# Patient Record
Sex: Female | Born: 1945 | Race: White | Hispanic: No | Marital: Married | Smoking: Former smoker
Health system: Southern US, Community
[De-identification: ages and names within clinical notes are randomized; demographics above are authoritative.]

## PROBLEM LIST (undated history)

## (undated) DIAGNOSIS — I10 Essential (primary) hypertension: Secondary | ICD-10-CM

## (undated) DIAGNOSIS — K5900 Constipation, unspecified: Secondary | ICD-10-CM

## (undated) DIAGNOSIS — T8859XA Other complications of anesthesia, initial encounter: Secondary | ICD-10-CM

## (undated) DIAGNOSIS — T4145XA Adverse effect of unspecified anesthetic, initial encounter: Secondary | ICD-10-CM

## (undated) DIAGNOSIS — R630 Anorexia: Secondary | ICD-10-CM

## (undated) DIAGNOSIS — K469 Unspecified abdominal hernia without obstruction or gangrene: Secondary | ICD-10-CM

## (undated) DIAGNOSIS — M1990 Unspecified osteoarthritis, unspecified site: Secondary | ICD-10-CM

## (undated) DIAGNOSIS — M62838 Other muscle spasm: Secondary | ICD-10-CM

## (undated) DIAGNOSIS — K219 Gastro-esophageal reflux disease without esophagitis: Secondary | ICD-10-CM

## (undated) DIAGNOSIS — R350 Frequency of micturition: Secondary | ICD-10-CM

## (undated) DIAGNOSIS — R51 Headache: Secondary | ICD-10-CM

## (undated) DIAGNOSIS — E039 Hypothyroidism, unspecified: Secondary | ICD-10-CM

## (undated) DIAGNOSIS — C801 Malignant (primary) neoplasm, unspecified: Secondary | ICD-10-CM

## (undated) DIAGNOSIS — Z87442 Personal history of urinary calculi: Secondary | ICD-10-CM

## (undated) DIAGNOSIS — L539 Erythematous condition, unspecified: Secondary | ICD-10-CM

## (undated) DIAGNOSIS — Z974 Presence of external hearing-aid: Secondary | ICD-10-CM

## (undated) HISTORY — DX: Malignant (primary) neoplasm, unspecified: C80.1

## (undated) HISTORY — DX: Other muscle spasm: M62.838

## (undated) HISTORY — PX: CHOLECYSTECTOMY: SHX55

## (undated) HISTORY — PX: COLONOSCOPY W/ POLYPECTOMY: SHX1380

## (undated) HISTORY — PX: HERNIA REPAIR: SHX51

## (undated) HISTORY — DX: Anorexia: R63.0

## (undated) HISTORY — DX: Essential (primary) hypertension: I10

## (undated) HISTORY — DX: Frequency of micturition: R35.0

## (undated) HISTORY — PX: APPENDECTOMY: SHX54

## (undated) HISTORY — DX: Unspecified osteoarthritis, unspecified site: M19.90

## (undated) HISTORY — DX: Erythematous condition, unspecified: L53.9

## (undated) HISTORY — PX: TONSILLECTOMY: SUR1361

## (undated) HISTORY — DX: Unspecified abdominal hernia without obstruction or gangrene: K46.9

## (undated) HISTORY — DX: Presence of external hearing-aid: Z97.4

## (undated) HISTORY — PX: TOE SURGERY: SHX1073

## (undated) HISTORY — PX: ABDOMINAL HYSTERECTOMY: SHX81

---

## 2004-08-27 ENCOUNTER — Emergency Department (HOSPITAL_COMMUNITY): Admission: EM | Admit: 2004-08-27 | Discharge: 2004-08-27 | Payer: Self-pay | Admitting: Emergency Medicine

## 2009-01-14 ENCOUNTER — Encounter: Admission: RE | Admit: 2009-01-14 | Discharge: 2009-03-25 | Payer: Self-pay | Admitting: Rheumatology

## 2009-05-07 ENCOUNTER — Emergency Department (HOSPITAL_COMMUNITY): Admission: EM | Admit: 2009-05-07 | Discharge: 2009-05-07 | Payer: Self-pay | Admitting: Emergency Medicine

## 2010-06-16 LAB — CBC
HCT: 46 % (ref 36.0–46.0)
Hemoglobin: 15.5 g/dL — ABNORMAL HIGH (ref 12.0–15.0)
MCV: 92.7 fL (ref 78.0–100.0)

## 2010-06-16 LAB — LACTIC ACID, PLASMA: Lactic Acid, Venous: 2.5 mmol/L — ABNORMAL HIGH (ref 0.5–2.2)

## 2010-06-16 LAB — POCT I-STAT, CHEM 8
BUN: 12 mg/dL (ref 6–23)
Chloride: 103 mEq/L (ref 96–112)
HCT: 49 % — ABNORMAL HIGH (ref 36.0–46.0)

## 2010-06-16 LAB — DIFFERENTIAL
Basophils Relative: 0 % (ref 0–1)
Eosinophils Absolute: 0.3 10*3/uL (ref 0.0–0.7)
Eosinophils Relative: 2 % (ref 0–5)
Lymphocytes Relative: 21 % (ref 12–46)
Lymphs Abs: 2.6 10*3/uL (ref 0.7–4.0)
Neutro Abs: 9.2 10*3/uL — ABNORMAL HIGH (ref 1.7–7.7)

## 2010-08-30 ENCOUNTER — Emergency Department (HOSPITAL_COMMUNITY): Payer: Medicare Other

## 2010-08-30 ENCOUNTER — Emergency Department (HOSPITAL_COMMUNITY)
Admission: EM | Admit: 2010-08-30 | Discharge: 2010-08-30 | Disposition: A | Discharge status: Home / Self Care | Payer: Medicare Other | Attending: Emergency Medicine | Admitting: Emergency Medicine

## 2010-08-30 DIAGNOSIS — R112 Nausea with vomiting, unspecified: Secondary | ICD-10-CM | POA: Insufficient documentation

## 2010-08-30 DIAGNOSIS — K439 Ventral hernia without obstruction or gangrene: Secondary | ICD-10-CM | POA: Insufficient documentation

## 2010-08-30 DIAGNOSIS — Z8543 Personal history of malignant neoplasm of ovary: Secondary | ICD-10-CM | POA: Insufficient documentation

## 2010-08-30 DIAGNOSIS — Z90710 Acquired absence of both cervix and uterus: Secondary | ICD-10-CM | POA: Insufficient documentation

## 2010-08-30 DIAGNOSIS — R109 Unspecified abdominal pain: Secondary | ICD-10-CM | POA: Insufficient documentation

## 2010-08-30 DIAGNOSIS — I10 Essential (primary) hypertension: Secondary | ICD-10-CM | POA: Insufficient documentation

## 2010-08-30 DIAGNOSIS — Z9089 Acquired absence of other organs: Secondary | ICD-10-CM | POA: Insufficient documentation

## 2010-08-31 ENCOUNTER — Emergency Department (HOSPITAL_COMMUNITY): Payer: Medicare Other

## 2010-08-31 ENCOUNTER — Inpatient Hospital Stay (HOSPITAL_COMMUNITY)
Admission: EM | Admit: 2010-08-31 | Discharge: 2010-09-03 | DRG: 395 | Disposition: A | Discharge status: Home / Self Care | Payer: Medicare Other | Attending: General Surgery | Admitting: General Surgery

## 2010-08-31 ENCOUNTER — Inpatient Hospital Stay (HOSPITAL_COMMUNITY): Payer: Medicare Other

## 2010-08-31 DIAGNOSIS — Z7982 Long term (current) use of aspirin: Secondary | ICD-10-CM

## 2010-08-31 DIAGNOSIS — K436 Other and unspecified ventral hernia with obstruction, without gangrene: Principal | ICD-10-CM | POA: Diagnosis present

## 2010-08-31 DIAGNOSIS — Z8543 Personal history of malignant neoplasm of ovary: Secondary | ICD-10-CM

## 2010-08-31 DIAGNOSIS — E039 Hypothyroidism, unspecified: Secondary | ICD-10-CM | POA: Diagnosis present

## 2010-08-31 DIAGNOSIS — D72829 Elevated white blood cell count, unspecified: Secondary | ICD-10-CM | POA: Diagnosis present

## 2010-08-31 DIAGNOSIS — M3500 Sicca syndrome, unspecified: Secondary | ICD-10-CM | POA: Diagnosis present

## 2010-08-31 DIAGNOSIS — I10 Essential (primary) hypertension: Secondary | ICD-10-CM | POA: Diagnosis present

## 2010-08-31 DIAGNOSIS — E119 Type 2 diabetes mellitus without complications: Secondary | ICD-10-CM | POA: Diagnosis present

## 2010-08-31 DIAGNOSIS — Z79899 Other long term (current) drug therapy: Secondary | ICD-10-CM

## 2010-08-31 LAB — BASIC METABOLIC PANEL
CO2: 32 mEq/L (ref 19–32)
Calcium: 9.5 mg/dL (ref 8.4–10.5)
Creatinine, Ser: 0.74 mg/dL (ref 0.4–1.2)
GFR calc non Af Amer: >60 mL/min (ref >60)
Glucose, Bld: 130 mg/dL — ABNORMAL HIGH (ref 70–99)
Potassium: 3.9 mEq/L (ref 3.5–5.1)

## 2010-08-31 LAB — DIFFERENTIAL
Basophils Absolute: 0 10*3/uL (ref 0.0–0.1)
Eosinophils Absolute: 0 10*3/uL (ref 0.0–0.7)
Eosinophils Relative: 0 % (ref 0–5)
Lymphocytes Relative: 19 % (ref 12–46)
Monocytes Absolute: 1 10*3/uL (ref 0.1–1.0)
Monocytes Relative: 7 % (ref 3–12)
Neutro Abs: 9.8 10*3/uL — ABNORMAL HIGH (ref 1.7–7.7)

## 2010-08-31 LAB — CBC
HCT: 46 % (ref 36.0–46.0)
Platelets: 216 10*3/uL (ref 150–400)
RBC: 5.23 MIL/uL — ABNORMAL HIGH (ref 3.87–5.11)

## 2010-08-31 MED ORDER — IOHEXOL 300 MG/ML  SOLN
100.0000 mL | Freq: Once | INTRAMUSCULAR | Status: AC | PRN
  Administered 2010-08-31: 100 mL via INTRAVENOUS

## 2010-09-01 LAB — GLUCOSE, CAPILLARY
Glucose-Capillary: 92 mg/dL (ref 70–99)
Glucose-Capillary: 83 mg/dL (ref 70–99)

## 2010-09-01 LAB — CBC
Hemoglobin: 14.7 g/dL (ref 12.0–15.0)
RBC: 4.74 MIL/uL (ref 3.87–5.11)
WBC: 11.2 10*3/uL — ABNORMAL HIGH (ref 4.0–10.5)

## 2010-09-01 LAB — BASIC METABOLIC PANEL
CO2: 31 mEq/L (ref 19–32)
Calcium: 8.3 mg/dL — ABNORMAL LOW (ref 8.4–10.5)
Chloride: 100 mEq/L (ref 96–112)
Creatinine, Ser: 0.51 mg/dL (ref 0.4–1.2)

## 2010-09-02 ENCOUNTER — Other Ambulatory Visit (HOSPITAL_COMMUNITY): Payer: Medicare Other

## 2010-09-02 LAB — GLUCOSE, CAPILLARY
Glucose-Capillary: 82 mg/dL (ref 70–99)
Glucose-Capillary: 93 mg/dL (ref 70–99)
Glucose-Capillary: 95 mg/dL (ref 70–99)

## 2010-09-03 LAB — GLUCOSE, CAPILLARY

## 2010-09-08 ENCOUNTER — Inpatient Hospital Stay (HOSPITAL_COMMUNITY)
Admission: RE | Admit: 2010-09-08 | Discharge: 2010-09-15 | DRG: 354 | Disposition: A | Discharge status: Home / Self Care | Payer: Medicare Other | Source: Ambulatory Visit | Attending: Surgery | Admitting: Surgery

## 2010-09-08 DIAGNOSIS — I2510 Atherosclerotic heart disease of native coronary artery without angina pectoris: Secondary | ICD-10-CM | POA: Diagnosis present

## 2010-09-08 DIAGNOSIS — E119 Type 2 diabetes mellitus without complications: Secondary | ICD-10-CM | POA: Diagnosis present

## 2010-09-08 DIAGNOSIS — K430 Incisional hernia with obstruction, without gangrene: Principal | ICD-10-CM | POA: Diagnosis present

## 2010-09-08 DIAGNOSIS — K560 Paralytic ileus: Secondary | ICD-10-CM | POA: Diagnosis not present

## 2010-09-08 DIAGNOSIS — Z452 Encounter for adjustment and management of vascular access device: Secondary | ICD-10-CM

## 2010-09-08 DIAGNOSIS — I10 Essential (primary) hypertension: Secondary | ICD-10-CM | POA: Diagnosis present

## 2010-09-08 DIAGNOSIS — I252 Old myocardial infarction: Secondary | ICD-10-CM

## 2010-09-08 DIAGNOSIS — Z8543 Personal history of malignant neoplasm of ovary: Secondary | ICD-10-CM

## 2010-09-08 LAB — GLUCOSE, CAPILLARY: Glucose-Capillary: 115 mg/dL — ABNORMAL HIGH (ref 70–99)

## 2010-09-08 LAB — COMPREHENSIVE METABOLIC PANEL
Alkaline Phosphatase: 62 U/L (ref 39–117)
BUN: 5 mg/dL — ABNORMAL LOW (ref 6–23)
CO2: 29 mEq/L (ref 19–32)
Chloride: 102 mEq/L (ref 96–112)
Glucose, Bld: 111 mg/dL — ABNORMAL HIGH (ref 70–99)
Potassium: 3.4 mEq/L — ABNORMAL LOW (ref 3.5–5.1)
Total Bilirubin: 1.2 mg/dL (ref 0.3–1.2)

## 2010-09-08 LAB — DIFFERENTIAL
Basophils Absolute: 0 10*3/uL (ref 0.0–0.1)
Lymphocytes Relative: 33 % (ref 12–46)
Monocytes Absolute: 0.7 10*3/uL (ref 0.1–1.0)
Neutro Abs: 3.7 10*3/uL (ref 1.7–7.7)

## 2010-09-08 LAB — CBC
HCT: 44.2 % (ref 36.0–46.0)
Hemoglobin: 14.9 g/dL (ref 12.0–15.0)
RBC: 4.91 MIL/uL (ref 3.87–5.11)
WBC: 7 10*3/uL (ref 4.0–10.5)

## 2010-09-08 LAB — SURGICAL PCR SCREEN: MRSA, PCR: NEGATIVE

## 2010-09-09 LAB — COMPREHENSIVE METABOLIC PANEL
ALT: 16 U/L (ref 0–35)
Albumin: 2.8 g/dL — ABNORMAL LOW (ref 3.5–5.2)
Alkaline Phosphatase: 48 U/L (ref 39–117)
BUN: 10 mg/dL (ref 6–23)
Chloride: 100 mEq/L (ref 96–112)
Potassium: 3.8 mEq/L (ref 3.5–5.1)
Sodium: 139 mEq/L (ref 135–145)
Total Bilirubin: 0.7 mg/dL (ref 0.3–1.2)

## 2010-09-09 LAB — GLUCOSE, CAPILLARY
Glucose-Capillary: 123 mg/dL — ABNORMAL HIGH (ref 70–99)
Glucose-Capillary: 124 mg/dL — ABNORMAL HIGH (ref 70–99)
Glucose-Capillary: 147 mg/dL — ABNORMAL HIGH (ref 70–99)

## 2010-09-09 LAB — CBC
HCT: 41.6 % (ref 36.0–46.0)
Hemoglobin: 13 g/dL (ref 12.0–15.0)
RDW: 15 % (ref 11.5–15.5)
WBC: 13 10*3/uL — ABNORMAL HIGH (ref 4.0–10.5)

## 2010-09-09 LAB — HEMOGLOBIN A1C
Hgb A1c MFr Bld: 6.3 % — ABNORMAL HIGH (ref <5.7)
Mean Plasma Glucose: 134 mg/dL — ABNORMAL HIGH (ref <117)

## 2010-09-09 NOTE — Op Note (Signed)
Stacy Kirk, Stacy Kirk            ACCOUNT NO.:  [id number]  MEDICAL RECORD NO.:  [id number]  LOCATION:  0003                         FACILITY:  [facility]  PHYSICIAN:   A. , M.D.DATE OF BIRTH:  07-10-1945  DATE OF PROCEDURE:  09/08/2010 DATE OF DISCHARGE:                              OPERATIVE REPORT   PREOPERATIVE DIAGNOSES: 1. 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 2. History of ovarian cancer with indwelling Port-A-Cath in right     subclavian region.  PROCEDURES: 1. Repair of complex recurrent incisional hernia with 20 cm x 20 cm     Strattice biologic mesh in an onlay position. 2. Myofascial advancement flap creation, total 20 cm. 3. Explantation of Port-A-Cath.  SURGEON:  Maisie Fus A. , MD  ASSISTANT:  Anselm Pancoast. Weatherly, MD  ESTIMATED BLOOD LOSS:  50 cc.  SPECIMENS:  None.  DRAINS:  19 round Blake drain to subcutaneous tissue under subcutaneous flaps.  INDICATIONS FOR PROCEDURE:  The patient is a 65 year old female who about 7 years ago had a laparotomy for ovarian cancer.  She has been disease free ever since.  She has developed multiple fascial defects and has been in and out of the emergency room with intermittent obstruction and incarceration of small bowel in these hernia defects.  She was last admitted to [facility] last week and this resolved nonoperatively.  Given her multiple history of hernias and incarcerations of these hernias with bowel obstruction, we recommend repair to her.  There are multiple fascial defects.  She is morbidly obese, had diabetes and has to take care of a son with disability who is an adult and there is a lot of lifting.  She is at high risk for recurrence, I told her that when I saw were given her multiple medical problems and her obesity.  Discussed options of laparoscopic versus open repair.  I felt an open repair would be the best for her given the fact that she had poor fascia and  multiple incisional defects as well as a history of an incarceration with bowel obstruction.  I discussed component separation techniques in order to reconstruct the midline and the use of a biological prosthesis given her poor tissue quality and that I felt a component separation would be best for her.  An open approach was also felt to be best given her large size of multiple fascial defects and poor tissue.  Risk of bleeding, infection, pulmonary complications of pneumonia, DVT, sepsis, intra- abdominal injury, colonic injury, small bowel injury, injury to stomach and other intra-abdominal organs were discussed as well as potential for bowel obstruction and recurrence of hernia even after repair.  I also discussed using a biological prosthesis as well with her upfront.  Did not feel laparoscopic was appropriate, I told her that.  She understood all the potential complications and wished to proceed.  She obtained medical clearance from Dr. Oneida Alar [facility].  Other complications, cardiovascular complications of stroke and myocardial infarction as well.  DESCRIPTION OF PROCEDURE:  The patient was brought to the operating room.  After induction of general anesthesia, a Foley catheter was placed.  Right subclavian region was then prepped and draped in a sterile  fashion.  Port-A-Cath located there.  After 3 minutes of allowing the prep to dry, incision was made into the old scar and the Port-A-Cath hub was identified.  It was grabbed and then the sutures were cut to release it from the chest wall.  We removed the entire Port- A-Cath without difficulty keeping my finger over the tract.  The port appeared to be removed in its entirety.  We then closed the wound with a layer of 3-0 Vicryl suture.  Dermabond was applied.  We then prepped and draped the abdomen separately.  Time-out was again done.  Midline incision was used.  Of note, a Foley catheter was placed and  preoperative antibiotics were also started.  We dissected down and a large hernia sac identified.  We entered abdominal cavity through the hernia sac.  She had significant intra-abdominal adhesions to the colon and small bowel into the hernia sac and there was a small adhesion that appeared to be responsible for obstruction.  We  took all this down with sharp dissection.  Once all adhesions were taken down, we noticed still some small bleeders along the antimesenteric border of the colon and oversewed these with sutures.  Hemostasis was excellent.  I then examined the abdominal wall.  She had multiple fascial defects noted down her entire incision.  I went ahead and felt that component separation would help me bring more of the abdominal wall back to the midline.  We then made skin flaps over the abdominal wall musculature all the way lateral as far as I could get, which was to the level of the just lateral to the anterior superior iliac spine.  Once we got this lateral, I preserved at least 1 perforator on each side to keep the skin alive.  I then used a cautery and made a slicing incision into the external oblique aponeurosis from basically the costal margin down to level of pubic symphysis.  This was on both sides.  This helped to release, I gained about 2 cm on each side, I was able pull the midline together.  I elected to go ahead and close this, it came together very easily with no tension using double-stranded #1 PDS.  Then I used a 20 x 20 cm piece of Strattice mesh in a diamond shape and then secured to the cut edge of the external oblique laterally and then took it superiorly above the old scar and secured it to what appeared be healthy fascia of the midline and did this again over the pubic symphysis as well.  This laid quite nicely.  I then irrigated out the subcutaneous tissues, used 2 separate stab incisions, 19 round Blake drains were placed.  This was secured to the skin  with 2-0 nylon.  We then approximated the subcutaneous fat with 3-0 Vicryl.  Skin staples were used to close the skin.  All final counts of sponge, needle, and instrument were found to be correct.  The patient was awoken, extubated, and taken to recovery room in satisfactory condition.      A. , M.D.     TAC/MEDQ  D:  09/08/2010  T:  09/08/2010  Job:  [id number]  cc:   Dr. [facility]  Electronically Signed by Harriette Bouillon M.D. on 09/09/2010 02:39:30 PM

## 2010-09-10 LAB — CBC
HCT: 39 % (ref 36.0–46.0)
Hemoglobin: 12.5 g/dL (ref 12.0–15.0)
MCH: 30.1 pg (ref 26.0–34.0)
MCV: 94 fL (ref 78.0–100.0)
RBC: 4.15 MIL/uL (ref 3.87–5.11)
WBC: 13.2 10*3/uL — ABNORMAL HIGH (ref 4.0–10.5)

## 2010-09-10 LAB — COMPREHENSIVE METABOLIC PANEL
BUN: 6 mg/dL (ref 6–23)
CO2: 36 mEq/L — ABNORMAL HIGH (ref 19–32)
Calcium: 9.1 mg/dL (ref 8.4–10.5)
Chloride: 99 mEq/L (ref 96–112)
Creatinine, Ser: <0.47 mg/dL — ABNORMAL LOW (ref 0.50–1.10)
Glucose, Bld: 144 mg/dL — ABNORMAL HIGH (ref 70–99)
Total Bilirubin: 1 mg/dL (ref 0.3–1.2)

## 2010-09-10 LAB — GLUCOSE, CAPILLARY
Glucose-Capillary: 142 mg/dL — ABNORMAL HIGH (ref 70–99)
Glucose-Capillary: 117 mg/dL — ABNORMAL HIGH (ref 70–99)

## 2010-09-11 LAB — GLUCOSE, CAPILLARY
Glucose-Capillary: 119 mg/dL — ABNORMAL HIGH (ref 70–99)
Glucose-Capillary: 123 mg/dL — ABNORMAL HIGH (ref 70–99)
Glucose-Capillary: 128 mg/dL — ABNORMAL HIGH (ref 70–99)
Glucose-Capillary: 129 mg/dL — ABNORMAL HIGH (ref 70–99)

## 2010-09-12 LAB — GLUCOSE, CAPILLARY
Glucose-Capillary: 126 mg/dL — ABNORMAL HIGH (ref 70–99)
Glucose-Capillary: 113 mg/dL — ABNORMAL HIGH (ref 70–99)
Glucose-Capillary: 114 mg/dL — ABNORMAL HIGH (ref 70–99)

## 2010-09-13 LAB — GLUCOSE, CAPILLARY
Glucose-Capillary: 120 mg/dL — ABNORMAL HIGH (ref 70–99)
Glucose-Capillary: 94 mg/dL (ref 70–99)

## 2010-09-14 LAB — CBC
HCT: 39.5 % (ref 36.0–46.0)
Platelets: 279 10*3/uL (ref 150–400)
RBC: 4.34 MIL/uL (ref 3.87–5.11)
RDW: 14.2 % (ref 11.5–15.5)
WBC: 6.1 10*3/uL (ref 4.0–10.5)

## 2010-09-14 LAB — GLUCOSE, CAPILLARY

## 2010-09-14 NOTE — Discharge Summary (Signed)
NAMECAITLEN, WORTH NO.:  [id number]  MEDICAL RECORD NO.:  [id number]  LOCATION:  5158                         FACILITY:  [facility]  PHYSICIAN:  Cherylynn Ridges, M.D.    DATE OF BIRTH:  Apr 07, 1945  DATE OF ADMISSION:  08/31/2010 DATE OF DISCHARGE:  09/03/2010                              DISCHARGE SUMMARY   ADMITTING PHYSICIAN:  Cherylynn Ridges, MD.  DISCHARGING PHYSICIAN:  Cherylynn Ridges, MD.  PRIMARY SURGEON:  Clovis Pu. Cornett, MD.  PRIMARY CARE PHYSICIAN:  Dr. Katrinka Blazing at [facility].  PROCEDURES:  None.  CONSULTANTS:  None.  REASON FOR ADMISSION:  Ms. Hoefling is a 65 year old female who has a known complex ventral hernia.  This was scheduled to be fixed this coming Wednesday.  However, the patient began having abdominal pain, nausea, and vomiting prior to this admission.  The patient was initially seen at [facility] where she had an abdominal x-ray, but was informed that was okay, and was sent home.  The x-ray did show some central bowel dilation.  When the patient returns home, her nausea and vomiting continued as well as her pain.  She presented back to [facility] emergency department where we were asked to see her.  A CT scan was obtained which revealed a partial obstruction of her complex ventral hernia.  At this time, the patient was admitted.  Please see admitting history and physical for further details.  ADMITTING DIAGNOSES: 1. Incarcerated likely strangulated complex ventral hernia with     possible compromise bowel. 2. Hypertension. 3. Leukocytosis. 4. Sjogren's syndrome. 5. History of ovarian cancer. 6. History of diabetes mellitus.  HOSPITAL COURSE:  At this time, the patient was admitted.  She was started on IV fluids as well as IV Invanz given the appearance of her abdomen, which included erythema surrounding her hernia as well as her tenderness and possibility of compromise bowel.  An NG tube was also placed for  decompression.  After the NG tube was placed, the patient did have some decompression, and by the following day, the patient was feeling much better.  She was passing gas at this time and her pain was much improved.  Her white blood cell count decreased to 11,200.  At this time, her NG tube was continued for further decompression.  The following day, the patient was continuing to feel better with still flatus.  Her abdomen was soft and her hernia was almost essentially reducible but was then pop back out.  Therefore, it was known that the patient's hernia was no longer completely incarcerated.  At this time because of decreased NG tube output, the patient's NG tube was clamped for 6 hours.  She did not have any nausea or vomiting with this and was then discontinued.  She was started on clear liquids and her diet was advanced as tolerated.  On June 8, the patient as long as she tolerates a regular low residue diet for lunch.  The patient may be discharged home with follow-up next week for her elective ventral hernia repair by Dr. Luisa Hart.  DISCHARGE DIAGNOSES: 1. Partially incarcerated complex ventral hernia resulting in a     partial small bowel  obstruction which has now resolved. 2. Diabetes mellitus. 3. Hypertension. 4. Hypothyroidism. 5. Sjogren's syndrome. 6. History of ovarian cancer.  DISCHARGE MEDICATIONS:  Please see medication reconciliation form.  DISCHARGE INSTRUCTIONS:  I will call the office and informed them they need to call the patient and give her further updated details on all of her preoperative information as she has apparently lost her preoperative packet.  Otherwise, I have informed the patient that she should continue with a normal softer low residue diet until the time of her surgery.  If she begins to have recurrent symptoms, she should give our office to call.     Letha Cape, PA   ______________________________ Cherylynn Ridges,  M.D.    KEO/MEDQ  D:  09/03/2010  T:  09/03/2010  Job:  [id number]  cc:   Dr. Jettie Pagan A. Cornett, M.D.  Electronically Signed by Barnetta Chapel PA on 09/09/2010 10:32:13 AM Electronically Signed by Jimmye Norman M.D. on 09/14/2010 08:16:31 AM

## 2010-09-14 NOTE — H&P (Signed)
[name], Kirk            ACCOUNT NO.:  [id number]  MEDICAL RECORD NO.:  [id number]  LOCATION:  5158                         FACILITY:  [facility]  PHYSICIAN:  Cherylynn Ridges, M.D.    DATE OF BIRTH:  March 18, 1946  DATE OF ADMISSION:  08/31/2010 DATE OF DISCHARGE:                             HISTORY & PHYSICAL   ADMITTING PHYSICIAN:  Marta Lamas. Lindie Spruce, MD  PRIMARY CARE PHYSICIAN:  Dr. Katrinka Blazing at [facility].  PRIMARY SURGEONS:  Maisie Fus A. Cornett, MD  CHIEF COMPLAINT:  Abdominal pain with nausea and vomiting.  HISTORY OF PRESENT ILLNESS:  Stacy Kirk is a 65 year old white female with a past medical history including hypertension, ovarian cancer, Sjogren syndrome, and diabetes mellitus.  However, type is somewhat unknown as the patient states she just got diagnosed with type 1 diabetes 2 years ago at the age of 62, but takes no treatment for it, and is adamant that she definitely does not have type 2 diabetes.  She does have a known complex ventral hernia.  It is likely incisional in nature after her laparotomy for her hysterectomy for ovarian cancer. The patient has seen Dr. Luisa Hart in the office last year as well as this year for the hernia.  She is actually scheduled for hernia repair next week.  However, Sunday night she awoke with severe abdominal pain, nausea, and vomiting.  Since then, she states she has essentially had intractable nausea and vomiting.  She went to the emergency department at [facility].  She had no labs completed and an abdominal x- ray.  This did show central small bowel dilatation, but no further workup was completed.  The patient was sent home and told to return for worsening nausea or vomiting or pain.  The patient's symptoms persisted. She called our office this morning who instructed her to come back to the emergency department, and we will evaluate her here.  Upon arrival, the patient complains of severe abdominal pain and  unrelenting nausea. She has an elevated white blood cell count of 13,500.  Currently, a CT scan is pending.  We will plan on admission for possible surgical repair.  REVIEW OF SYSTEMS:  Please see HPI, otherwise the patient admits to very little flatus, and her last bowel movement was on Sunday.  Otherwise, she admits to occasional short of breath with walking long distances, but no other dyspnea or chest pain.  All other systems have been reviewed and are negative.  FAMILY HISTORY:  Noncontributory.  PAST MEDICAL HISTORY: 1. Hypertension. 2. Ovarian cancer. 3. Diabetes mellitus, unsure whether this actually type 1 or type 2. 4. Sjogren syndrome.  PAST SURGICAL HISTORY: 1. Total abdominal hysterectomy. 2. Cholecystectomy.  SOCIAL HISTORY:  The patient is married with two children.  Her son is completely handicapped.  She denies any alcohol or tobacco, but she quit approximately 20 years ago.  ALLERGIES: 1. CODEINE 2. ASPIRIN. 3. ERYTHROMYCIN.  MEDICATIONS:  Currently unknown.  Her husband has her list of medications and he is at home.  PHYSICAL EXAMINATION:  GENERAL:  Stacy Kirk is a pleasant 65 year old obese white female who is currently lying in bed in mild-to-moderate distress secondary to pain and nausea.  VITAL SIGNS:  Temperature 98.3, pulse 73, respirations 18, blood pressure 155/76. HEENT:  Head is normocephalic, atraumatic.  Sclerae noninjected.  Pupils are equal, round, and reactive to light.  Ears and nose without any obvious masses or lesions.  No rhinorrhea.  Mouth is pink.  Throat shows no exudate. HEART:  Regular rate and rhythm.  Normal S1 and S2.  No murmurs, gallops, or rubs are noted.  She does have palpable carotid, radial, and pedal pulses bilaterally. LUNGS:  Clear to auscultation bilaterally with no wheezes, rhonchi, or rales noted.  Respiratory effort is nonlabored. ABDOMEN:  Soft, however, she is very tender over her incisional hernia. Bowel  is palpable.  She does have active bowel sounds.  There are erythematous changes located over this hernia.  No other masses are noted. MUSCULOSKELETAL:  All four extremities are symmetrical with no cyanosis, clubbing, or edema. SKIN:  Warm and dry with no mass lesions or rashes. PSYCH:  The patient is alert and oriented x3 with an appropriate affect.  LABORATORY DATA AND DIAGNOSTICS:  Sodium 132, potassium 3.9, glucose 130, BUN 18, creatinine 0.74.  White blood cell count is 13,500, hemoglobin 16.3, hematocrit 46, platelet count of 216,000.  CT scan of the abdomen and pelvis is pending.  Preoperative chest x-ray is pending.  IMPRESSION: 1. Incarcerated likely strangulated complex ventral hernia, possible     compromised bowel. 2. Hypertension. 3. Leukocytosis. 4. Sjogren syndrome. 5. History of ovarian cancer. 6. History of diabetes mellitus, type unknown.  PLAN:  We will get the patient admitted.  We will start on IV fluids as well as various p.r.n. medications as needed for pain and nausea.  Given her white blood cell count and early erythematous changes surrounding her hernia, we will also go ahead and start her on Invanz 1 g for the possibility of compromise bowel.  We will get a CT scan of the abdomen and pelvis to evaluate this hernia for further clarification.  I have explained to the patient that she may need emergent or urgent repair at this hospitalization, possibly even tonight given the CT scan findings. I have also discussed with the patient that she may very well likely need an NG tube placed for gastric decompression.  The patient understands all this.  In the meantime, we will await her diagnostic tests for further plan.  We will place her in a regular floor bed with a SCDs and hold on DVT prophylaxis in case she needs emergent surgical intervention.  We will get a preoperative EKG and chest x-ray as the patient does complain of some dyspnea on  exertion.     Letha Cape, PA   ______________________________ Cherylynn Ridges, M.D.    KEO/MEDQ  D:  08/31/2010  T:  09/01/2010  Job:  [id number]  cc:   [facility] Dr. Katrinka Blazing at [facility]  Electronically Signed by Barnetta Chapel PA on 09/01/2010 04:08:37 PM Electronically Signed by Jimmye Norman M.D. on 09/14/2010 08:10:14 AM

## 2010-09-15 LAB — GLUCOSE, CAPILLARY: Glucose-Capillary: 106 mg/dL — ABNORMAL HIGH (ref 70–99)

## 2010-09-24 ENCOUNTER — Ambulatory Visit (INDEPENDENT_AMBULATORY_CARE_PROVIDER_SITE_OTHER): Payer: Medicare Other | Admitting: Surgery

## 2010-09-24 ENCOUNTER — Encounter (INDEPENDENT_AMBULATORY_CARE_PROVIDER_SITE_OTHER): Payer: Self-pay | Admitting: Surgery

## 2010-09-24 DIAGNOSIS — K432 Incisional hernia without obstruction or gangrene: Secondary | ICD-10-CM

## 2010-09-24 NOTE — Progress Notes (Signed)
Subjective:     Patient ID: Stacy Kirk, female   DOB: [birth year], 65 y.o.   MRN: [id number]    There were no vitals taken for this visit.    HPI  The patient returns.  She is doing well.   Review of Systems negative     Objective:   Physical Exam  Incisions clean dry intact to abdomen.  Staples removed.  Small seroma.     Assessment:   Status post repair incisional hernia  Plan:   Follow up 2 weeks

## 2010-09-24 NOTE — Patient Instructions (Signed)
Return in two weeks.  

## 2010-09-30 ENCOUNTER — Telehealth (INDEPENDENT_AMBULATORY_CARE_PROVIDER_SITE_OTHER): Payer: Self-pay | Admitting: Surgery

## 2010-09-30 NOTE — Telephone Encounter (Signed)
I called pt's home # [phone] and message box is full.

## 2010-10-02 NOTE — Discharge Summary (Signed)
  NAMEETHELEAN, COLLA            ACCOUNT NO.:  [id number]  MEDICAL RECORD NO.:  [id number]  LOCATION:  1535                         FACILITY:  [facility]  PHYSICIAN:   A. , M.D.DATE OF BIRTH:  1945-10-25  DATE OF ADMISSION:  09/08/2010 DATE OF DISCHARGE:  09/15/2010                              DISCHARGE SUMMARY   ADMISSION DIAGNOSIS:  Complex ventral hernia with history of small-bowel obstruction.  DISCHARGE DIAGNOSES:  Complex ventral hernia with history of small-bowel obstruction.  PROCEDURE PERFORMED:  Repair of complex incisional hernia with component separation, biologic mesh.  BRIEF HISTORY:  The patient is a 65 year old female with a past history of ovarian cancer.  She was admitted due to a complex ventral hernias. She had multiple problems with obstruction and incarceration over the last 2 years.  HOSPITAL COURSE:  Please see operative note for details.  Ms. Ringel was placed in the stepdown unit on postop day #0.  Postop day #1 she was transferred to floor.  NG tube remained in place.  Bowel function returned the next 2 days and NG tube was removed.  She was on Entereg. Her wounds were clean, dry, intact.  JP drains had serous drainage.  She began to ambulate.  She had no fever or chills.  Her bowel function returned.  By postoperative day #7, she was tolerating regular diet, was ambulating well with good pain control with p.o. pain medications.  She was then discharged home in satisfactory condition.  DISCHARGE INSTRUCTIONS:  She will follow up in 1 to 2 weeks to have her staples removed.  Her JP drain will remain in place.  She will refrain from lifting, pushing, pulling and driving until she is seen back in our office in 1 to 2 weeks.  She will be sent home on Percocet 1 or 2 tablets q.4 p.r.n. pain and will resume her home medications as outlined in medical reconciliation list of discharge.  She will shower.  CONDITION ON DISCHARGE:   Improved.  DISPOSITION:  Discharged home.      A. , M.D.     TAC/MEDQ  D:  09/24/2010  T:  09/24/2010  Job:  [id number]  Electronically Signed by Harriette Bouillon M.D. on 10/02/2010 05:14:10 PM

## 2010-10-04 ENCOUNTER — Encounter (INDEPENDENT_AMBULATORY_CARE_PROVIDER_SITE_OTHER): Payer: Self-pay | Admitting: Surgery

## 2010-10-04 ENCOUNTER — Ambulatory Visit (INDEPENDENT_AMBULATORY_CARE_PROVIDER_SITE_OTHER): Payer: Medicare Other | Admitting: Surgery

## 2010-10-04 DIAGNOSIS — Z9889 Other specified postprocedural states: Secondary | ICD-10-CM

## 2010-10-04 NOTE — Progress Notes (Signed)
Subjective:     Patient ID: Stacy BARTOLOTTA, female   DOB: [birth year], 65 y.o.   MRN: [id number]    There were no vitals taken for this visit.    HPI The patient returns today for a wound check. She has had a small amount of skin separation along the lower aspect of her incision. She also reports some drainage from the incision. There is no history of fever or chills. Drainage screen.  Review of Systems negative    Objective:  Physical Exam  Constitutional: She appears well-developed and well-nourished.  Abdominal:       Wound with a small amount of separation inferiorly.  Some. minimal serous drainage noted.  Seroma noted.      Assessment:  Status post incisional hernia repair using component separation and biologic mesh with small amount of skin separation residual seromaPlan:  Return to clinic one week

## 2010-10-04 NOTE — Patient Instructions (Signed)
Follow up in 1 week.  Keep area clean with soap and water.  Add neosporin.  It may drain.  This is expected.

## 2010-10-08 ENCOUNTER — Encounter (INDEPENDENT_AMBULATORY_CARE_PROVIDER_SITE_OTHER): Payer: Medicare Other | Admitting: Surgery

## 2010-10-12 ENCOUNTER — Encounter (INDEPENDENT_AMBULATORY_CARE_PROVIDER_SITE_OTHER): Payer: Medicare Other | Admitting: Surgery

## 2010-10-13 ENCOUNTER — Ambulatory Visit (INDEPENDENT_AMBULATORY_CARE_PROVIDER_SITE_OTHER): Payer: Medicare Other | Admitting: General Surgery

## 2010-10-13 ENCOUNTER — Encounter (INDEPENDENT_AMBULATORY_CARE_PROVIDER_SITE_OTHER): Payer: Self-pay | Admitting: General Surgery

## 2010-10-13 VITALS — BP 144/92 | HR 76 | Temp 97.0°F | Ht 61.0 in | Wt 193.4 lb

## 2010-10-13 DIAGNOSIS — K645 Perianal venous thrombosis: Secondary | ICD-10-CM | POA: Insufficient documentation

## 2010-10-13 MED ORDER — HYDROCORTISONE 2.5 % RE CREA: TOPICAL_CREAM | Freq: Two times a day (BID) | RECTAL | Status: AC

## 2010-10-13 NOTE — Progress Notes (Signed)
Subjective:     Patient ID: Stacy Kirk, female   DOB: 1945/06/27, 65 y.o.   MRN: [id number]  HPI Pt complains of 2-3 day history of burning around anus and blood when she wipes.  She can feel "something down there" that is not normal.  She has seen some clots and dripping of blood into the toilet.  She has never had trouble with hemorrhoids before.  She has been having issues with constipation since she was an infant.  She takes Miralax, stool softeners, prunes/prune juice and oatmeal to have stools.  She drinks quite a bit of water.  She does not take fiber supplements.    Review of Systems Otherwise negative.  Doing well from hernia repair.  No fevers/chills. Tylenol only for pain.    Objective:   Physical Exam  Constitutional: She appears well-developed and well-nourished.  HENT:  Head: Normocephalic and atraumatic.  Mouth/Throat: Oropharynx is clear and moist. No oropharyngeal exudate.  Eyes: Conjunctivae are normal. Pupils are equal, round, and reactive to light. No scleral icterus.  Neck: Normal range of motion. Neck supple.  Cardiovascular: Normal rate.   Pulmonary/Chest: Effort normal. No respiratory distress.  Abdominal: There is tenderness.       seroma  Genitourinary: Rectal exam shows external hemorrhoid and internal hemorrhoid. Rectal exam shows no tenderness and anal tone normal.          Thrombosed hemorrhoid Internal hemorrhoids circumferential.  Musculoskeletal: Normal range of motion.  Skin: Skin is warm and dry. No rash noted. No erythema.  Psychiatric: She has a normal mood and affect. Her behavior is normal. Thought content normal.       Assessment:     Thrombosed external hemorrhoid    Plan:   No need to I&D clot since non tender. Warm water baths (sitz baths) 2 times/day. Stool softeners 2x/day Miralax daily Anusol cream Follow up prn.

## 2010-10-13 NOTE — Patient Instructions (Signed)
Warm water baths (sitz baths) 2 times/day. Stool softeners 2x/day Miralax daily Anusol cream Follow up prn.

## 2010-10-14 ENCOUNTER — Telehealth (INDEPENDENT_AMBULATORY_CARE_PROVIDER_SITE_OTHER): Payer: Self-pay | Admitting: Surgery

## 2010-11-04 ENCOUNTER — Encounter (INDEPENDENT_AMBULATORY_CARE_PROVIDER_SITE_OTHER): Payer: Self-pay | Admitting: Surgery

## 2010-11-04 ENCOUNTER — Other Ambulatory Visit (INDEPENDENT_AMBULATORY_CARE_PROVIDER_SITE_OTHER): Payer: Self-pay | Admitting: Surgery

## 2010-11-04 ENCOUNTER — Ambulatory Visit (INDEPENDENT_AMBULATORY_CARE_PROVIDER_SITE_OTHER): Payer: Medicare Other | Admitting: Surgery

## 2010-11-04 DIAGNOSIS — R109 Unspecified abdominal pain: Secondary | ICD-10-CM

## 2010-11-04 DIAGNOSIS — IMO0002 Reserved for concepts with insufficient information to code with codable children: Secondary | ICD-10-CM

## 2010-11-04 MED ORDER — DOXYCYCLINE HYCLATE 100 MG PO TABS: 100.0000 mg | ORAL_TABLET | Freq: Two times a day (BID) | ORAL | Status: AC

## 2010-11-04 NOTE — Progress Notes (Signed)
The patient returns today in followup. She is about 8 weeks out from a complex repair of complex recurrent incisional hernia with biologic mesh. She has missed at least one or 2 previous postop visits due to scheduling issues. She is not having much abdominal pain but hasd notice more redness around the incision. There is also fluid under the incision. Denies any fever or chills. She does have a poor appetite and suffers from constipation.  On examination her abdomen is soft. . There is some erythema along her lower abdomen that corresponds to the incision.  No fluctuance.  Fluid noted under the incision. This is not tender at all. Under sterile conditions I aspirated some fluid from the seroma and appeared to be serous sanguinous. No pus in this fluid. This was sent for culture. No evidence of recurrent hernia.  Impression: Status post repair of complex recurrent incisional hernia with biologic mesh and component separation with persistent postoperative seroma  Plan: I will send her for CT scan here in [place] further evaluation. She is having no significant pain which is encouraging. She does not have much of an appetite and is suffering from constipation to think explains her poor appetite. I've asked her to increase the amount of Dulcolax as she is taking to hopefully help her bowels move better. She is nausea or vomiting he does not appear to be obstructed. I will place her on doxycycline 100 mg p.o. b.i.d. The redness is not tender but could be secondary to local irritation. She will return after the CT scan is done in the next 7-10 days.

## 2010-11-04 NOTE — Patient Instructions (Signed)
You will be scheduled for a CT of the abdomen.  Try to increase dose of dulcolax to 2 tablets three times a day.  Will start doxycycline 100 mg by mouth  Twice a day. Cancel the CT that's already scheduled.

## 2010-11-07 LAB — WOUND CULTURE

## 2010-11-10 ENCOUNTER — Ambulatory Visit
Admission: RE | Admit: 2010-11-10 | Discharge: 2010-11-10 | Disposition: A | Discharge status: Home / Self Care | Payer: Medicare Other | Source: Ambulatory Visit | Attending: Surgery | Admitting: Surgery

## 2010-11-10 ENCOUNTER — Encounter (INDEPENDENT_AMBULATORY_CARE_PROVIDER_SITE_OTHER): Payer: Self-pay | Admitting: Surgery

## 2010-11-10 ENCOUNTER — Ambulatory Visit (INDEPENDENT_AMBULATORY_CARE_PROVIDER_SITE_OTHER): Payer: Medicare Other | Admitting: Surgery

## 2010-11-10 VITALS — Temp 96.7°F

## 2010-11-10 DIAGNOSIS — IMO0002 Reserved for concepts with insufficient information to code with codable children: Secondary | ICD-10-CM

## 2010-11-10 MED ORDER — IOHEXOL 300 MG/ML  SOLN: 100.0000 mL | Freq: Once | INTRAMUSCULAR | Status: AC | PRN

## 2010-11-10 MED ORDER — CIPROFLOXACIN HCL 500 MG PO TABS: 500.0000 mg | ORAL_TABLET | Freq: Two times a day (BID) | ORAL | Status: AC

## 2010-11-10 NOTE — Progress Notes (Signed)
The patient returns to clinic today. CT scan shows a large postoperative seroma. The fluid I drew from last week throughout a Staphylococcus aureus that appears pan sensitive except for penicillin. She is on doxycycline. She has  more redness along her incision. She denies any significant fever or chills.  The exam: The erythema around the incision is slightly worse than when I saw her last week. There is significant fluid noted the incision. I discussed placing a drain in the seroma collection today she is agreed to let me do that. I prepped the area with Betadine. One percent lidocaine was used for local anesthesia. A 1 cm incision was made just to the right incision at the level of the umbilicus. I used a hemostat to open the seroma cavity. Clear fluid returned. A 19 French Jackson-Pratt drain was placed without difficulty.  I. secured it  to the skin with 3-0 nylon.  Impression: Postoperative seroma growing Staphylococcus aureus after component separation ventral repair with biologic mesh status post placement of drain in office today  Plan: She is on doxycycline which this is sensitive to. I will have her finish the doxycycline and start cipro 500 mg po bid.  She will empty the drain daily and record the output.  Sehe will call if she develops fever or chills.

## 2010-11-10 NOTE — Patient Instructions (Signed)
Empty drain daily and record output.  Finish doxycycline then  start cipro 500 mg twice a day.  Ok to shower.  Call if your temperature is greater than 101.5 or severe myalgia.

## 2010-11-15 ENCOUNTER — Encounter (INDEPENDENT_AMBULATORY_CARE_PROVIDER_SITE_OTHER): Payer: Self-pay | Admitting: Surgery

## 2010-11-15 ENCOUNTER — Ambulatory Visit (INDEPENDENT_AMBULATORY_CARE_PROVIDER_SITE_OTHER): Payer: Medicare Other | Admitting: Surgery

## 2010-11-15 VITALS — BP 122/76 | HR 72 | Temp 96.7°F | Ht 61.0 in | Wt 182.4 lb

## 2010-11-15 DIAGNOSIS — IMO0002 Reserved for concepts with insufficient information to code with codable children: Secondary | ICD-10-CM

## 2010-11-15 DIAGNOSIS — N390 Urinary tract infection, site not specified: Secondary | ICD-10-CM

## 2010-11-15 NOTE — Progress Notes (Signed)
Subjective:     Patient ID: Stacy Kirk, female   DOB: 1945/06/23, 65 y.o.   MRN: [id number]  HPI  The patient comes today complaining of burning and urinary urgency. She notes that this is similar to urinary tract infections she's had the past. This is been going on for about a week. She thought maybe was related to the seroma at the hernia. She held off on mentioning anything until it was drained first. It is not better on the doxycycline.   The patient notes that since the drain was placed by Dr. Luisa Hart for the seroma last week, the redness has gone away.  The drainage output is gone down from over 100 mL today to around 25-50 mL a day. It is also thinner and less cloudy. She denies pain. She denies any drainage elsewhere along her incision. Overall she's feeling well. No diarrhea. She was struggling with tolerating the doxycycline on empty stomach. She takes it with a little bit of bread & ttat helps.   Review of Systems  Constitutional: Negative for fever, chills, diaphoresis, appetite change and fatigue.  HENT: Negative for ear pain, sore throat, trouble swallowing, neck pain and ear discharge.   Eyes: Negative for photophobia, discharge and visual disturbance.  Respiratory: Negative for cough, choking, chest tightness and shortness of breath.   Cardiovascular: Negative for chest pain and palpitations.  Gastrointestinal: Negative for nausea, vomiting, abdominal pain, diarrhea, constipation, anal bleeding and rectal pain.  Genitourinary: Positive for urgency, frequency and enuresis. Negative for decreased urine volume, vaginal bleeding, vaginal discharge, vaginal pain and pelvic pain.  Musculoskeletal: Negative for myalgias and gait problem.  Skin: Negative for color change, pallor and rash.  Neurological: Negative for dizziness, speech difficulty, weakness and numbness.  Hematological: Negative for adenopathy.  Psychiatric/Behavioral: Negative for confusion and agitation. The  patient is not nervous/anxious.        Objective:   Physical Exam  Constitutional: She appears well-developed and well-nourished. No distress.  HENT:  Head: Normocephalic.  Mouth/Throat: Oropharynx is clear and moist.  Eyes: Conjunctivae are normal. Pupils are equal, round, and reactive to light.  Pulmonary/Chest: Effort normal. No respiratory distress.  Abdominal: Soft. She exhibits no distension. There is no tenderness. There is no guarding.       Incision closed & healing well.  Drainage serosanguinous. 10mL.  No erythema       Assessment:     1. Urinary tract infection  2. Infected seroma status post drainage, improving.    Plan:     Start ciprofloxacin. She or he has a prescription at home. I gave her another one just in case. This should help resolve urinary tract infection. If it does not improve, then I would get urinalysis and urine culture recheck things.  Continue drain for now. Probably can remove later this week when she follows up with Dr. Luisa Hart in on Friday. No evidence of any new infection. Continue doxycycline as he recommends.  She expressed understanding and appreciation

## 2010-11-15 NOTE — Patient Instructions (Addendum)
TAKE CIPROFLOXACIN to treat the UTI  Continue Doxycycline for the seroma infection near the hernia repair   Urinary Tract Infection (UTI) Infections of the urinary tract can start in several places. A bladder infection (cystitis), a kidney infection (pyelonephritis), and a prostate infection (prostatitis) are different types of urinary tract infections. They usually get better if treated with medicines (antibiotics) that kill germs. Take all the medicine until it is gone. You or your child may feel better in a few days, but TAKE ALL MEDICINE or the infection may not respond and may become more difficult to treat. HOME CARE INSTRUCTIONS  Drink enough water and fluids to keep the urine clear or pale yellow. Cranberry juice is especially recommended, in addition to large amounts of water.   Avoid caffeine, tea, and carbonated beverages. They tend to irritate the bladder.   Alcohol may irritate the prostate.   Only take over-the-counter or prescription medicines for pain, discomfort, or fever as directed by your caregiver.  FINDING OUT THE RESULTS OF YOUR TEST Not all test results are available during your visit. If your or your child's test results are not back during the visit, make an appointment with your caregiver to find out the results. Do not assume everything is normal if you have not heard from your caregiver or the medical facility. It is important for you to follow up on all test results. TO PREVENT FURTHER INFECTIONS:  Empty the bladder often. Avoid holding urine for long periods of time.   After a bowel movement, women should cleanse from front to back. Use each tissue only once.   Empty the bladder before and after sexual intercourse.  SEEK MEDICAL CARE IF:  There is back pain.   You or your child has an oral temperature above 102F.   Your baby is older than 3 months with a rectal temperature of 100.5 F (38.1 C) or higher for more than 1 day.   Your or your child's  problems (symptoms) are no better in 3 days. Return sooner if you or your child is getting worse.  SEEK IMMEDIATE MEDICAL CARE IF:  There is severe back pain or lower abdominal pain.   You or your child develops chills.   You or your child has an oral temperature above 102F, not controlled by medicine.   Your baby is older than 3 months with a rectal temperature of 102 F (38.9 C) or higher.   Your baby is 90 months old or younger with a rectal temperature of 100.4 F (38 C) or higher.   There is nausea or vomiting.   There is continued burning or discomfort with urination.  MAKE SURE YOU:  Understand these instructions.   Will watch this condition.   Will get help right away if you or your child is not doing well or gets worse.  Document Released: 12/22/2004 Document Re-Released: 06/08/2009 [facility] Patient Information 2011 [place], [place].

## 2010-11-19 ENCOUNTER — Ambulatory Visit (INDEPENDENT_AMBULATORY_CARE_PROVIDER_SITE_OTHER): Payer: Medicare Other | Admitting: Surgery

## 2010-11-19 ENCOUNTER — Encounter (INDEPENDENT_AMBULATORY_CARE_PROVIDER_SITE_OTHER): Payer: Self-pay | Admitting: Surgery

## 2010-11-19 DIAGNOSIS — T8140XA Infection following a procedure, unspecified, initial encounter: Secondary | ICD-10-CM

## 2010-11-19 NOTE — Progress Notes (Signed)
Patient returns to clinic today. Is feeling better. He was seen 4 days ago for a urinary tract infection and placed on Cipro by Dr. Michaell Cowing. She continues to have drainage from her Jackson-Pratt drain. It is serous in nature measuring anywhere from 25-75 a day she does have some mid back pain bilaterally.  On exam: Abdominal wall with no erythema. JP draining serous in nature. The posterior wound is well-closed. Mild tenderness to palpation of the mid back.  Impression: Postop seroma infection after complex hernia repair using component separation and biologic mesh requiring postop drain placement  Urinary tract infection on Cipro  Plan: She will finish her antibiotics. I'll see her back in 2 weeks. She is improving.

## 2010-11-19 NOTE — Patient Instructions (Addendum)
Follow up in two weeks.  Drain care.  If drain comes out, call us.

## 2010-12-02 ENCOUNTER — Encounter (INDEPENDENT_AMBULATORY_CARE_PROVIDER_SITE_OTHER): Payer: Self-pay | Admitting: Surgery

## 2010-12-02 ENCOUNTER — Ambulatory Visit (INDEPENDENT_AMBULATORY_CARE_PROVIDER_SITE_OTHER): Payer: Medicare Other | Admitting: Surgery

## 2010-12-02 VITALS — BP 146/90 | HR 64 | Temp 96.8°F

## 2010-12-02 DIAGNOSIS — IMO0002 Reserved for concepts with insufficient information to code with codable children: Secondary | ICD-10-CM

## 2010-12-02 NOTE — Patient Instructions (Signed)
Light duty.  RTC next week.

## 2010-12-02 NOTE — Progress Notes (Signed)
Patient returns to clinic today. She is doing well.  Examination incision well-healed. JP drain in place with serous drainage. No redness. Impression status post repair of ventral hernia complicated  by seroma infection  Plan: Continue JP drainage. She's done antibiotics. I will see her back in one week.  Continue JP.  Light duty.

## 2010-12-22 ENCOUNTER — Encounter (INDEPENDENT_AMBULATORY_CARE_PROVIDER_SITE_OTHER): Payer: Self-pay | Admitting: Surgery

## 2010-12-22 ENCOUNTER — Ambulatory Visit (INDEPENDENT_AMBULATORY_CARE_PROVIDER_SITE_OTHER): Payer: Medicare Other | Admitting: Surgery

## 2010-12-22 VITALS — BP 152/90 | HR 80 | Temp 97.4°F | Resp 18 | Ht 61.0 in | Wt 182.0 lb

## 2010-12-22 DIAGNOSIS — IMO0002 Reserved for concepts with insufficient information to code with codable children: Secondary | ICD-10-CM

## 2010-12-22 NOTE — Progress Notes (Signed)
Subjective:     Patient ID: Stacy Kirk, female   DOB: [birth year], 65 y.o.   MRN: [id number]  HPI The patient presents in follow up of persistent seroma after ventral hernia repair.  Her drainage is less. I removed her drain today.  She has an area of soreness over upper abdomen.    Review of Systems  Constitutional: Negative.   HENT: Negative.   Respiratory: Positive for cough.   Gastrointestinal: Positive for abdominal pain.  Hematological: Negative.        Objective:   Physical Exam  Constitutional: She appears well-developed.  HENT:  Head: Normocephalic and atraumatic.  Abdominal:       JP REMOVED.  NO REDNESS DRAINAGE IS CLEAR.  sL REDNESS OVER RUQ.  Not fluctuant.  Itches. Suture below the skin here  Skin: Skin is warm and dry.       Assessment:     Seroma after ventral hernia repair    Plan:     Drain is out.  Follow up in 3-4 weeks. Binder.

## 2010-12-22 NOTE — Patient Instructions (Signed)
Follow up in 3-4 weeks.  Binder.

## 2011-01-14 ENCOUNTER — Encounter (INDEPENDENT_AMBULATORY_CARE_PROVIDER_SITE_OTHER): Payer: Medicare Other | Admitting: Surgery

## 2011-01-24 ENCOUNTER — Ambulatory Visit (INDEPENDENT_AMBULATORY_CARE_PROVIDER_SITE_OTHER): Payer: Medicare Other | Admitting: Surgery

## 2011-01-24 ENCOUNTER — Encounter (INDEPENDENT_AMBULATORY_CARE_PROVIDER_SITE_OTHER): Payer: Self-pay | Admitting: Surgery

## 2011-01-24 VITALS — BP 142/86 | HR 60 | Temp 96.9°F | Resp 20 | Ht 61.0 in | Wt 183.4 lb

## 2011-01-24 DIAGNOSIS — R1031 Right lower quadrant pain: Secondary | ICD-10-CM

## 2011-01-24 DIAGNOSIS — M545 Low back pain: Secondary | ICD-10-CM

## 2011-01-24 DIAGNOSIS — M25569 Pain in unspecified knee: Secondary | ICD-10-CM

## 2011-01-24 DIAGNOSIS — M25561 Pain in right knee: Secondary | ICD-10-CM | POA: Insufficient documentation

## 2011-01-24 DIAGNOSIS — R1903 Right lower quadrant abdominal swelling, mass and lump: Secondary | ICD-10-CM | POA: Insufficient documentation

## 2011-01-24 NOTE — Progress Notes (Signed)
Subjective:     Patient ID: Stacy Kirk, female   DOB: [birth year], 65 y.o.   MRN: [id number]  HPI  Patient Care Team: Eunice Blase as PCP - General (Internal Medicine) Clovis Pu. Cornett, MD as Consulting Physician (General Surgery)  This patient is a 65 y.o.female who presents today for surgical evaluation.   Procedure: Redo ventral her your care with component separation and biologic mesh 09/08/2010  Patient comes in today feeling a bulge in the right upper abdomen. She was worried the seroma had come back. Overall soreness around the incisions gone down. Just one spot in the right lower quadrant and occasionally pulls but is improved. She thinks that the numbness has spread from her incisions were upper abdomen. At a time she talks about the needle poking feeling.   No fevers chills or sweats. No nausea or vomiting. Her bowels move and regular. She's on exercising. She claims she can't do that or walk. She claims her low back pain. She complains about she twisted her right knee and then the hub labeled it with. She worries ureteric infection is not completely resolved. However she claims that ever time she called her primary care physician name director to the surgeon and that no one can see her until we clear her.  Past Medical History  Diagnosis Date  . Hypertension   . Hernia   . Cancer     ovarian  . Arthritis   . Osteoporosis   . Diabetes mellitus   . Hemorrhoids   . Loss of appetite   . Muscle spasm     back  . Redness     abd wound  . Urine frequency     unable to hold urine   . Wears hearing aid     bilateral     Past Surgical History  Procedure Date  . Toe surgery   . Cholecystectomy   . Abdominal hysterectomy   . Tonsillectomy   . Appendectomy   . Hernia repair [phone]    open component separation w biologic mesh (Dr. Luisa Hart)    History   Social History  . Marital Status: Married    Spouse Name: N/A    Number of Children: N/A  . Years of  Education: N/A   Occupational History  . Not on file.   Social History Main Topics  . Smoking status: Former Games developer  . Smokeless tobacco: Never Used  . Alcohol Use: No  . Drug Use: No  . Sexually Active: Not on file   Other Topics Concern  . Not on file   Social History Narrative  . No narrative on file    Family History  Problem Relation Age of Onset  . Diabetes Mother     Current outpatient prescriptions:aspirin 81 MG tablet, Take 81 mg by mouth daily.  , Disp: , Rfl: ;  CALCIUM-MAGNESIUM-VITAMIN D PO, Take by mouth daily.  , Disp: , Rfl: ;  Docusate Calcium (STOOL SOFTENER PO), Take by mouth daily.  , Disp: , Rfl: ;  fluticasone (FLONASE) 50 MCG/ACT nasal spray, as needed. , Disp: , Rfl: ;  levothyroxine (SYNTHROID, LEVOTHROID) 100 MCG tablet, Daily., Disp: , Rfl:  MELOXICAM PO, Take by mouth daily.  , Disp: , Rfl: ;  Multiple Vitamin (MULTIVITAMIN) capsule, Take 1 capsule by mouth daily.  , Disp: , Rfl: ;  Multiple Vitamins-Minerals (HAIR/SKIN/NAILS PO), Take by mouth daily.  , Disp: , Rfl: ;  Omega-3 Fatty Acids (FISH OIL PO), Take 2  tablets by mouth daily.  , Disp: , Rfl: ;  Probiotic Product (PROBIOTIC PO), Take by mouth daily.  , Disp: , Rfl:   Allergies  Allergen Reactions  . Erythromycin Other (See Comments)    "tears" her stomach up. Major digestive upset.  . Aspirin Itching  . Codeine Itching  . Tramadol     Dizzy        Review of Systems  Constitutional: Negative for fever, chills and diaphoresis.  HENT: Negative for ear pain, sore throat and trouble swallowing.   Eyes: Negative for photophobia and visual disturbance.  Respiratory: Negative for cough and choking.   Cardiovascular: Negative for chest pain and palpitations.  Gastrointestinal: Positive for abdominal pain. Negative for nausea, vomiting, diarrhea, constipation, blood in stool, abdominal distention, anal bleeding and rectal pain.  Genitourinary: Negative for dysuria, frequency, enuresis and  difficulty urinating.  Musculoskeletal: Positive for myalgias, back pain, arthralgias and gait problem.  Skin: Negative for color change, pallor and rash.  Neurological: Negative for dizziness, speech difficulty, weakness and numbness.  Hematological: Negative for adenopathy.  Psychiatric/Behavioral: Negative for confusion and agitation. The patient is not nervous/anxious.        Objective:   Physical Exam  Constitutional: She is oriented to person, place, and time. She appears well-developed and well-nourished. No distress.  HENT:  Head: Normocephalic.  Mouth/Throat: Oropharynx is clear and moist. No oropharyngeal exudate.  Eyes: Conjunctivae and EOM are normal. Pupils are equal, round, and reactive to light. No scleral icterus.  Neck: Normal range of motion. No tracheal deviation present.  Cardiovascular: Normal rate and intact distal pulses.   Pulmonary/Chest: Effort normal. No respiratory distress. She exhibits no tenderness.  Abdominal: Soft. She exhibits no distension and no mass. There is no tenderness. There is no rebound and no guarding. Hernia confirmed negative in the right inguinal area and confirmed negative in the left inguinal area.       Incisions clean with normal healing ridges.  No hernias.  Mild fullness RUQ, no mass.  No cellulitis.  Genitourinary: No vaginal discharge found.  Musculoskeletal: Normal range of motion. She exhibits no edema.       Moderate lumbar soreness.  Mild TTP L knee.  NL AROM.  No drawer sign.  Normal gait  Lymphadenopathy:       Right: No inguinal adenopathy present.       Left: No inguinal adenopathy present.  Neurological: She is alert and oriented to person, place, and time. No cranial nerve deficit. She exhibits normal muscle tone. Coordination normal.  Skin: Skin is warm and dry. No rash noted. She is not diaphoretic.  Psychiatric: She has a normal mood and affect. Her behavior is normal. Judgment and thought content normal.        Talkative but pleasant.  Not toxic       Assessment:     Over 4 months status post redo repair of complex ventral hernia the patient with multiple medical problems. Overall improved. No evidence of cellulitis, seroma, recurrent hernia.    Plan:     I think I was successful in reassuring the patient that she is making progress. I noted that abdominal pain is common with redo ventral hernia repairs. He can take up to 6-12 months to resolve. She can have chronic irritating areas for the rest of her life. Given her obesity and back problems and other joint problems and chronic pain issues; I am not surprised that she is now pain-free yet. I don't know  she ever will be, but it seems quite mild now & markedly improved.  She agrees..  I recommended that she discuss with her primary care physician the possibility of evaluation for her low back pain, right knee pain, persistent urinary issues. I see NO reason to delay this. She is cleared from a surgery standpoint to go back and address all her other numerous health issues.  It is reasonable for her to keep her long-term followup appt with Dr. Luisa Hart next month just to make sure she's making improvement. She feels reassured. She walked out without difficulty.

## 2011-01-24 NOTE — Patient Instructions (Signed)

## 2011-01-27 ENCOUNTER — Encounter (INDEPENDENT_AMBULATORY_CARE_PROVIDER_SITE_OTHER): Payer: Self-pay

## 2011-02-09 ENCOUNTER — Encounter (INDEPENDENT_AMBULATORY_CARE_PROVIDER_SITE_OTHER): Payer: Self-pay | Admitting: Surgery

## 2011-02-09 ENCOUNTER — Ambulatory Visit (INDEPENDENT_AMBULATORY_CARE_PROVIDER_SITE_OTHER): Payer: Medicare Other | Admitting: Surgery

## 2011-02-09 VITALS — BP 180/100 | HR 66 | Temp 97.5°F | Resp 12 | Wt 182.0 lb

## 2011-02-09 DIAGNOSIS — IMO0002 Reserved for concepts with insufficient information to code with codable children: Secondary | ICD-10-CM

## 2011-02-09 NOTE — Patient Instructions (Signed)
Return in two months

## 2011-02-09 NOTE — Progress Notes (Signed)
Subjective:     Patient ID: Stacy Kirk, female   DOB: 23-Oct-1945, 65 y.o.   MRN: [id number]  HPI Patient returns today in followup after complex ventral hernia repair in June 2012 with persistent postoperative seroma. She's a drainage tube placement in the past. She's here for routine followup. She does feel numb around the incision and feels fullness. She denies any fever or chills. She has had some urinary incontinence and dysuria off and on since surgery.   Review of Systems  Constitutional: Negative.   HENT: Negative.   Gastrointestinal: Positive for abdominal pain.  Genitourinary: Positive for dysuria.       Objective:   Physical Exam  Constitutional: She appears well-developed and well-nourished.  HENT:  Head: Normocephalic and atraumatic.  Eyes: EOM are normal. Pupils are equal, round, and reactive to light.  Neck: Normal range of motion. Neck supple.  Abdominal: Soft.       Incision well healed. Small seroma palpated. Ultrasound used which showed no delay or fluid in the operative bed of about 2 cm. Under sterile conditions this was aspirated and serous and was fluid was removed. Total of 10 cc could be extracted.  Skin: Skin is warm and dry.       Assessment:     Post op seroma    Plan:     Return to clinic in two months or sooner if issues arise.

## 2011-04-11 ENCOUNTER — Encounter (INDEPENDENT_AMBULATORY_CARE_PROVIDER_SITE_OTHER): Payer: Medicare Other | Admitting: Surgery

## 2011-05-02 ENCOUNTER — Encounter (INDEPENDENT_AMBULATORY_CARE_PROVIDER_SITE_OTHER): Payer: Self-pay | Admitting: Surgery

## 2011-05-02 ENCOUNTER — Ambulatory Visit (INDEPENDENT_AMBULATORY_CARE_PROVIDER_SITE_OTHER): Payer: Medicare Other | Admitting: Surgery

## 2011-05-02 VITALS — BP 150/92 | HR 68 | Temp 98.6°F | Resp 14 | Ht 60.0 in | Wt 180.0 lb

## 2011-05-02 DIAGNOSIS — Z8719 Personal history of other diseases of the digestive system: Secondary | ICD-10-CM

## 2011-05-02 NOTE — Progress Notes (Signed)
Subjective:     Patient ID: Stacy Kirk, female   DOB: [birth year], 66 y.o.   MRN: [id number]  HPIPatient returns in followup after repair of ventral hernia last fall. She is doing well.   Review of Systems  Constitutional: Negative for fever, chills and unexpected weight change.  HENT: Negative for hearing loss, congestion, sore throat, trouble swallowing and voice change.   Eyes: Negative for visual disturbance.  Respiratory: Negative for cough and wheezing.   Cardiovascular: Negative for chest pain, palpitations and leg swelling.  Gastrointestinal: Negative for nausea, vomiting, abdominal pain, diarrhea, constipation, blood in stool, abdominal distention and anal bleeding.  Genitourinary: Negative for hematuria, vaginal bleeding and difficulty urinating.  Musculoskeletal: Negative for arthralgias.  Skin: Negative for rash and wound.  Neurological: Negative for seizures, syncope and headaches.  Hematological: Negative for adenopathy. Does not bruise/bleed easily.  Psychiatric/Behavioral: Negative for confusion.       Objective:   Physical Exam  Constitutional: She appears well-developed and well-nourished.  Neck: Normal range of motion. Neck supple.  Abdominal:         Assessment:     History of complex ventral hernia repair and colostomy closure and postop seroma    Plan:     Return as needed.

## 2011-05-02 NOTE — Patient Instructions (Signed)
Follow up as needed

## 2012-12-25 ENCOUNTER — Encounter (INDEPENDENT_AMBULATORY_CARE_PROVIDER_SITE_OTHER): Payer: Self-pay

## 2013-07-15 IMAGING — CT CT ABD-PELV W/ CM
2 of 5 series · 14 of 42 positions shown, 18 images · IV contrast (READICAT/WATER & [ID] OMNI 300)
Comparison: 08/31/2010

BUN and creatinine were obtained on site at [HOSPITAL] at
[HOSPITAL].
Results:  BUN unavailable,  Creatinine 0.7 mg/dL.
CLINICAL DATA: Postop from abdominal hernia repair.  Follow-up
anterior abdominal wall seroma.  Increased erythema and imaging.
Abdominal pain and fever.

CT ABDOMEN AND PELVIS WITH CONTRAST
TECHNIQUE: Multidetector CT imaging of the abdomen and pelvis was
performed following the standard protocol during bolus
administration of intravenous contrast.
Contrast: 100 ml Mmnipaque-OWW and oral contrast

[Series 2: abdomen w/ · axial · 0.82mm/px · z∈[-289,+41]mm · 11 of 76 slices shown, 15 images]
[im 5/76  soft-tissue]
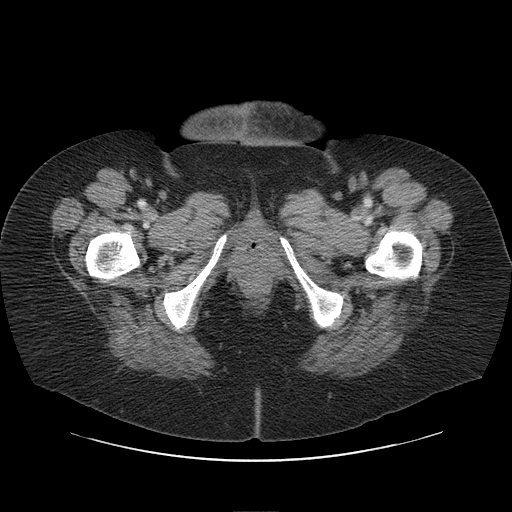
[im 5/76  bone]
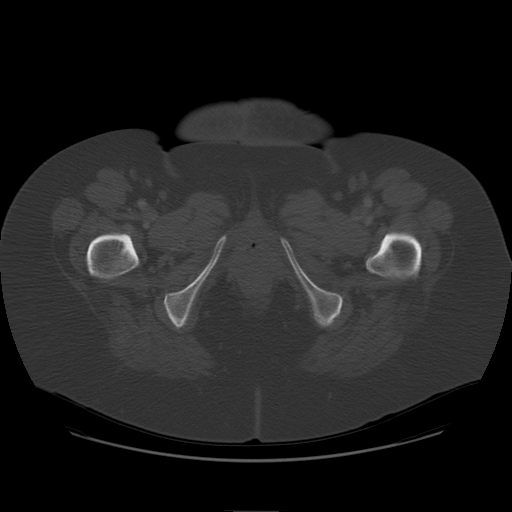
[im 13/76  soft-tissue]
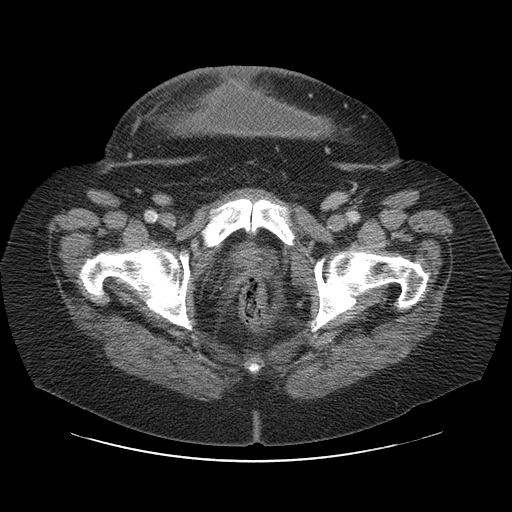
[im 21/76  soft-tissue]
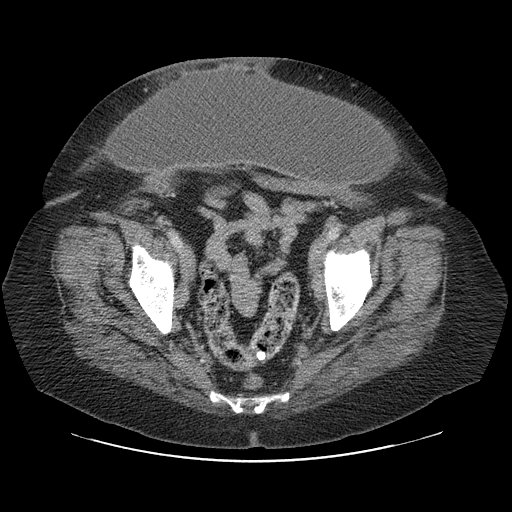
[im 30/76  soft-tissue]
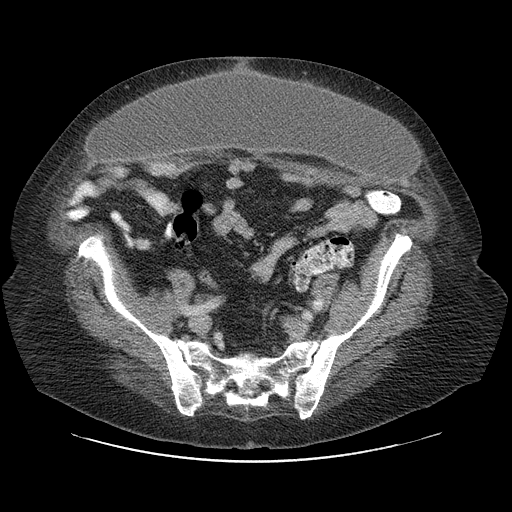
[im 38/76  soft-tissue]
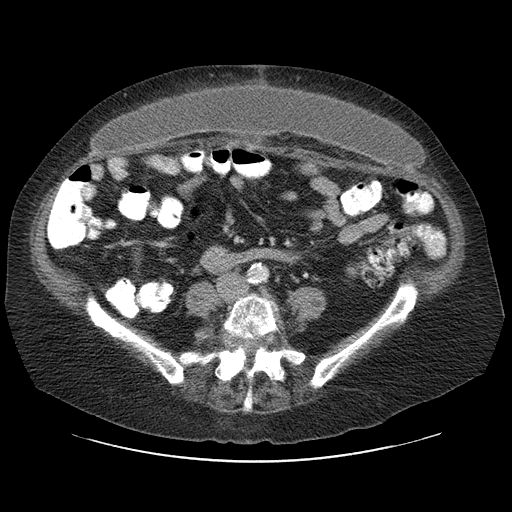
[im 46/76  soft-tissue]
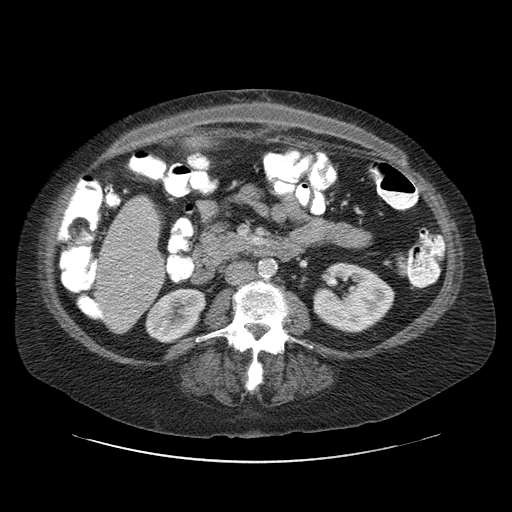
[im 55/76  soft-tissue]
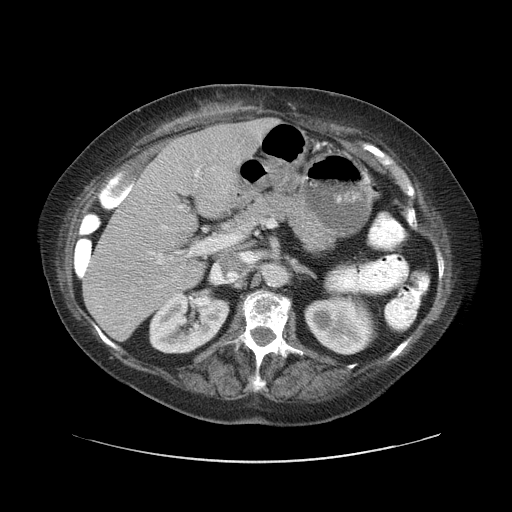
[im 59/76  lung]
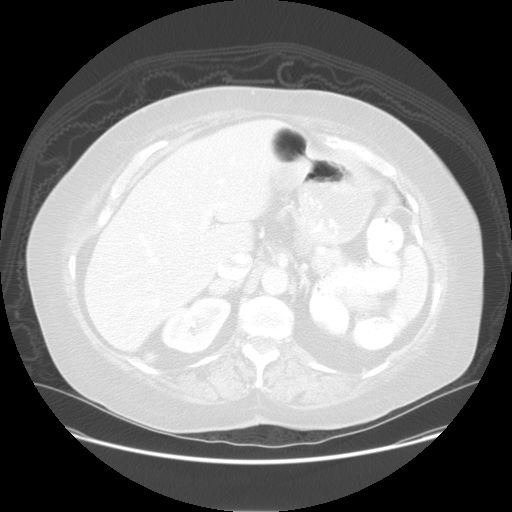
[im 63/76  soft-tissue]
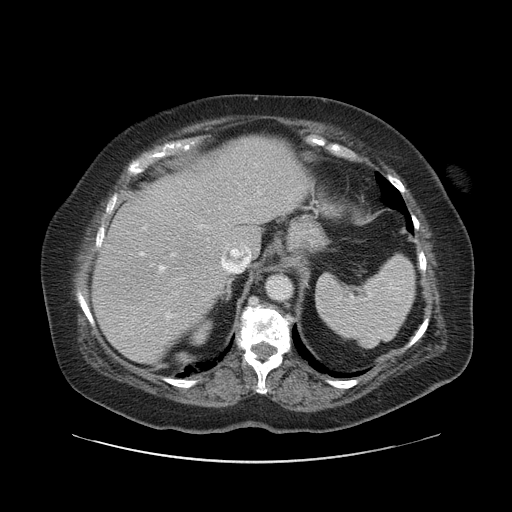
[im 63/76  lung]
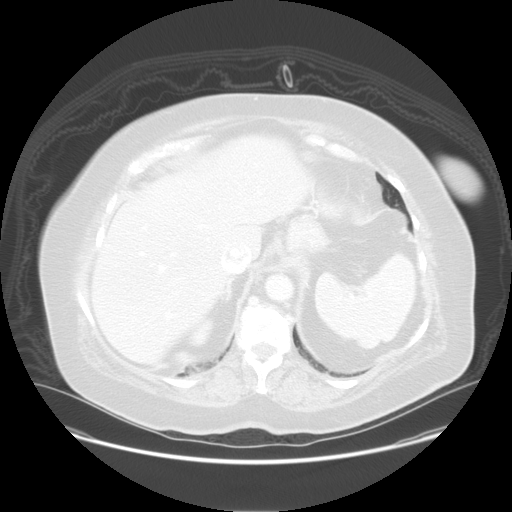
[im 67/76  lung]
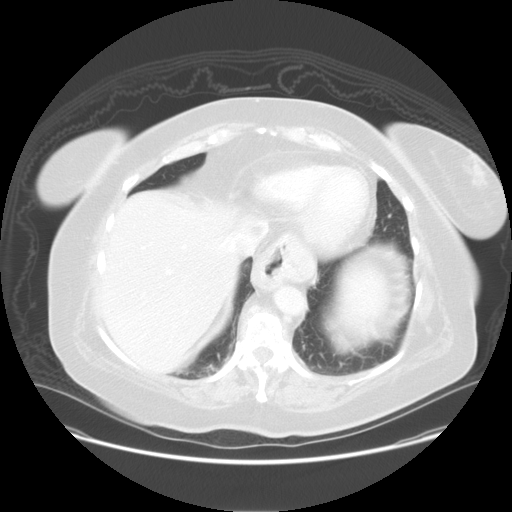
[im 71/76  soft-tissue]
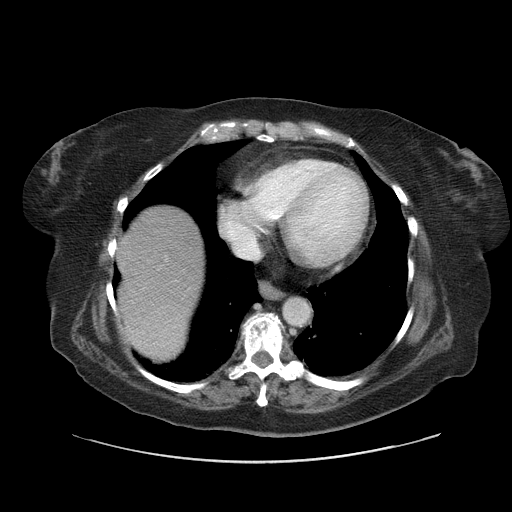
[im 71/76  lung]
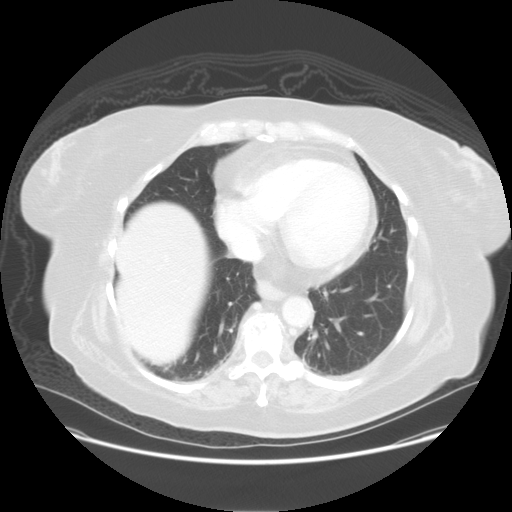
[im 71/76  bone]
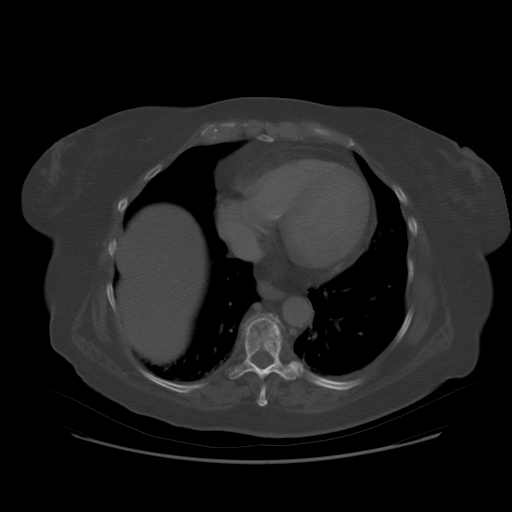

[Series 400: coronal · coronal · 0.82mm/px · 3 of 142 slices shown]
[im 36/142  soft-tissue]
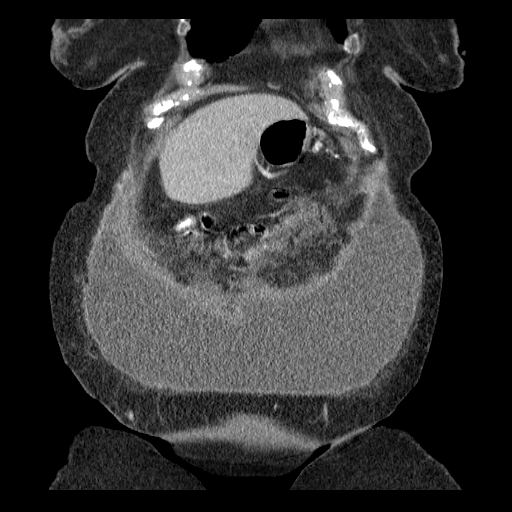
[im 71/142  soft-tissue]
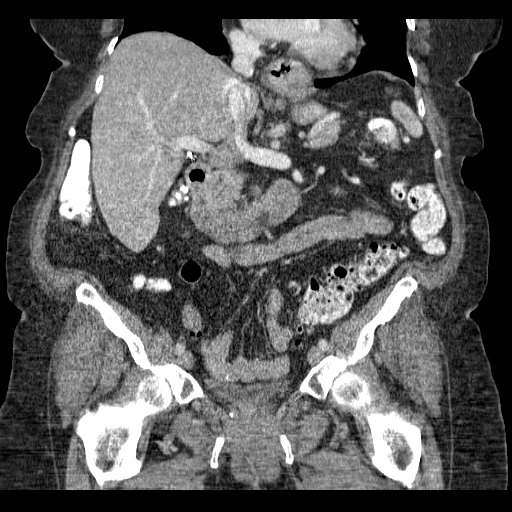
[im 106/142  soft-tissue]
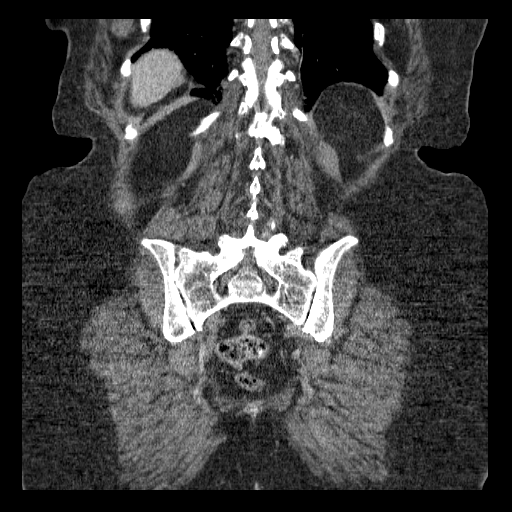

[14 of 42 positions shown; findings below may reference images not displayed]

FINDINGS: There has been interval surgical repair of previously
seen anterior abdominal wall hernias, and no residual or recurrent
hernia is identified.  There is a large rim enhancing fluid
collection in the anterior abdominal wall subcutaneous tissues
superficial to the surgical mesh.  This measures 7 x 26 cm, and
could represent a seroma or abscess.  No intra-abdominal or
intrapelvic fluid collections are seen in the intraperitoneal or
retroperitoneal spaces.  The

A small hiatal hernia is again seen.  Small left renal cyst is
stable.  The abdominal parenchymal organs are otherwise normal in
appearance.  A small right adrenal mass remains stable since prior
exam on 05/07/2009, consistent with a benign adrenal adenoma.  No
other soft tissue masses or lymphadenopathy identified within the
abdomen.  Shotty bilateral inguinal lymph nodes are seen which are
likely reactive in etiology given the recent surgery.  Prior
hysterectomy also noted.

Left colonic diverticulosis is noted, however there is no evidence
of acute inflammatory process.  No evidence of dilated bowel loops.
IMPRESSION: 1.  Large subcutaneous anterior abdominal wall fluid collection
measuring 7 x 26 cm, which could represent a postop seroma or
abscess.  Percutaneous catheter drainage should be considered.
2.  No evidence of recurrent ventral abdominal wall hernia.
3.  Stable incidental findings including small hiatal hernia,
benign right adrenal adenoma, and diverticulosis.

## 2015-05-12 DIAGNOSIS — R519 Headache, unspecified: Secondary | ICD-10-CM

## 2015-05-12 HISTORY — DX: Headache, unspecified: R51.9

## 2016-07-13 ENCOUNTER — Other Ambulatory Visit (HOSPITAL_BASED_OUTPATIENT_CLINIC_OR_DEPARTMENT_OTHER): Payer: Self-pay | Admitting: Neurosurgery

## 2016-07-13 DIAGNOSIS — M4807 Spinal stenosis, lumbosacral region: Secondary | ICD-10-CM

## 2016-07-23 ENCOUNTER — Ambulatory Visit (HOSPITAL_BASED_OUTPATIENT_CLINIC_OR_DEPARTMENT_OTHER)
Admission: RE | Admit: 2016-07-23 | Discharge: 2016-07-23 | Disposition: A | Discharge status: Home / Self Care | Payer: Medicare Other | Source: Ambulatory Visit | Attending: Neurosurgery | Admitting: Neurosurgery

## 2016-07-23 DIAGNOSIS — M48061 Spinal stenosis, lumbar region without neurogenic claudication: Secondary | ICD-10-CM | POA: Insufficient documentation

## 2016-07-23 DIAGNOSIS — M1288 Other specific arthropathies, not elsewhere classified, other specified site: Secondary | ICD-10-CM | POA: Diagnosis not present

## 2016-07-23 DIAGNOSIS — M4807 Spinal stenosis, lumbosacral region: Secondary | ICD-10-CM | POA: Insufficient documentation

## 2016-07-23 DIAGNOSIS — M5126 Other intervertebral disc displacement, lumbar region: Secondary | ICD-10-CM | POA: Diagnosis not present

## 2016-08-19 ENCOUNTER — Other Ambulatory Visit: Payer: Self-pay | Admitting: Neurosurgery

## 2016-09-06 ENCOUNTER — Encounter (HOSPITAL_COMMUNITY): Payer: Self-pay

## 2016-09-06 ENCOUNTER — Encounter (HOSPITAL_COMMUNITY)
Admission: RE | Admit: 2016-09-06 | Discharge: 2016-09-06 | Disposition: A | Discharge status: Home / Self Care | Payer: Medicare Other | Source: Ambulatory Visit | Attending: Neurosurgery | Admitting: Neurosurgery

## 2016-09-06 HISTORY — DX: Other complications of anesthesia, initial encounter: T88.59XA

## 2016-09-06 HISTORY — DX: Constipation, unspecified: K59.00

## 2016-09-06 HISTORY — DX: Headache: R51

## 2016-09-06 HISTORY — DX: Personal history of urinary calculi: Z87.442

## 2016-09-06 HISTORY — DX: Hypothyroidism, unspecified: E03.9

## 2016-09-06 HISTORY — DX: Gastro-esophageal reflux disease without esophagitis: K21.9

## 2016-09-06 HISTORY — DX: Adverse effect of unspecified anesthetic, initial encounter: T41.45XA

## 2016-09-06 LAB — CBC
HCT: 43.3 % (ref 36.0–46.0)
Hemoglobin: 14.2 g/dL (ref 12.0–15.0)
MCH: 30.3 pg (ref 26.0–34.0)
MCHC: 32.8 g/dL (ref 30.0–36.0)
MCV: 92.5 fL (ref 78.0–100.0)
Platelets: 228 10*3/uL (ref 150–400)
RBC: 4.68 MIL/uL (ref 3.87–5.11)
RDW: 14.3 % (ref 11.5–15.5)
WBC: 6.9 10*3/uL (ref 4.0–10.5)

## 2016-09-06 LAB — BASIC METABOLIC PANEL
Anion gap: 10 (ref 5–15)
BUN: 12 mg/dL (ref 6–20)
CALCIUM: 9.8 mg/dL (ref 8.9–10.3)
CO2: 29 mmol/L (ref 22–32)
Chloride: 94 mmol/L — ABNORMAL LOW (ref 101–111)
Creatinine, Ser: 0.74 mg/dL (ref 0.44–1.00)
GFR calc Af Amer: >60 mL/min (ref >60)
GFR calc non Af Amer: >60 mL/min (ref >60)
GLUCOSE: 104 mg/dL — AB (ref 65–99)
Potassium: 3.2 mmol/L — ABNORMAL LOW (ref 3.5–5.1)
Sodium: 133 mmol/L — ABNORMAL LOW (ref 135–145)

## 2016-09-06 LAB — TYPE AND SCREEN
ABO/RH(D): B POS
ANTIBODY SCREEN: NEGATIVE

## 2016-09-06 LAB — SURGICAL PCR SCREEN
MRSA, PCR: NEGATIVE
STAPHYLOCOCCUS AUREUS: NEGATIVE

## 2016-09-06 LAB — ABO/RH: ABO/RH(D): B POS

## 2016-09-06 LAB — GLUCOSE, CAPILLARY: GLUCOSE-CAPILLARY: 102 mg/dL — AB (ref 65–99)

## 2016-09-06 NOTE — Progress Notes (Signed)
Stacy Kirk has type II DM , she does not check CBGs, last A1C was 6.1 in February 2018.  I requested labs, any cardiac studies from [facility] in [place].  Patient had an EKG done in May 2018. Stacy Kirk reports that she is always constipated.  I explained to patient that it would be best ifshe would take something, so that she will not come for back fusion constipated, that the medications given post operative and change in diet and decreased activity, along with [name], could cause for severe constipation/problems.  Patient said she will go home and drink prune juice and eat prunes and hope it will work.

## 2016-09-06 NOTE — Pre-Procedure Instructions (Addendum)
Stacy Kirk  09/06/2016    Your procedure is scheduled on Wednesday, June 13  Report to [facility] Admitting at 6:30  A.M.               Your surgery or procedure is scheduled for 8:30 AM   Call this number if you have problems the morning of surgery: [phone]    Remember:  Do not eat food or drink liquids after midnight.  Take these medicines the morning of surgery with A SIP OF WATER :levothyroxine (SYNTHROID, LEVOTHROID).                Take if needed: acetaminophen (TYLENOL), baclofen (LIORESAL).                 STOP taking Aspirin, Aspirin Products (Goody Powder, Excedrin Migraine), Ibuprofen (Advil), Naproxen (Aleve), Vitamins and Herbal Products (ie Fish Oil)   How to Manage Your Diabetes Before and After Surgery  Why is it important to control my blood sugar before and after surgery? . Improving blood sugar levels before and after surgery helps healing and can limit problems. . A way of improving blood sugar control is eating a healthy diet by: o  Eating less sugar and carbohydrates o  Increasing activity/exercise o  Talking with your doctor about reaching your blood sugar goals . High blood sugars (greater than 180 mg/dL) can raise your risk of infections and slow your recovery, so you will need to focus on controlling your diabetes during the weeks before surgery. . Make sure that the doctor who takes care of your diabetes knows about your planned surgery including the date and location.  How do I manage my blood sugar before surgery? . Check your blood sugar at least 4 times a day, starting 2 days before surgery, to make sure that the level is not too high or low. o Check your blood sugar the morning of your surgery when you wake up and every 2 hours until you get to the Short Stay unit. . If your blood sugar is less than 70 mg/dL, you will need to treat for low blood sugar: o Do not take insulin. o Treat a low blood sugar (less than 70 mg/dL)  with  cup of clear juice (cranberry or apple), 4 glucose tablets, OR glucose gel. o Recheck blood sugar in 15 minutes after treatment (to make sure it is greater than 70 mg/dL). If your blood sugar is not greater than 70 mg/dL on recheck, call [phone] for further instructions. . Report your blood sugar to the short stay nurse when you get to Short Stay.  . If you are admitted to the hospital after surgery: o Your blood sugar will be checked by the staff and you will probably be given insulin after surgery (instead of oral diabetes medicines) to make sure you have good blood sugar levels. o The goal for blood sugar control after surgery is 80-180 mg/dL. WHAT DO I DO ABOUT MY DIABETES MEDICATIONS . Do not take oral diabetes medicines (pills) the morning of surgery.  Special Instructions:   [place]- Preparing For Surgery  Before surgery, you can play an important role. Because skin is not sterile, your skin needs to be as free of germs as possible. You can reduce the number of germs on your skin by washing with CHG (chlorahexidine gluconate) Soap before surgery.  CHG is an antiseptic cleaner which kills germs and bonds with the skin to continue killing germs  even after washing.  Please do not use if you have an allergy to CHG or antibacterial soaps. If your skin becomes reddened/irritated stop using the CHG.  Do not shave (including legs and underarms) for at least 48 hours prior to first CHG shower. It is OK to shave your face.  Please follow these instructions carefully.   1. Shower the NIGHT BEFORE SURGERY and the MORNING OF SURGERY with CHG.   2. If you chose to wash your hair, wash your hair first as usual with your normal shampoo.  3. After you shampoo, rinse your hair and body thoroughly to remove the shampoo. Wash your face and private area with your normal soap , rinse. 4. Use CHG as you would any other liquid soap. You can apply CHG directly to the skin and wash gently with  a scrungie or a clean washcloth.   5. Apply the CHG Soap to your body ONLY FROM THE NECK DOWN.  Do not use on open wounds or open sores. Avoid contact with your eyes, ears, mouth and genitals (private parts). Wash genitals (private parts) with your normal soap.  6. Wash thoroughly, paying special attention to the area where your surgery will be performed.  7. Thoroughly rinse your body with warm water from the neck down.  8. DO NOT shower/wash with your normal soap after using and rinsing off the CHG Soap.  9. Pat yourself dry with a CLEAN TOWEL.   10. Wear CLEAN PAJAMAS   11. Place CLEAN SHEETS on your bed the night of your first shower and DO NOT SLEEP WITH PETS.  Day of Surgery: Shower as above Do not apply any deodorants/lotions, powders, perfume. Please wear clean clothes to the hospital/surgery center.    Do not wear jewelry, make-up or nail polish.  Do not shave 48 hours prior to surgery.  Men may shave face and neck.  Do not bring valuables to the hospital.  [facility] is not responsible for any belongings or valuables.  Contacts, dentures or bridgework may not be worn into surgery.  Leave your suitcase in the car.  After surgery it may be brought to your room.  For patients admitted to the hospital, discharge time will be determined by your treatment team.   Please read over the following fact sheets that you were given: Patient Instructions for Mupirocin Application, Pain Booklet, Coughing and Deep Breathing, Surgical Site Infections.    Patient Signature:  Date:  Nurse Signature:                                          Date:

## 2016-09-06 NOTE — Progress Notes (Signed)
Anesthesia Chart Review: Patient is a 71 year old female scheduled for L4-5 PLIF on 09/07/16 by Dr. Saintclair Halsted. (PAT was at 2 PM on 09/06/16 due to our scheduler not being able to get in touch with patient.)  History includes former smoker, hypertension, diabetes mellitus type 2, hypothyroidism, GERD, ovarian cancer s/p hysterectomy, skin cancer, nephrolithiasis, osteoporosis, hearing aids, cholecystectomy, tonsillectomy, appendectomy, repair of complex recurrent incisional hernia with myofascial advancement flap creation 09/08/10, right [facility] Port-a-cath.   For anesthesia history, she reported being slow to awaken from anesthesia.   Meds include aspirin 81 mg, baclofen, HCTZ, levothyroxine, Linda S, magnesium gluconate, metformin, Toprol-XL, MVI.  She has multiple allergies/intolerances including tramadol (SOB), albumin (unsure reaction), ASA (lethary, itching), codeine (itching), cortisone (headaches), propoxyphene ("terrible reaction", headaches), simvastatin (severe muscle aches), triamcinolone acetonide (headaches), Erythromycin ("major" GI upset).  BP 138/88   Pulse 91   Temp 36.7 C   Resp 18   Ht 5\' 2"  (1.575 m)   Wt 187 lb (84.8 kg)   SpO2 96%   BMI 34.20 kg/m   EKG 08/17/16 [facility]): NSR, incomplete right BBB:  Preoperative labs noted. Na 133. K 3.2. Glucose 104. Cr 0.74. CBC WNL. A1c is pending, but was 6.1 on 05/06/16 [facility]).   Based on currently available information, I anticipate that she can proceed as planned if no acute changes.  George Hugh [facility] Short Stay Center/Anesthesiology Phone [phone] 09/06/2016 5:06 PM

## 2016-09-07 ENCOUNTER — Inpatient Hospital Stay (HOSPITAL_COMMUNITY): Payer: Medicare Other | Admitting: Vascular Surgery

## 2016-09-07 ENCOUNTER — Inpatient Hospital Stay (HOSPITAL_COMMUNITY): Payer: Medicare Other | Admitting: Anesthesiology

## 2016-09-07 ENCOUNTER — Encounter (HOSPITAL_COMMUNITY)
Admission: RE | Disposition: A | Discharge status: Home / Self Care | Payer: Self-pay | Source: Ambulatory Visit | Attending: Neurosurgery

## 2016-09-07 ENCOUNTER — Encounter (HOSPITAL_COMMUNITY): Payer: Self-pay | Admitting: Anesthesiology

## 2016-09-07 ENCOUNTER — Inpatient Hospital Stay (HOSPITAL_COMMUNITY)
Admission: RE | Admit: 2016-09-07 | Discharge: 2016-09-08 | DRG: 460 | Disposition: A | Discharge status: Home / Self Care | Payer: Medicare Other | Source: Ambulatory Visit | Attending: Neurosurgery | Admitting: Neurosurgery

## 2016-09-07 ENCOUNTER — Inpatient Hospital Stay (HOSPITAL_COMMUNITY): Payer: Medicare Other

## 2016-09-07 DIAGNOSIS — M4316 Spondylolisthesis, lumbar region: Secondary | ICD-10-CM | POA: Diagnosis present

## 2016-09-07 DIAGNOSIS — Z85828 Personal history of other malignant neoplasm of skin: Secondary | ICD-10-CM

## 2016-09-07 DIAGNOSIS — Z7984 Long term (current) use of oral hypoglycemic drugs: Secondary | ICD-10-CM

## 2016-09-07 DIAGNOSIS — Z974 Presence of external hearing-aid: Secondary | ICD-10-CM | POA: Diagnosis not present

## 2016-09-07 DIAGNOSIS — Z833 Family history of diabetes mellitus: Secondary | ICD-10-CM | POA: Diagnosis not present

## 2016-09-07 DIAGNOSIS — E119 Type 2 diabetes mellitus without complications: Secondary | ICD-10-CM | POA: Diagnosis present

## 2016-09-07 DIAGNOSIS — E039 Hypothyroidism, unspecified: Secondary | ICD-10-CM | POA: Diagnosis present

## 2016-09-07 DIAGNOSIS — Z886 Allergy status to analgesic agent status: Secondary | ICD-10-CM | POA: Diagnosis not present

## 2016-09-07 DIAGNOSIS — Z90710 Acquired absence of both cervix and uterus: Secondary | ICD-10-CM | POA: Diagnosis not present

## 2016-09-07 DIAGNOSIS — I10 Essential (primary) hypertension: Secondary | ICD-10-CM | POA: Diagnosis present

## 2016-09-07 DIAGNOSIS — Z885 Allergy status to narcotic agent status: Secondary | ICD-10-CM

## 2016-09-07 DIAGNOSIS — Z881 Allergy status to other antibiotic agents status: Secondary | ICD-10-CM

## 2016-09-07 DIAGNOSIS — Z888 Allergy status to other drugs, medicaments and biological substances status: Secondary | ICD-10-CM

## 2016-09-07 DIAGNOSIS — Z6834 Body mass index (BMI) 34.0-34.9, adult: Secondary | ICD-10-CM

## 2016-09-07 DIAGNOSIS — Z9049 Acquired absence of other specified parts of digestive tract: Secondary | ICD-10-CM

## 2016-09-07 DIAGNOSIS — Z419 Encounter for procedure for purposes other than remedying health state, unspecified: Secondary | ICD-10-CM

## 2016-09-07 DIAGNOSIS — M48062 Spinal stenosis, lumbar region with neurogenic claudication: Secondary | ICD-10-CM | POA: Diagnosis present

## 2016-09-07 DIAGNOSIS — Z87891 Personal history of nicotine dependence: Secondary | ICD-10-CM

## 2016-09-07 DIAGNOSIS — M5442 Lumbago with sciatica, left side: Secondary | ICD-10-CM

## 2016-09-07 DIAGNOSIS — Z7982 Long term (current) use of aspirin: Secondary | ICD-10-CM

## 2016-09-07 DIAGNOSIS — Z79899 Other long term (current) drug therapy: Secondary | ICD-10-CM

## 2016-09-07 DIAGNOSIS — M5441 Lumbago with sciatica, right side: Secondary | ICD-10-CM

## 2016-09-07 DIAGNOSIS — M48061 Spinal stenosis, lumbar region without neurogenic claudication: Secondary | ICD-10-CM | POA: Diagnosis present

## 2016-09-07 DIAGNOSIS — K219 Gastro-esophageal reflux disease without esophagitis: Secondary | ICD-10-CM | POA: Diagnosis present

## 2016-09-07 DIAGNOSIS — G8929 Other chronic pain: Secondary | ICD-10-CM

## 2016-09-07 DIAGNOSIS — M810 Age-related osteoporosis without current pathological fracture: Secondary | ICD-10-CM | POA: Diagnosis present

## 2016-09-07 LAB — HEMOGLOBIN A1C
HEMOGLOBIN A1C: 6 % — AB (ref 4.8–5.6)
Mean Plasma Glucose: 126 mg/dL

## 2016-09-07 LAB — GLUCOSE, CAPILLARY
GLUCOSE-CAPILLARY: 129 mg/dL — AB (ref 65–99)
Glucose-Capillary: 119 mg/dL — ABNORMAL HIGH (ref 65–99)
Glucose-Capillary: 136 mg/dL — ABNORMAL HIGH (ref 65–99)
Glucose-Capillary: 121 mg/dL — ABNORMAL HIGH (ref 65–99)

## 2016-09-07 SURGERY — POSTERIOR LUMBAR FUSION 1 LEVEL
Anesthesia: General | Site: Back

## 2016-09-07 MED ORDER — PHENYLEPHRINE HCL 10 MG/ML IJ SOLN
INTRAVENOUS | Status: DC | PRN
  Administered 2016-09-07: 20 ug/min via INTRAVENOUS

## 2016-09-07 MED ORDER — METOPROLOL SUCCINATE ER 25 MG PO TB24
25.0000 mg | ORAL_TABLET | Freq: Every evening | ORAL | Status: DC
  Administered 2016-09-07: 25 mg via ORAL
  Filled 2016-09-07 (×2): qty 1

## 2016-09-07 MED ORDER — LIDOCAINE HCL (CARDIAC) 20 MG/ML IV SOLN
INTRAVENOUS | Status: DC | PRN
  Administered 2016-09-07: 60 mg via INTRAVENOUS

## 2016-09-07 MED ORDER — PROPOFOL 10 MG/ML IV BOLUS
INTRAVENOUS | Status: DC | PRN
  Administered 2016-09-07: 140 mg via INTRAVENOUS

## 2016-09-07 MED ORDER — THROMBIN 20000 UNITS EX SOLR
CUTANEOUS | Status: AC
Start: 2016-09-07 — End: 2016-09-07
  Filled 2016-09-07: qty 20000

## 2016-09-07 MED ORDER — HYDROCORTISONE 1 % EX OINT
TOPICAL_OINTMENT | Freq: Every day | CUTANEOUS | Status: DC | PRN
  Filled 2016-09-07: qty 28.35

## 2016-09-07 MED ORDER — ACETAMINOPHEN 325 MG PO TABS
650.0000 mg | ORAL_TABLET | ORAL | Status: DC | PRN
  Administered 2016-09-08: 650 mg via ORAL
  Filled 2016-09-07 (×2): qty 2

## 2016-09-07 MED ORDER — ONDANSETRON HCL 4 MG/2ML IJ SOLN
4.0000 mg | Freq: Four times a day (QID) | INTRAMUSCULAR | Status: DC | PRN
  Administered 2016-09-07: 4 mg via INTRAVENOUS
  Filled 2016-09-07: qty 2

## 2016-09-07 MED ORDER — THROMBIN 20000 UNITS EX SOLR
CUTANEOUS | Status: DC | PRN
  Administered 2016-09-07: 20 mL via TOPICAL

## 2016-09-07 MED ORDER — ACETAMINOPHEN 500 MG PO TABS
1000.0000 mg | ORAL_TABLET | Freq: Four times a day (QID) | ORAL | Status: DC | PRN
  Administered 2016-09-07 – 2016-09-08 (×2): 1000 mg via ORAL
  Filled 2016-09-07 (×2): qty 2

## 2016-09-07 MED ORDER — HYDROMORPHONE HCL 1 MG/ML IJ SOLN
INTRAMUSCULAR | Status: AC
  Filled 2016-09-07: qty 0.5

## 2016-09-07 MED ORDER — VANCOMYCIN HCL 1000 MG IV SOLR
INTRAVENOUS | Status: AC
  Filled 2016-09-07: qty 1000

## 2016-09-07 MED ORDER — ONDANSETRON HCL 4 MG PO TABS: 4.0000 mg | ORAL_TABLET | Freq: Four times a day (QID) | ORAL | Status: DC | PRN

## 2016-09-07 MED ORDER — BUPIVACAINE LIPOSOME 1.3 % IJ SUSP
20.0000 mL | INTRAMUSCULAR | Status: AC
  Administered 2016-09-07: 20 mL
  Filled 2016-09-07: qty 20

## 2016-09-07 MED ORDER — PROPYLENE GLYCOL 0.6 % OP SOLN: 1.0000 [drp] | Freq: Three times a day (TID) | OPHTHALMIC | Status: DC | PRN

## 2016-09-07 MED ORDER — MENTHOL (TOPICAL ANALGESIC) 1.4 % EX PTCH: MEDICATED_PATCH | Freq: Four times a day (QID) | CUTANEOUS | Status: DC | PRN

## 2016-09-07 MED ORDER — FENTANYL CITRATE (PF) 100 MCG/2ML IJ SOLN
INTRAMUSCULAR | Status: DC | PRN
  Administered 2016-09-07 (×4): 50 ug via INTRAVENOUS

## 2016-09-07 MED ORDER — BACLOFEN 10 MG PO TABS
10.0000 mg | ORAL_TABLET | Freq: Every day | ORAL | Status: DC | PRN
  Filled 2016-09-07: qty 1

## 2016-09-07 MED ORDER — PROPOFOL 10 MG/ML IV BOLUS
INTRAVENOUS | Status: AC
  Filled 2016-09-07: qty 20

## 2016-09-07 MED ORDER — ONDANSETRON HCL 4 MG/2ML IJ SOLN
INTRAMUSCULAR | Status: DC | PRN
  Administered 2016-09-07: 4 mg via INTRAVENOUS

## 2016-09-07 MED ORDER — PHENYLEPHRINE HCL 10 MG/ML IJ SOLN
INTRAMUSCULAR | Status: DC | PRN
  Administered 2016-09-07 (×3): 80 ug via INTRAVENOUS

## 2016-09-07 MED ORDER — ARTIFICIAL TEARS OPHTHALMIC OINT
TOPICAL_OINTMENT | OPHTHALMIC | Status: AC
  Filled 2016-09-07: qty 3.5

## 2016-09-07 MED ORDER — 0.9 % SODIUM CHLORIDE (POUR BTL) OPTIME
TOPICAL | Status: DC | PRN
  Administered 2016-09-07 (×2): 1000 mL

## 2016-09-07 MED ORDER — LEVOTHYROXINE SODIUM 100 MCG PO TABS
100.0000 ug | ORAL_TABLET | Freq: Every day | ORAL | Status: DC
  Administered 2016-09-08: 100 ug via ORAL
  Filled 2016-09-07: qty 1

## 2016-09-07 MED ORDER — PANTOPRAZOLE SODIUM 40 MG IV SOLR
40.0000 mg | Freq: Every day | INTRAVENOUS | Status: DC
  Administered 2016-09-07: 40 mg via INTRAVENOUS
  Filled 2016-09-07: qty 40

## 2016-09-07 MED ORDER — ASPIRIN EC 81 MG PO TBEC
81.0000 mg | DELAYED_RELEASE_TABLET | Freq: Every evening | ORAL | Status: DC
  Administered 2016-09-07: 81 mg via ORAL
  Filled 2016-09-07 (×2): qty 1

## 2016-09-07 MED ORDER — CEFAZOLIN SODIUM-DEXTROSE 2-4 GM/100ML-% IV SOLN
2.0000 g | Freq: Three times a day (TID) | INTRAVENOUS | Status: AC
  Administered 2016-09-07 (×2): 2 g via INTRAVENOUS
  Filled 2016-09-07 (×2): qty 100

## 2016-09-07 MED ORDER — HYDROMORPHONE HCL 1 MG/ML IJ SOLN
0.2500 mg | INTRAMUSCULAR | Status: DC | PRN
  Administered 2016-09-07 (×4): 0.25 mg via INTRAVENOUS

## 2016-09-07 MED ORDER — ROCURONIUM BROMIDE 100 MG/10ML IV SOLN
INTRAVENOUS | Status: DC | PRN
  Administered 2016-09-07: 10 mg via INTRAVENOUS
  Administered 2016-09-07: 55 mg via INTRAVENOUS

## 2016-09-07 MED ORDER — ONDANSETRON HCL 4 MG/2ML IJ SOLN
INTRAMUSCULAR | Status: AC
  Filled 2016-09-07: qty 2

## 2016-09-07 MED ORDER — SODIUM CHLORIDE 0.9 % IR SOLN
Status: DC | PRN
  Administered 2016-09-07: 500 mL

## 2016-09-07 MED ORDER — DOCUSATE SODIUM 100 MG PO CAPS
100.0000 mg | ORAL_CAPSULE | Freq: Every day | ORAL | Status: DC
  Administered 2016-09-07: 100 mg via ORAL
  Filled 2016-09-07 (×2): qty 1

## 2016-09-07 MED ORDER — PHENOL 1.4 % MT LIQD: 1.0000 | OROMUCOSAL | Status: DC | PRN

## 2016-09-07 MED ORDER — HYPROMELLOSE (GONIOSCOPIC) 2.5 % OP SOLN
1.0000 [drp] | Freq: Three times a day (TID) | OPHTHALMIC | Status: DC | PRN
  Filled 2016-09-07: qty 15

## 2016-09-07 MED ORDER — CEFAZOLIN SODIUM-DEXTROSE 2-3 GM-% IV SOLR
2.0000 g | Freq: Once | INTRAVENOUS | Status: AC
  Administered 2016-09-07: 2 g via INTRAVENOUS

## 2016-09-07 MED ORDER — MENTHOL 3 MG MT LOZG: 1.0000 | LOZENGE | OROMUCOSAL | Status: DC | PRN

## 2016-09-07 MED ORDER — SUGAMMADEX SODIUM 200 MG/2ML IV SOLN
INTRAVENOUS | Status: AC
  Filled 2016-09-07: qty 2

## 2016-09-07 MED ORDER — ROCURONIUM BROMIDE 10 MG/ML (PF) SYRINGE
PREFILLED_SYRINGE | INTRAVENOUS | Status: AC
  Filled 2016-09-07: qty 5

## 2016-09-07 MED ORDER — HYDROMORPHONE HCL 1 MG/ML IJ SOLN: 0.5000 mg | INTRAMUSCULAR | Status: DC | PRN

## 2016-09-07 MED ORDER — ADULT MULTIVITAMIN W/MINERALS CH
1.0000 | ORAL_TABLET | Freq: Every day | ORAL | Status: DC
  Filled 2016-09-07: qty 1

## 2016-09-07 MED ORDER — METFORMIN HCL 500 MG PO TABS
500.0000 mg | ORAL_TABLET | Freq: Every day | ORAL | Status: DC
  Administered 2016-09-07: 500 mg via ORAL
  Filled 2016-09-07 (×2): qty 1

## 2016-09-07 MED ORDER — MAGNESIUM GLUCONATE 500 MG PO TABS
500.0000 mg | ORAL_TABLET | Freq: Every day | ORAL | Status: DC
  Administered 2016-09-08: 500 mg via ORAL
  Filled 2016-09-07 (×2): qty 1

## 2016-09-07 MED ORDER — MIDAZOLAM HCL 2 MG/2ML IJ SOLN
INTRAMUSCULAR | Status: AC
  Filled 2016-09-07: qty 2

## 2016-09-07 MED ORDER — MIDAZOLAM HCL 5 MG/5ML IJ SOLN
INTRAMUSCULAR | Status: DC | PRN
  Administered 2016-09-07: 2 mg via INTRAVENOUS

## 2016-09-07 MED ORDER — PHENYLEPHRINE 40 MCG/ML (10ML) SYRINGE FOR IV PUSH (FOR BLOOD PRESSURE SUPPORT)
PREFILLED_SYRINGE | INTRAVENOUS | Status: AC
  Filled 2016-09-07: qty 10

## 2016-09-07 MED ORDER — ALUM & MAG HYDROXIDE-SIMETH 200-200-20 MG/5ML PO SUSP: 30.0000 mL | Freq: Four times a day (QID) | ORAL | Status: DC | PRN

## 2016-09-07 MED ORDER — CYCLOBENZAPRINE HCL 10 MG PO TABS
10.0000 mg | ORAL_TABLET | Freq: Three times a day (TID) | ORAL | Status: DC | PRN
  Administered 2016-09-07: 10 mg via ORAL
  Filled 2016-09-07: qty 1

## 2016-09-07 MED ORDER — PROMETHAZINE HCL 25 MG/ML IJ SOLN: 6.2500 mg | INTRAMUSCULAR | Status: DC | PRN

## 2016-09-07 MED ORDER — HYDROCHLOROTHIAZIDE 25 MG PO TABS
25.0000 mg | ORAL_TABLET | Freq: Every day | ORAL | Status: DC
  Administered 2016-09-07 – 2016-09-08 (×2): 25 mg via ORAL
  Filled 2016-09-07 (×3): qty 1

## 2016-09-07 MED ORDER — ACETAMINOPHEN 650 MG RE SUPP: 650.0000 mg | RECTAL | Status: DC | PRN

## 2016-09-07 MED ORDER — LIDOCAINE-EPINEPHRINE 1 %-1:100000 IJ SOLN
INTRAMUSCULAR | Status: AC
Start: 2016-09-07 — End: 2016-09-07
  Filled 2016-09-07: qty 1

## 2016-09-07 MED ORDER — SODIUM CHLORIDE 0.9 % IV SOLN: 250.0000 mL | INTRAVENOUS | Status: DC

## 2016-09-07 MED ORDER — VANCOMYCIN HCL 1000 MG IV SOLR
INTRAVENOUS | Status: DC | PRN
  Administered 2016-09-07: 1000 mg via TOPICAL

## 2016-09-07 MED ORDER — OXYCODONE HCL 5 MG PO TABS
5.0000 mg | ORAL_TABLET | ORAL | Status: DC | PRN
  Filled 2016-09-07: qty 1

## 2016-09-07 MED ORDER — FENTANYL CITRATE (PF) 250 MCG/5ML IJ SOLN
INTRAMUSCULAR | Status: AC
  Filled 2016-09-07: qty 5

## 2016-09-07 MED ORDER — SODIUM CHLORIDE 0.9% FLUSH
3.0000 mL | Freq: Two times a day (BID) | INTRAVENOUS | Status: DC
  Administered 2016-09-07: 3 mL via INTRAVENOUS

## 2016-09-07 MED ORDER — THROMBIN 5000 UNITS EX SOLR
CUTANEOUS | Status: AC
  Filled 2016-09-07: qty 5000

## 2016-09-07 MED ORDER — LIDOCAINE 2% (20 MG/ML) 5 ML SYRINGE
INTRAMUSCULAR | Status: AC
  Filled 2016-09-07: qty 5

## 2016-09-07 MED ORDER — ARTIFICIAL TEARS OPHTHALMIC OINT
TOPICAL_OINTMENT | OPHTHALMIC | Status: DC | PRN
  Administered 2016-09-07: 1 via OPHTHALMIC

## 2016-09-07 MED ORDER — THROMBIN 5000 UNITS EX SOLR
OROMUCOSAL | Status: DC | PRN
  Administered 2016-09-07: 5 mL via TOPICAL

## 2016-09-07 MED ORDER — LIDOCAINE-EPINEPHRINE 1 %-1:100000 IJ SOLN
INTRAMUSCULAR | Status: DC | PRN
Start: 2016-09-07 — End: 2016-09-07
  Administered 2016-09-07: 10 mL

## 2016-09-07 MED ORDER — SUGAMMADEX SODIUM 200 MG/2ML IV SOLN
INTRAVENOUS | Status: DC | PRN
  Administered 2016-09-07: 169.6 mg via INTRAVENOUS

## 2016-09-07 MED ORDER — LACTATED RINGERS IV SOLN
INTRAVENOUS | Status: DC | PRN
  Administered 2016-09-07 (×2): via INTRAVENOUS

## 2016-09-07 MED ORDER — CEFAZOLIN SODIUM-DEXTROSE 2-4 GM/100ML-% IV SOLN
INTRAVENOUS | Status: AC
  Filled 2016-09-07: qty 100

## 2016-09-07 MED ORDER — SODIUM CHLORIDE 0.9% FLUSH: 3.0000 mL | INTRAVENOUS | Status: DC | PRN

## 2016-09-07 SURGICAL SUPPLY — 83 items
BAG DECANTER FOR FLEXI CONT (MISCELLANEOUS) ×3 IMPLANT
BENZOIN TINCTURE PRP APPL 2/3 (GAUZE/BANDAGES/DRESSINGS) ×3 IMPLANT
BLADE CLIPPER SURG (BLADE) IMPLANT
BLADE SURG 11 STRL SS (BLADE) ×3 IMPLANT
BONE VIVIGEN FORMABLE 5.4CC (Bone Implant) ×3 IMPLANT
BUR CUTTER 7.0 ROUND (BURR) ×3 IMPLANT
BUR MATCHSTICK NEURO 3.0 LAGG (BURR) ×3 IMPLANT
CANISTER SUCT 3000ML PPV (MISCELLANEOUS) ×3 IMPLANT
CAP LOCKING (Cap) ×8 IMPLANT
CAP LOCKING 5.5 CREO (Cap) ×4 IMPLANT
CARTRIDGE OIL MAESTRO DRILL (MISCELLANEOUS) ×1 IMPLANT
CLOSURE WOUND 1/2 X4 (GAUZE/BANDAGES/DRESSINGS) ×1
CONT SPEC 4OZ CLIKSEAL STRL BL (MISCELLANEOUS) ×3 IMPLANT
COVER BACK TABLE 60X90IN (DRAPES) ×3 IMPLANT
DECANTER SPIKE VIAL GLASS SM (MISCELLANEOUS) ×3 IMPLANT
DERMABOND ADVANCED (GAUZE/BANDAGES/DRESSINGS) ×2
DERMABOND ADVANCED .7 DNX12 (GAUZE/BANDAGES/DRESSINGS) ×1 IMPLANT
DIFFUSER DRILL AIR PNEUMATIC (MISCELLANEOUS) ×3 IMPLANT
DRAPE C-ARM 42X72 X-RAY (DRAPES) ×3 IMPLANT
DRAPE C-ARMOR (DRAPES) ×3 IMPLANT
DRAPE HALF SHEET 40X57 (DRAPES) IMPLANT
DRAPE LAPAROTOMY 100X72X124 (DRAPES) ×3 IMPLANT
DRAPE POUCH INSTRU U-SHP 10X18 (DRAPES) ×3 IMPLANT
DRAPE SURG 17X23 STRL (DRAPES) ×3 IMPLANT
DRSG OPSITE 4X5.5 SM (GAUZE/BANDAGES/DRESSINGS) ×3 IMPLANT
DRSG OPSITE POSTOP 4X6 (GAUZE/BANDAGES/DRESSINGS) ×3 IMPLANT
DURAPREP 26ML APPLICATOR (WOUND CARE) ×3 IMPLANT
ELECT REM PT RETURN 9FT ADLT (ELECTROSURGICAL) ×3
ELECTRODE REM PT RTRN 9FT ADLT (ELECTROSURGICAL) ×1 IMPLANT
EVACUATOR 3/16  PVC DRAIN (DRAIN) ×2
EVACUATOR 3/16 PVC DRAIN (DRAIN) ×1 IMPLANT
GAUZE SPONGE 4X4 12PLY STRL (GAUZE/BANDAGES/DRESSINGS) IMPLANT
GAUZE SPONGE 4X4 12PLY STRL LF (GAUZE/BANDAGES/DRESSINGS) ×3 IMPLANT
GAUZE SPONGE 4X4 16PLY XRAY LF (GAUZE/BANDAGES/DRESSINGS) IMPLANT
GLOVE BIO SURGEON STRL SZ7 (GLOVE) ×3 IMPLANT
GLOVE BIO SURGEON STRL SZ8 (GLOVE) ×9 IMPLANT
GLOVE BIOGEL PI IND STRL 7.0 (GLOVE) ×2 IMPLANT
GLOVE BIOGEL PI IND STRL 7.5 (GLOVE) ×1 IMPLANT
GLOVE BIOGEL PI INDICATOR 7.0 (GLOVE) ×4
GLOVE BIOGEL PI INDICATOR 7.5 (GLOVE) ×2
GLOVE ECLIPSE 7.5 STRL STRAW (GLOVE) IMPLANT
GLOVE EXAM NITRILE LRG STRL (GLOVE) IMPLANT
GLOVE EXAM NITRILE XL STR (GLOVE) IMPLANT
GLOVE EXAM NITRILE XS STR PU (GLOVE) IMPLANT
GLOVE INDICATOR 7.0 STRL GRN (GLOVE) ×9 IMPLANT
GLOVE INDICATOR 8.5 STRL (GLOVE) ×6 IMPLANT
GLOVE SURG SS PI 7.0 STRL IVOR (GLOVE) ×9 IMPLANT
GLOVE SURG SS PI 7.5 STRL IVOR (GLOVE) ×3 IMPLANT
GOWN STRL REUS W/ TWL LRG LVL3 (GOWN DISPOSABLE) ×3 IMPLANT
GOWN STRL REUS W/ TWL XL LVL3 (GOWN DISPOSABLE) ×3 IMPLANT
GOWN STRL REUS W/TWL 2XL LVL3 (GOWN DISPOSABLE) IMPLANT
GOWN STRL REUS W/TWL LRG LVL3 (GOWN DISPOSABLE) ×6
GOWN STRL REUS W/TWL XL LVL3 (GOWN DISPOSABLE) ×6
HEMOSTAT POWDER KIT SURGIFOAM (HEMOSTASIS) ×3 IMPLANT
KIT BASIN OR (CUSTOM PROCEDURE TRAY) ×3 IMPLANT
KIT INFUSE XX SMALL 0.7CC (Orthopedic Implant) ×3 IMPLANT
KIT ROOM TURNOVER OR (KITS) ×3 IMPLANT
MILL MEDIUM DISP (BLADE) ×3 IMPLANT
NEEDLE HYPO 21X1.5 SAFETY (NEEDLE) ×3 IMPLANT
NEEDLE HYPO 25X1 1.5 SAFETY (NEEDLE) ×3 IMPLANT
NS IRRIG 1000ML POUR BTL (IV SOLUTION) ×6 IMPLANT
OIL CARTRIDGE MAESTRO DRILL (MISCELLANEOUS) ×3
PACK LAMINECTOMY NEURO (CUSTOM PROCEDURE TRAY) ×3 IMPLANT
PAD ARMBOARD 7.5X6 YLW CONV (MISCELLANEOUS) ×9 IMPLANT
ROD SPINAL 35MM (Rod) ×6 IMPLANT
SCREW CORT CREO 6.0-5.0X30MM (Screw) ×6 IMPLANT
SCREW MOD 6.0-5.0X35MM (Screw) ×3 IMPLANT
SHAFT CREO 30MM (Neuro Prosthesis/Implant) ×3 IMPLANT
SPACER RISE 10X22 8-14MM-10 (Spacer) ×6 IMPLANT
SPONGE LAP 4X18 X RAY DECT (DISPOSABLE) IMPLANT
SPONGE SURGIFOAM ABS GEL 100 (HEMOSTASIS) ×3 IMPLANT
STRIP CLOSURE SKIN 1/2X4 (GAUZE/BANDAGES/DRESSINGS) ×2 IMPLANT
SUT VIC AB 0 CT1 18XCR BRD8 (SUTURE) ×2 IMPLANT
SUT VIC AB 0 CT1 8-18 (SUTURE) ×4
SUT VIC AB 2-0 CT1 18 (SUTURE) ×3 IMPLANT
SUT VIC AB 4-0 PS2 27 (SUTURE) ×3 IMPLANT
SYR CONTROL 10ML LL (SYRINGE) ×3 IMPLANT
SYRINGE 20CC LL (MISCELLANEOUS) ×3 IMPLANT
TOWEL GREEN STERILE (TOWEL DISPOSABLE) ×3 IMPLANT
TOWEL GREEN STERILE FF (TOWEL DISPOSABLE) ×3 IMPLANT
TRAY FOLEY W/METER SILVER 16FR (SET/KITS/TRAYS/PACK) ×3 IMPLANT
TULIP CREP AMP 5.5MM (Orthopedic Implant) ×6 IMPLANT
WATER STERILE IRR 1000ML POUR (IV SOLUTION) ×3 IMPLANT

## 2016-09-07 NOTE — Anesthesia Postprocedure Evaluation (Signed)
Anesthesia Post Note  Patient: Stacy Kirk  Procedure(s) Performed: Procedure(s) (LRB): Posterior Lumbar Interbody Fusion Lumbar four-five (N/A)     Patient location during evaluation: PACU Anesthesia Type: General Level of consciousness: oriented and sedated Pain management: pain level controlled Vital Signs Assessment: post-procedure vital signs reviewed and stable Respiratory status: spontaneous breathing, nonlabored ventilation, respiratory function stable and patient connected to nasal cannula oxygen Cardiovascular status: blood pressure returned to baseline and stable Postop Assessment: no signs of nausea or vomiting Anesthetic complications: no    Last Vitals:  Vitals:   09/07/16 1215 09/07/16 1219  BP:  (!) 151/81  Pulse: 87 88  Resp: 15 14  Temp:      Last Pain:  Vitals:   09/07/16 1230  TempSrc:   PainSc: Asleep                 ,JAMES TERRILL

## 2016-09-07 NOTE — H&P (Signed)
Stacy Kirk is an 71 y.o. female.   Chief Complaint: Back and bilateral leg pain with neurogenic claudication HPI: 71 year old female with long-standing back and bilateral leg pain only able to walk short distances this is been refractory to all forms of conservative treatment workup revealed severe lumbar spinal stenosis degenerative disc disease marked facet arthropathy and a stasis of the facet joints. Due to her failure conservative treatment imaging findings progression of clinical syndrome I recommended decompression stabilization procedure. I extensively went over the risks and benefits of the operation with the patient as well as perioperative course expectations of outcome and alternatives to surgery and she understood and agreed to proceed forward.  Past Medical History:  Diagnosis Date  . Arthritis   . Cancer ([place])    ovarian, skin face- skin cancer  . Complication of anesthesia    slow to awaken everday, even slower with anesthesia  . Constipation   . Diabetes mellitus    Type II  . GERD (gastroesophageal reflux disease)   . Headache 05/12/2015   "after a fall" headache for 8 months  . Hemorrhoids   . Hernia   . History of kidney stones    passed  . Hypertension   . Hypothyroidism   . Loss of appetite   . Muscle spasm    back  . Osteoporosis   . Redness    abd wound  . Urine frequency    unable to hold urine   . Wears hearing aid    bilateral     Past Surgical History:  Procedure Laterality Date  . ABDOMINAL HYSTERECTOMY    . APPENDECTOMY    . CHOLECYSTECTOMY    . COLONOSCOPY W/ POLYPECTOMY    . HERNIA REPAIR  Jun2012   open component separation w biologic mesh (Dr. Brantley Stage)  . TOE SURGERY Left    2nd toe  . TONSILLECTOMY      Family History  Problem Relation Age of Onset  . Diabetes Mother    Social History:  reports that she has quit smoking. She quit after 15.00 years of use. She has never used smokeless tobacco. She reports that she does not  drink alcohol or use drugs.  Allergies:  Allergies  Allergen Reactions  . Erythromycin Other (See Comments)    "tears" her stomach up. Major digestive upset.  . Tramadol Shortness Of Breath and Other (See Comments)    Felt like lungs filled up; unable to lay down  . Albumin (Human) Other (See Comments)    Pt unsure of reaction  . Aspirin Itching and Other (See Comments)    Cause lethargy  . Codeine Itching    Cause lethargy  . Cortisone Other (See Comments)    NO STEROID INJECTIONS; headaches  . Propoxyphene Other (See Comments)    Terrible reaction; headaches  . Simvastatin Itching and Other (See Comments)    Severe muscle aches  . Triamcinolone Acetonide Other (See Comments)    headache    Medications Prior to Admission  Medication Sig Dispense Refill  . acetaminophen (TYLENOL) 500 MG tablet Take 1,000 mg by mouth every 6 (six) hours as needed (FOR PAIN.).    Marland Kitchen aspirin EC 81 MG tablet Take 81 mg by mouth every evening.    . baclofen (LIORESAL) 20 MG tablet Take 10 mg by mouth daily as needed for muscle spasms.    Marland Kitchen docusate sodium (COLACE) 100 MG capsule Take 100 mg by mouth daily at 8 pm.    .  hydrochlorothiazide (HYDRODIURIL) 25 MG tablet Take 25 mg by mouth daily.  6  . Hydrocortisone (UUVOZDGUY-40 EX) Apply 1 application topically daily as needed (for itchy skin).    Marland Kitchen ibuprofen (ADVIL,MOTRIN) 200 MG tablet Take 400 mg by mouth every 8 (eight) hours as needed (FOR PAIN).    Marland Kitchen levothyroxine (SYNTHROID, LEVOTHROID) 100 MCG tablet Take 100 mcg by mouth daily before breakfast.     . LINZESS 145 MCG CAPS capsule Take 145 mcg by mouth daily as needed. For constipation (typically twice a week); if not relieved by linzess will eat prunes or drink prune juice  3  . magnesium gluconate (MAGONATE) 500 MG tablet Take 500 mg by mouth daily.    . Menthol, Topical Analgesic, (BENGAY EX) Apply 1 application topically 4 (four) times daily as needed (for back pain).    . Menthol-Methyl  Salicylate (BENGAY GREASELESS EX) Apply topically.    . metFORMIN (GLUCOPHAGE) 500 MG tablet Take 500 mg by mouth daily after supper.  6  . metoprolol succinate (TOPROL-XL) 25 MG 24 hr tablet Take 25 mg by mouth every evening.  2  . Multiple Vitamin (MULTIVITAMIN WITH MINERALS) TABS tablet Take 1 tablet by mouth daily. ESSENTIAL ONE SUPPLEMENT    . Multiple Vitamins-Minerals (HAIR/SKIN/NAILS PO) Take 1 capsule by mouth daily. HEALTHY HAIR,SKIN & NAILS    . Propylene Glycol ([employer] LUBRICANT DROPS) 0.6 % SOLN Place 1 drop into both eyes 3 (three) times daily as needed (for dry eyes).      Results for orders placed or performed during the hospital encounter of 09/07/16 (from the past 48 hour(s))  Glucose, capillary     Status: Abnormal   Collection Time: 09/07/16  7:06 AM  Result Value Ref Range   Glucose-Capillary 119 (H) 65 - 99 mg/dL   Comment 1 Notify RN    Comment 2 Document in Chart    No results found.  Review of Systems  Musculoskeletal: Positive for back pain, joint pain and myalgias.  Neurological: Positive for tingling and sensory change.  All other systems reviewed and are negative.   Blood pressure (!) 159/85, pulse 79, temperature 97.8 F (36.6 C), temperature source Oral, resp. rate 18, height 5\' 2"  (1.575 m), weight 84.8 kg (187 lb), SpO2 95 %. Physical Exam  Constitutional: She is oriented to person, place, and time. She appears well-developed and well-nourished.  HENT:  Head: Normocephalic.  Eyes: Pupils are equal, round, and reactive to light.  Neck: Normal range of motion.  GI: Soft.  Neurological: She is alert and oriented to person, place, and time. She has normal strength. GCS eye subscore is 4. GCS verbal subscore is 5. GCS motor subscore is 6.  Strength is 5 out of 5 iliopsoas, quads, hamstrings, gastrocs, and tibialis, and EHL.  Skin: Skin is warm and dry.     Assessment/Plan 71 year old presents for decompression stabilization procedure at  L4-5.  , P, MD 09/07/2016, 8:11 AM

## 2016-09-07 NOTE — Op Note (Signed)
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  Postoperative diagnosis: Same  Procedure: #1 decompressive lumbar laminectomy in excess and requiring more work to would be needed with a standard interbody fusion L4-5. With complete facetectomies radical foraminotomies of the L4 and L5 nerve roots.  #2 posterior lumbar interbody fusion using the globus expandable cage system packed with locally harvested autograft mixed with vivgen and BMP  #3 cortical screw fixation utilizing the globus Creole and modular cortical screw system with 6050 by 30 and 35 mm screws at L4 and L5 bilaterally  #4 open reduction spinal 40.  Surgeon: Dominica Severin   Asst.: Glenford Peers  Anesthesia: Gen.  EBL: Minimal  History of present illness: 71 year old female with long-standing back and bilateral leg pain and neurogenic claudication workup revealed severe spinal stenosis at L4-5. Due to patient's progression of clinical syndrome imaging findings showing severe stenosis and a grade 1 spinal listhesis I recommended decompression stable position procedure at L4-5 extensively went over the risks and benefits of the operation with her as well as perioperative course expectations of outcome alternatives of surgery and she understood and agreed to proceed forward.  Operative procedure: Patient brought into the or was induced under general anesthesia positioned prone on the Wilson frame her back was prepped and draped in routine sterile fashion preoperative x-ray localize the appropriate level so after infiltration of 10 mL lidocaine with epi a midline incision was made and Bovie light cautery was used to calcification subperiosteal dissections care lamina of L4 and L5 bilaterally. Interoperative x-ray confirmed with desiccation appropriate level. Then the spinous process at L4 was removed central decompression was begun complete medial facetectomies were  performed there was marked hourglass compression of thecal sac at the L4-5 disc space level. This is all teased off of the dura and removed in piecemeal fashion under biting of the superior tickling process allowed extensive decompression of the distal L4 exiting nerve root. The proximal L5 foramen was widely opened up and unroofed. Lateral aspect disc space was exposed. Then the space was incised bilaterally sequential distraction was placed with a 9 distractor in place 8 expandable cages were selected the spaces cleanout bilaterally endplates were prepared bilaterally initial 814 expandable 10 lordotic cage was inserted local autograft mixed with BMP and division was packed centrally and in the contralateral cage was placed. Fluoroscopy was used the step along the way confirm and placement. Then testing the cortical screw placement again under fluoroscopy pilot holes were drilled pedicles were drilled tapped and cortical screws were placed. All screws excellent purchase then 235 mm rods were selected top tightness were anchored in place the foraminal reinspected to confirm no migration of graft material Gelfoam was laid top of the dura large Hemovac drain was placed and the wound was sprinkled with vancomycin powder fascia was injected with experell. And the wounds closed in layers with after Vicryl and a running 4 subcuticular. Dermabond benzo and Steri-Strips and sterile dressings applied patient recovered in stable condition. At the end the case all needle counts sponge counts were correct.

## 2016-09-07 NOTE — Evaluation (Signed)
Physical Therapy Evaluation Patient Details Name: Stacy Kirk MRN: [id number] DOB: [birth year] Today's Date: 09/07/2016   History of Present Illness  Pt is a 71 y/o female who presents s/p L4-L5 PLIF on 09/07/16. PMH significant for osteoporosis, HTN, hernia, DM, CA, anesthesia complication - slow to awaken everyday but slower with anesthesia.   Clinical Impression  Pt admitted with above diagnosis. Pt currently with functional limitations due to the deficits listed below (see PT Problem List). At the time of PT eval pt was very sleepy and lethargic - likely from anesthesia given PMH. Pt was educated on precautions and general safety with mobility. With transition to supine, pt got very nauseated and was not able to tolerate bed mobility training. Pt will benefit from skilled PT to increase their independence and safety with mobility to allow discharge to the venue listed below. Anticipate pt will progress well once more alert.     Follow Up Recommendations Home health PT;Supervision/Assistance - 24 hour    Equipment Recommendations  None recommended by PT    Recommendations for Other Services       Precautions / Restrictions Precautions Precautions: Fall;Back Precaution Booklet Issued: Yes (comment) Precaution Comments: Reviewed in detail with pt. Will need further education and review.  Required Braces or Orthoses: Spinal Brace Spinal Brace: Lumbar corset;Applied in sitting position (Not present during eval. Should be delivered 6/13 in PM) Restrictions Weight Bearing Restrictions: No      Mobility  Bed Mobility Overal bed mobility: Needs Assistance             General bed mobility comments: Attempted bed mobility with pt - she was able to bend both knees up so feet were flat in bed, however was not able to tolerate lowering HOB to practice log roll technique. Pt got very nauseated and could not tolerate change in position at this time.   Transfers                  General transfer comment: Unable to tolerate  Ambulation/Gait                Stairs            Wheelchair Mobility    Modified Rankin (Stroke Patients Only)       Balance                                             Pertinent Vitals/Pain Pain Assessment: Faces Faces Pain Scale: Hurts little more Pain Location: Incision site Pain Descriptors / Indicators: Operative site guarding Pain Intervention(s): Limited activity within patient's tolerance;Monitored during session    Home Living Family/patient expects to be discharged to:: Private residence Living Arrangements: Spouse/significant other;Children (dependent child) Available Help at Discharge: Family;Available 24 hours/day Type of Home: House Home Access: Stairs to enter Entrance Stairs-Rails: Right Entrance Stairs-Number of Steps: 3 Home Layout: One level Home Equipment: Walker - 2 wheels;Grab bars - tub/shower      Prior Function           Comments: Unsure of PLOF. Pt a poor historian at this time.      Hand Dominance        Extremity/Trunk Assessment   Upper Extremity Assessment Upper Extremity Assessment: Defer to OT evaluation    Lower Extremity Assessment Lower Extremity Assessment: Generalized weakness    Cervical / Trunk Assessment Cervical /  Trunk Assessment:  (Unable to be assessed)  Communication   Communication: Expressive difficulties (likely due to anesthesia)  Cognition Arousal/Alertness: Lethargic;Suspect due to medications Behavior During Therapy: Flat affect Overall Cognitive Status: Impaired/Different from baseline Area of Impairment: Orientation;Attention;Memory;Following commands;Awareness;Problem solving;Safety/judgement                 Orientation Level: Disoriented to;Place;Time Current Attention Level: Focused Memory: Decreased short-term memory;Decreased recall of precautions Following Commands: Follows one step commands with  increased time Safety/Judgement: Decreased awareness of safety;Decreased awareness of deficits Awareness: Intellectual Problem Solving: Slow processing;Decreased initiation;Difficulty sequencing;Requires verbal cues;Requires tactile cues General Comments: Likely due to anesthesia      General Comments      Exercises     Assessment/Plan    PT Assessment Patient needs continued PT services  PT Problem List Decreased strength;Decreased range of motion;Decreased activity tolerance;Decreased mobility;Decreased balance;Decreased knowledge of use of DME;Decreased safety awareness;Decreased knowledge of precautions;Pain       PT Treatment Interventions DME instruction;Gait training;Stair training;Functional mobility training;Therapeutic activities;Therapeutic exercise;Neuromuscular re-education;Patient/family education    PT Goals (Current goals can be found in the Care Plan section)  Acute Rehab PT Goals Patient Stated Goal: Go to sleep PT Goal Formulation: With patient Time For Goal Achievement: 09/14/16 Potential to Achieve Goals: Good    Frequency Min 5X/week   Barriers to discharge        Co-evaluation               AM-PAC PT "6 Clicks" Daily Activity  Outcome Measure Difficulty turning over in bed (including adjusting bedclothes, sheets and blankets)?: Total Difficulty moving from lying on back to sitting on the side of the bed? : Total Difficulty sitting down on and standing up from a chair with arms (e.g., wheelchair, bedside commode, etc,.)?: Total Help needed moving to and from a bed to chair (including a wheelchair)?: Total Help needed walking in hospital room?: Total Help needed climbing 3-5 steps with a railing? : Total 6 Click Score: 6    End of Session   Activity Tolerance: Patient limited by lethargy Patient left: in bed;with call bell/phone within reach Nurse Communication: Mobility status PT Visit Diagnosis: Pain;Other abnormalities of gait and  mobility (R26.89) Pain - part of body:  (Back)    Time: 5638-9373 PT Time Calculation (min) (ACUTE ONLY): 15 min   Charges:   PT Evaluation $PT Eval Moderate Complexity: 1 Procedure     PT G Codes:        Rolinda Roan, PT, DPT Acute Rehabilitation Services Pager: [phone]   Thelma Comp 09/07/2016, 2:56 PM

## 2016-09-07 NOTE — Anesthesia Preprocedure Evaluation (Addendum)
Anesthesia Evaluation  Patient identified by MRN, date of birth, ID band Patient awake    Reviewed: Allergy & Precautions, NPO status , Patient's Chart, lab work & pertinent test results  Airway Mallampati: III  TM Distance: <3 FB Neck ROM: Limited    Dental no notable dental hx. (+) Teeth Intact, Dental Advisory Given   Pulmonary former smoker,    breath sounds clear to auscultation       Cardiovascular hypertension, Pt. on medications and Pt. on home beta blockers  Rhythm:Regular Rate:Normal     Neuro/Psych  Headaches,    GI/Hepatic GERD  Controlled,  Endo/Other  diabetesHypothyroidism Morbid obesity  Renal/GU      Musculoskeletal  (+) Arthritis ,   Abdominal (+) + obese,   Peds  Hematology   Anesthesia Other Findings   Reproductive/Obstetrics                          Anesthesia Physical Anesthesia Plan  ASA: III  Anesthesia Plan: General   Post-op Pain Management:    Induction: Intravenous  PONV Risk Score and Plan: 4 or greater and Ondansetron, Dexamethasone, Propofol and Midazolam  Airway Management Planned:   Additional Equipment:   Intra-op Plan:   Post-operative Plan: Extubation in OR  Informed Consent: I have reviewed the patients History and Physical, chart, labs and discussed the procedure including the risks, benefits and alternatives for the proposed anesthesia with the patient or authorized representative who has indicated his/her understanding and acceptance.   Dental advisory given  Plan Discussed with: CRNA  Anesthesia Plan Comments:         Anesthesia Quick Evaluation

## 2016-09-07 NOTE — Transfer of Care (Signed)
Immediate Anesthesia Transfer of Care Note  Patient: Stacy Kirk  Procedure(s) Performed: Procedure(s): Posterior Lumbar Interbody Fusion Lumbar four-five (N/A)  Patient Location: PACU  Anesthesia Type:General  Level of Consciousness: awake, oriented and patient cooperative  Airway & Oxygen Therapy: Patient Spontanous Breathing and Patient connected to face mask oxygen  Post-op Assessment: Report given to RN and Post -op Vital signs reviewed and stable  Post vital signs: Reviewed  Last Vitals:  Vitals:   09/07/16 0701  BP: (!) 159/85  Pulse: 79  Resp: 18  Temp: 36.6 C    Last Pain:  Vitals:   09/07/16 0735  TempSrc:   PainSc: 3       Patients Stated Pain Goal: 2 (94/58/59 2924)  Complications: No apparent anesthesia complications

## 2016-09-07 NOTE — Anesthesia Procedure Notes (Signed)
Procedure Name: Intubation Date/Time: 09/07/2016 8:44 AM Performed by: Jenne Campus Pre-anesthesia Checklist: Patient identified, Emergency Drugs available, Suction available and Patient being monitored Patient Re-evaluated:Patient Re-evaluated prior to inductionOxygen Delivery Method: Circle System Utilized Preoxygenation: Pre-oxygenation with 100% oxygen Intubation Type: IV induction Ventilation: Mask ventilation without difficulty Laryngoscope Size: Miller and 2 Grade View: Grade II Tube type: Oral Tube size: 7.0 mm Number of attempts: 1 Airway Equipment and Method: Stylet and Oral airway Placement Confirmation: ETT inserted through vocal cords under direct vision,  positive ETCO2 and breath sounds checked- equal and bilateral Secured at: 21 cm Tube secured with: Tape Dental Injury: Teeth and Oropharynx as per pre-operative assessment

## 2016-09-08 LAB — GLUCOSE, CAPILLARY: GLUCOSE-CAPILLARY: 110 mg/dL — AB (ref 65–99)

## 2016-09-08 NOTE — Progress Notes (Signed)
Patient ID: Stacy Kirk, female   DOB: [birth year], 71 y.o.   MRN: [id number] Doing well no leg pain back pain well controlled on Tylenol only  Strength out of 5 wound clean dry and intact  DC Hemovac DC home

## 2016-09-08 NOTE — Care Management Note (Signed)
Case Management Note  Patient Details  Name: Stacy Kirk MRN: [id number] Date of Birth: 1945-08-31  Subjective/Objective:   Pt is a 71 y/o female  s/p L4-L5 PLIF on 09/07/16                Action/Plan: Case manager spoke with patient concerning discharge plan. Choice was offered for [facility], patient says she used [facility] before and would like to do so now. CM called referral to Stacy Kirk, [facility] Liaison. Has no DME needs, patient has a cane. She says that she lives with her disabled son and  husband, her husband will be providing care for the son while patient recovers. Patient states she will have to care for herself because he isn't able to assist patient and provide care for the son as well. Patient says she will be mindful of back precautions and will not be lifting her son.   Expected Discharge Date:    09/08/16              Expected Discharge Plan:  [place]  In-House Referral:  NA  Discharge planning Services  CM Consult  Post Acute Care Choice:  Home Health Choice offered to:  Patient  DME Arranged:  N/A DME Agency:  NA  HH Arranged:  PT [place] Agency:  [facility]  Status of Service:  Completed, signed off  If discussed at [place] of Stay Meetings, dates discussed:    Additional Comments:  Stacy Meeker, RN 09/08/2016, 8:53 AM

## 2016-09-08 NOTE — Discharge Summary (Signed)
Physician Discharge Summary  Patient ID: Stacy Kirk MRN: [id number] DOB/AGE: Apr 28, 1945 71 y.o.  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  Consults: Significant Diagnostic Studies: Treatments: Decompression fusion L4-5 Discharge Exam: Blood pressure 116/69, pulse 88, temperature 98.7 F (37.1 C), temperature source Oral, resp. rate 18, height 5\' 2"  (1.575 m), weight 84.8 kg (187 lb), SpO2 95 %. Strength out of 5 wound clean dry and intact  Disposition: Home   Allergies as of 09/08/2016      Reactions   Erythromycin Other (See Comments)   "tears" her stomach up. Major digestive upset.   Tramadol Shortness Of Breath, Other (See Comments)   Felt like lungs filled up; unable to lay down   Albumin (human) Other (See Comments)   Pt unsure of reaction   Aspirin Itching, Other (See Comments)   Cause lethargy   Codeine Itching   Cause lethargy   Cortisone Other (See Comments)   NO STEROID INJECTIONS; headaches   Propoxyphene Other (See Comments)   Terrible reaction; headaches   Simvastatin Itching, Other (See Comments)   Severe muscle aches   Triamcinolone Acetonide Other (See Comments)   headache      Medication List    TAKE these medications   acetaminophen 500 MG tablet Commonly known as:  TYLENOL Take 1,000 mg by mouth every 6 (six) hours as needed (FOR PAIN.).   aspirin EC 81 MG tablet Take 81 mg by mouth every evening.   baclofen 20 MG tablet Commonly known as:  LIORESAL Take 10 mg by mouth daily as needed for muscle spasms.   BENGAY  EX Apply 1 application topically 4 (four) times daily as needed (for back pain).   BENGAY GREASELESS EX Apply topically.   DUKGURKYH-06 EX Apply 1 application topically daily as needed (for itchy skin).   [employer] LUBRICANT DROPS 0.6 % Soln Generic drug:  Propylene Glycol Place 1 drop into both eyes 3 (three) times daily as needed (for dry eyes).   docusate sodium 100 MG capsule Commonly known as:  COLACE Take 100 mg by mouth daily at 8 pm.   HAIR/SKIN/NAILS PO Take 1 capsule by mouth daily. HEALTHY HAIR,SKIN & NAILS   hydrochlorothiazide 25 MG tablet Commonly known as:  HYDRODIURIL Take 25 mg by mouth daily.   ibuprofen 200 MG tablet Commonly known as:  ADVIL,MOTRIN Take 400 mg by mouth every 8 (eight) hours as needed (FOR PAIN).   levothyroxine 100 MCG tablet Commonly known as:  SYNTHROID, LEVOTHROID Take 100 mcg by mouth daily before breakfast.   LINZESS 145 MCG Caps capsule Generic drug:  linaclotide Take 145 mcg by mouth daily as needed. For constipation (typically twice a week); if not relieved by linzess will eat prunes or drink prune juice   magnesium gluconate 500 MG tablet Commonly known as:  MAGONATE Take 500 mg by mouth daily.   metFORMIN 500 MG tablet Commonly known as:  GLUCOPHAGE Take 500 mg by mouth daily after supper.   metoprolol succinate 25 MG 24 hr tablet Commonly known as:  TOPROL-XL Take 25 mg by mouth every evening.   multivitamin with minerals Tabs tablet Take 1 tablet by  mouth daily. ESSENTIAL ONE SUPPLEMENT      Follow-up Information    [facility] Follow [facility].   Why:  A representative from [facility] will contact you to arrange start date and time for your therapy. Contact information: [place] [address] [phone]        Kary Kos, MD Follow up.   Specialty:  Neurosurgery Contact information: [address] [place] [address] [phone]            Signed: Elaina Hoops 09/08/2016, 9:56 AM

## 2016-09-08 NOTE — Progress Notes (Signed)
Physical Therapy Treatment Patient Details Name: Stacy Kirk MRN: [id number] DOB: October 09, 1945 Today's Date: 09/08/2016    History of Present Illness Pt is a 71 y/o female who presents s/p L4-L5 PLIF on 09/07/16. PMH significant for osteoporosis, HTN, hernia, DM, CA, anesthesia complication - slow to awaken everyday but slower with anesthesia.     PT Comments    Pt progressing towards physical therapy goals. Pt was educated on precautions and safe mobility upon d/c. Pt was also educated on proper posture while assisting dependent adult son and risks of lifting him upon return home. Pt strongly declining use of RW and stair training during this session. Max encouragement provided for participation for safety, however pt continued to refuse. Will continue to follow. If pt decides she does want to practice these tasks before d/c, please page PT.   Follow Up Recommendations  Home health PT;Supervision/Assistance - 24 hour     Equipment Recommendations  None recommended by PT    Recommendations for Other Services       Precautions / Restrictions Precautions Precautions: Fall;Back Precaution Booklet Issued: No Precaution Comments: Educated pt on back precautions Required Braces or Orthoses: Spinal Brace Spinal Brace: Lumbar corset;Applied in sitting position Restrictions Weight Bearing Restrictions: No    Mobility  Bed Mobility               General bed mobility comments: Pt OOB in chair upon arrival  Transfers Overall transfer level: Needs assistance Equipment used: Straight cane Transfers: Sit to/from Stand Sit to Stand: Min guard         General transfer comment: for safety, no physical assist. Good technique with use of cane  Ambulation/Gait Ambulation/Gait assistance: Supervision;Min guard Ambulation Distance (Feet): 175 Feet Assistive device: Straight cane Gait Pattern/deviations: Step-to pattern;Step-through pattern;Decreased stride length;Trunk  flexed Gait velocity: Very slow Gait velocity interpretation: Below normal speed for age/gender General Gait Details: Pt refused RW "I don't like that thing and I will not use it." - however, pt reaching for outside support with free hand and had SPC for support in other hand. Hands-on guarding provided for most of gait training. Refused stair training even though pt reports feeling "scared" to go up the stairs into her home. Pt agreeable to verbally talk through sequencing and safety but would not agree to attempt stairs during session.    Stairs Stairs:  (Refused)          Wheelchair Mobility    Modified Rankin (Stroke Patients Only)       Balance Overall balance assessment: Needs assistance;History of Falls Sitting-balance support: Feet supported;No upper extremity supported Sitting balance-Leahy Scale: Good     Standing balance support: Single extremity supported;During functional activity Standing balance-Leahy Scale: Poor Standing balance comment: Dynamically                            Cognition Arousal/Alertness: Awake/alert Behavior During Therapy: WFL for tasks assessed/performed Overall Cognitive Status: Within Functional Limits for tasks assessed                                        Exercises      General Comments        Pertinent Vitals/Pain Pain Assessment: Faces Faces Pain Scale: Hurts even more Pain Location: back Pain Descriptors / Indicators: Grimacing;Guarding;Sore Pain Intervention(s): Monitored during session    Home  Living                      Prior Function            PT Goals (current goals can now be found in the care plan section) Acute Rehab PT Goals Patient Stated Goal: home today "I'm checking myself out" PT Goal Formulation: With patient Time For Goal Achievement: 09/14/16 Potential to Achieve Goals: Good Progress towards PT goals: Progressing toward goals    Frequency    Min  5X/week      PT Plan Current plan remains appropriate    Co-evaluation              AM-PAC PT "6 Clicks" Daily Activity  Outcome Measure  Difficulty turning over in bed (including adjusting bedclothes, sheets and blankets)?: Total Difficulty moving from lying on back to sitting on the side of the bed? : Total Difficulty sitting down on and standing up from a chair with arms (e.g., wheelchair, bedside commode, etc,.)?: Total Help needed moving to and from a bed to chair (including a wheelchair)?: Total Help needed walking in hospital room?: Total Help needed climbing 3-5 steps with a railing? : Total 6 Click Score: 6    End of Session Equipment Utilized During Treatment: Gait belt;Back brace Activity Tolerance: Patient limited by fatigue;Patient limited by pain Patient left: in chair;with call bell/phone within reach Nurse Communication: Mobility status PT Visit Diagnosis: Pain;Other abnormalities of gait and mobility (R26.89) Pain - part of body:  (Back)     Time: 8413-2440 PT Time Calculation (min) (ACUTE ONLY): 23 min  Charges:  $Gait Training: 23-37 mins                    G Codes:       Rolinda Roan, PT, DPT Acute Rehabilitation Services Pager: [place] 09/08/2016, 1:50 PM

## 2016-09-08 NOTE — Progress Notes (Signed)
Pt doing well. Pt given D/C instructions with verbal understanding. Pt's incision is clean and dry with no sign of infection. Pt's IV and Hemovac were removed prior to D/C. Pt's HH was arranged by CM prior to D/C. Pt D/C'd home via wheelchair with brother per MD order. Pt is stable @ D/C and has no other needs at this time. Holli Humbles, RN

## 2016-09-08 NOTE — Discharge Instructions (Signed)
No lifting no bending no twisting no driving no riding a car unless she is going back and forth to see me. Keep the incision clean dry and intact. May remove the outer dressing in 2-3 days leave the Steri-Strips on and intact. When the Steri-Strips expose piece cover with some watertight saran wrap or press and seal.

## 2016-09-08 NOTE — Evaluation (Signed)
Occupational Therapy Evaluation Patient Details Name: Stacy Kirk MRN: [id number] DOB: 23-Aug-1945 Today's Date: 09/08/2016    History of Present Illness Pt is a 71 y/o female who presents s/p L4-L5 PLIF on 09/07/16. PMH significant for osteoporosis, HTN, hernia, DM, CA, anesthesia complication - slow to awaken everyday but slower with anesthesia.    Clinical Impression   Pt reports she was independent with ADL PTA. Currently pt overall min guard for functional mobility and min assist for ADL. Began back, safety, and ADL education with pt. Pt reports her and her husband are the primary caregivers for her dependent adult son. Educated pt on importance of maintaining back precautions initially upon return home; pt reporting this will be difficult as she has to take care of her son. Pt planning to d/c home with 24/7 supervision but not assist from her husband. Recommending HHOT for follow up to maximize independence and safety with ADL and functional mobility. Pt would benefit from continued skilled OT to address established goals.    Follow Up Recommendations  Home health OT;Supervision/Assistance - 24 hour    Equipment Recommendations  None recommended by OT    Recommendations for Other Services       Precautions / Restrictions Precautions Precautions: Fall;Back Precaution Booklet Issued: No Precaution Comments: Educated pt on back precautions Required Braces or Orthoses: Spinal Brace Spinal Brace: Lumbar corset;Applied in sitting position Restrictions Weight Bearing Restrictions: No      Mobility Bed Mobility               General bed mobility comments: Pt OOB in chair upon arrival  Transfers Overall transfer level: Needs assistance Equipment used: Straight cane Transfers: Sit to/from Stand Sit to Stand: Min guard         General transfer comment: for safety, no physical assist. Good technique with use of cane    Balance Overall balance assessment: Needs  assistance;History of Falls Sitting-balance support: Feet supported;No upper extremity supported Sitting balance-Leahy Scale: Good     Standing balance support: Single extremity supported;During functional activity Standing balance-Leahy Scale: Fair                             ADL either performed or assessed with clinical judgement   ADL Overall ADL's : Needs assistance/impaired Eating/Feeding: Set up;Sitting   Grooming: Set up;Supervision/safety;Sitting Grooming Details (indicate cue type and reason): Educated on use of 2 cups for oral care Upper Body Bathing: Set up;Supervision/ safety;Sitting   Lower Body Bathing: Minimal assistance;Sit to/from stand   Upper Body Dressing : Minimal assistance;Sitting Upper Body Dressing Details (indicate cue type and reason): Educated on proper technique for donning back brace Lower Body Dressing: Minimal assistance;Sit to/from stand Lower Body Dressing Details (indicate cue type and reason): Pt able to don pants without assist, would require assist for socks/shoes that do not slip on Toilet Transfer: Min guard;Ambulation;Comfort height toilet (cane) Toilet Transfer Details (indicate cue type and reason): Simulated by sit to stand from chair with functional mobility in room   Toileting - Clothing Manipulation Details (indicate cue type and reason): Educated on proper technique for peri care without twisting and use of wet wipes     Functional mobility during ADLs: Min guard;Cane General ADL Comments: Educated pt on maintaining back precautions during functional activities, keeping frequently used items at counter top height, and frequent mobility throghout the day upon return home.     Vision  Perception     Praxis      Pertinent Vitals/Pain Pain Assessment: Faces Faces Pain Scale: Hurts even more Pain Location: back Pain Descriptors / Indicators: Grimacing;Guarding;Sore Pain Intervention(s): Monitored during  session;Limited activity within patient's tolerance     Hand Dominance     Extremity/Trunk Assessment Upper Extremity Assessment Upper Extremity Assessment: Overall WFL for tasks assessed   Lower Extremity Assessment Lower Extremity Assessment: Defer to PT evaluation   Cervical / Trunk Assessment Cervical / Trunk Assessment: Other exceptions Cervical / Trunk Exceptions: s/p spinal sx   Communication Communication Communication: No difficulties   Cognition Arousal/Alertness: Awake/alert Behavior During Therapy: WFL for tasks assessed/performed Overall Cognitive Status: Within Functional Limits for tasks assessed                                     General Comments       Exercises     Shoulder Instructions      Home Living Family/patient expects to be discharged to:: Private residence Living Arrangements: Spouse/significant other;Children (dependent adult child) Available Help at Discharge: Family;Available 24 hours/day Type of Home: House Home Access: Ramped entrance     Home Layout: One level     Bathroom Shower/Tub: Walk-in shower (zero entry)   Bathroom Toilet: Handicapped height Bathroom Accessibility: Yes How Accessible: Accessible via wheelchair Home Equipment: Walker - 2 wheels;Grab bars - tub/shower;Cane - single point;Grab bars - toilet;Adaptive equipment Adaptive Equipment: Reacher;Sock aid Additional Comments: Reports husband unable to assist her physically.      Prior Functioning/Environment Level of Independence: Independent with assistive device(s)        Comments: cane and furniture walking for mobility. independent with ADL. takes care of adult son who is dependent for transfers and ADL.        OT Problem List: Decreased strength;Decreased activity tolerance;Impaired balance (sitting and/or standing);Decreased knowledge of use of DME or AE;Decreased knowledge of precautions;Obesity;Pain      OT Treatment/Interventions:  Self-care/ADL training;Energy conservation;DME and/or AE instruction;Therapeutic activities;Patient/family education;Balance training    OT Goals(Current goals can be found in the care plan section) Acute Rehab OT Goals Patient Stated Goal: home today OT Goal Formulation: With patient Time For Goal Achievement: 09/22/16 Potential to Achieve Goals: Good ADL Goals Pt Will Perform Lower Body Bathing: with modified independence;sit to/from stand Pt Will Perform Lower Body Dressing: with modified independence;sit to/from stand Pt Will Transfer to Toilet: with modified independence;ambulating Pt Will Perform Toileting - Clothing Manipulation and hygiene: with modified independence;sit to/from stand Additional ADL Goal #1: Pt will perform bed mobility with supervision as precursor to ADL. Additional ADL Goal #2: Pt will don/doff brace with set up as precursor to ADL/mobility. Additional ADL Goal #3: Pt will independently verbally recall 3/3 back precautions and maintain throughout ADL.  OT Frequency: Min 2X/week   Barriers to D/C: Decreased caregiver support          Co-evaluation              AM-PAC PT "6 Clicks" Daily Activity     Outcome Measure Help from another person eating meals?: None Help from another person taking care of personal grooming?: A Little Help from another person toileting, which includes using toliet, bedpan, or urinal?: A Little Help from another person bathing (including washing, rinsing, drying)?: A Little Help from another person to put on and taking off regular upper body clothing?: A Little Help from another person  to put on and taking off regular lower body clothing?: A Little 6 Click Score: 19   End of Session Equipment Utilized During Treatment: Back brace;Other (comment) (cane) Nurse Communication: Mobility status;Other (comment) (no equipment or f/u needs)  Activity Tolerance: Patient tolerated treatment well Patient left: in chair;with call  bell/phone within reach  OT Visit Diagnosis: Unsteadiness on feet (R26.81);Other abnormalities of gait and mobility (R26.89);History of falling (Z91.81);Pain Pain - part of body:  (back)                Time: 6063-0160 OT Time Calculation (min): 23 min Charges:  OT General Charges $OT Visit: 1 Procedure OT Evaluation $OT Eval Moderate Complexity: 1 Procedure OT Treatments $Self Care/Home Management : 8-22 mins G-Codes:     Mel Almond A. Ulice Brilliant, M.S., OTR/L Pager: [place] 09/08/2016, 9:50 AM

## 2019-05-13 IMAGING — RF DG LUMBAR SPINE 2-3V
1 series · 3 of 3 positions shown · non-contrast
Comparison: MRI of the lumbar spine July 23, 2016

CLINICAL DATA: Intraoperative radiographs of the lumbar spine for
PLIF at L4-5. Fluoro time reported is 1 minutes and 15 seconds.

EXAM:
DG C-ARM 61-120 MIN; LUMBAR SPINE - 2-3 VIEW

[Series 1: run · 3 of 3 slices shown]
[im 1/3]
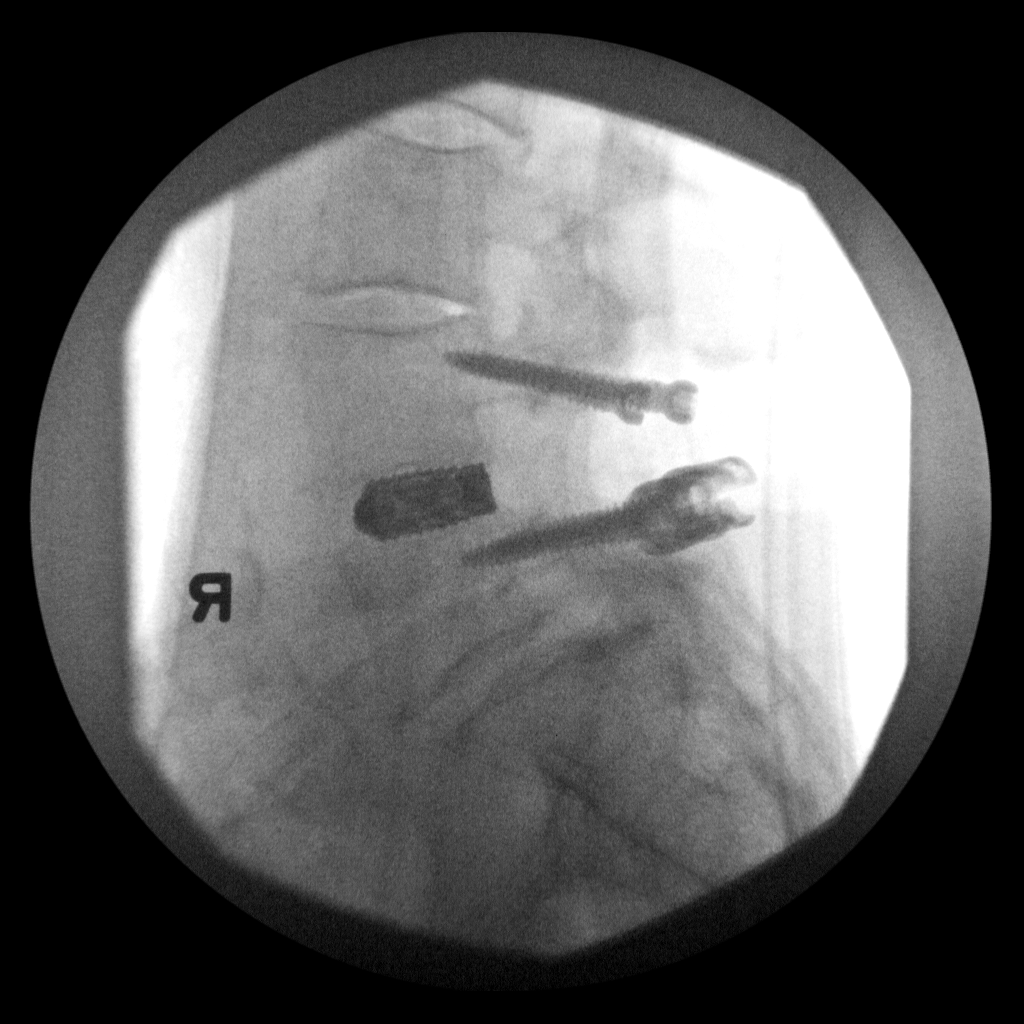
[im 2/3]
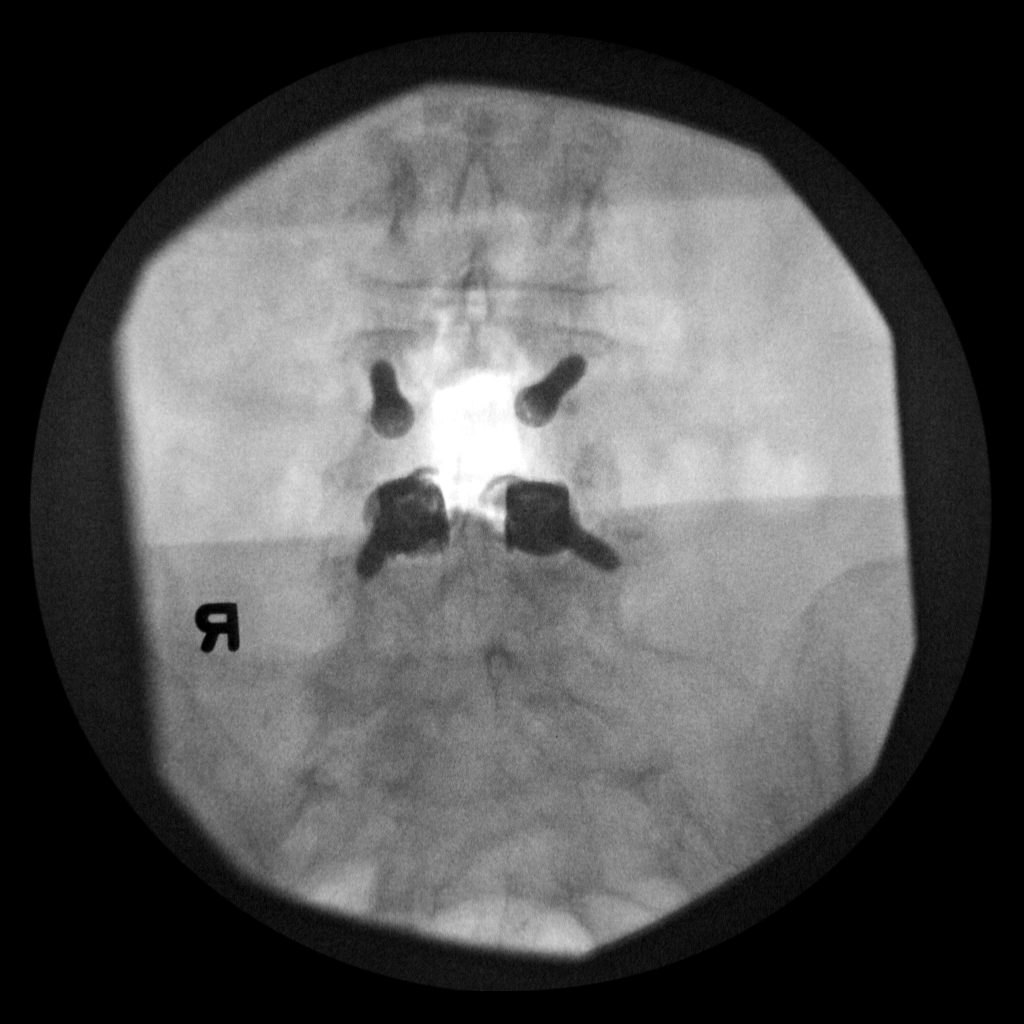
[im 3/3]
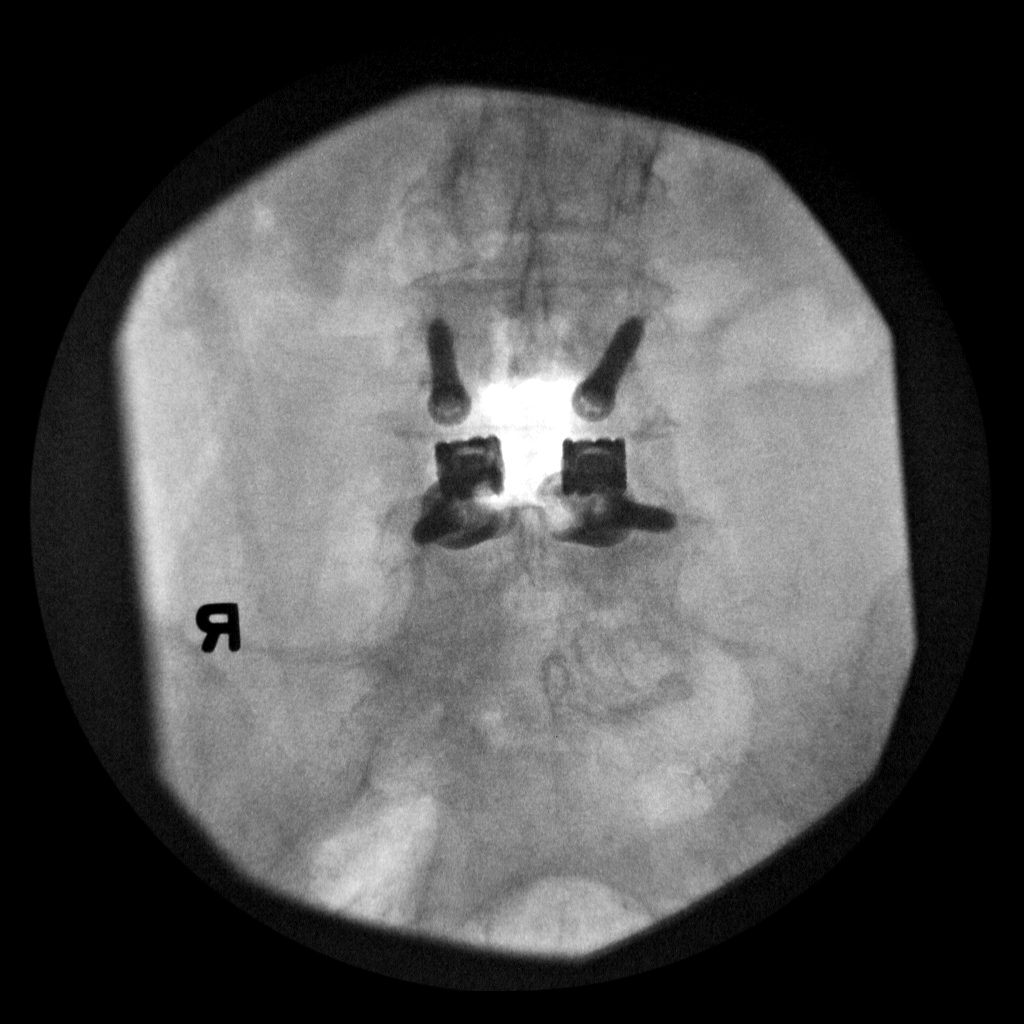

[3 of 3 positions shown; findings below may reference images not displayed]

FINDINGS: 3 fluoro spot images are reviewed. The images reveal placement of
pedicle screws at L4 and L5 as well as an intradiscal device. The
connecting rods between the pedicle screws are not yet placed.
IMPRESSION: Intraoperative radiographs revealing ongoing PLIF at L4-5. No
immediate complication is observed.

## 2022-08-05 ENCOUNTER — Ambulatory Visit: Payer: Medicare HMO | Admitting: Nurse Practitioner

## 2022-08-05 ENCOUNTER — Encounter: Payer: Self-pay | Admitting: Nurse Practitioner

## 2022-08-05 ENCOUNTER — Ambulatory Visit: Payer: Medicare Other | Admitting: Nurse Practitioner

## 2022-08-05 VITALS — BP 118/74 | HR 80 | Temp 97.8°F | Resp 16 | Ht 58.75 in | Wt 187.5 lb

## 2022-08-05 DIAGNOSIS — M545 Low back pain, unspecified: Secondary | ICD-10-CM

## 2022-08-05 DIAGNOSIS — E118 Type 2 diabetes mellitus with unspecified complications: Secondary | ICD-10-CM | POA: Insufficient documentation

## 2022-08-05 DIAGNOSIS — Z1231 Encounter for screening mammogram for malignant neoplasm of breast: Secondary | ICD-10-CM

## 2022-08-05 DIAGNOSIS — N3281 Overactive bladder: Secondary | ICD-10-CM | POA: Insufficient documentation

## 2022-08-05 DIAGNOSIS — R5383 Other fatigue: Secondary | ICD-10-CM | POA: Insufficient documentation

## 2022-08-05 DIAGNOSIS — G8929 Other chronic pain: Secondary | ICD-10-CM

## 2022-08-05 DIAGNOSIS — K5909 Other constipation: Secondary | ICD-10-CM | POA: Diagnosis not present

## 2022-08-05 DIAGNOSIS — Z7984 Long term (current) use of oral hypoglycemic drugs: Secondary | ICD-10-CM

## 2022-08-05 DIAGNOSIS — E039 Hypothyroidism, unspecified: Secondary | ICD-10-CM | POA: Diagnosis not present

## 2022-08-05 LAB — CBC
HCT: 44.9 % (ref 35.0–45.0)
MCH: 29.8 pg (ref 27.0–33.0)
MCHC: 32.7 g/dL (ref 32.0–36.0)
MCV: 91.1 fL (ref 80.0–100.0)
MPV: 9.8 fL (ref 7.5–12.5)
Platelets: 245 10*3/uL (ref 140–400)
WBC: 10.6 10*3/uL (ref 3.8–10.8)

## 2022-08-05 MED ORDER — BACLOFEN 20 MG PO TABS: 10.0000 mg | ORAL_TABLET | Freq: Every day | ORAL | 2 refills | Status: DC | PRN

## 2022-08-05 MED ORDER — METFORMIN HCL 500 MG PO TABS: 500.0000 mg | ORAL_TABLET | Freq: Every day | ORAL | 1 refills | Status: DC

## 2022-08-05 MED ORDER — PREDNISONE 10 MG PO TABS: 10.0000 mg | ORAL_TABLET | Freq: Every day | ORAL | 0 refills | Status: DC

## 2022-08-05 MED ORDER — LINACLOTIDE 72 MCG PO CAPS: 72.0000 ug | ORAL_CAPSULE | Freq: Every day | ORAL | 1 refills | Status: DC

## 2022-08-05 NOTE — Assessment & Plan Note (Signed)
Patinet currenlty maintained on levothyroxine daily, continue. Pending tsh today

## 2022-08-05 NOTE — Assessment & Plan Note (Signed)
History of the same.  Patient has been maintained on linzess in the past.  Will start back on is a 72 mcg daily.

## 2022-08-05 NOTE — Patient Instructions (Signed)
Nice to see you today I will be in touch with the labs once I have reviewed them  Follow up with me in 3 months, sooner if you need me 

## 2022-08-05 NOTE — Assessment & Plan Note (Signed)
Patient has tried oxybutynin in the past at 5mg  TID. She could not tolerate and self discontinued

## 2022-08-05 NOTE — Assessment & Plan Note (Signed)
Patient is currently maintained on metformin 500mg . Pending A1C. Wonder if secondary hyperglycemia due to chronic prednisone use

## 2022-08-05 NOTE — Assessment & Plan Note (Signed)
Hx of the same with surgery in the past. Continue the baclofen 5 mg QD PRN. Will continue 10mg  prednisone for now until labs result. Consider weaning her off

## 2022-08-05 NOTE — Progress Notes (Signed)
New Patient Office Visit  Subjective    Patient ID: Stacy Kirk, female    DOB: 1946-01-01  Age: 77 y.o. MRN: [id number]  CC:  Chief Complaint  Patient presents with   Establish Care    HPI Stacy Kirk presents to establish care   DM2: States that she does not check sug ar at home. States that she does not have the supplies States that she does tolerate it well   Constipation: states that she is on linzess daily. State that they will move twice a week. Sometime 3 times on a good week. States that they are rock hard pellets. Patient states that she has not had Linzess in some time but it did not work in the past.  Will decrease the patient's dose and start her on a lower dose given her age.  Hypothyroidism: takes that in the morning and tolerates it well.   HTN: states that she does not check blood pressure at home and currenty maintained on diet  Lower back pain: hx of back operation by Dr. Wynetta Emery. Patient currently on baclofen 5mg  daily prn. States that she is on prednisone 10mg  TID but only take 10mg  daily. Unsure as to why. She has not been dx with PMR per her report  Colonoscopy: aged out does not want any more Mammogram: need order for Stacy Kirk  Pap smear : aged out and hysterectomy     Outpatient Encounter Medications as of 08/05/2022  Medication Sig   acetaminophen (TYLENOL) 500 MG tablet Take 1,000 mg by mouth every 6 (six) hours as needed (FOR PAIN.).   docusate sodium (COLACE) 100 MG capsule Take 100 mg by mouth daily at 8 pm.   Hydrocortisone (CORTIZONE-10 EX) Apply 1 application topically daily as needed (for itchy skin).   ibuprofen (ADVIL,MOTRIN) 200 MG tablet Take 400 mg by mouth every 8 (eight) hours as needed (FOR PAIN).   levothyroxine (SYNTHROID, LEVOTHROID) 100 MCG tablet Take 100 mcg by mouth daily before breakfast.    linaclotide (LINZESS) 72 MCG capsule Take 1 capsule (72 mcg total) by mouth daily before breakfast.   magnesium  gluconate (MAGONATE) 500 MG tablet Take 500 mg by mouth daily.   Menthol, Topical Analgesic, (BENGAY EX) Apply 1 application topically 4 (four) times daily as needed (for back pain).   Menthol-Methyl Salicylate (BENGAY GREASELESS EX) Apply topically.   Multiple Vitamin (MULTIVITAMIN WITH MINERALS) TABS tablet Take 1 tablet by mouth daily. ESSENTIAL ONE SUPPLEMENT   Multiple Vitamins-Minerals (HAIR/SKIN/NAILS PO) Take 1 capsule by mouth daily. HEALTHY HAIR,SKIN & NAILS   [DISCONTINUED] baclofen (LIORESAL) 20 MG tablet Take 10 mg by mouth daily as needed for muscle spasms.   [DISCONTINUED] LINZESS 145 MCG CAPS capsule Take 145 mcg by mouth daily as needed. For constipation (typically twice a week); if not relieved by linzess will eat prunes or drink prune juice   [DISCONTINUED] metFORMIN (GLUCOPHAGE) 500 MG tablet Take 500 mg by mouth daily after supper.   [DISCONTINUED] predniSONE (DELTASONE) 10 MG tablet Take 10 mg by mouth 3 (three) times daily.   aspirin EC 81 MG tablet Take 81 mg by mouth every evening. (Patient not taking: Reported on 08/05/2022)   baclofen (LIORESAL) 20 MG tablet Take 0.5 tablets (10 mg total) by mouth daily as needed for muscle spasms.   hydrochlorothiazide (HYDRODIURIL) 25 MG tablet Take 25 mg by mouth daily. (Patient not taking: Reported on 08/05/2022)   metFORMIN (GLUCOPHAGE) 500 MG tablet Take 1 tablet (500 mg total)  by mouth daily after supper.   metoprolol succinate (TOPROL-XL) 25 MG 24 hr tablet Take 25 mg by mouth every evening. (Patient not taking: Reported on 08/05/2022)   predniSONE (DELTASONE) 10 MG tablet Take 1 tablet (10 mg total) by mouth daily with breakfast.   Propylene Glycol ([employer] LUBRICANT DROPS) 0.6 % SOLN Place 1 drop into both eyes 3 (three) times daily as needed (for dry eyes). (Patient not taking: Reported on 08/05/2022)   No facility-administered encounter medications on file as of 08/05/2022.    Past Medical History:  Diagnosis Date   Arthritis     Cancer (HCC)    ovarian, skin face- skin cancer   Complication of anesthesia    slow to awaken everday, even slower with anesthesia   Constipation    Diabetes mellitus    Type II   GERD (gastroesophageal reflux disease)    Headache 05/12/2015   "after a fall" headache for 8 months   Hemorrhoids    Hernia    History of kidney stones    passed   Hypertension    Hypothyroidism    Loss of appetite    Muscle spasm    back   Osteoporosis    Redness    abd wound   Urine frequency    unable to hold urine    Wears hearing aid    bilateral     Past Surgical History:  Procedure Laterality Date   ABDOMINAL HYSTERECTOMY     APPENDECTOMY     CHOLECYSTECTOMY     COLONOSCOPY W/ POLYPECTOMY     HERNIA REPAIR  Jun2012   open component separation w biologic mesh (Dr. Luisa Hart)   TOE SURGERY Left    2nd toe   TONSILLECTOMY      Family History  Problem Relation Age of Onset   Diabetes Mother    Early death Sister    Drug abuse Sister     Social History   Socioeconomic History   Marital status: Married    Spouse name: Not on file   Number of children: 2   Years of education: Not on file   Highest education level: Not on file  Occupational History   Not on file  Tobacco Use   Smoking status: Former    Years: 15    Types: Cigarettes    Quit date: 03/28/1998    Years since quitting: 24.3   Smokeless tobacco: Never   Tobacco comments:    quit 2002  Vaping Use   Vaping Use: Never used  Substance and Sexual Activity   Alcohol use: No   Drug use: No   Sexual activity: Not on file  Other Topics Concern   Not on file  Social History Narrative   Retired      International aid/development worker of [employer] Strain: Not on file  Food Insecurity: Not on file  Transportation Needs: Not on file  Physical Activity: Not on file  Stress: Not on file  Social Connections: Not on file  Intimate Partner Violence: Not on file    Review of Systems  Constitutional:   Negative for chills and fever.  Respiratory:  Negative for shortness of breath.   Cardiovascular:  Negative for chest pain.  Gastrointestinal:  Positive for constipation. Negative for abdominal pain, diarrhea, nausea and vomiting.  Genitourinary:  Negative for dysuria and hematuria.  Musculoskeletal:  Positive for back pain.  Neurological:  Negative for tingling and headaches.  Psychiatric/Behavioral:  Negative for hallucinations  and suicidal ideas.         Objective    BP 118/74   Pulse 80   Temp 97.8 F (36.6 C)   Resp 16   Ht 4' 10.75" (1.492 m)   Wt 187 lb 8 oz (85 kg)   SpO2 98%   BMI 38.19 kg/m   Physical Exam Vitals and nursing note reviewed.  Constitutional:      Appearance: Normal appearance.  Cardiovascular:     Rate and Rhythm: Normal rate and regular rhythm.     Heart sounds: Normal heart sounds.  Pulmonary:     Effort: Pulmonary effort is normal.     Breath sounds: Normal breath sounds.  Abdominal:     General: Bowel sounds are normal.  Musculoskeletal:     Right lower leg: No edema.     Left lower leg: No edema.  Neurological:     Mental Status: She is alert.    Diabetic Foot Form - Detailed   Diabetic Foot Exam - detailed Diabetic Foot exam was performed with the following findings: Yes 08/05/2022  2:58 PM  Is there swelling or and abnormal foot shape?: No Is there a claw toe deformity?: No Is there elevated skin temparature?: No Pulse Foot Exam completed.: Yes   Right posterior Tibialias: Present Left posterior Tibialias: Present   Right Dorsalis Pedis: Present Left Dorsalis Pedis: Present  Sensory Foot Exam Completed.: Yes Semmes-Weinstein Monofilament Test   Comments: All 10 sites bilaterally intact  Callus to the right second toe on the medial side          Assessment & Plan:   Problem List Items Addressed This Visit       Digestive   Chronic constipation    History of the same.  Patient has been maintained on linzess in the  past.  Will start back on is a 72 mcg daily.      Relevant Medications   linaclotide (LINZESS) 72 MCG capsule     Endocrine   Controlled type 2 diabetes mellitus with complication, without long-term current use of insulin (HCC) - Primary    Patient is currently maintained on metformin 500mg . Pending A1C. Wonder if secondary hyperglycemia due to chronic prednisone use      Relevant Medications   metFORMIN (GLUCOPHAGE) 500 MG tablet   Other Relevant Orders   CBC   Comprehensive metabolic panel   Hemoglobin A1c   Microalbumin / creatinine urine ratio   Hypothyroidism    Patinet currenlty maintained on levothyroxine daily, continue. Pending tsh today      Relevant Orders   TSH     Genitourinary   OAB (overactive bladder)    Patient has tried oxybutynin in the past at 5mg  TID. She could not tolerate and self discontinued        Other   Low back pain    Hx of the same with surgery in the past. Continue the baclofen 5 mg QD PRN. Will continue 10mg  prednisone for now until labs result. Consider weaning her off      Relevant Medications   baclofen (LIORESAL) 20 MG tablet   predniSONE (DELTASONE) 10 MG tablet   Fatigue    Patient does not get restorative sleep. Also mentioned short term memory issues. Will check TSH, B12 and vitamin d level, pending results      Relevant Orders   Vitamin B12   VITAMIN D 25 Hydroxy (Vit-D Deficiency, Fractures)   Other Visit Diagnoses  Screening mammogram for breast cancer       Relevant Orders   MM 3D SCREENING MAMMOGRAM BILATERAL BREAST       Return in about 3 months (around 11/05/2022) for DM recheck.   Audria Nine, NP

## 2022-08-05 NOTE — Assessment & Plan Note (Signed)
Patient does not get restorative sleep. Also mentioned short term memory issues. Will check TSH, B12 and vitamin d level, pending results

## 2022-08-06 LAB — COMPREHENSIVE METABOLIC PANEL
AG Ratio: 1.3 (calc) (ref 1.0–2.5)
ALT: 12 U/L (ref 6–29)
AST: 14 U/L (ref 10–35)
Albumin: 3.7 g/dL (ref 3.6–5.1)
Alkaline phosphatase (APISO): 49 U/L (ref 37–153)
BUN/Creatinine Ratio: 18 (calc) (ref 6–22)
BUN: 22 mg/dL (ref 7–25)
CO2: 31 mmol/L (ref 20–32)
Calcium: 9.4 mg/dL (ref 8.6–10.4)
Chloride: 98 mmol/L (ref 98–110)
Creat: 1.19 mg/dL — ABNORMAL HIGH (ref 0.60–1.00)
Globulin: 2.8 g/dL (calc) (ref 1.9–3.7)
Glucose, Bld: 56 mg/dL — ABNORMAL LOW (ref 65–99)
Potassium: 4 mmol/L (ref 3.5–5.3)
Sodium: 140 mmol/L (ref 135–146)
Total Bilirubin: 0.7 mg/dL (ref 0.2–1.2)
Total Protein: 6.5 g/dL (ref 6.1–8.1)

## 2022-08-06 LAB — HEMOGLOBIN A1C
Hgb A1c MFr Bld: 6.6 % of total Hgb — ABNORMAL HIGH (ref <5.7)
Mean Plasma Glucose: 143 mg/dL
eAG (mmol/L): 7.9 mmol/L

## 2022-08-06 LAB — CBC
Hemoglobin: 14.7 g/dL (ref 11.7–15.5)
RBC: 4.93 10*6/uL (ref 3.80–5.10)
RDW: 15.4 % — ABNORMAL HIGH (ref 11.0–15.0)

## 2022-08-06 LAB — VITAMIN B12: Vitamin B-12: 494 pg/mL (ref 200–1100)

## 2022-08-06 LAB — MICROALBUMIN / CREATININE URINE RATIO
Creatinine, Urine: 236 mg/dL (ref 20–275)
Microalb Creat Ratio: 7 mg/g creat (ref <30)
Microalb, Ur: 1.6 mg/dL

## 2022-08-06 LAB — TSH: TSH: 10.22 mIU/L — ABNORMAL HIGH (ref 0.40–4.50)

## 2022-08-06 LAB — VITAMIN D 25 HYDROXY (VIT D DEFICIENCY, FRACTURES): Vit D, 25-Hydroxy: 54 ng/mL (ref 30–100)

## 2022-08-09 ENCOUNTER — Telehealth: Payer: Self-pay | Admitting: Nurse Practitioner

## 2022-08-09 NOTE — Telephone Encounter (Signed)
-----   Message from Vertis Kelch, [place] sent at 08/08/2022  3:47 PM EDT ----- Called patient reviewed all information and repeated back to me. Will call if any questions.  Pt states that she takes her thyroid medication in the am when she first gets up, and does not eat or drink anything 30 mins after the takes her medication. All other medication she takes at night before bed.

## 2022-08-09 NOTE — Telephone Encounter (Signed)
Noted we can recheck her tyroid functions again at her next office visit

## 2022-08-10 ENCOUNTER — Telehealth: Payer: Self-pay | Admitting: Nurse Practitioner

## 2022-08-10 DIAGNOSIS — E039 Hypothyroidism, unspecified: Secondary | ICD-10-CM

## 2022-08-10 NOTE — Telephone Encounter (Signed)
Patient husband is requesting a phone call to discuss wife's lab results again.He stated that she doesn't remember what was told to her regarding her results when she spoke to [place].

## 2022-08-15 NOTE — Telephone Encounter (Signed)
Called pt husband and he wanted to know what to do about her thyroid being high? He said what is the next step? Also wanted to know if we were able to add on the additional test that you wanted to add on. He stated that the pt is always tired and wanted to know if that could be because her thyroid is high.

## 2022-08-15 NOTE — Telephone Encounter (Signed)
I was unable to add on the T3 T4. She needs the labs repeated with the add on tests. This can wait until her next office visit or she can get the repeat lab done in the next week or so

## 2022-08-16 NOTE — Telephone Encounter (Signed)
Spoke to patient she would like to have labs checked. She is going to have husband call and set up lab app. Did not see any orders in .

## 2022-08-17 NOTE — Telephone Encounter (Signed)
Orders placed.

## 2022-08-17 NOTE — Addendum Note (Signed)
Addended by: Eden Emms on: 08/17/2022 01:11 PM   Modules accepted: Orders

## 2022-08-19 ENCOUNTER — Other Ambulatory Visit (INDEPENDENT_AMBULATORY_CARE_PROVIDER_SITE_OTHER): Payer: Medicare HMO

## 2022-08-19 DIAGNOSIS — E039 Hypothyroidism, unspecified: Secondary | ICD-10-CM

## 2022-08-19 NOTE — Addendum Note (Signed)
Addended by: Alvina Chou on: 08/19/2022 03:28 PM   Modules accepted: Orders

## 2022-08-20 LAB — TSH: TSH: 2.97 mIU/L (ref 0.40–4.50)

## 2022-08-20 LAB — T4, FREE: Free T4: 1.4 ng/dL (ref 0.8–1.8)

## 2022-08-20 LAB — T3, FREE: T3, Free: 2.3 pg/mL (ref 2.3–4.2)

## 2022-08-24 ENCOUNTER — Telehealth: Payer: Self-pay | Admitting: Nurse Practitioner

## 2022-08-24 NOTE — Telephone Encounter (Signed)
Patient's husband contacted the office regarding patient's recent lab results, says patient did a thyroid recheck as ordered by [facility]. Patient's husband requested a call whenever possible to discuss results and next steps for the patient. Please advise, thank you.

## 2022-08-24 NOTE — Telephone Encounter (Signed)
Called and relayed the lab results to the husband who is on the DRP

## 2022-09-30 ENCOUNTER — Emergency Department (HOSPITAL_COMMUNITY): Payer: Medicare HMO

## 2022-09-30 ENCOUNTER — Other Ambulatory Visit: Payer: Self-pay

## 2022-09-30 ENCOUNTER — Inpatient Hospital Stay (HOSPITAL_COMMUNITY)
Admission: EM | Admit: 2022-09-30 | Discharge: 2022-10-10 | DRG: 551 | Disposition: A | Discharge status: Skilled Nursing Facility | Payer: Medicare HMO | Attending: Internal Medicine | Admitting: Internal Medicine

## 2022-09-30 ENCOUNTER — Encounter (HOSPITAL_COMMUNITY): Payer: Self-pay

## 2022-09-30 DIAGNOSIS — I350 Nonrheumatic aortic (valve) stenosis: Secondary | ICD-10-CM | POA: Diagnosis not present

## 2022-09-30 DIAGNOSIS — J398 Other specified diseases of upper respiratory tract: Secondary | ICD-10-CM | POA: Diagnosis not present

## 2022-09-30 DIAGNOSIS — S32020A Wedge compression fracture of second lumbar vertebra, initial encounter for closed fracture: Secondary | ICD-10-CM | POA: Diagnosis not present

## 2022-09-30 DIAGNOSIS — Z79890 Hormone replacement therapy (postmenopausal): Secondary | ICD-10-CM

## 2022-09-30 DIAGNOSIS — Z981 Arthrodesis status: Secondary | ICD-10-CM

## 2022-09-30 DIAGNOSIS — M5136 Other intervertebral disc degeneration, lumbar region: Secondary | ICD-10-CM | POA: Diagnosis not present

## 2022-09-30 DIAGNOSIS — E118 Type 2 diabetes mellitus with unspecified complications: Secondary | ICD-10-CM | POA: Diagnosis present

## 2022-09-30 DIAGNOSIS — I491 Atrial premature depolarization: Secondary | ICD-10-CM | POA: Diagnosis not present

## 2022-09-30 DIAGNOSIS — R4182 Altered mental status, unspecified: Secondary | ICD-10-CM | POA: Diagnosis not present

## 2022-09-30 DIAGNOSIS — M25551 Pain in right hip: Secondary | ICD-10-CM | POA: Diagnosis not present

## 2022-09-30 DIAGNOSIS — Z881 Allergy status to other antibiotic agents status: Secondary | ICD-10-CM

## 2022-09-30 DIAGNOSIS — Z885 Allergy status to narcotic agent status: Secondary | ICD-10-CM

## 2022-09-30 DIAGNOSIS — Z85828 Personal history of other malignant neoplasm of skin: Secondary | ICD-10-CM | POA: Diagnosis not present

## 2022-09-30 DIAGNOSIS — R2689 Other abnormalities of gait and mobility: Secondary | ICD-10-CM | POA: Diagnosis not present

## 2022-09-30 DIAGNOSIS — Z6836 Body mass index (BMI) 36.0-36.9, adult: Secondary | ICD-10-CM | POA: Diagnosis not present

## 2022-09-30 DIAGNOSIS — I2699 Other pulmonary embolism without acute cor pulmonale: Secondary | ICD-10-CM | POA: Diagnosis present

## 2022-09-30 DIAGNOSIS — S32029A Unspecified fracture of second lumbar vertebra, initial encounter for closed fracture: Principal | ICD-10-CM | POA: Diagnosis present

## 2022-09-30 DIAGNOSIS — M47816 Spondylosis without myelopathy or radiculopathy, lumbar region: Secondary | ICD-10-CM | POA: Diagnosis not present

## 2022-09-30 DIAGNOSIS — I82461 Acute embolism and thrombosis of right calf muscular vein: Secondary | ICD-10-CM | POA: Diagnosis present

## 2022-09-30 DIAGNOSIS — S3992XA Unspecified injury of lower back, initial encounter: Secondary | ICD-10-CM | POA: Diagnosis not present

## 2022-09-30 DIAGNOSIS — I5022 Chronic systolic (congestive) heart failure: Secondary | ICD-10-CM | POA: Diagnosis not present

## 2022-09-30 DIAGNOSIS — Y92009 Unspecified place in unspecified non-institutional (private) residence as the place of occurrence of the external cause: Secondary | ICD-10-CM

## 2022-09-30 DIAGNOSIS — S32009A Unspecified fracture of unspecified lumbar vertebra, initial encounter for closed fracture: Secondary | ICD-10-CM | POA: Diagnosis not present

## 2022-09-30 DIAGNOSIS — M7989 Other specified soft tissue disorders: Secondary | ICD-10-CM | POA: Diagnosis not present

## 2022-09-30 DIAGNOSIS — R109 Unspecified abdominal pain: Secondary | ICD-10-CM | POA: Diagnosis not present

## 2022-09-30 DIAGNOSIS — R000 Tachycardia, unspecified: Secondary | ICD-10-CM | POA: Diagnosis not present

## 2022-09-30 DIAGNOSIS — Z7952 Long term (current) use of systemic steroids: Secondary | ICD-10-CM

## 2022-09-30 DIAGNOSIS — E039 Hypothyroidism, unspecified: Secondary | ICD-10-CM | POA: Diagnosis present

## 2022-09-30 DIAGNOSIS — R278 Other lack of coordination: Secondary | ICD-10-CM | POA: Diagnosis not present

## 2022-09-30 DIAGNOSIS — Z96651 Presence of right artificial knee joint: Secondary | ICD-10-CM | POA: Diagnosis not present

## 2022-09-30 DIAGNOSIS — Z79899 Other long term (current) drug therapy: Secondary | ICD-10-CM

## 2022-09-30 DIAGNOSIS — E876 Hypokalemia: Secondary | ICD-10-CM | POA: Diagnosis present

## 2022-09-30 DIAGNOSIS — S32010A Wedge compression fracture of first lumbar vertebra, initial encounter for closed fracture: Secondary | ICD-10-CM | POA: Diagnosis not present

## 2022-09-30 DIAGNOSIS — N390 Urinary tract infection, site not specified: Secondary | ICD-10-CM | POA: Diagnosis not present

## 2022-09-30 DIAGNOSIS — I700 Atherosclerosis of aorta: Secondary | ICD-10-CM | POA: Diagnosis not present

## 2022-09-30 DIAGNOSIS — M160 Bilateral primary osteoarthritis of hip: Secondary | ICD-10-CM | POA: Diagnosis not present

## 2022-09-30 DIAGNOSIS — W010XXA Fall on same level from slipping, tripping and stumbling without subsequent striking against object, initial encounter: Secondary | ICD-10-CM | POA: Diagnosis present

## 2022-09-30 DIAGNOSIS — K219 Gastro-esophageal reflux disease without esophagitis: Secondary | ICD-10-CM | POA: Diagnosis present

## 2022-09-30 DIAGNOSIS — Z043 Encounter for examination and observation following other accident: Secondary | ICD-10-CM | POA: Diagnosis not present

## 2022-09-30 DIAGNOSIS — M545 Low back pain, unspecified: Secondary | ICD-10-CM | POA: Diagnosis not present

## 2022-09-30 DIAGNOSIS — S32000A Wedge compression fracture of unspecified lumbar vertebra, initial encounter for closed fracture: Secondary | ICD-10-CM | POA: Diagnosis present

## 2022-09-30 DIAGNOSIS — R1084 Generalized abdominal pain: Secondary | ICD-10-CM | POA: Diagnosis not present

## 2022-09-30 DIAGNOSIS — I2693 Single subsegmental pulmonary embolism without acute cor pulmonale: Secondary | ICD-10-CM | POA: Diagnosis not present

## 2022-09-30 DIAGNOSIS — I10 Essential (primary) hypertension: Secondary | ICD-10-CM | POA: Diagnosis not present

## 2022-09-30 DIAGNOSIS — Z7982 Long term (current) use of aspirin: Secondary | ICD-10-CM

## 2022-09-30 DIAGNOSIS — M1611 Unilateral primary osteoarthritis, right hip: Secondary | ICD-10-CM | POA: Diagnosis not present

## 2022-09-30 DIAGNOSIS — Z743 Need for continuous supervision: Secondary | ICD-10-CM | POA: Diagnosis not present

## 2022-09-30 DIAGNOSIS — M810 Age-related osteoporosis without current pathological fracture: Secondary | ICD-10-CM | POA: Diagnosis present

## 2022-09-30 DIAGNOSIS — M4317 Spondylolisthesis, lumbosacral region: Secondary | ICD-10-CM | POA: Diagnosis not present

## 2022-09-30 DIAGNOSIS — Z833 Family history of diabetes mellitus: Secondary | ICD-10-CM

## 2022-09-30 DIAGNOSIS — M6281 Muscle weakness (generalized): Secondary | ICD-10-CM | POA: Diagnosis not present

## 2022-09-30 DIAGNOSIS — K5900 Constipation, unspecified: Secondary | ICD-10-CM | POA: Diagnosis present

## 2022-09-30 DIAGNOSIS — Z7401 Bed confinement status: Secondary | ICD-10-CM

## 2022-09-30 DIAGNOSIS — R112 Nausea with vomiting, unspecified: Secondary | ICD-10-CM | POA: Diagnosis not present

## 2022-09-30 DIAGNOSIS — Z888 Allergy status to other drugs, medicaments and biological substances status: Secondary | ICD-10-CM

## 2022-09-30 DIAGNOSIS — Z886 Allergy status to analgesic agent status: Secondary | ICD-10-CM

## 2022-09-30 DIAGNOSIS — Z87891 Personal history of nicotine dependence: Secondary | ICD-10-CM | POA: Diagnosis not present

## 2022-09-30 DIAGNOSIS — I110 Hypertensive heart disease with heart failure: Secondary | ICD-10-CM | POA: Diagnosis not present

## 2022-09-30 DIAGNOSIS — E860 Dehydration: Secondary | ICD-10-CM | POA: Diagnosis not present

## 2022-09-30 DIAGNOSIS — I517 Cardiomegaly: Secondary | ICD-10-CM | POA: Diagnosis not present

## 2022-09-30 DIAGNOSIS — M7732 Calcaneal spur, left foot: Secondary | ICD-10-CM | POA: Diagnosis not present

## 2022-09-30 DIAGNOSIS — S79911A Unspecified injury of right hip, initial encounter: Secondary | ICD-10-CM | POA: Diagnosis not present

## 2022-09-30 DIAGNOSIS — K5730 Diverticulosis of large intestine without perforation or abscess without bleeding: Secondary | ICD-10-CM | POA: Diagnosis not present

## 2022-09-30 DIAGNOSIS — E1165 Type 2 diabetes mellitus with hyperglycemia: Secondary | ICD-10-CM | POA: Diagnosis not present

## 2022-09-30 DIAGNOSIS — Z813 Family history of other psychoactive substance abuse and dependence: Secondary | ICD-10-CM

## 2022-09-30 DIAGNOSIS — M25561 Pain in right knee: Secondary | ICD-10-CM | POA: Diagnosis not present

## 2022-09-30 DIAGNOSIS — R0989 Other specified symptoms and signs involving the circulatory and respiratory systems: Secondary | ICD-10-CM | POA: Diagnosis not present

## 2022-09-30 DIAGNOSIS — M4186 Other forms of scoliosis, lumbar region: Secondary | ICD-10-CM | POA: Diagnosis not present

## 2022-09-30 DIAGNOSIS — Z7984 Long term (current) use of oral hypoglycemic drugs: Secondary | ICD-10-CM

## 2022-09-30 DIAGNOSIS — K449 Diaphragmatic hernia without obstruction or gangrene: Secondary | ICD-10-CM | POA: Diagnosis not present

## 2022-09-30 DIAGNOSIS — R9431 Abnormal electrocardiogram [ECG] [EKG]: Secondary | ICD-10-CM | POA: Diagnosis not present

## 2022-09-30 DIAGNOSIS — M25572 Pain in left ankle and joints of left foot: Secondary | ICD-10-CM | POA: Diagnosis not present

## 2022-09-30 DIAGNOSIS — S32000D Wedge compression fracture of unspecified lumbar vertebra, subsequent encounter for fracture with routine healing: Secondary | ICD-10-CM | POA: Diagnosis not present

## 2022-09-30 LAB — CBC
HCT: 45.3 % (ref 36.0–46.0)
Hemoglobin: 14.8 g/dL (ref 12.0–15.0)
MCH: 30.3 pg (ref 26.0–34.0)
MCHC: 32.7 g/dL (ref 30.0–36.0)
MCV: 92.6 fL (ref 80.0–100.0)
Platelets: 233 10*3/uL (ref 150–400)
RBC: 4.89 MIL/uL (ref 3.87–5.11)
RDW: 13.5 % (ref 11.5–15.5)
WBC: 13.3 10*3/uL — ABNORMAL HIGH (ref 4.0–10.5)
nRBC: 0 % (ref 0.0–0.2)

## 2022-09-30 LAB — TYPE AND SCREEN
ABO/RH(D): B POS
Antibody Screen: NEGATIVE

## 2022-09-30 LAB — COMPREHENSIVE METABOLIC PANEL
ALT: 17 U/L (ref 0–44)
AST: 19 U/L (ref 15–41)
Albumin: 3.2 g/dL — ABNORMAL LOW (ref 3.5–5.0)
Alkaline Phosphatase: 44 U/L (ref 38–126)
Anion gap: 15 (ref 5–15)
BUN: 28 mg/dL — ABNORMAL HIGH (ref 8–23)
CO2: 26 mmol/L (ref 22–32)
Calcium: 8.2 mg/dL — ABNORMAL LOW (ref 8.9–10.3)
Chloride: 99 mmol/L (ref 98–111)
Creatinine, Ser: 1.13 mg/dL — ABNORMAL HIGH (ref 0.44–1.00)
GFR, Estimated: 49 mL/min — ABNORMAL LOW (ref >60)
Glucose, Bld: 143 mg/dL — ABNORMAL HIGH (ref 70–99)
Potassium: 3 mmol/L — ABNORMAL LOW (ref 3.5–5.1)
Sodium: 140 mmol/L (ref 135–145)
Total Bilirubin: 1.3 mg/dL — ABNORMAL HIGH (ref 0.3–1.2)
Total Protein: 5.7 g/dL — ABNORMAL LOW (ref 6.5–8.1)

## 2022-09-30 LAB — CBG MONITORING, ED: Glucose-Capillary: 110 mg/dL — ABNORMAL HIGH (ref 70–99)

## 2022-09-30 LAB — LIPASE, BLOOD: Lipase: 40 U/L (ref 11–51)

## 2022-09-30 LAB — MAGNESIUM: Magnesium: 1.2 mg/dL — ABNORMAL LOW (ref 1.7–2.4)

## 2022-09-30 MED ORDER — LEVOTHYROXINE SODIUM 100 MCG PO TABS
100.0000 ug | ORAL_TABLET | Freq: Every day | ORAL | Status: DC
  Administered 2022-10-01 – 2022-10-10 (×10): 100 ug via ORAL
  Filled 2022-09-30 (×10): qty 1

## 2022-09-30 MED ORDER — DILTIAZEM HCL ER COATED BEADS 120 MG PO CP24
120.0000 mg | ORAL_CAPSULE | Freq: Every day | ORAL | Status: DC
  Administered 2022-10-01 – 2022-10-02 (×2): 120 mg via ORAL
  Filled 2022-09-30 (×4): qty 1

## 2022-09-30 MED ORDER — INSULIN ASPART 100 UNIT/ML IJ SOLN
0.0000 [IU] | Freq: Three times a day (TID) | INTRAMUSCULAR | Status: DC
  Administered 2022-10-02: 3 [IU] via SUBCUTANEOUS
  Administered 2022-10-04 (×2): 2 [IU] via SUBCUTANEOUS
  Administered 2022-10-05: 5 [IU] via SUBCUTANEOUS
  Administered 2022-10-05: 3 [IU] via SUBCUTANEOUS
  Administered 2022-10-06: 2 [IU] via SUBCUTANEOUS
  Administered 2022-10-06 – 2022-10-08 (×3): 3 [IU] via SUBCUTANEOUS
  Administered 2022-10-08: 5 [IU] via SUBCUTANEOUS
  Administered 2022-10-09: 3 [IU] via SUBCUTANEOUS
  Administered 2022-10-09: 2 [IU] via SUBCUTANEOUS

## 2022-09-30 MED ORDER — KETOROLAC TROMETHAMINE 15 MG/ML IJ SOLN
15.0000 mg | Freq: Once | INTRAMUSCULAR | Status: AC
  Administered 2022-09-30: 15 mg via INTRAVENOUS
  Filled 2022-09-30: qty 1

## 2022-09-30 MED ORDER — ONDANSETRON HCL 4 MG PO TABS: 4.0000 mg | ORAL_TABLET | Freq: Four times a day (QID) | ORAL | Status: DC | PRN

## 2022-09-30 MED ORDER — ONDANSETRON HCL 4 MG/2ML IJ SOLN
4.0000 mg | Freq: Once | INTRAMUSCULAR | Status: AC
  Administered 2022-09-30: 4 mg via INTRAVENOUS
  Filled 2022-09-30: qty 2

## 2022-09-30 MED ORDER — SENNOSIDES-DOCUSATE SODIUM 8.6-50 MG PO TABS
1.0000 | ORAL_TABLET | Freq: Every evening | ORAL | Status: DC | PRN
  Administered 2022-10-01: 1 via ORAL
  Filled 2022-09-30: qty 1

## 2022-09-30 MED ORDER — METOCLOPRAMIDE HCL 5 MG/ML IJ SOLN
10.0000 mg | Freq: Once | INTRAMUSCULAR | Status: AC
  Administered 2022-09-30: 10 mg via INTRAVENOUS
  Filled 2022-09-30: qty 2

## 2022-09-30 MED ORDER — ONDANSETRON HCL 4 MG/2ML IJ SOLN
4.0000 mg | Freq: Four times a day (QID) | INTRAMUSCULAR | Status: DC | PRN
  Administered 2022-10-03 – 2022-10-10 (×2): 4 mg via INTRAVENOUS
  Filled 2022-09-30 (×2): qty 2

## 2022-09-30 MED ORDER — HYDROCODONE-ACETAMINOPHEN 5-325 MG PO TABS
1.0000 | ORAL_TABLET | ORAL | Status: DC | PRN
  Administered 2022-10-01: 2 via ORAL
  Administered 2022-10-01: 1 via ORAL
  Administered 2022-10-02: 2 via ORAL
  Administered 2022-10-02: 1 via ORAL
  Administered 2022-10-06 – 2022-10-08 (×3): 2 via ORAL
  Filled 2022-09-30: qty 1
  Filled 2022-09-30 (×4): qty 2
  Filled 2022-09-30 (×2): qty 1
  Filled 2022-09-30 (×2): qty 2
  Filled 2022-09-30: qty 1

## 2022-09-30 MED ORDER — POTASSIUM CHLORIDE 20 MEQ PO PACK
60.0000 meq | PACK | Freq: Once | ORAL | Status: AC
  Administered 2022-09-30: 60 meq via ORAL
  Filled 2022-09-30: qty 3

## 2022-09-30 MED ORDER — INSULIN ASPART 100 UNIT/ML IJ SOLN: 0.0000 [IU] | Freq: Every day | INTRAMUSCULAR | Status: DC

## 2022-09-30 MED ORDER — ACETAMINOPHEN 650 MG RE SUPP: 650.0000 mg | Freq: Four times a day (QID) | RECTAL | Status: DC | PRN

## 2022-09-30 MED ORDER — BISACODYL 5 MG PO TBEC
5.0000 mg | DELAYED_RELEASE_TABLET | Freq: Every day | ORAL | Status: DC | PRN
  Administered 2022-10-01: 5 mg via ORAL
  Filled 2022-09-30 (×2): qty 1

## 2022-09-30 MED ORDER — MORPHINE SULFATE (PF) 4 MG/ML IV SOLN
4.0000 mg | INTRAVENOUS | Status: AC | PRN
  Administered 2022-09-30 (×2): 4 mg via INTRAVENOUS
  Filled 2022-09-30 (×2): qty 1

## 2022-09-30 MED ORDER — ENOXAPARIN SODIUM 40 MG/0.4ML IJ SOSY
40.0000 mg | PREFILLED_SYRINGE | INTRAMUSCULAR | Status: DC
  Administered 2022-10-01 – 2022-10-02 (×2): 40 mg via SUBCUTANEOUS
  Filled 2022-09-30 (×2): qty 0.4

## 2022-09-30 MED ORDER — ACETAMINOPHEN 325 MG PO TABS
650.0000 mg | ORAL_TABLET | Freq: Four times a day (QID) | ORAL | Status: DC | PRN
  Administered 2022-10-04 – 2022-10-10 (×9): 650 mg via ORAL
  Filled 2022-09-30 (×9): qty 2

## 2022-09-30 MED ORDER — MORPHINE SULFATE (PF) 2 MG/ML IV SOLN
2.0000 mg | INTRAVENOUS | Status: DC | PRN
  Administered 2022-10-01: 2 mg via INTRAVENOUS
  Filled 2022-09-30: qty 1

## 2022-09-30 MED ORDER — SODIUM CHLORIDE 0.9 % IV SOLN: INTRAVENOUS | Status: DC

## 2022-09-30 NOTE — ED Notes (Signed)
Patient transported to CT 

## 2022-09-30 NOTE — ED Provider Notes (Signed)
[facility] Provider Note   CSN: [id number] Arrival date & time: 09/30/22  1543     History Chief Complaint  Patient presents with   Fall   Hip Pain   Emesis    HPI Stacy Kirk is a 77 y.o. female presenting after a fall this afternoon when her husband attempted to lift her from the couch. She tripped on her legs, cane was not at reach, hit the couch and fell on the ground. Not on anticoagulants, did not hit her head. Rates pain 10/10   Past Medical History:  Diagnosis Date   Arthritis    Cancer (HCC)    ovarian, skin face- skin cancer   Complication of anesthesia    slow to awaken everday, even slower with anesthesia   Constipation    Diabetes mellitus    Type II   GERD (gastroesophageal reflux disease)    Headache 05/12/2015   "after a fall" headache for 8 months   Hemorrhoids    Hernia    History of kidney stones    passed   Hypertension    Hypothyroidism    Loss of appetite    Muscle spasm    back   Osteoporosis    Redness    abd wound   Urine frequency    unable to hold urine    Wears hearing aid    bilateral     Patient's recorded medical, surgical, social, medication list and allergies were reviewed in the Snapshot window as part of the initial history.   Review of Systems   Review of Systems  Constitutional:  Negative for chills and fever.  Respiratory:  Positive for shortness of breath. Negative for chest tightness.   Cardiovascular:  Negative for chest pain and palpitations.  Genitourinary:  Negative for difficulty urinating, dysuria and urgency.  Musculoskeletal:  Positive for back pain. Negative for joint swelling.  Neurological:  Negative for dizziness and weakness.    Physical Exam Updated Vital Signs BP 132/83   Pulse 83   Temp 98.2 F (36.8 C) (Oral)   Resp 15   SpO2 97%  Physical Exam Cardiovascular:     Rate and Rhythm: Normal rate.     Heart sounds: No murmur heard.    No  friction rub. No gallop.  Pulmonary:     Effort: Pulmonary effort is normal. No respiratory distress.     Breath sounds: No wheezing, rhonchi or rales.  Abdominal:     General: There is no distension.     Tenderness: There is no abdominal tenderness.  Musculoskeletal:        General: No swelling, tenderness, deformity or signs of injury.     Right lower leg: No edema.     Left lower leg: No edema.  Neurological:     Mental Status: She is alert and oriented to person, place, and time.     Sensory: Sensation is intact.     Motor: No weakness.    ED Course/ Medical Decision Making/ A&P    Medications Ordered in ED Medications  0.9 %  sodium chloride infusion ( Intravenous New Bag/Given 09/30/22 2024)  ondansetron (ZOFRAN) injection 4 mg (4 mg Intravenous Given 09/30/22 1825)  morphine (PF) 4 MG/ML injection 4 mg (4 mg Intravenous Given 09/30/22 2123)  metoCLOPramide (REGLAN) injection 10 mg (10 mg Intravenous Given 09/30/22 1909)  ketorolac (TORADOL) 15 MG/ML injection 15 mg (15 mg Intravenous Given 09/30/22 2123)  Medical Decision Making:    Stacy Kirk is a 77 y.o. female who presented to the ED today after a fall. Hip is not externally rotated and has no loss of sensation and has no weakness of the extremities. No chest palpitations, fevers, chills. Nausea due to pain.   Complete initial physical exam performed, notably the patient was in severe pain, but alert and oriented.    Reviewed and confirmed nursing documentation for past medical history, family history, social history.    Initial Plan:  Hip Workup- Xray of hips, lumbar spine, and knee.  Type and screen.  Screening labs including CBC and Metabolic panel to evaluate for infectious or metabolic etiology of disease.  CXR to evaluate for structural/infectious intrathoracic pathology.  EKG to evaluate for cardiac pathology. Objective evaluation as below reviewed with plan for close reassessment  Initial Study Results:    Laboratory  All laboratory results reviewed without evidence of clinically relevant pathology.    Results for orders placed or performed during the hospital encounter of 09/30/22  Lipase, blood  Result Value Ref Range   Lipase 40 11 - 51 U/L  Comprehensive metabolic panel  Result Value Ref Range   Sodium 140 135 - 145 mmol/L   Potassium 3.0 (L) 3.5 - 5.1 mmol/L   Chloride 99 98 - 111 mmol/L   CO2 26 22 - 32 mmol/L   Glucose, Bld 143 (H) 70 - 99 mg/dL   BUN 28 (H) 8 - 23 mg/dL   Creatinine, Ser 4.54 (H) 0.44 - 1.00 mg/dL   Calcium 8.2 (L) 8.9 - 10.3 mg/dL   Total Protein 5.7 (L) 6.5 - 8.1 g/dL   Albumin 3.2 (L) 3.5 - 5.0 g/dL   AST 19 15 - 41 U/L   ALT 17 0 - 44 U/L   Alkaline Phosphatase 44 38 - 126 U/L   Total Bilirubin 1.3 (H) 0.3 - 1.2 mg/dL   GFR, Estimated 49 (L) >60 mL/min   Anion gap 15 5 - 15  CBC  Result Value Ref Range   WBC 13.3 (H) 4.0 - 10.5 K/uL   RBC 4.89 3.87 - 5.11 MIL/uL   Hemoglobin 14.8 12.0 - 15.0 g/dL   HCT 09.8 11.9 - 14.7 %   MCV 92.6 80.0 - 100.0 fL   MCH 30.3 26.0 - 34.0 pg   MCHC 32.7 30.0 - 36.0 g/dL   RDW 82.9 56.2 - 13.0 %   Platelets 233 150 - 400 K/uL   nRBC 0.0 0.0 - 0.2 %  Type and screen [facility]  Result Value Ref Range   ABO/RH(D) B POS    Antibody Screen NEG    Sample Expiration      10/03/2022,2359 Performed at [facility], [address], [place], [place] [address]    CT Lumbar Spine Wo Contrast  Result Date: 09/30/2022 CLINICAL DATA:  Low back pain, trauma.  Fall EXAM: CT LUMBAR SPINE WITHOUT CONTRAST TECHNIQUE: Multidetector CT imaging of the lumbar spine was performed without intravenous contrast administration. Multiplanar CT image reconstructions were also generated. RADIATION DOSE REDUCTION: This exam was performed according to the departmental dose-optimization program which includes automated exposure control, adjustment of the mA and/or kV according to patient size and/or use of iterative  reconstruction technique. COMPARISON:  CT abdomen pelvis 11/10/2010 FINDINGS: Segmentation: 4 lumbar type vertebrae.  L1 noted to have ribs. Alignment: Stable grade 1 anterolisthesis of L5 on S1. Vertebrae: L4-L5 posterolateral and interbody fusion surgical hardware. Multilevel  moderate severe degenerative change of the spine. Posterior disc osteophyte complex formation of the L2-L3, L3-L4, L4-L5 levels. No associated severe osseous neural foraminal or central canal stenosis. Level. Likely acute L2 superior endplate compression fracture with at least 20% vertebral body height loss (7:57). Paraspinal and other soft tissues: Negative. Disc levels: Multilevel intervertebral disc space vacuum phenomenon. L4-L5 interbody fusion surgical hardware. Other: Aortic Atherosclerosis (ICD10-I70.0). Colonic diverticulosis. Small hiatal hernia. Healed sacral insufficiency fracture. IMPRESSION: 1. Likely acute L2 superior endplate compression fracture with at least 20% vertebral body height loss in a patient status post L4-L5 posterolateral interbody fusion surgical hardware (for counting purposes: L1 noted to have ribs.) 2.  Aortic Atherosclerosis (ICD10-I70.0). Electronically Signed   By: Tish Frederickson M.D.   On: 09/30/2022 21:33   CT HIP RIGHT WO CONTRAST  Result Date: 09/30/2022 CLINICAL DATA:  Hip trauma, fracture suspected, xray done EXAM: CT OF THE RIGHT HIP WITHOUT CONTRAST TECHNIQUE: Multidetector CT imaging of the right hip was performed according to the standard protocol. Multiplanar CT image reconstructions were also generated. RADIATION DOSE REDUCTION: This exam was performed according to the departmental dose-optimization program which includes automated exposure control, adjustment of the mA and/or kV according to patient size and/or use of iterative reconstruction technique. COMPARISON:  X-ray right hip 09/30/2022 FINDINGS: Bones/Joint/Cartilage No evidence of fracture, dislocation, or joint effusion. No pelvic  fracture or diastasis the visualized right pelvis. Mild degenerative changes of the right hip. Degenerative changes of the pubic symphysis. No evidence of severe arthropathy. No aggressive appearing focal bone abnormality. Ligaments Suboptimally assessed by CT. Muscles and Tendons Grossly unremarkable. Soft tissues Soft tissues are unremarkable. Other: Colonic diverticulosis.  Atherosclerotic plaque. IMPRESSION: 1.  Negative for acute traumatic injury. 2.  Aortic Atherosclerosis (ICD10-I70.0). Electronically Signed   By: Tish Frederickson M.D.   On: 09/30/2022 21:10   DG Chest 2 View  Result Date: 09/30/2022 CLINICAL DATA:  Status post fall. EXAM: CHEST - 2 VIEW COMPARISON:  Radiograph 08/31/2010 FINDINGS: Lung volumes are low. The heart is enlarged. Aortic atherosclerosis and tortuosity. No focal airspace disease, pleural effusion or pneumothorax. On limited assessment, no acute osseous abnormalities are seen. Thoracic scoliosis. IMPRESSION: 1. Low lung volumes without acute chest finding. 2. Cardiomegaly. Electronically Signed   By: Narda Rutherford M.D.   On: 09/30/2022 17:16   DG Knee 2 Views Right  Result Date: 09/30/2022 CLINICAL DATA:  Status post fall with pain. EXAM: RIGHT KNEE - 1-2 VIEW COMPARISON:  None Available. FINDINGS: Right knee arthroplasty in expected alignment. No acute or periprosthetic fracture. There has been patellar resurfacing. No significant knee joint effusion. Minor anterior soft tissue edema. IMPRESSION: Right knee arthroplasty without complication or acute fracture. Electronically Signed   By: Narda Rutherford M.D.   On: 09/30/2022 17:15   DG Lumbar Spine Complete  Result Date: 09/30/2022 CLINICAL DATA:  Status post fall with pain. EXAM: LUMBAR SPINE - COMPLETE 4+ VIEW COMPARISON:  Lumbar radiograph 09/24/2016 FINDINGS: Four non-rib-bearing lumbar vertebra, the lower most lumbar vertebra will be labeled L5-S1 prior radiograph. Mild levo scoliotic curvature, unchanged. Posterior  rod with intrapedicular screw fusion and interbody spacer at L4-L5. Chronic grade 1 anterolisthesis of L5 on S1. Minimal undulation of superior L2 endplate is new from prior exam, possible minor superior endplate compression deformity. Degenerative disc disease and facet hypertrophy at L3-L4. The bones are subjectively under mineralized. Aortic atherosclerosis. IMPRESSION: 1. Minimal undulation of superior L2 endplate is new from prior exam, possible minor superior endplate compression deformity. Recommend  correlation for point tenderness. 2. Otherwise unchanged appearance of the lumbar spine with scoliosis, postsurgical and degenerative change. L4-L5 hardware is intact. Electronically Signed   By: Narda Rutherford M.D.   On: 09/30/2022 17:14   DG Hip Unilat  With Pelvis 2-3 Views Right  Result Date: 09/30/2022 CLINICAL DATA:  Fall with hip pain. EXAM: DG HIP (WITH OR WITHOUT PELVIS) 2-3V RIGHT COMPARISON:  None Available. FINDINGS: There is no evidence of hip fracture or dislocation. Intact bony pelvis including pubic rami. Pubic symphysis and sacroiliac joints are congruent, both with mild degenerative change. Mild degenerative change of the hips. IMPRESSION: No fracture of the pelvis or right hip. Electronically Signed   By: Narda Rutherford M.D.   On: 09/30/2022 17:11    All images reviewed independently. Agree with radiology report at this time.    EKG EKG was reviewed. No acute cardiac concerns.    Consults:  Case discussed with Neurosurgery.   Reassessment and Plan:    Stacy Kirk is a 77 y.o. female presenting after a fall this afternoon when her husband attempted to lift her from the couch. Her PMH is significant for a lumbar fracture many years ago s/p fusion of L4 and L5. CT of the lumbar spine is significant for a compression fracture on L2. Neurosurgery was consulted and a brace was recommended. Ms. Caughell was given morphine during her admission but was significantly sleepy  after. She was then given tramadol. She is currently unable to walk due to pain, and takes several people to help her ambulate. She lives at home with her husband and special needs son. Given the acuity of her symptoms she will be admitted today.  Compression Fracture of L2  -TLSO Brace for ambulation -Admit to Hospitalist team  Clinical Impression:  1. Closed fracture of second lumbar vertebra, unspecified fracture morphology, initial encounter (HCC)      Admit   Final Clinical Impression(s) / ED Diagnoses Final diagnoses:  Closed fracture of second lumbar vertebra, unspecified fracture morphology, initial encounter [facility])    Rx / DC Orders ED Discharge Orders     None         Hassan Rowan, [place], MD 09/30/22 2219    Jacalyn Lefevre, MD 10/01/22 [phone]

## 2022-09-30 NOTE — ED Notes (Signed)
Patient refusing to keep BP cuff on. 

## 2022-09-30 NOTE — ED Notes (Signed)
Patient attempted to ambulate with walker and assistance of this RN. Patient unable to swing legs on own. Patient unable to stand without extensive assistance.

## 2022-09-30 NOTE — ED Triage Notes (Signed)
Pt BIB [facility] from home d/t slipping to ground while husband was trying to help her keep from falling. A/Ox4, does remember the event, denies hitting her head. Pt states that she hurt her Rt hip a long time ago (poor historian) unsure how long ago & this aggravated the pain she already has in her Rt hip from her past injury. Also reports she has been vomiting for 2 weeks, did receive 500 cc NS from EMS in a 20g Lt AC PIV plus 4 mg zofran. 206/138, 110 bpm, resp 17, CBG 137.

## 2022-09-30 NOTE — Hospital Course (Signed)
Stacy Kirk is a 77 y.o. female with medical history significant for T2DM, HTN, hypothyroidism, lumbar stenosis s/p L4-5 interbody fusion 2018, obesity who is admitted with acute L2 compression fracture.  She was unable to stand with multiple person assist therefore admission was requested for further pain control and therapy.

## 2022-09-30 NOTE — ED Notes (Signed)
Ortho tech dropped of TLSO brace. Stated that brace does not need to be on unless getting up to walk. Can be set at bedside.

## 2022-09-30 NOTE — ED Notes (Signed)
ED TO INPATIENT HANDOFF REPORT  ED Nurse Name and Phone #:  Marcello Moores [phone]  S Name/Age/Gender Stacy Kirk 77 y.o. female Room/Bed: 003C/003C  Code Status   Code Status: Prior  Home/SNF/Other Home Patient oriented to: self, place, time, and situation Is this baseline? Yes   Triage Complete: Triage complete  Chief Complaint Closed compression fracture of L2 lumbar vertebra, initial encounter (HCC) [S32.020A]  Triage Note Pt BIB [facility] from home d/t slipping to ground while husband was trying to help her keep from falling. A/Ox4, does remember the event, denies hitting her head. Pt states that she hurt her Rt hip a long time ago (poor historian) unsure how long ago & this aggravated the pain she already has in her Rt hip from her past injury. Also reports she has been vomiting for 2 weeks, did receive 500 cc NS from EMS in a 20g Lt AC PIV plus 4 mg zofran. 206/138, 110 bpm, resp 17, CBG 137.     Allergies Allergies  Allergen Reactions   Erythromycin Other (See Comments)    "tears" her stomach up. Major digestive upset.   Tramadol Shortness Of Breath and Other (See Comments)    Felt like lungs filled up; unable to lay down   Albumin (Human) Other (See Comments)    Pt unsure of reaction   Aspirin Itching and Other (See Comments)    Cause lethargy   Codeine Itching    Cause lethargy   Cortisone Other (See Comments)    NO STEROID INJECTIONS; headaches   Propoxyphene Other (See Comments)    Terrible reaction; headaches   Simvastatin Itching and Other (See Comments)    Severe muscle aches   Triamcinolone Acetonide Other (See Comments)    headache    Level of Care/Admitting Diagnosis ED Disposition     ED Disposition  Admit   Condition  --   Comment  Hospital Area: [facility] [[id number]]  Level of Care: Med-Surg [16]  May place patient in observation at [facility] or [facility] if equivalent level of care is available:: No  Covid Evaluation:  Asymptomatic - no recent exposure (last 10 days) testing not required  Diagnosis: Closed compression fracture of L2 lumbar vertebra, initial encounter [facility]) [[id number]]  Admitting Physician: Charlsie Quest [[id number]]  Attending Physician: Charlsie Quest [[id number]]          B Medical/Surgery History Past Medical History:  Diagnosis Date   Arthritis    Cancer (HCC)    ovarian, skin face- skin cancer   Complication of anesthesia    slow to awaken everday, even slower with anesthesia   Constipation    Diabetes mellitus    Type II   GERD (gastroesophageal reflux disease)    Headache 05/12/2015   "after a fall" headache for 8 months   Hemorrhoids    Hernia    History of kidney stones    passed   Hypertension    Hypothyroidism    Loss of appetite    Muscle spasm    back   Osteoporosis    Redness    abd wound   Urine frequency    unable to hold urine    Wears hearing aid    bilateral    Past Surgical History:  Procedure Laterality Date   ABDOMINAL HYSTERECTOMY     APPENDECTOMY     CHOLECYSTECTOMY     COLONOSCOPY W/ POLYPECTOMY     HERNIA REPAIR  Jun2012   open component  separation w biologic mesh (Dr. Luisa Hart)   TOE SURGERY Left    2nd toe   TONSILLECTOMY       A IV Location/Drains/Wounds Patient Lines/Drains/Airways Status     Active Line/Drains/Airways     Name Placement date Placement time Site Days   Peripheral IV 09/30/22 20 G Left Antecubital 09/30/22  1605  Antecubital  less than 1   Peripheral IV 09/30/22 22 G Posterior;Right Hand 09/30/22  2244  Hand  less than 1   Incision (Closed) 09/07/16 Back 09/07/16  1029  -- 2214            Intake/Output Last 24 hours No intake or output data in the 24 hours ending 09/30/22 2301  Labs/Imaging Results for orders placed or performed during the hospital encounter of 09/30/22 (from the past 48 hour(s))  Lipase, blood     Status: None   Collection Time: 09/30/22  3:59 PM  Result Value Ref Range   Lipase  40 11 - 51 U/L    Comment: Performed at [facility], [address], [place], [place] [address]  Comprehensive metabolic panel     Status: Abnormal   Collection Time: 09/30/22  3:59 PM  Result Value Ref Range   Sodium 140 135 - 145 mmol/L   Potassium 3.0 (L) 3.5 - 5.1 mmol/L   Chloride 99 98 - 111 mmol/L   CO2 26 22 - 32 mmol/L   Glucose, Bld 143 (H) 70 - 99 mg/dL    Comment: Glucose reference range applies only to samples taken after fasting for at least 8 hours.   BUN 28 (H) 8 - 23 mg/dL   Creatinine, Ser 9.56 (H) 0.44 - 1.00 mg/dL   Calcium 8.2 (L) 8.9 - 10.3 mg/dL   Total Protein 5.7 (L) 6.5 - 8.1 g/dL   Albumin 3.2 (L) 3.5 - 5.0 g/dL   AST 19 15 - 41 U/L   ALT 17 0 - 44 U/L   Alkaline Phosphatase 44 38 - 126 U/L   Total Bilirubin 1.3 (H) 0.3 - 1.2 mg/dL   GFR, Estimated 49 (L) >60 mL/min    Comment: (NOTE) Calculated using the CKD-EPI Creatinine Equation (2021)    Anion gap 15 5 - 15    Comment: Performed at [facility], [address], [place], [place] [address]  CBC     Status: Abnormal   Collection Time: 09/30/22  3:59 PM  Result Value Ref Range   WBC 13.3 (H) 4.0 - 10.5 K/uL   RBC 4.89 3.87 - 5.11 MIL/uL   Hemoglobin 14.8 12.0 - 15.0 g/dL   HCT 65.7 84.6 - 96.2 %   MCV 92.6 80.0 - 100.0 fL   MCH 30.3 26.0 - 34.0 pg   MCHC 32.7 30.0 - 36.0 g/dL   RDW 95.2 84.1 - 32.4 %   Platelets 233 150 - 400 K/uL   nRBC 0.0 0.0 - 0.2 %    Comment: Performed at [facility], [address], [place], [place] [address]  Type and screen [facility]     Status: None   Collection Time: 09/30/22  6:28 PM  Result Value Ref Range   ABO/RH(D) B POS    Antibody Screen NEG    Sample Expiration      10/03/2022,2359 Performed at [facility], [address] [place], [place] [address]    CT Lumbar Spine Wo Contrast  Result Date: 09/30/2022 CLINICAL DATA:  Low  back pain, trauma.  Fall EXAM: CT LUMBAR SPINE WITHOUT CONTRAST TECHNIQUE:  Multidetector CT imaging of the lumbar spine was performed without intravenous contrast administration. Multiplanar CT image reconstructions were also generated. RADIATION DOSE REDUCTION: This exam was performed according to the departmental dose-optimization program which includes automated exposure control, adjustment of the mA and/or kV according to patient size and/or use of iterative reconstruction technique. COMPARISON:  CT abdomen pelvis 11/10/2010 FINDINGS: Segmentation: 4 lumbar type vertebrae.  L1 noted to have ribs. Alignment: Stable grade 1 anterolisthesis of L5 on S1. Vertebrae: L4-L5 posterolateral and interbody fusion surgical hardware. Multilevel moderate severe degenerative change of the spine. Posterior disc osteophyte complex formation of the L2-L3, L3-L4, L4-L5 levels. No associated severe osseous neural foraminal or central canal stenosis. Level. Likely acute L2 superior endplate compression fracture with at least 20% vertebral body height loss (7:57). Paraspinal and other soft tissues: Negative. Disc levels: Multilevel intervertebral disc space vacuum phenomenon. L4-L5 interbody fusion surgical hardware. Other: Aortic Atherosclerosis (ICD10-I70.0). Colonic diverticulosis. Small hiatal hernia. Healed sacral insufficiency fracture. IMPRESSION: 1. Likely acute L2 superior endplate compression fracture with at least 20% vertebral body height loss in a patient status post L4-L5 posterolateral interbody fusion surgical hardware (for counting purposes: L1 noted to have ribs.) 2.  Aortic Atherosclerosis (ICD10-I70.0). Electronically Signed   By: Tish Frederickson M.D.   On: 09/30/2022 21:33   CT HIP RIGHT WO CONTRAST  Result Date: 09/30/2022 CLINICAL DATA:  Hip trauma, fracture suspected, xray done EXAM: CT OF THE RIGHT HIP WITHOUT CONTRAST TECHNIQUE: Multidetector CT imaging of the right hip was performed according to the standard protocol. Multiplanar CT image reconstructions were also generated.  RADIATION DOSE REDUCTION: This exam was performed according to the departmental dose-optimization program which includes automated exposure control, adjustment of the mA and/or kV according to patient size and/or use of iterative reconstruction technique. COMPARISON:  X-ray right hip 09/30/2022 FINDINGS: Bones/Joint/Cartilage No evidence of fracture, dislocation, or joint effusion. No pelvic fracture or diastasis the visualized right pelvis. Mild degenerative changes of the right hip. Degenerative changes of the pubic symphysis. No evidence of severe arthropathy. No aggressive appearing focal bone abnormality. Ligaments Suboptimally assessed by CT. Muscles and Tendons Grossly unremarkable. Soft tissues Soft tissues are unremarkable. Other: Colonic diverticulosis.  Atherosclerotic plaque. IMPRESSION: 1.  Negative for acute traumatic injury. 2.  Aortic Atherosclerosis (ICD10-I70.0). Electronically Signed   By: Tish Frederickson M.D.   On: 09/30/2022 21:10   DG Chest 2 View  Result Date: 09/30/2022 CLINICAL DATA:  Status post fall. EXAM: CHEST - 2 VIEW COMPARISON:  Radiograph 08/31/2010 FINDINGS: Lung volumes are low. The heart is enlarged. Aortic atherosclerosis and tortuosity. No focal airspace disease, pleural effusion or pneumothorax. On limited assessment, no acute osseous abnormalities are seen. Thoracic scoliosis. IMPRESSION: 1. Low lung volumes without acute chest finding. 2. Cardiomegaly. Electronically Signed   By: Narda Rutherford M.D.   On: 09/30/2022 17:16   DG Knee 2 Views Right  Result Date: 09/30/2022 CLINICAL DATA:  Status post fall with pain. EXAM: RIGHT KNEE - 1-2 VIEW COMPARISON:  None Available. FINDINGS: Right knee arthroplasty in expected alignment. No acute or periprosthetic fracture. There has been patellar resurfacing. No significant knee joint effusion. Minor anterior soft tissue edema. IMPRESSION: Right knee arthroplasty without complication or acute fracture. Electronically Signed   By:  Narda Rutherford M.D.   On: 09/30/2022 17:15   DG Lumbar Spine Complete  Result Date: 09/30/2022 CLINICAL DATA:  Status post fall with pain. EXAM: LUMBAR  SPINE - COMPLETE 4+ VIEW COMPARISON:  Lumbar radiograph 09/24/2016 FINDINGS: Four non-rib-bearing lumbar vertebra, the lower most lumbar vertebra will be labeled L5-S1 prior radiograph. Mild levo scoliotic curvature, unchanged. Posterior rod with intrapedicular screw fusion and interbody spacer at L4-L5. Chronic grade 1 anterolisthesis of L5 on S1. Minimal undulation of superior L2 endplate is new from prior exam, possible minor superior endplate compression deformity. Degenerative disc disease and facet hypertrophy at L3-L4. The bones are subjectively under mineralized. Aortic atherosclerosis. IMPRESSION: 1. Minimal undulation of superior L2 endplate is new from prior exam, possible minor superior endplate compression deformity. Recommend correlation for point tenderness. 2. Otherwise unchanged appearance of the lumbar spine with scoliosis, postsurgical and degenerative change. L4-L5 hardware is intact. Electronically Signed   By: Narda Rutherford M.D.   On: 09/30/2022 17:14   DG Hip Unilat  With Pelvis 2-3 Views Right  Result Date: 09/30/2022 CLINICAL DATA:  Fall with hip pain. EXAM: DG HIP (WITH OR WITHOUT PELVIS) 2-3V RIGHT COMPARISON:  None Available. FINDINGS: There is no evidence of hip fracture or dislocation. Intact bony pelvis including pubic rami. Pubic symphysis and sacroiliac joints are congruent, both with mild degenerative change. Mild degenerative change of the hips. IMPRESSION: No fracture of the pelvis or right hip. Electronically Signed   By: Narda Rutherford M.D.   On: 09/30/2022 17:11    Pending Labs Unresulted Labs (From admission, onward)    None       Vitals/Pain Today's Vitals   09/30/22 1915 09/30/22 1944 09/30/22 1945 09/30/22 2158  BP: 105/72  132/83   Pulse: 95  83   Resp: 16  15   Temp:  98.2 F (36.8 C)     TempSrc:  Oral    SpO2: 97%  97%   PainSc:    0-No pain    Isolation Precautions No active isolations  Medications Medications  0.9 %  sodium chloride infusion ( Intravenous New Bag/Given 09/30/22 2024)  ondansetron (ZOFRAN) injection 4 mg (4 mg Intravenous Given 09/30/22 1825)  morphine (PF) 4 MG/ML injection 4 mg (4 mg Intravenous Given 09/30/22 2123)  metoCLOPramide (REGLAN) injection 10 mg (10 mg Intravenous Given 09/30/22 1909)  ketorolac (TORADOL) 15 MG/ML injection 15 mg (15 mg Intravenous Given 09/30/22 2123)    Mobility walks with device (walks with cane at home, was unable to ambulate when trying today.)     Focused Assessments     R Recommendations: See Admitting Provider Note  Report given to:   Additional Notes:

## 2022-09-30 NOTE — H&P (Signed)
History and Physical    BERNETT RAYSOR WUJ:[id number] DOB: December 26, 1945 DOA: 09/30/2022  PCP: Eden Emms, NP  Patient coming from: Home  I have personally briefly reviewed patient's old medical records in [facility]  Chief Complaint: Fall  HPI: Stacy Kirk is a 77 y.o. female with medical history significant for T2DM, HTN, hypothyroidism, lumbar stenosis s/p L4-5 interbody fusion 2018, obesity who presented to the ED for evaluation after a fall at home.  Patient states that she normally ambulates with a cane.  Her husband was trying to help her off the couch earlier today when she slipped and fell.  She landed on her bottom.  She developed significant posterior right hip pain.  She was unable to stand on her own.  EMS were called and she was brought to the ED for further evaluation.  She did not hit her head or lose consciousness.  She says pain is tolerable after she received IV morphine however still has not been able to stand with assistance.  She lives at home with her husband and her special needs son.  ED Course  Labs/Imaging on admission: I have personally reviewed following labs and imaging studies.  Initial vitals showed BP 185/76, pulse 32, RR 18, temp 98.5 F, SpO2 95% on room air.  Labs show sodium 140, potassium 3.0, bicarb 26, BUN 28, creatinine 1.13, serum glucose 143, WBC 13.3, hemoglobin 14.8, platelets 233,000.  Right hip x-ray negative for fracture.  2 view chest x-ray showed cardiomegaly, low lung volumes, no focal consolidation, edema, or effusion.  Right knee x-ray showed right knee arthroplasty without complication or acute fracture.  X-ray L-spine showed possible L2 compression deformity.  CT L-spine showed acute L2 superior endplate compression fracture with at least 20% vertebral body height loss.  S/p L4-L5 posterolateral interbody fusion surgical hardware noted.  CT right hip negative for acute traumatic injury.  Patient was given IV  morphine 4 mg, IV Toradol 15 mg, Zofran and Reglan.  EDP consulted on-call neurosurgery who recommended TLSO brace.  Patient was unable to ambulate or even stand despite 3 person assistance and felt unsafe to discharge home.  The hospitalist service was consulted to admit for further evaluation and management.  Review of Systems: All systems reviewed and are negative except as documented in history of present illness above.   Past Medical History:  Diagnosis Date   Arthritis    Cancer (HCC)    ovarian, skin face- skin cancer   Complication of anesthesia    slow to awaken everday, even slower with anesthesia   Constipation    Diabetes mellitus    Type II   GERD (gastroesophageal reflux disease)    Headache 05/12/2015   "after a fall" headache for 8 months   Hemorrhoids    Hernia    History of kidney stones    passed   Hypertension    Hypothyroidism    Loss of appetite    Muscle spasm    back   Osteoporosis    Redness    abd wound   Urine frequency    unable to hold urine    Wears hearing aid    bilateral     Past Surgical History:  Procedure Laterality Date   ABDOMINAL HYSTERECTOMY     APPENDECTOMY     CHOLECYSTECTOMY     COLONOSCOPY W/ POLYPECTOMY     HERNIA REPAIR  Jun2012   open component separation w biologic mesh (Dr. Luisa Hart)  TOE SURGERY Left    2nd toe   TONSILLECTOMY      Social History:  reports that she quit smoking about 24 years ago. Her smoking use included cigarettes. She has never used smokeless tobacco. She reports that she does not drink alcohol and does not use drugs.  Allergies  Allergen Reactions   Erythromycin Other (See Comments)    "tears" her stomach up. Major digestive upset.   Tramadol Shortness Of Breath and Other (See Comments)    Felt like lungs filled up; unable to lay down   Albumin (Human) Other (See Comments)    Pt unsure of reaction   Aspirin Itching and Other (See Comments)    Cause lethargy   Codeine Itching    Cause  lethargy   Cortisone Other (See Comments)    NO STEROID INJECTIONS; headaches   Propoxyphene Other (See Comments)    Terrible reaction; headaches   Simvastatin Itching and Other (See Comments)    Severe muscle aches   Triamcinolone Acetonide Other (See Comments)    headache    Family History  Problem Relation Age of Onset   Diabetes Mother    Early death Sister    Drug abuse Sister      Prior to Admission medications   Medication Sig Start Date End Date Taking? Authorizing Provider  acetaminophen (TYLENOL) 500 MG tablet Take 1,000 mg by mouth every 6 (six) hours as needed (FOR PAIN.).    [provider]  aspirin EC 81 MG tablet Take 81 mg by mouth every evening. Patient not taking: Reported on 08/05/2022    [provider]  baclofen (LIORESAL) 20 MG tablet Take 0.5 tablets (10 mg total) by mouth daily as needed for muscle spasms. 08/05/22   Eden Emms, NP  docusate sodium (COLACE) 100 MG capsule Take 100 mg by mouth daily at 8 pm.    [provider]  hydrochlorothiazide (HYDRODIURIL) 25 MG tablet Take 25 mg by mouth daily. Patient not taking: Reported on 08/05/2022 07/21/16   [provider]  Hydrocortisone (CORTIZONE-10 EX) Apply 1 application topically daily as needed (for itchy skin).    [provider]  ibuprofen (ADVIL,MOTRIN) 200 MG tablet Take 400 mg by mouth every 8 (eight) hours as needed (FOR PAIN).    [provider]  levothyroxine (SYNTHROID, LEVOTHROID) 100 MCG tablet Take 100 mcg by mouth daily before breakfast.  08/22/10   [provider]  linaclotide Karlene Einstein) 72 MCG capsule Take 1 capsule (72 mcg total) by mouth daily before breakfast. 08/05/22   Eden Emms, NP  magnesium gluconate (MAGONATE) 500 MG tablet Take 500 mg by mouth daily.    [provider]  Menthol, Topical Analgesic, (BENGAY EX) Apply 1 application topically 4 (four) times daily as needed (for back pain).    [provider]  Menthol-Methyl Salicylate (BENGAY GREASELESS EX) Apply topically.    [provider]  metFORMIN (GLUCOPHAGE) 500 MG tablet Take 1 tablet (500 mg total) by mouth daily after supper. 08/05/22   Eden Emms, NP  metoprolol succinate (TOPROL-XL) 25 MG 24 hr tablet Take 25 mg by mouth every evening. Patient not taking: Reported on 08/05/2022 08/01/16   [provider]  Multiple Vitamin (MULTIVITAMIN WITH MINERALS) TABS tablet Take 1 tablet by mouth daily. ESSENTIAL ONE SUPPLEMENT    [provider]  Multiple Vitamins-Minerals (HAIR/SKIN/NAILS PO) Take 1 capsule by mouth daily. HEALTHY HAIR,SKIN & NAILS    [provider]  predniSONE (  DELTASONE) 10 MG tablet Take 1 tablet (10 mg total) by mouth daily with breakfast. 08/05/22   Eden Emms, NP  Propylene Glycol ([employer] LUBRICANT DROPS) 0.6 % SOLN Place 1 drop into both eyes 3 (three) times daily as needed (for dry eyes). Patient not taking: Reported on 08/05/2022    [provider]    Physical Exam: Vitals:   09/30/22 1820 09/30/22 1915 09/30/22 1944 09/30/22 1945  BP: (!) 187/110 105/72  132/83  Pulse: 90 95  83  Resp: 11 16  15   Temp:   98.2 F (36.8 C)   TempSrc:   Oral   SpO2: 94% 97%  97%   Constitutional: Morbidly obese woman resting in bed, NAD, calm, comfortable Eyes: EOMI, lids and conjunctivae normal ENMT: Mucous membranes are moist. Posterior pharynx clear of any exudate or lesions.Normal dentition.  Neck: normal, supple, no masses. Respiratory: clear to auscultation bilaterally, no wheezing, no crackles. Normal respiratory effort. No accessory muscle use.  Cardiovascular: Regular rate and rhythm, no murmurs / rubs / gallops. No extremity edema. 2+ pedal pulses. Abdomen: no tenderness, no masses palpated.  Musculoskeletal: no clubbing / cyanosis. No joint deformity upper and lower extremities. Good ROM, no contractures. Normal muscle tone.  Skin: no rashes, lesions, ulcers. No  induration Neurologic: Sensation intact. Strength equal bilaterally while in bed.  Gait not assessed. Psychiatric: Alert and oriented x 3. Normal mood.   EKG: Personally reviewed. Sinus rhythm, rate 82, PAC present.  QTc 527.  Assessment/Plan Principal Problem:   Closed compression fracture of L2 lumbar vertebra, initial encounter [facility]) Active Problems:   Controlled type 2 diabetes mellitus with complication, without long-term current use of insulin (HCC)   Hypothyroidism   Hypokalemia   Hypertension   Stacy Kirk is a 77 y.o. female with medical history significant for T2DM, HTN, hypothyroidism, lumbar stenosis s/p L4-5 interbody fusion 2018, obesity who is admitted with acute L2 compression fracture.  She was unable to stand with multiple person assist therefore admission was requested for further pain control and therapy.  Assessment and Plan: Acute L2 compression fracture: Noted on CT imaging with at least 20% vertebral body height loss.  EDP discussed with on-call neurosurgery who recommended TLSO brace.  Patient was unable to stand or ambulate despite multiple person assist therefore admission was requested due to high fall/injury risk.  Of note, patient has prednisone listed on her home med list.  She is not sure if she is taking and or reason why she would be taking it.  Med rec is pending.  Will not reorder prednisone, monitor for any sign of adrenal insufficiency. -TLSO brace ordered -PT/OT eval -Fall precautions -Analgesics as needed  Hypokalemia: Oral supplementation given.  Type 2 diabetes: Placed on SSI.  Hypertension: Continue diltiazem.  Hypothyroidism: Continue Synthroid.   DVT prophylaxis: enoxaparin (LOVENOX) injection 40 mg Start: 10/01/22 2200 Code Status: Full code, confirmed with patient on admission Family Communication: Discussed with patient, she has discussed with family Disposition Plan: From home, dispo pending clinical progress Consults  called: EDP discussed with on-call neurosurgery Severity of Illness: The appropriate patient status for this patient is OBSERVATION. Observation status is judged to be reasonable and necessary in order to provide the required intensity of service to ensure the patient's safety. The patient's presenting symptoms, physical exam findings, and initial radiographic and laboratory data in the context of their medical condition is felt to place them at decreased risk for further clinical deterioration. Furthermore, it is anticipated that  the patient will be medically stable for discharge from the hospital within 2 midnights of admission.   Darreld Mclean MD [facility]  If 7PM-7AM, please contact night-coverage [email or web address]  09/30/2022, 11:12 PM

## 2022-09-30 NOTE — Progress Notes (Signed)
Orthopedic Tech Progress Note Patient Details:  Stacy Kirk Oct 19, 1945 [id number]  Ortho Devices Type of Ortho Device: Thoracolumbar corset (TLSO) Ortho Device/Splint Location: Back Ortho Device/Splint Interventions: Ordered      Al Decant 09/30/2022, 10:17 PM

## 2022-10-01 DIAGNOSIS — I10 Essential (primary) hypertension: Secondary | ICD-10-CM | POA: Diagnosis not present

## 2022-10-01 DIAGNOSIS — S32020A Wedge compression fracture of second lumbar vertebra, initial encounter for closed fracture: Secondary | ICD-10-CM | POA: Diagnosis not present

## 2022-10-01 LAB — GLUCOSE, CAPILLARY
Glucose-Capillary: 82 mg/dL (ref 70–99)
Glucose-Capillary: 106 mg/dL — ABNORMAL HIGH (ref 70–99)
Glucose-Capillary: 101 mg/dL — ABNORMAL HIGH (ref 70–99)
Glucose-Capillary: 87 mg/dL (ref 70–99)
Glucose-Capillary: 121 mg/dL — ABNORMAL HIGH (ref 70–99)

## 2022-10-01 LAB — CBC
HCT: 45.1 % (ref 36.0–46.0)
Hemoglobin: 14.8 g/dL (ref 12.0–15.0)
MCH: 30.2 pg (ref 26.0–34.0)
MCHC: 32.8 g/dL (ref 30.0–36.0)
MCV: 92 fL (ref 80.0–100.0)
Platelets: 221 10*3/uL (ref 150–400)
RBC: 4.9 MIL/uL (ref 3.87–5.11)
RDW: 13.6 % (ref 11.5–15.5)
WBC: 12.1 10*3/uL — ABNORMAL HIGH (ref 4.0–10.5)
nRBC: 0 % (ref 0.0–0.2)

## 2022-10-01 LAB — BASIC METABOLIC PANEL
Anion gap: 8 (ref 5–15)
BUN: 26 mg/dL — ABNORMAL HIGH (ref 8–23)
CO2: 28 mmol/L (ref 22–32)
Calcium: 8.6 mg/dL — ABNORMAL LOW (ref 8.9–10.3)
Chloride: 100 mmol/L (ref 98–111)
Creatinine, Ser: 1.15 mg/dL — ABNORMAL HIGH (ref 0.44–1.00)
GFR, Estimated: 48 mL/min — ABNORMAL LOW (ref >60)
Glucose, Bld: 113 mg/dL — ABNORMAL HIGH (ref 70–99)
Potassium: 4.4 mmol/L (ref 3.5–5.1)
Sodium: 136 mmol/L (ref 135–145)

## 2022-10-01 MED ORDER — ENSURE ENLIVE PO LIQD
237.0000 mL | Freq: Two times a day (BID) | ORAL | Status: DC
  Administered 2022-10-02 – 2022-10-08 (×7): 237 mL via ORAL

## 2022-10-01 NOTE — Plan of Care (Signed)

## 2022-10-01 NOTE — Evaluation (Signed)
Occupational Therapy Evaluation Patient Details Name: Stacy Kirk MRN: [id number] DOB: June 18, 1945 Today's Date: 10/01/2022   History of Present Illness The pt is a 77 yo female presenting 7/5 after a fall at home. Work up revealed acute L2 compression fx. PMH includes: obesity, arthritis, cancer, DM II, HTN, and hypothyroidism.   Clinical Impression   Pt s/p above diagnosis. Pt anxious, states she is feeling more confused and thinks it was the meds she received. Pt PLOF mod I at home, lives with husband and is caregiver for son, providing physical assistance. Pt currently requires significant assistance to perform trasnfers, STS, bed mobility, and unable to complete LB ADLs without assistance. Pt has minimal pain at rest, significant pain with certain movements. Pt would greatly benefit from continued skilled therapy to improve functional strength, endurance, and instruction on compensatory strategies to complete ADLs. Post acute rehab <3hrs/day is recommended to maximize function and allow for safe return home.      Recommendations for follow up therapy are one component of a multi-disciplinary discharge planning process, led by the attending physician.  Recommendations may be updated based on patient status, additional functional criteria and insurance authorization.   Assistance Recommended at Discharge Frequent or constant Supervision/Assistance  Patient can return home with the following A lot of help with walking and/or transfers;A lot of help with bathing/dressing/bathroom;Assistance with cooking/housework;Assist for transportation;Help with stairs or ramp for entrance    Functional Status Assessment  Patient has had a recent decline in their functional status and demonstrates the ability to make significant improvements in function in a reasonable and predictable amount of time.  Equipment Recommendations  Other (comment) (defer)    Recommendations for Other Services        Precautions / Restrictions Precautions Precautions: Fall;Back Precaution Booklet Issued: No Precaution Comments: discussed verbally for comfort, pt with poor adherance and memory Required Braces or Orthoses: Spinal Brace Spinal Brace: Thoracolumbosacral orthotic;Applied in sitting position Restrictions Weight Bearing Restrictions: No      Mobility Bed Mobility Overal bed mobility: Needs Assistance             General bed mobility comments: arrived with Pt in recliner    Transfers Overall transfer level: Needs assistance Equipment used: Rolling walker (2 wheels) Transfers: Sit to/from Stand, Bed to chair/wheelchair/BSC Sit to Stand: Mod assist, +2 safety/equipment     Step pivot transfers: Mod assist, +2 safety/equipment     General transfer comment: assist to [facility] x2 for safety      Balance Overall balance assessment: Needs assistance Sitting-balance support: Bilateral upper extremity supported, Feet supported Sitting balance-Leahy Scale: Poor Sitting balance - Comments: not ablet o sit upright without hands/elbows on armrests for support Postural control: Posterior lean, Right lateral lean Standing balance support: Bilateral upper extremity supported, During functional activity, Reliant on assistive device for balance Standing balance-Leahy Scale: Poor Standing balance comment: reliant on RW for support                           ADL either performed or assessed with clinical judgement   ADL Overall ADL's : Needs assistance/impaired Eating/Feeding: Independent   Grooming: Set up;Sitting   Upper Body Bathing: Moderate assistance;Sitting   Lower Body Bathing: Maximal assistance;Sitting/lateral leans   Upper Body Dressing : Minimal assistance;Sitting   Lower Body Dressing: Total assistance;Sit to/from stand   Toilet Transfer: Moderate assistance;BSC/3in1;Rolling walker (2 wheels);+2 for safety/equipment   Toileting- Clothing Manipulation and  Hygiene: Moderate assistance;Sitting/lateral  lean       Functional mobility during ADLs: Moderate assistance;+2 for safety/equipment;Rolling walker (2 wheels) General ADL Comments: Pt requires total for LB ADLs, not able to bend down, not able to reach back to perform perineal hygiene, mod A for transfers x2 for safety     Vision Baseline Vision/History: 1 Wears glasses Ability to See in Adequate Light: 1 Impaired Patient Visual Report: No change from baseline       Perception     Praxis      Pertinent Vitals/Pain Pain Assessment Pain Assessment: 0-10 Pain Score: 4  Faces Pain Scale: Hurts little more Pain Location: R hip/low back Pain Descriptors / Indicators: Discomfort, Grimacing, Moaning, Sharp Pain Intervention(s): Monitored during session     Hand Dominance     Extremity/Trunk Assessment Upper Extremity Assessment Upper Extremity Assessment: Overall WFL for tasks assessed   Lower Extremity Assessment Lower Extremity Assessment: Defer to PT evaluation       Communication Communication Communication: No difficulties   Cognition Arousal/Alertness: Awake/alert Behavior During Therapy: WFL for tasks assessed/performed, Impulsive Overall Cognitive Status: No family/caregiver present to determine baseline cognitive functioning Area of Impairment: Orientation, Memory, Safety/judgement, Awareness, Problem solving                 Orientation Level: Disoriented to, Time (knew month, not day or date)   Memory: Decreased recall of precautions, Decreased short-term memory   Safety/Judgement: Decreased awareness of safety Awareness: Intellectual Problem Solving: Slow processing, Decreased initiation, Difficulty sequencing, Requires verbal cues General Comments: Pt not able to tell month/day, did know year. Pt states she feels more confused than normal, HOH, overall able to follow commands and participate in therapy     General Comments       Exercises      Shoulder Instructions      Home Living Family/patient expects to be discharged to:: Private residence Living Arrangements: Spouse/significant other;Children (adult disabled son) Available Help at Discharge: Family;Available PRN/intermittently Type of Home: House Home Access: Ramped entrance     Home Layout: One level     Bathroom Shower/Tub: Producer, television/film/video: Handicapped height     Home Equipment: Cane - single point;Grab bars - toilet;Grab bars - tub/shower          Prior Functioning/Environment Prior Level of Function : Independent/Modified Independent             Mobility Comments: reports independence at home, intermittent use of cane, no other falls. physically assists adult son ADLs Comments: pt reports unable to reach her feet and spouse wont assist. pt does not drive after eye surgery        OT Problem List: Decreased strength;Decreased range of motion;Decreased activity tolerance;Impaired balance (sitting and/or standing);Decreased safety awareness;Decreased knowledge of use of DME or AE;Pain      OT Treatment/Interventions: Self-care/ADL training;Therapeutic exercise;Energy conservation;DME and/or AE instruction;Therapeutic activities    OT Goals(Current goals can be found in the care plan section) Acute Rehab OT Goals Patient Stated Goal: to return home, improve functional strength OT Goal Formulation: With patient Time For Goal Achievement: 10/15/22 Potential to Achieve Goals: Good  OT Frequency: Min 2X/week    Co-evaluation              AM-PAC OT "6 Clicks" Daily Activity     Outcome Measure Help from another person eating meals?: None Help from another person taking care of personal grooming?: A Little Help from another person toileting, which includes using toliet, bedpan,  or urinal?: A Lot Help from another person bathing (including washing, rinsing, drying)?: A Lot Help from another person to put on and taking off  regular upper body clothing?: A Little Help from another person to put on and taking off regular lower body clothing?: Total 6 Click Score: 15   End of Session Equipment Utilized During Treatment: Gait belt;Rolling walker (2 wheels) Nurse Communication: Mobility status  Activity Tolerance: Patient tolerated treatment well Patient left: in chair;with call bell/phone within reach;with chair alarm set  OT Visit Diagnosis: Unsteadiness on feet (R26.81);Other abnormalities of gait and mobility (R26.89);Muscle weakness (generalized) (M62.81);History of falling (Z91.81);Pain Pain - part of body:  (back)                Time: 1430-1505 OT Time Calculation (min): 35 min Charges:  OT General Charges $OT Visit: 1 Visit OT Evaluation $OT Eval Moderate Complexity: 1 Mod OT Treatments $Self Care/Home Management : 8-22 mins  [place], OTR/L   Alexis Goodell 10/01/2022, 3:16 PM

## 2022-10-01 NOTE — Progress Notes (Addendum)
[facility] PROGRESS NOTE   ARLOWE MARTINIE ZHY:[id number] DOB: 10-24-1945 DOA: 09/30/2022  PCP: Eden Emms, NP  Brief History/Interval Summary: 77 y.o. female with medical history significant for T2DM, HTN, hypothyroidism, lumbar stenosis s/p L4-5 interbody fusion 2018, obesity who presented to the ED for evaluation after a fall at home. Patient states that she normally ambulates with a cane.  Her husband was trying to help her off the couch earlier today when she slipped and fell.  She landed on her bottom.  She developed significant posterior right hip pain.  She was unable to stand on her own.  EMS were called and she was brought to the ED for further evaluation.  She did not hit her head or lose consciousness.  She says pain is tolerable after she received IV morphine however still has not been able to stand with assistance.  Evaluation reviewed L2 compression fracture.  She was hospitalized for further management.  Consultants: Full discussion with neurosurgery  Procedures: None    Subjective/Interval History: Complains of pain in the lower back.  Denies any radiation of the pain down her legs.  No other symptoms mentioned including chest pain shortness of breath nausea or vomiting.   Assessment/Plan:  Acute L2 compression fracture Detected on CT scan.  No other injuries noted on multiple imaging studies done in the emergency department. This is also in the setting of previous L4-L5 spinal fusion surgery which the patient mentions occurred several decades ago.  Hardware was noted to be intact on imaging studies.  Does not follow-up with back surgeon or neurosurgeon here in town. No neurological deficits noted. PT and OT evaluation is pending.  Case was discussed with neurosurgery who recommended TLSO brace which has been ordered. Pain control.  Hypokalemia Repleted.  Diabetes mellitus type 2 On SSI.  Essential hypertension Continue  diltiazem.  Hypothyroidism Continue with levothyroxine.  Questionable chronic steroid use Prednisone listed on home medication list but patient not aware of this.  Waiting on medication reconciliation.  Obesity Estimated body mass index is 36.3 kg/m as calculated from the following:   Height as of this encounter: 5' (1.524 m).   Weight as of this encounter: 84.3 kg.   DVT Prophylaxis: Lovenox Code Status: Full code Family Communication: Discussed with patient Disposition Plan: Hopefully return home when improved  Status is: Observation The patient remains OBS appropriate and may or may not d/c before 2 midnights.      Medications: Scheduled:  diltiazem  120 mg Oral Daily   enoxaparin (LOVENOX) injection  40 mg Subcutaneous Q24H   feeding supplement  237 mL Oral BID BM   insulin aspart  0-15 Units Subcutaneous TID WC   insulin aspart  0-5 Units Subcutaneous QHS   levothyroxine  100 mcg Oral Q0600   Continuous: EXB:[id number] **OR** acetaminophen, bisacodyl, HYDROcodone-acetaminophen, morphine injection, ondansetron **OR** ondansetron (ZOFRAN) IV, senna-docusate  Antibiotics: Anti-infectives (From admission, onward)    None       Objective:  Vital Signs  Vitals:   09/30/22 2352 10/01/22 0504 10/01/22 0826 10/01/22 1033  BP:  (!) 146/98 (!) 145/92 (!) 150/100  Pulse:  88 82 90  Resp:  (!) 21 15   Temp:  98.3 F (36.8 C) 98.2 F (36.8 C)   TempSrc:   Oral   SpO2:  92% 98%   Weight: 84.3 kg     Height: 5' (1.524 m)       Intake/Output Summary (Last 24 hours) at 10/01/2022 1117 Last  data filed at 10/01/2022 0600 Gross per 24 hour  Intake 683.24 ml  Output --  Net 683.24 ml   Filed Weights   09/30/22 2352  Weight: 84.3 kg    General appearance: Awake alert.  In no distress Resp: Clear to auscultation bilaterally.  Normal effort Cardio: S1-S2 is normal regular.  No S3-S4.  No rubs murmurs or bruit GI: Abdomen is soft.  Nontender nondistended.   Bowel sounds are present normal.  No masses organomegaly Extremities: No edema.  Able to move both of her lower extremities. Neurologic: Alert and oriented x3.  No focal neurological deficits.    Lab Results:  Data Reviewed: I have personally reviewed following labs and reports of the imaging studies  CBC: Recent Labs  Lab 09/30/22 1559 10/01/22 0131  WBC 13.3* 12.1*  HGB 14.8 14.8  HCT 45.3 45.1  MCV 92.6 92.0  PLT 233 221    Basic Metabolic Panel: Recent Labs  Lab 09/30/22 1559 10/01/22 0131  NA 140 136  K 3.0* 4.4  CL 99 100  CO2 26 28  GLUCOSE 143* 113*  BUN 28* 26*  CREATININE 1.13* 1.15*  CALCIUM 8.2* 8.6*  MG 1.2*  --     GFR: Estimated Creatinine Clearance: 38.2 mL/min (A) (by C-G formula based on SCr of 1.15 mg/dL (H)).  Liver Function Tests: Recent Labs  Lab 09/30/22 1559  AST 19  ALT 17  ALKPHOS 44  BILITOT 1.3*  PROT 5.7*  ALBUMIN 3.2*    Recent Labs  Lab 09/30/22 1559  LIPASE 40    CBG: Recent Labs  Lab 09/30/22 2332 10/01/22 0840  GLUCAP 110* 82      Radiology Studies: CT Lumbar Spine Wo Contrast  Result Date: 09/30/2022 CLINICAL DATA:  Low back pain, trauma.  Fall EXAM: CT LUMBAR SPINE WITHOUT CONTRAST TECHNIQUE: Multidetector CT imaging of the lumbar spine was performed without intravenous contrast administration. Multiplanar CT image reconstructions were also generated. RADIATION DOSE REDUCTION: This exam was performed according to the departmental dose-optimization program which includes automated exposure control, adjustment of the mA and/or kV according to patient size and/or use of iterative reconstruction technique. COMPARISON:  CT abdomen pelvis 11/10/2010 FINDINGS: Segmentation: 4 lumbar type vertebrae.  L1 noted to have ribs. Alignment: Stable grade 1 anterolisthesis of L5 on S1. Vertebrae: L4-L5 posterolateral and interbody fusion surgical hardware. Multilevel moderate severe degenerative change of the spine. Posterior  disc osteophyte complex formation of the L2-L3, L3-L4, L4-L5 levels. No associated severe osseous neural foraminal or central canal stenosis. Level. Likely acute L2 superior endplate compression fracture with at least 20% vertebral body height loss (7:57). Paraspinal and other soft tissues: Negative. Disc levels: Multilevel intervertebral disc space vacuum phenomenon. L4-L5 interbody fusion surgical hardware. Other: Aortic Atherosclerosis (ICD10-I70.0). Colonic diverticulosis. Small hiatal hernia. Healed sacral insufficiency fracture. IMPRESSION: 1. Likely acute L2 superior endplate compression fracture with at least 20% vertebral body height loss in a patient status post L4-L5 posterolateral interbody fusion surgical hardware (for counting purposes: L1 noted to have ribs.) 2.  Aortic Atherosclerosis (ICD10-I70.0). Electronically Signed   By: Tish Frederickson M.D.   On: 09/30/2022 21:33   CT HIP RIGHT WO CONTRAST  Result Date: 09/30/2022 CLINICAL DATA:  Hip trauma, fracture suspected, xray done EXAM: CT OF THE RIGHT HIP WITHOUT CONTRAST TECHNIQUE: Multidetector CT imaging of the right hip was performed according to the standard protocol. Multiplanar CT image reconstructions were also generated. RADIATION DOSE REDUCTION: This exam was performed according to the departmental dose-optimization  program which includes automated exposure control, adjustment of the mA and/or kV according to patient size and/or use of iterative reconstruction technique. COMPARISON:  X-ray right hip 09/30/2022 FINDINGS: Bones/Joint/Cartilage No evidence of fracture, dislocation, or joint effusion. No pelvic fracture or diastasis the visualized right pelvis. Mild degenerative changes of the right hip. Degenerative changes of the pubic symphysis. No evidence of severe arthropathy. No aggressive appearing focal bone abnormality. Ligaments Suboptimally assessed by CT. Muscles and Tendons Grossly unremarkable. Soft tissues Soft tissues are  unremarkable. Other: Colonic diverticulosis.  Atherosclerotic plaque. IMPRESSION: 1.  Negative for acute traumatic injury. 2.  Aortic Atherosclerosis (ICD10-I70.0). Electronically Signed   By: Tish Frederickson M.D.   On: 09/30/2022 21:10   DG Chest 2 View  Result Date: 09/30/2022 CLINICAL DATA:  Status post fall. EXAM: CHEST - 2 VIEW COMPARISON:  Radiograph 08/31/2010 FINDINGS: Lung volumes are low. The heart is enlarged. Aortic atherosclerosis and tortuosity. No focal airspace disease, pleural effusion or pneumothorax. On limited assessment, no acute osseous abnormalities are seen. Thoracic scoliosis. IMPRESSION: 1. Low lung volumes without acute chest finding. 2. Cardiomegaly. Electronically Signed   By: Narda Rutherford M.D.   On: 09/30/2022 17:16   DG Knee 2 Views Right  Result Date: 09/30/2022 CLINICAL DATA:  Status post fall with pain. EXAM: RIGHT KNEE - 1-2 VIEW COMPARISON:  None Available. FINDINGS: Right knee arthroplasty in expected alignment. No acute or periprosthetic fracture. There has been patellar resurfacing. No significant knee joint effusion. Minor anterior soft tissue edema. IMPRESSION: Right knee arthroplasty without complication or acute fracture. Electronically Signed   By: Narda Rutherford M.D.   On: 09/30/2022 17:15   DG Lumbar Spine Complete  Result Date: 09/30/2022 CLINICAL DATA:  Status post fall with pain. EXAM: LUMBAR SPINE - COMPLETE 4+ VIEW COMPARISON:  Lumbar radiograph 09/24/2016 FINDINGS: Four non-rib-bearing lumbar vertebra, the lower most lumbar vertebra will be labeled L5-S1 prior radiograph. Mild levo scoliotic curvature, unchanged. Posterior rod with intrapedicular screw fusion and interbody spacer at L4-L5. Chronic grade 1 anterolisthesis of L5 on S1. Minimal undulation of superior L2 endplate is new from prior exam, possible minor superior endplate compression deformity. Degenerative disc disease and facet hypertrophy at L3-L4. The bones are subjectively under  mineralized. Aortic atherosclerosis. IMPRESSION: 1. Minimal undulation of superior L2 endplate is new from prior exam, possible minor superior endplate compression deformity. Recommend correlation for point tenderness. 2. Otherwise unchanged appearance of the lumbar spine with scoliosis, postsurgical and degenerative change. L4-L5 hardware is intact. Electronically Signed   By: Narda Rutherford M.D.   On: 09/30/2022 17:14   DG Hip Unilat  With Pelvis 2-3 Views Right  Result Date: 09/30/2022 CLINICAL DATA:  Fall with hip pain. EXAM: DG HIP (WITH OR WITHOUT PELVIS) 2-3V RIGHT COMPARISON:  None Available. FINDINGS: There is no evidence of hip fracture or dislocation. Intact bony pelvis including pubic rami. Pubic symphysis and sacroiliac joints are congruent, both with mild degenerative change. Mild degenerative change of the hips. IMPRESSION: No fracture of the pelvis or right hip. Electronically Signed   By: Narda Rutherford M.D.   On: 09/30/2022 17:11       LOS: 0 days    Rito Ehrlich  [facility] Pager on [email or web address]  10/01/2022, 11:17 AM

## 2022-10-01 NOTE — Evaluation (Signed)
Physical Therapy Evaluation Patient Details Name: Stacy Kirk MRN: [id number] DOB: 1945/08/22 Today's Date: 10/01/2022  History of Present Illness  The pt is a 77 yo female presenting 7/5 after a fall at home. Work up revealed acute L2 compression fx. PMH includes: obesity, arthritis, cancer, DM II, HTN, and hypothyroidism.   Clinical Impression  Pt in bed upon arrival of PT, agreeable to evaluation at this time. Prior to admission the pt was independent with intermittent use of cane for mobility, does not drive but does physically assist in care for her adult son. The pt now presents with limitations in functional mobility, strength, power, awareness, memory, and activity tolerance due to above dx and resulting pain, and will continue to benefit from skilled PT to address these deficits. The pt required significant cues for all mobility as well as assist of 2 people for sit-stand transfer and pivotal steps to recliner from EOB. The pt needed increased assist to power up, seemed to decrease effort as pain onset with initial stand, then resumed assist once raised to standing by PT and RN. She had no buckling with small steps but demos poor safety awareness, requires significant cues, and demos significant fear of continued falls with mobility. Will continue to benefit from skilled PT to progress independence with transfers and am hopeful that with continued mobility and pain control the pt could progress to return home with follow-up therapy, but at this time she requires more assist for mobility than is available to her at home and would likely continue to have falls at home.          Assistance Recommended at Discharge Frequent or constant Supervision/Assistance  If plan is discharge home, recommend the following:  Can travel by private vehicle  Two people to help with walking and/or transfers;A lot of help with bathing/dressing/bathroom;Assistance with cooking/housework;Assist for  transportation;Help with stairs or ramp for entrance;Direct supervision/assist for medications management   No    Equipment Recommendations Rolling walker (2 wheels);BSC/3in1  Recommendations for Other Services       Functional Status Assessment Patient has had a recent decline in their functional status and demonstrates the ability to make significant improvements in function in a reasonable and predictable amount of time.     Precautions / Restrictions Precautions Precautions: Fall;Back Precaution Booklet Issued: No Precaution Comments: discussed verbally for comfort, pt with poor adherance and memory Required Braces or Orthoses: Spinal Brace Spinal Brace: Thoracolumbosacral orthotic;Applied in sitting position (needs larger size) Restrictions Weight Bearing Restrictions: No      Mobility  Bed Mobility Overal bed mobility: Needs Assistance Bed Mobility: Rolling, Sidelying to Sit Rolling: Min assist Sidelying to sit: Mod assist, HOB elevated, +2 for physical assistance       General bed mobility comments: modA to elevate trunk as pt holding bed rail and stopped assisting with transition.    Transfers Overall transfer level: Needs assistance Equipment used: Rolling walker (2 wheels) Transfers: Sit to/from Stand, Bed to chair/wheelchair/BSC Sit to Stand: Mod assist, Max assist, +2 physical assistance   Step pivot transfers: Mod assist, +2 physical assistance       General transfer comment: pt needing increased assist to power up, seemed to decrease effort as pain onset with initial stand, then resumed assist once raised to standing by PT and RN. pt with no buckling. able to manage small pivotal steps but needing cues for each step    Ambulation/Gait  General Gait Details: limited to small lateral steps to pivot to recliner. pt needing cues for each step, assist to move RW, minimal clearance     Balance Overall balance assessment: Needs  assistance Sitting-balance support: Bilateral upper extremity supported, Feet supported Sitting balance-Leahy Scale: Poor   Postural control: Posterior lean, Right lateral lean Standing balance support: Bilateral upper extremity supported, During functional activity, Reliant on assistive device for balance Standing balance-Leahy Scale: Poor Standing balance comment: dependent on external assist and BUE support                             Pertinent Vitals/Pain Pain Assessment Pain Assessment: Faces Faces Pain Scale: Hurts even more Pain Location: R hip/low back Pain Descriptors / Indicators: Discomfort, Grimacing, Moaning, Sharp Pain Intervention(s): Limited activity within patient's tolerance, Monitored during session, Repositioned, Premedicated before session    Home Living Family/patient expects to be discharged to:: Private residence Living Arrangements: Spouse/significant other;Children (adult disabled son) Available Help at Discharge: Family;Available PRN/intermittently Type of Home: House Home Access: Ramped entrance       Home Layout: One level Home Equipment: Cane - single point;Grab bars - toilet;Grab bars - tub/shower      Prior Function Prior Level of Function : Independent/Modified Independent             Mobility Comments: reports independence at home, intermittent use of cane, no other falls. physically assists adult son ADLs Comments: pt reports unable to reach her feet and spouse wont assist. pt does not drive after eye surgery     Hand Dominance        Extremity/Trunk Assessment   Upper Extremity Assessment Upper Extremity Assessment: Generalized weakness    Lower Extremity Assessment Lower Extremity Assessment: Generalized weakness (reports sensation intact bialterally, pt grossly able to complete MMT 4/5. limited power for functional tasks due to pain)    Cervical / Trunk Assessment Cervical / Trunk Assessment: Other  exceptions Cervical / Trunk Exceptions: back pain, large body habitus  Communication   Communication: No difficulties  Cognition Arousal/Alertness: Awake/alert Behavior During Therapy: WFL for tasks assessed/performed, Impulsive Overall Cognitive Status: No family/caregiver present to determine baseline cognitive functioning Area of Impairment: Orientation, Memory, Safety/judgement, Awareness, Problem solving                 Orientation Level: Disoriented to, Time (knew month, not day or date)   Memory: Decreased recall of precautions, Decreased short-term memory   Safety/Judgement: Decreased awareness of safety Awareness: Intellectual Problem Solving: Slow processing, Decreased initiation, Difficulty sequencing, Requires verbal cues General Comments: pt significantly distracted by pain and perseverating on lack of assist from her husband at home and lack of cinnamon on her breakfast tray. pt needing cues for all mobility and often leaning to L with no awareness.        General Comments General comments (skin integrity, edema, etc.): VSS on RA, TLSO too small        Assessment/Plan    PT Assessment Patient needs continued PT services  PT Problem List Decreased strength;Decreased activity tolerance;Decreased balance;Decreased mobility;Decreased safety awareness;Obesity;Pain       PT Treatment Interventions DME instruction;Gait training;Stair training;Functional mobility training;Therapeutic activities;Therapeutic exercise;Balance training;Patient/family education    PT Goals (Current goals can be found in the Care Plan section)  Acute Rehab PT Goals Patient Stated Goal: reduce pain PT Goal Formulation: With patient Time For Goal Achievement: 10/15/22 Potential to Achieve Goals: Good  Frequency Min 3X/week        AM-PAC PT "6 Clicks" Mobility  Outcome Measure Help needed turning from your back to your side while in a flat bed without using bedrails?: A  Little Help needed moving from lying on your back to sitting on the side of a flat bed without using bedrails?: A Lot Help needed moving to and from a bed to a chair (including a wheelchair)?: Total Help needed standing up from a chair using your arms (e.g., wheelchair or bedside chair)?: Total Help needed to walk in hospital room?: Total (<20 ft) Help needed climbing 3-5 steps with a railing? : Total 6 Click Score: 9    End of Session Equipment Utilized During Treatment: Gait belt Activity Tolerance: Patient tolerated treatment well;Patient limited by pain Patient left: in chair;with call bell/phone within reach;with chair alarm set Nurse Communication: Mobility status PT Visit Diagnosis: Unsteadiness on feet (R26.81);Muscle weakness (generalized) (M62.81);Other abnormalities of gait and mobility (R26.89);Pain Pain - Right/Left: Right Pain - part of body:  (back)    Time: 1610-9604 PT Time Calculation (min) (ACUTE ONLY): 31 min   Charges:   PT Evaluation $PT Eval Low Complexity: 1 Low PT Treatments $Therapeutic Activity: 8-22 mins PT General Charges $$ ACUTE PT VISIT: 1 Visit         Vickki Muff, PT, DPT   Acute Rehabilitation Department Office [phone] Secure Chat Communication Preferred  Ronnie Derby 10/01/2022, 11:48 AM

## 2022-10-02 DIAGNOSIS — S32029A Unspecified fracture of second lumbar vertebra, initial encounter for closed fracture: Secondary | ICD-10-CM | POA: Diagnosis present

## 2022-10-02 DIAGNOSIS — M7732 Calcaneal spur, left foot: Secondary | ICD-10-CM | POA: Diagnosis not present

## 2022-10-02 DIAGNOSIS — I82461 Acute embolism and thrombosis of right calf muscular vein: Secondary | ICD-10-CM | POA: Diagnosis present

## 2022-10-02 DIAGNOSIS — Z96651 Presence of right artificial knee joint: Secondary | ICD-10-CM | POA: Diagnosis present

## 2022-10-02 DIAGNOSIS — Z7952 Long term (current) use of systemic steroids: Secondary | ICD-10-CM | POA: Diagnosis not present

## 2022-10-02 DIAGNOSIS — I2699 Other pulmonary embolism without acute cor pulmonale: Secondary | ICD-10-CM | POA: Diagnosis present

## 2022-10-02 DIAGNOSIS — I700 Atherosclerosis of aorta: Secondary | ICD-10-CM | POA: Diagnosis not present

## 2022-10-02 DIAGNOSIS — Z981 Arthrodesis status: Secondary | ICD-10-CM | POA: Diagnosis not present

## 2022-10-02 DIAGNOSIS — M25572 Pain in left ankle and joints of left foot: Secondary | ICD-10-CM | POA: Diagnosis not present

## 2022-10-02 DIAGNOSIS — R4182 Altered mental status, unspecified: Secondary | ICD-10-CM | POA: Diagnosis not present

## 2022-10-02 DIAGNOSIS — I2693 Single subsegmental pulmonary embolism without acute cor pulmonale: Secondary | ICD-10-CM | POA: Diagnosis not present

## 2022-10-02 DIAGNOSIS — Z79890 Hormone replacement therapy (postmenopausal): Secondary | ICD-10-CM | POA: Diagnosis not present

## 2022-10-02 DIAGNOSIS — Y92009 Unspecified place in unspecified non-institutional (private) residence as the place of occurrence of the external cause: Secondary | ICD-10-CM | POA: Diagnosis not present

## 2022-10-02 DIAGNOSIS — E876 Hypokalemia: Secondary | ICD-10-CM | POA: Diagnosis present

## 2022-10-02 DIAGNOSIS — Z85828 Personal history of other malignant neoplasm of skin: Secondary | ICD-10-CM | POA: Diagnosis not present

## 2022-10-02 DIAGNOSIS — N390 Urinary tract infection, site not specified: Secondary | ICD-10-CM | POA: Diagnosis present

## 2022-10-02 DIAGNOSIS — E039 Hypothyroidism, unspecified: Secondary | ICD-10-CM | POA: Diagnosis present

## 2022-10-02 DIAGNOSIS — Z6836 Body mass index (BMI) 36.0-36.9, adult: Secondary | ICD-10-CM | POA: Diagnosis not present

## 2022-10-02 DIAGNOSIS — I517 Cardiomegaly: Secondary | ICD-10-CM | POA: Diagnosis not present

## 2022-10-02 DIAGNOSIS — E118 Type 2 diabetes mellitus with unspecified complications: Secondary | ICD-10-CM | POA: Diagnosis present

## 2022-10-02 DIAGNOSIS — M810 Age-related osteoporosis without current pathological fracture: Secondary | ICD-10-CM | POA: Diagnosis present

## 2022-10-02 DIAGNOSIS — M7989 Other specified soft tissue disorders: Secondary | ICD-10-CM | POA: Diagnosis not present

## 2022-10-02 DIAGNOSIS — J398 Other specified diseases of upper respiratory tract: Secondary | ICD-10-CM | POA: Diagnosis not present

## 2022-10-02 DIAGNOSIS — I10 Essential (primary) hypertension: Secondary | ICD-10-CM

## 2022-10-02 DIAGNOSIS — S32000A Wedge compression fracture of unspecified lumbar vertebra, initial encounter for closed fracture: Secondary | ICD-10-CM | POA: Diagnosis present

## 2022-10-02 DIAGNOSIS — I350 Nonrheumatic aortic (valve) stenosis: Secondary | ICD-10-CM | POA: Diagnosis present

## 2022-10-02 DIAGNOSIS — Z881 Allergy status to other antibiotic agents status: Secondary | ICD-10-CM | POA: Diagnosis not present

## 2022-10-02 DIAGNOSIS — K5730 Diverticulosis of large intestine without perforation or abscess without bleeding: Secondary | ICD-10-CM | POA: Diagnosis not present

## 2022-10-02 DIAGNOSIS — W010XXA Fall on same level from slipping, tripping and stumbling without subsequent striking against object, initial encounter: Secondary | ICD-10-CM | POA: Diagnosis present

## 2022-10-02 DIAGNOSIS — K449 Diaphragmatic hernia without obstruction or gangrene: Secondary | ICD-10-CM | POA: Diagnosis not present

## 2022-10-02 DIAGNOSIS — Z87891 Personal history of nicotine dependence: Secondary | ICD-10-CM | POA: Diagnosis not present

## 2022-10-02 DIAGNOSIS — S32020A Wedge compression fracture of second lumbar vertebra, initial encounter for closed fracture: Secondary | ICD-10-CM | POA: Diagnosis present

## 2022-10-02 DIAGNOSIS — R109 Unspecified abdominal pain: Secondary | ICD-10-CM | POA: Diagnosis not present

## 2022-10-02 DIAGNOSIS — K5900 Constipation, unspecified: Secondary | ICD-10-CM | POA: Diagnosis present

## 2022-10-02 DIAGNOSIS — I5022 Chronic systolic (congestive) heart failure: Secondary | ICD-10-CM | POA: Diagnosis present

## 2022-10-02 DIAGNOSIS — R1084 Generalized abdominal pain: Secondary | ICD-10-CM | POA: Diagnosis not present

## 2022-10-02 DIAGNOSIS — I110 Hypertensive heart disease with heart failure: Secondary | ICD-10-CM | POA: Diagnosis present

## 2022-10-02 DIAGNOSIS — K219 Gastro-esophageal reflux disease without esophagitis: Secondary | ICD-10-CM | POA: Diagnosis present

## 2022-10-02 DIAGNOSIS — M25551 Pain in right hip: Secondary | ICD-10-CM | POA: Diagnosis present

## 2022-10-02 LAB — GLUCOSE, CAPILLARY
Glucose-Capillary: 131 mg/dL — ABNORMAL HIGH (ref 70–99)
Glucose-Capillary: 112 mg/dL — ABNORMAL HIGH (ref 70–99)
Glucose-Capillary: 163 mg/dL — ABNORMAL HIGH (ref 70–99)
Glucose-Capillary: 102 mg/dL — ABNORMAL HIGH (ref 70–99)

## 2022-10-02 MED ORDER — BACLOFEN 10 MG PO TABS
10.0000 mg | ORAL_TABLET | Freq: Every day | ORAL | Status: DC | PRN
  Administered 2022-10-02 – 2022-10-10 (×5): 10 mg via ORAL
  Filled 2022-10-02 (×6): qty 1

## 2022-10-02 MED ORDER — PREDNISONE 10 MG PO TABS
10.0000 mg | ORAL_TABLET | Freq: Every day | ORAL | Status: DC
  Administered 2022-10-02 – 2022-10-10 (×8): 10 mg via ORAL
  Filled 2022-10-02 (×9): qty 1

## 2022-10-02 MED ORDER — LORAZEPAM 2 MG/ML IJ SOLN: 0.5000 mg | Freq: Once | INTRAMUSCULAR | Status: DC

## 2022-10-02 NOTE — TOC CAGE-AID Note (Signed)
Transition of Care [facility]) - CAGE-AID Screening   Patient Details  Name: Stacy Kirk MRN: [id number] Date of Birth: 01-May-1945  [facility]) CM/SW Contact:    Leota Sauers, RN Phone Number: 10/02/2022, 9:18 PM   Clinical Narrative:  Patient denies use of alcohol and illicit drugs. Education not offered at this time.  CAGE-AID Screening:    Have You Ever Felt You Ought to Cut Down on Your Drinking or Drug Use?: No Have People Annoyed You By Critizing Your Drinking Or Drug Use?: No Have You Felt Bad Or Guilty About Your Drinking Or Drug Use?: No Have You Ever Had a Drink or Used Drugs First Thing In The Morning to Steady Your Nerves or to Get Rid of a Hangover?: No CAGE-AID Score: 0  Substance Abuse Education Offered: No

## 2022-10-02 NOTE — Progress Notes (Addendum)
[facility] PROGRESS NOTE   TANGI PERI ZOX:[id number] DOB: Apr 28, 1945 DOA: 09/30/2022  PCP: Eden Emms, NP  Brief History/Interval Summary: 77 y.o. female with medical history significant for T2DM, HTN, hypothyroidism, lumbar stenosis s/p L4-5 interbody fusion 2018, obesity who presented to the ED for evaluation after a fall at home. Patient states that she normally ambulates with a cane.  Her husband was trying to help her off the couch earlier today when she slipped and fell.  She landed on her bottom.  She developed significant posterior right hip pain.  She was unable to stand on her own.  EMS were called and she was brought to the ED for further evaluation.  She did not hit her head or lose consciousness.  She says pain is tolerable after she received IV morphine however still has not been able to stand with assistance.  Evaluation reviewed L2 compression fracture.  She was hospitalized for further management.  Consultants: Phone discussion with neurosurgery  Procedures: None    Subjective/Interval History: Pain in the lower back appears to be improving.  Able to move her legs better than she was yesterday.  Denies any chest pain shortness of breath nausea or vomiting.     Assessment/Plan:  Acute L2 compression fracture Detected on CT scan.  No other injuries noted on multiple imaging studies done in the emergency department.  Case was discussed by ED PA with neurosurgery who recommended TLSO brace and physical therapy. This is also in the setting of previous L4-L5 spinal fusion surgery.  Patient initially gave the impression that the surgery occurred a long time ago but it appears that she had surgery performed by Dr. Wynetta Emery in 2018. She appears to be improving gradually.  Less pain and able to better move her lower extremities today than yesterday. Seen by physical therapy who recommended SNF yesterday but patient not very keen on this.  Continue current management  for now.  Continue with physical therapy in the hospital.  Hopefully she can improve enough to be able to go home with home health. Continue with TLSO brace when she is up and about.  Pain control.  AMS ?hospital delirium. Prolonged QT noted. Ativan x 1 as she is trying to climb out of bed.  Hypokalemia Repleted.  Diabetes mellitus type 2 On SSI.  Essential hypertension Continue diltiazem.  Hypothyroidism Continue with levothyroxine.  Chronic steroid use Based on medication reconciliation as well as notes from her PCP as recently as May 2024 she is on prednisone 10 mg daily.  This will be resumed.    Obesity Estimated body mass index is 36.3 kg/m as calculated from the following:   Height as of this encounter: 5' (1.524 m).   Weight as of this encounter: 84.3 kg.   DVT Prophylaxis: Lovenox Code Status: Full code Family Communication: Discussed with patient Disposition Plan: Hopefully return home when improved      Medications: Scheduled:  diltiazem  120 mg Oral Daily   enoxaparin (LOVENOX) injection  40 mg Subcutaneous Q24H   feeding supplement  237 mL Oral BID BM   insulin aspart  0-15 Units Subcutaneous TID WC   insulin aspart  0-5 Units Subcutaneous QHS   levothyroxine  100 mcg Oral Q0600   Continuous: WJX:BJYNWGNFAOZHY **OR** acetaminophen, bisacodyl, HYDROcodone-acetaminophen, morphine injection, ondansetron **OR** ondansetron (ZOFRAN) IV, senna-docusate  Antibiotics: Anti-infectives (From admission, onward)    None       Objective:  Vital Signs  Vitals:   10/01/22 1515  10/01/22 1937 10/02/22 0449 10/02/22 0732  BP: (!) 150/90 (!) 114/96 (!) 143/88 127/76  Pulse: 93 89 76 80  Resp: 16 18 18    Temp: 98.8 F (37.1 C) 98.6 F (37 C) 99 F (37.2 C) 97.9 F (36.6 C)  TempSrc: Oral Oral Oral Oral  SpO2: 97% 95% 97% 96%  Weight:      Height:        Intake/Output Summary (Last 24 hours) at 10/02/2022 0940 Last data filed at 10/02/2022 0528 Gross  per 24 hour  Intake 100 ml  Output 150 ml  Net -50 ml    Filed Weights   09/30/22 2352  Weight: 84.3 kg    General appearance: Awake alert.  In no distress Resp: Clear to auscultation bilaterally.  Normal effort Cardio: S1-S2 is normal regular.  No S3-S4.  No rubs murmurs or bruit GI: Abdomen is soft.  Nontender nondistended.  Bowel sounds are present normal.  No masses organomegaly Extremities: No edema.  Improved mobility of the lower extremities bilaterally. Neurologic: Alert and oriented x3.  No focal neurological deficits.     Lab Results:  Data Reviewed: I have personally reviewed following labs and reports of the imaging studies  CBC: Recent Labs  Lab 09/30/22 1559 10/01/22 0131  WBC 13.3* 12.1*  HGB 14.8 14.8  HCT 45.3 45.1  MCV 92.6 92.0  PLT 233 221     Basic Metabolic Panel: Recent Labs  Lab 09/30/22 1559 10/01/22 0131  NA 140 136  K 3.0* 4.4  CL 99 100  CO2 26 28  GLUCOSE 143* 113*  BUN 28* 26*  CREATININE 1.13* 1.15*  CALCIUM 8.2* 8.6*  MG 1.2*  --      GFR: Estimated Creatinine Clearance: 38.2 mL/min (A) (by C-G formula based on SCr of 1.15 mg/dL (H)).  Liver Function Tests: Recent Labs  Lab 09/30/22 1559  AST 19  ALT 17  ALKPHOS 44  BILITOT 1.3*  PROT 5.7*  ALBUMIN 3.2*     Recent Labs  Lab 09/30/22 1559  LIPASE 40     CBG: Recent Labs  Lab 10/01/22 1144 10/01/22 1553 10/01/22 1653 10/01/22 2039 10/02/22 0935  GLUCAP 106* 101* 87 121* 131*       Radiology Studies: CT Lumbar Spine Wo Contrast  Result Date: 09/30/2022 CLINICAL DATA:  Low back pain, trauma.  Fall EXAM: CT LUMBAR SPINE WITHOUT CONTRAST TECHNIQUE: Multidetector CT imaging of the lumbar spine was performed without intravenous contrast administration. Multiplanar CT image reconstructions were also generated. RADIATION DOSE REDUCTION: This exam was performed according to the departmental dose-optimization program which includes automated exposure  control, adjustment of the mA and/or kV according to patient size and/or use of iterative reconstruction technique. COMPARISON:  CT abdomen pelvis 11/10/2010 FINDINGS: Segmentation: 4 lumbar type vertebrae.  L1 noted to have ribs. Alignment: Stable grade 1 anterolisthesis of L5 on S1. Vertebrae: L4-L5 posterolateral and interbody fusion surgical hardware. Multilevel moderate severe degenerative change of the spine. Posterior disc osteophyte complex formation of the L2-L3, L3-L4, L4-L5 levels. No associated severe osseous neural foraminal or central canal stenosis. Level. Likely acute L2 superior endplate compression fracture with at least 20% vertebral body height loss (7:57). Paraspinal and other soft tissues: Negative. Disc levels: Multilevel intervertebral disc space vacuum phenomenon. L4-L5 interbody fusion surgical hardware. Other: Aortic Atherosclerosis (ICD10-I70.0). Colonic diverticulosis. Small hiatal hernia. Healed sacral insufficiency fracture. IMPRESSION: 1. Likely acute L2 superior endplate compression fracture with at least 20% vertebral body height loss in a patient  status post L4-L5 posterolateral interbody fusion surgical hardware (for counting purposes: L1 noted to have ribs.) 2.  Aortic Atherosclerosis (ICD10-I70.0). Electronically Signed   By: Tish Frederickson M.D.   On: 09/30/2022 21:33   CT HIP RIGHT WO CONTRAST  Result Date: 09/30/2022 CLINICAL DATA:  Hip trauma, fracture suspected, xray done EXAM: CT OF THE RIGHT HIP WITHOUT CONTRAST TECHNIQUE: Multidetector CT imaging of the right hip was performed according to the standard protocol. Multiplanar CT image reconstructions were also generated. RADIATION DOSE REDUCTION: This exam was performed according to the departmental dose-optimization program which includes automated exposure control, adjustment of the mA and/or kV according to patient size and/or use of iterative reconstruction technique. COMPARISON:  X-ray right hip 09/30/2022  FINDINGS: Bones/Joint/Cartilage No evidence of fracture, dislocation, or joint effusion. No pelvic fracture or diastasis the visualized right pelvis. Mild degenerative changes of the right hip. Degenerative changes of the pubic symphysis. No evidence of severe arthropathy. No aggressive appearing focal bone abnormality. Ligaments Suboptimally assessed by CT. Muscles and Tendons Grossly unremarkable. Soft tissues Soft tissues are unremarkable. Other: Colonic diverticulosis.  Atherosclerotic plaque. IMPRESSION: 1.  Negative for acute traumatic injury. 2.  Aortic Atherosclerosis (ICD10-I70.0). Electronically Signed   By: Tish Frederickson M.D.   On: 09/30/2022 21:10   DG Chest 2 View  Result Date: 09/30/2022 CLINICAL DATA:  Status post fall. EXAM: CHEST - 2 VIEW COMPARISON:  Radiograph 08/31/2010 FINDINGS: Lung volumes are low. The heart is enlarged. Aortic atherosclerosis and tortuosity. No focal airspace disease, pleural effusion or pneumothorax. On limited assessment, no acute osseous abnormalities are seen. Thoracic scoliosis. IMPRESSION: 1. Low lung volumes without acute chest finding. 2. Cardiomegaly. Electronically Signed   By: Narda Rutherford M.D.   On: 09/30/2022 17:16   DG Knee 2 Views Right  Result Date: 09/30/2022 CLINICAL DATA:  Status post fall with pain. EXAM: RIGHT KNEE - 1-2 VIEW COMPARISON:  None Available. FINDINGS: Right knee arthroplasty in expected alignment. No acute or periprosthetic fracture. There has been patellar resurfacing. No significant knee joint effusion. Minor anterior soft tissue edema. IMPRESSION: Right knee arthroplasty without complication or acute fracture. Electronically Signed   By: Narda Rutherford M.D.   On: 09/30/2022 17:15   DG Lumbar Spine Complete  Result Date: 09/30/2022 CLINICAL DATA:  Status post fall with pain. EXAM: LUMBAR SPINE - COMPLETE 4+ VIEW COMPARISON:  Lumbar radiograph 09/24/2016 FINDINGS: Four non-rib-bearing lumbar vertebra, the lower most lumbar  vertebra will be labeled L5-S1 prior radiograph. Mild levo scoliotic curvature, unchanged. Posterior rod with intrapedicular screw fusion and interbody spacer at L4-L5. Chronic grade 1 anterolisthesis of L5 on S1. Minimal undulation of superior L2 endplate is new from prior exam, possible minor superior endplate compression deformity. Degenerative disc disease and facet hypertrophy at L3-L4. The bones are subjectively under mineralized. Aortic atherosclerosis. IMPRESSION: 1. Minimal undulation of superior L2 endplate is new from prior exam, possible minor superior endplate compression deformity. Recommend correlation for point tenderness. 2. Otherwise unchanged appearance of the lumbar spine with scoliosis, postsurgical and degenerative change. L4-L5 hardware is intact. Electronically Signed   By: Narda Rutherford M.D.   On: 09/30/2022 17:14   DG Hip Unilat  With Pelvis 2-3 Views Right  Result Date: 09/30/2022 CLINICAL DATA:  Fall with hip pain. EXAM: DG HIP (WITH OR WITHOUT PELVIS) 2-3V RIGHT COMPARISON:  None Available. FINDINGS: There is no evidence of hip fracture or dislocation. Intact bony pelvis including pubic rami. Pubic symphysis and sacroiliac joints are congruent, both with mild  degenerative change. Mild degenerative change of the hips. IMPRESSION: No fracture of the pelvis or right hip. Electronically Signed   By: Narda Rutherford M.D.   On: 09/30/2022 17:11       LOS: 0 days    Rito Ehrlich  [facility] Pager on [email or web address]  10/02/2022, 9:40 AM

## 2022-10-02 NOTE — Care Management Obs Status (Signed)
MEDICARE OBSERVATION STATUS NOTIFICATION   Patient Details  Name: Stacy Kirk MRN: [id number] Date of Birth: 03-18-1946   Medicare Observation Status Notification Given:  Yes    Ronny Bacon, RN 10/02/2022, 8:23 AM

## 2022-10-02 NOTE — TOC Initial Note (Signed)
Transition of Care [facility]) - Initial/Assessment Note    Patient Details  Name: Stacy Kirk MRN: [id number] Date of Birth: 06-06-1945  Transition of Care [facility]) CM/SW Contact:    Delilah Shan, LCSWA Phone Number: 10/02/2022, 10:35 AM  Clinical Narrative:                    CSW received consult for possible SNF placement at time of discharge. CSW spoke with patient regarding PT recommendation of SNF placement at time of discharge. Patient reports PTA she comes from home with spouse and son and would like to return home. Patient reports she was just about to take a nap and is having a hard time comprehending patient request for CSW to follow back up regarding discussion of PT recs./dc plan.CSW informed patient that TOC will follow back up with her regarding dc plan. Patient thanked CSW.  No further questions reported at this time. CSW to continue to follow and assist with discharge planning needs.      Patient Goals and CMS Choice            Expected Discharge Plan and Services                                              Prior Living Arrangements/Services                       Activities of Daily Living Home Assistive Devices/Equipment: Cane (specify quad or straight) ADL Screening (condition at time of admission) Patient's cognitive ability adequate to safely complete daily activities?: Yes Is the patient deaf or have difficulty hearing?: Yes Does the patient have difficulty seeing, even when wearing glasses/contacts?: No Does the patient have difficulty concentrating, remembering, or making decisions?: No Patient able to express need for assistance with ADLs?: Yes Does the patient have difficulty dressing or bathing?: No Independently performs ADLs?: Yes (appropriate for developmental age) Does the patient have difficulty walking or climbing stairs?: Yes Weakness of Legs: Both Weakness of Arms/Hands: None  Permission Sought/Granted                   Emotional Assessment              Admission diagnosis:  Closed compression fracture of L2 lumbar vertebra, initial encounter (HCC) [S32.020A] Closed fracture of second lumbar vertebra, unspecified fracture morphology, initial encounter (HCC) [S32.029A] Patient Active Problem List   Diagnosis Date Noted   Closed compression fracture of L2 lumbar vertebra, initial encounter (HCC) 09/30/2022   Hypokalemia 09/30/2022   Hypertension 09/30/2022   Controlled type 2 diabetes mellitus with complication, without long-term current use of insulin (HCC) 08/05/2022   Chronic constipation 08/05/2022   OAB (overactive bladder) 08/05/2022   Hypothyroidism 08/05/2022   Fatigue 08/05/2022   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-L5 level 09/07/2016   Abdominal pain, RLQ 01/24/2011   Abdominal right lower quadrant swelling 01/24/2011   Low back pain 01/24/2011   Knee pain, right 01/24/2011   PCP:  Eden Emms, NP Pharmacy:   [facility] [phone] - [address] [place] - [address] [place] [address] Phone: [phone] Fax: [phone]     Social Determinants of Health (SDOH) Social History: SDOH Screenings   Food Insecurity: No Food Insecurity (10/01/2022)  Housing: Low Risk  (10/01/2022)  Transportation Needs: No Transportation Needs (10/01/2022)  Utilities: Not At Risk (10/01/2022)  Tobacco Use: Medium Risk (09/30/2022)   SDOH Interventions:     Readmission Risk Interventions     No data to display

## 2022-10-02 NOTE — Plan of Care (Signed)
  Problem: Activity: Goal: Risk for activity intolerance will decrease Outcome: Not Progressing   Problem: Coping: Goal: Level of anxiety will decrease Outcome: Not Progressing   Problem: Pain Managment: Goal: General experience of comfort will improve Outcome: Not Progressing   Problem: Safety: Goal: Ability to remain free from injury will improve Outcome: Not Progressing   

## 2022-10-02 NOTE — Progress Notes (Signed)
Patient only peed 150 mls, bladder scan performed and noted 188 mls, patient stated "how can I pee when I don't drink much, nothing tastes good here".

## 2022-10-03 ENCOUNTER — Ambulatory Visit: Payer: Medicare HMO | Admitting: Internal Medicine

## 2022-10-03 ENCOUNTER — Inpatient Hospital Stay (HOSPITAL_COMMUNITY): Payer: Medicare HMO

## 2022-10-03 DIAGNOSIS — R1084 Generalized abdominal pain: Secondary | ICD-10-CM

## 2022-10-03 DIAGNOSIS — S32020A Wedge compression fracture of second lumbar vertebra, initial encounter for closed fracture: Secondary | ICD-10-CM | POA: Diagnosis not present

## 2022-10-03 DIAGNOSIS — I10 Essential (primary) hypertension: Secondary | ICD-10-CM | POA: Diagnosis not present

## 2022-10-03 LAB — GLUCOSE, CAPILLARY
Glucose-Capillary: 112 mg/dL — ABNORMAL HIGH (ref 70–99)
Glucose-Capillary: 105 mg/dL — ABNORMAL HIGH (ref 70–99)
Glucose-Capillary: 124 mg/dL — ABNORMAL HIGH (ref 70–99)
Glucose-Capillary: 103 mg/dL — ABNORMAL HIGH (ref 70–99)

## 2022-10-03 LAB — URINALYSIS, ROUTINE W REFLEX MICROSCOPIC
Bilirubin Urine: NEGATIVE
Glucose, UA: NEGATIVE mg/dL
Hgb urine dipstick: NEGATIVE
Ketones, ur: 20 mg/dL — AB
Nitrite: NEGATIVE
Protein, ur: NEGATIVE mg/dL
Specific Gravity, Urine: 1.012 (ref 1.005–1.030)
pH: 5 (ref 5.0–8.0)

## 2022-10-03 LAB — CBC
HCT: 46 % (ref 36.0–46.0)
Hemoglobin: 15.3 g/dL — ABNORMAL HIGH (ref 12.0–15.0)
MCH: 30.4 pg (ref 26.0–34.0)
MCHC: 33.3 g/dL (ref 30.0–36.0)
MCV: 91.3 fL (ref 80.0–100.0)
Platelets: 239 10*3/uL (ref 150–400)
RBC: 5.04 MIL/uL (ref 3.87–5.11)
RDW: 13.7 % (ref 11.5–15.5)
WBC: 9.4 10*3/uL (ref 4.0–10.5)
nRBC: 0 % (ref 0.0–0.2)

## 2022-10-03 LAB — BASIC METABOLIC PANEL
Anion gap: 9 (ref 5–15)
BUN: 16 mg/dL (ref 8–23)
CO2: 27 mmol/L (ref 22–32)
Calcium: 9.1 mg/dL (ref 8.9–10.3)
Chloride: 99 mmol/L (ref 98–111)
Creatinine, Ser: 0.94 mg/dL (ref 0.44–1.00)
GFR, Estimated: >60 mL/min (ref >60)
Glucose, Bld: 98 mg/dL (ref 70–99)
Potassium: 4 mmol/L (ref 3.5–5.1)
Sodium: 135 mmol/L (ref 135–145)

## 2022-10-03 MED ORDER — ENOXAPARIN SODIUM 80 MG/0.8ML IJ SOSY
80.0000 mg | PREFILLED_SYRINGE | Freq: Two times a day (BID) | INTRAMUSCULAR | Status: DC
  Administered 2022-10-03 – 2022-10-05 (×4): 80 mg via SUBCUTANEOUS
  Filled 2022-10-03 (×6): qty 0.8

## 2022-10-03 MED ORDER — IOHEXOL 9 MG/ML PO SOLN: 500.0000 mL | ORAL | Status: AC

## 2022-10-03 MED ORDER — ADULT MULTIVITAMIN W/MINERALS CH
1.0000 | ORAL_TABLET | Freq: Every day | ORAL | Status: DC
  Administered 2022-10-04 – 2022-10-10 (×7): 1 via ORAL
  Filled 2022-10-03 (×7): qty 1

## 2022-10-03 MED ORDER — IOHEXOL 350 MG/ML SOLN
75.0000 mL | Freq: Once | INTRAVENOUS | Status: AC | PRN
  Administered 2022-10-03: 75 mL via INTRAVENOUS

## 2022-10-03 MED ORDER — HYDRALAZINE HCL 20 MG/ML IJ SOLN
10.0000 mg | Freq: Four times a day (QID) | INTRAMUSCULAR | Status: DC | PRN
  Administered 2022-10-03 (×2): 10 mg via INTRAVENOUS
  Filled 2022-10-03 (×2): qty 1

## 2022-10-03 MED ORDER — SENNOSIDES-DOCUSATE SODIUM 8.6-50 MG PO TABS
2.0000 | ORAL_TABLET | Freq: Two times a day (BID) | ORAL | Status: DC
  Administered 2022-10-04 – 2022-10-09 (×10): 2 via ORAL
  Filled 2022-10-03 (×13): qty 2

## 2022-10-03 MED ORDER — BISACODYL 10 MG RE SUPP
10.0000 mg | Freq: Every day | RECTAL | Status: DC | PRN
  Filled 2022-10-03: qty 1

## 2022-10-03 MED ORDER — POLYETHYLENE GLYCOL 3350 17 G PO PACK
17.0000 g | PACK | Freq: Two times a day (BID) | ORAL | Status: DC
  Administered 2022-10-04 – 2022-10-08 (×10): 17 g via ORAL
  Filled 2022-10-03 (×12): qty 1

## 2022-10-03 MED ORDER — SODIUM CHLORIDE 0.9 % IV SOLN
1.0000 g | INTRAVENOUS | Status: AC
  Administered 2022-10-03 – 2022-10-06 (×4): 1 g via INTRAVENOUS
  Filled 2022-10-03 (×4): qty 10

## 2022-10-03 NOTE — Progress Notes (Signed)
Physical Therapy Treatment Patient Details Name: Stacy Kirk MRN: [id number] DOB: [birth year] Today's Date: 10/03/2022   History of Present Illness The pt is a 77 yo female presenting 7/5 after a fall at home. Work up revealed acute L2 compression fx. PMH includes: obesity, arthritis, cancer, DM II, HTN, and hypothyroidism.    PT Comments  The pt was OOB with RN and NT, then agreeable to PT session. She continues to need significant assist of 2 to rise to standing with use of RW, demos poor power in BLE and poor proprioception in regards to L foot placement and stability in stance. The pt was able to complete repeated sit-stand transfers with modA and use of stedy, but is limited in mobility progression by nausea, pain, and pt declining. Recommendations remain appropriate, pt does not have assist needed to return home in her current condition, would require 2 people to mobilize.      Assistance Recommended at Discharge Frequent or constant Supervision/Assistance  If plan is discharge home, recommend the following:  Can travel by private vehicle    Two people to help with walking and/or transfers;A lot of help with bathing/dressing/bathroom;Assistance with cooking/housework;Assist for transportation;Help with stairs or ramp for entrance;Direct supervision/assist for medications management   No  Equipment Recommendations  Rolling walker (2 wheels);BSC/3in1    Recommendations for Other Services       Precautions / Restrictions Precautions Precautions: Fall;Back Precaution Booklet Issued: No Precaution Comments: discussed verbally for comfort, pt with poor adherance and memory Required Braces or Orthoses: Spinal Brace Spinal Brace: Thoracolumbosacral orthotic;Applied in sitting position (needs larger size) Restrictions Weight Bearing Restrictions: No     Mobility  Bed Mobility Overal bed mobility: Needs Assistance Bed Mobility: Sit to Sidelying, Rolling Rolling: Min assist        Sit to sidelying: Max assist General bed mobility comments: pt with little attempt to assist on return to bed until movement initiated by PT    Transfers Overall transfer level: Needs assistance Equipment used: Rolling walker (2 wheels), Ambulation equipment used Transfers: Sit to/from Stand, Bed to chair/wheelchair/BSC Sit to Stand: Mod assist, +2 physical assistance           General transfer comment: pt completing sit-stand with modA of 2 from [facility], able to stand for cleaning but then unable to step to chair. pt then using stedy for safe pivot from [facility] to bed. pt completed x2 sit-stand from stedy with modA of 1 Transfer via Lift Equipment: Stedy  Ambulation/Gait               General Gait Details: deferred due to poor placement of L ankle on the ground and pt inattention/poor proprioception. then used stedy for safe transfer to bed      Balance Overall balance assessment: Needs assistance Sitting-balance support: Bilateral upper extremity supported, Feet supported Sitting balance-Leahy Scale: Poor   Postural control: Posterior lean, Right lateral lean Standing balance support: Bilateral upper extremity supported, During functional activity, Reliant on assistive device for balance Standing balance-Leahy Scale: Poor Standing balance comment: dependent on external assist and BUE support                            Cognition Arousal/Alertness: Awake/alert Behavior During Therapy: WFL for tasks assessed/performed, Impulsive Overall Cognitive Status: No family/caregiver present to determine baseline cognitive functioning Area of Impairment: Orientation, Memory, Safety/judgement, Awareness, Problem solving, Following commands  Orientation Level: Disoriented to, Time   Memory: Decreased recall of precautions, Decreased short-term memory Following Commands: Follows one step commands inconsistently (possibly due to [facility] but pt follows  some commands and not others despite no change in PT volume) Safety/Judgement: Decreased awareness of safety Awareness: Intellectual Problem Solving: Slow processing, Decreased initiation, Difficulty sequencing, Requires verbal cues General Comments: pt very distracted, not followignn commands or instructions well today. Pt states it is due to [facility], but she is able to follow ~50% of commands all given with same volume, question HOH vs attention vs personality? pt with poor self-awareness and minimal initiation        Exercises      General Comments General comments (skin integrity, edema, etc.): VSS on RA, pt c/o dizziness and nausea      Pertinent Vitals/Pain Pain Assessment Pain Assessment: Faces Faces Pain Scale: Hurts even more Pain Location: R hip/low back Pain Descriptors / Indicators: Discomfort, Grimacing, Moaning, Sharp Pain Intervention(s): Monitored during session, Repositioned     PT Goals (current goals can now be found in the care plan section) Acute Rehab PT Goals Patient Stated Goal: reduce pain PT Goal Formulation: With patient Time For Goal Achievement: 10/15/22 Potential to Achieve Goals: Good Progress towards PT goals: Progressing toward goals    Frequency    Min 3X/week      PT Plan Current plan remains appropriate       AM-PAC PT "6 Clicks" Mobility   Outcome Measure  Help needed turning from your back to your side while in a flat bed without using bedrails?: A Little Help needed moving from lying on your back to sitting on the side of a flat bed without using bedrails?: A Lot Help needed moving to and from a bed to a chair (including a wheelchair)?: Total Help needed standing up from a chair using your arms (e.g., wheelchair or bedside chair)?: Total Help needed to walk in hospital room?: Total (<20 ft) Help needed climbing 3-5 steps with a railing? : Total 6 Click Score: 9    End of Session Equipment Utilized During Treatment: Gait  belt Activity Tolerance: Patient tolerated treatment well;Patient limited by pain Patient left: in bed;with call bell/phone within reach;with bed alarm set Nurse Communication: Mobility status PT Visit Diagnosis: Unsteadiness on feet (R26.81);Muscle weakness (generalized) (M62.81);Other abnormalities of gait and mobility (R26.89);Pain Pain - Right/Left: Right Pain - part of body:  (back)     Time: 1241-1301 PT Time Calculation (min) (ACUTE ONLY): 20 min  Charges:    $Therapeutic Exercise: 8-22 mins PT General Charges $$ ACUTE PT VISIT: 1 Visit                     Vickki Muff, PT, DPT   Acute Rehabilitation Department Office [phone] Secure Chat Communication Preferred   Ronnie Derby 10/03/2022, 2:02 PM

## 2022-10-03 NOTE — Progress Notes (Signed)
ANTICOAGULATION CONSULT NOTE - Initial Consult  Pharmacy Consult for Lovenox Indication: subsegmental pulmonary embolus  Allergies  Allergen Reactions   Erythromycin Other (See Comments)    "tears" her stomach up. Major digestive upset.   Tramadol Shortness Of Breath and Other (See Comments)    Felt like lungs filled up; unable to lay down   Albumin (Human) Other (See Comments)    Pt unsure of reaction   Aspirin Itching and Other (See Comments)    Cause lethargy   Codeine Itching    Cause lethargy   Cortisone Other (See Comments)    NO STEROID INJECTIONS; headaches   Propoxyphene Other (See Comments)    Terrible reaction; headaches   Simvastatin Itching and Other (See Comments)    Severe muscle aches   Triamcinolone Acetonide Other (See Comments)    headache    Patient Measurements: Height: 5' (152.4 cm) Weight: 84.3 kg (185 lb 13.6 oz) IBW/kg (Calculated) : 45.5  Vital Signs: Temp: 98.9 F (37.2 C) (07/08 1525) Temp Source: Oral (07/08 1525) BP: 160/84 (07/08 1525) Pulse Rate: 51 (07/08 1525)  Labs: Recent Labs    10/01/22 0131 10/03/22 0420  HGB 14.8 15.3*  HCT 45.1 46.0  PLT 221 239  CREATININE 1.15* 0.94    Estimated Creatinine Clearance: 46.7 mL/min (by C-G formula based on SCr of 0.94 mg/dL).   Medical History: Past Medical History:  Diagnosis Date   Arthritis    Cancer (HCC)    ovarian, skin face- skin cancer   Complication of anesthesia    slow to awaken everday, even slower with anesthesia   Constipation    Diabetes mellitus    Type II   GERD (gastroesophageal reflux disease)    Headache 05/12/2015   "after a fall" headache for 8 months   Hemorrhoids    Hernia    History of kidney stones    passed   Hypertension    Hypothyroidism    Loss of appetite    Muscle spasm    back   Osteoporosis    Redness    abd wound   Urine frequency    unable to hold urine    Wears hearing aid    bilateral     Medications:  Scheduled:    diltiazem  120 mg Oral Daily   feeding supplement  237 mL Oral BID BM   insulin aspart  0-15 Units Subcutaneous TID WC   levothyroxine  100 mcg Oral Q0600   LORazepam  0.5 mg Intravenous Once   multivitamin with minerals  1 tablet Oral Daily   polyethylene glycol  17 g Oral BID   predniSONE  10 mg Oral Q breakfast   senna-docusate  2 tablet Oral BID   Infusions:   cefTRIAXone (ROCEPHIN)  IV 1 g (10/03/22 1638)    Assessment: 77 y.o. F found to have subsegmental PE ijn RLL on CT abd/pelvis. To begin Lovenox. Pt was on Lovenox 40mg  for VTE prophylaxis - last dose 7/7 2109. CBC stable.  Goal of Therapy:  Anti-Xa level 0.6-1 units/ml 4hrs after LMWH dose given Monitor platelets by anticoagulation protocol: Yes   Plan:  Lovenox 80 mg SQ q12h CBC q72h while pt on Lovenox Will f/u ability to transition to DOAC  Christoper Fabian, PharmD, BCPS Please see amion for complete clinical pharmacist phone list 10/03/2022,7:16 PM

## 2022-10-03 NOTE — TOC Progression Note (Addendum)
Transition of Care [facility]) - Progression Note    Patient Details  Name: Stacy Kirk MRN: [id number] Date of Birth: 08/18/1945  Transition [facility]) CM/SW Contact  Epifanio Lesches, RN Phone Number: 10/03/2022, 1:08 PM  Clinical Narrative:        - Acute L2 compression fracture  NCM spoke with pt regarding disposition need for SNF/ rehab. placement. Currently requiring to person max assist. Husband states his assistance is limited. Currently caring for 47 y/o disabled son. Pt continues to decline SNF placement. Agreeable to home health services. Pt without provider preference. Referral made with [facility] and accepted pending MD orders/ F2F. NCM noted recommended DME need: Rolling walker (2 wheels);BSC/3in1. TOC team to f/u with DME needs closer to d/c day.  TOC monitoring and will continue assisting with needs....   Expected Discharge Plan: Home w Home Health Services (declining SNF placement) Barriers to Discharge: Continued Medical Work up  Expected Discharge Plan and Services                                   HH Arranged: PT, OT [facility] Agency: [facility] Date [facility] Agency Contacted: 10/03/22 Time HH Agency Contacted: [phone] Representative spoke with at [facility] Agency: [place]   Social Determinants of Health (SDOH) Interventions SDOH Screenings   Food Insecurity: No Food Insecurity (10/01/2022)  Housing: Low Risk  (10/01/2022)  Transportation Needs: No Transportation Needs (10/01/2022)  Utilities: Not At Risk (10/01/2022)  Tobacco Use: Medium Risk (09/30/2022)    Readmission Risk Interventions     No data to display

## 2022-10-03 NOTE — Progress Notes (Signed)
Initial Nutrition Assessment  DOCUMENTATION CODES:   Obesity unspecified  INTERVENTION:  Liberalize diet to regular to widen available options to promote adequate nutritional intake Continue Ensure Enlive po BID, each supplement provides 350 kcal and 20 grams of protein. Magic cup TID with meals, each supplement provides 290 kcal and 9 grams of protein MVI with minerals daily  NUTRITION DIAGNOSIS:   Inadequate oral intake related to acute illness, nausea as evidenced by meal completion < 50%, per patient/family report.  GOAL:   Patient will meet greater than or equal to 90% of their needs  MONITOR:   PO intake, Supplement acceptance, Labs, Weight trends, Diet advancement  REASON FOR ASSESSMENT:   Malnutrition Screening Tool    ASSESSMENT:   Pt admitted after a fall at home. Found to have acute L2 superior endplate compression fracture with at least 20% vertebral body height loss. PMH significant for T2DM, HTN, hypothyroidism, lumbar stenosis s/p L4-5 interbody fusion (2018).  No family present at bedside. Pt only opened eyes, looked at RD and would intermittently nod to answers and responds "a little bit better" when asked how she is feeling and then closed eyes. Pt nods yes to nausea today. Unable to obtain detailed nutrition related history.   Per MD note, pt's husband reports pt has experienced nausea and vomiting within the last 2-3 weeks. CT abdomen/pelvis pending.   Spoke with pt's nurse tech who reports pt has stated dislike of hospital food. She may take a couple bites if anything. She will consume nutrition supplements but not consistently. Observed breakfast tray on bedside table untouched.   No updated weight on file. Admit weight 84.3 kg.  Prior weight history is limited. Will continue to monitor trends throughout admission.   Medications: SSI 0-15 units TID, MVI, miralax, prednisone, senna, PRN zofran  Labs: HgbA1c 6.6% (05/10), CBG's 102-163 x24  hours  NUTRITION - FOCUSED PHYSICAL EXAM:  Flowsheet Row Most Recent Value  Orbital Region No depletion  Upper Arm Region No depletion  Thoracic and Lumbar Region No depletion  Buccal Region No depletion  Temple Region Mild depletion  Clavicle Bone Region No depletion  Clavicle and Acromion Bone Region No depletion  Scapular Bone Region No depletion  Dorsal Hand No depletion  Patellar Region No depletion  Anterior Thigh Region No depletion  Posterior Calf Region No depletion  Edema (RD Assessment) Mild  [BLE]  Hair Reviewed  Eyes Reviewed  Mouth Unable to assess  Skin Reviewed  Nails Reviewed       Diet Order:   Diet Order             Diet regular Room service appropriate? Yes with Assist; Fluid consistency: Thin  Diet effective now                   EDUCATION NEEDS:   Not appropriate for education at this time  Skin:  Skin Assessment: Reviewed RN Assessment  Last BM:  unknown  Height:   Ht Readings from Last 1 Encounters:  09/30/22 5' (1.524 m)    Weight:   Wt Readings from Last 1 Encounters:  09/30/22 84.3 kg    Ideal Body Weight:  45.5 kg  BMI:  Body mass index is 36.3 kg/m.  Estimated Nutritional Needs:   Kcal:  1400-1600  Protein:  65-80g  Fluid:  >/=1.5L  Drusilla Kanner, RDN, LDN Clinical Nutrition

## 2022-10-03 NOTE — Progress Notes (Signed)
PT Cancellation Note  Patient Details Name: Stacy Kirk MRN: [id number] DOB: 1945-10-10   Cancelled Treatment:    Reason Eval/Treat Not Completed: Patient declined, no reason specified, pt mostly maintaining eyes closed and not responding to therapist questions or prompts. Pt states she is in too much pain with any movement and too nauseous to mobilize. Pt refusing to take pain medications at this time due to nausea (pt has already been given medicines for nausea and reports other attempts such as alcohol pad increase her nausea). Other than voicing these complaints, pt would not open eyes or acknowledge therapist. Will attempt to return for continued mobility as time/schedule allow.   Vickki Muff, PT, DPT   Acute Rehabilitation Department Office [phone] Secure Chat Communication Preferred   Ronnie Derby 10/03/2022, 9:43 AM

## 2022-10-03 NOTE — Plan of Care (Signed)
  Problem: Coping: Goal: Level of anxiety will decrease Outcome: Not Progressing   Problem: Pain Managment: Goal: General experience of comfort will improve Outcome: Not Progressing   Problem: Safety: Goal: Ability to remain free from injury will improve Outcome: Not Progressing   

## 2022-10-03 NOTE — Plan of Care (Signed)

## 2022-10-03 NOTE — Progress Notes (Signed)
[facility] PROGRESS NOTE   Stacy Kirk DOB: 1945/04/15 DOA: 09/30/2022  PCP: Eden Emms, NP  Brief History/Interval Summary: 77 y.o. female with medical history significant for T2DM, HTN, hypothyroidism, lumbar stenosis s/p L4-5 interbody fusion 2018, obesity who presented to the ED for evaluation after a fall at home. Patient states that she normally ambulates with a cane.  Her husband was trying to help her off the couch earlier today when she slipped and fell.  She landed on her bottom.  She developed significant posterior right hip pain.  She was unable to stand on her own.  EMS were called and she was brought to the ED for further evaluation.  She did not hit her head or lose consciousness.  She says pain is tolerable after she received IV morphine however still has not been able to stand with assistance.  Evaluation reviewed L2 compression fracture.  She was hospitalized for further management.  Consultants: Phone discussion with neurosurgery  Procedures: None    Subjective/Interval History: Yesterday patient had an episode of agitation which appears to have resolved.  Noted to have high blood pressure this morning.  Patient complains of abdominal discomfort.  States that she wants to urinate.  Bladder scan has been ordered.     Assessment/Plan:  Acute L2 compression fracture Detected on CT scan.  No other injuries noted on multiple imaging studies done in the emergency department.  Case was discussed by ED PA with neurosurgery who recommended TLSO brace and physical therapy. This is also in the setting of previous L4-L5 spinal fusion surgery.  Patient initially gave the impression that the surgery occurred a long time ago but it appears that she had surgery performed by Dr. Wynetta Emery in 2018. Seen by physical therapy who recommended SNF but patient not very keen on this.  Continue current management for now.  Continue with physical therapy in the hospital.   Hopefully she can improve enough to be able to go home with home health. Continue with TLSO brace when she is up and about.  Pain control.  Transient altered mental status Had an episode of agitation yesterday.  Ativan was ordered but was never given.  Somewhat distracted this morning.  Wonder if she has underlying cognitive impairment.  No obvious focal neurological deficits noted.  Abdominal discomfort Could be due to urinary retention.  Bladder scan ordered.  Discussed with nursing staff.  Will continue to monitor. Discussed with patient's husband who mentioned that patient has been having some nausea and vomiting for the past 2 to 3 weeks.  In view of this send new history we will go ahead and do a CT scan of her abdomen and pelvis.  Her LFTs were normal few days ago.  Lipase was normal a few days ago.  Will also check a UA.  Constipation Bowel regimen initiated.  Hypokalemia Repleted.  Diabetes mellitus type 2 On SSI.  Essential hypertension Noted to be on diltiazem.  Elevated blood pressure reading noted this morning.  Could be secondary to pain issues.  Hydralazine as needed.  May need adjustment of diltiazem dose.  Hypothyroidism Continue with levothyroxine.  Chronic steroid use Based on medication reconciliation as well as notes from her PCP as recently as May 2024 she is on prednisone 10 mg daily.  This was reinitiated.  Obesity Estimated body mass index is 36.3 kg/m as calculated from the following:   Height as of this encounter: 5' (1.524 m).   Weight as of  this encounter: 84.3 kg.   DVT Prophylaxis: Lovenox Code Status: Full code Family Communication: Discussed with patient.  Disposition Plan: SNF versus home with home health.      Medications: Scheduled:  diltiazem  120 mg Oral Daily   enoxaparin (LOVENOX) injection  40 mg Subcutaneous Q24H   feeding supplement  237 mL Oral BID BM   insulin aspart  0-15 Units Subcutaneous TID WC   levothyroxine  100 mcg  Oral Q0600   LORazepam  0.5 mg Intravenous Once   predniSONE  10 mg Oral Q breakfast   Continuous: [id number] **OR** acetaminophen, baclofen, bisacodyl, hydrALAZINE, HYDROcodone-acetaminophen, morphine injection, ondansetron **OR** ondansetron (ZOFRAN) IV, senna-docusate  Antibiotics: Anti-infectives (From admission, onward)    None       Objective:  Vital Signs  Vitals:   10/02/22 1923 10/03/22 0410 10/03/22 0422 10/03/22 0732  BP: (!) 166/94 (!) 164/86  (!) 200/116  Pulse: 97 90  60  Resp: 17 17    Temp: 99.3 F (37.4 C)  98.4 F (36.9 C) 99.6 F (37.6 C)  TempSrc: Oral   Oral  SpO2: 95% 93%  96%  Weight:      Height:        Intake/Output Summary (Last 24 hours) at 10/03/2022 1024 Last data filed at 10/03/2022 0505 Gross per 24 hour  Intake --  Output 800 ml  Net -800 ml    Filed Weights   09/30/22 2352  Weight: 84.3 kg    General appearance: Awake alert.  In no distress Resp: Clear to auscultation bilaterally.  Normal effort Cardio: S1-S2 is normal regular.  No S3-S4.  No rubs murmurs or bruit GI: Abdomen is soft.  Tender in the lower abdomen.  No rebound rigidity or guarding.  No masses organomegaly. Extremities: No edema.  Full range of motion of lower extremities. Neurologic: Alert and oriented x3.  No focal neurological deficits.      Lab Results:  Data Reviewed: I have personally reviewed following labs and reports of the imaging studies  CBC: Recent Labs  Lab 09/30/22 1559 10/01/22 0131 10/03/22 0420  WBC 13.3* 12.1* 9.4  HGB 14.8 14.8 15.3*  HCT 45.3 45.1 46.0  MCV 92.6 92.0 91.3  PLT 233 221 239     Basic Metabolic Panel: Recent Labs  Lab 09/30/22 1559 10/01/22 0131 10/03/22 0420  NA 140 136 135  K 3.0* 4.4 4.0  CL 99 100 99  CO2 26 28 27   GLUCOSE 143* 113* 98  BUN 28* 26* 16  CREATININE 1.13* 1.15* 0.94  CALCIUM 8.2* 8.6* 9.1  MG 1.2*  --   --      GFR: Estimated Creatinine Clearance: 46.7 mL/min (by C-G  formula based on SCr of 0.94 mg/dL).  Liver Function Tests: Recent Labs  Lab 09/30/22 1559  AST 19  ALT 17  ALKPHOS 44  BILITOT 1.3*  PROT 5.7*  ALBUMIN 3.2*     Recent Labs  Lab 09/30/22 1559  LIPASE 40     CBG: Recent Labs  Lab 10/02/22 0935 10/02/22 1127 10/02/22 1624 10/02/22 2041 10/03/22 0724  GLUCAP 131* 112* 163* 102* 112*       Radiology Studies: No results found.     LOS: 1 day    [employer] on [email or web address]  10/03/2022, 10:24 AM

## 2022-10-04 ENCOUNTER — Inpatient Hospital Stay (HOSPITAL_COMMUNITY): Payer: Medicare HMO

## 2022-10-04 DIAGNOSIS — I2699 Other pulmonary embolism without acute cor pulmonale: Secondary | ICD-10-CM

## 2022-10-04 DIAGNOSIS — S32020A Wedge compression fracture of second lumbar vertebra, initial encounter for closed fracture: Secondary | ICD-10-CM | POA: Diagnosis not present

## 2022-10-04 DIAGNOSIS — I10 Essential (primary) hypertension: Secondary | ICD-10-CM | POA: Diagnosis not present

## 2022-10-04 DIAGNOSIS — M7989 Other specified soft tissue disorders: Secondary | ICD-10-CM | POA: Diagnosis not present

## 2022-10-04 DIAGNOSIS — I5022 Chronic systolic (congestive) heart failure: Secondary | ICD-10-CM | POA: Diagnosis not present

## 2022-10-04 DIAGNOSIS — I2693 Single subsegmental pulmonary embolism without acute cor pulmonale: Secondary | ICD-10-CM | POA: Diagnosis not present

## 2022-10-04 LAB — BASIC METABOLIC PANEL
Anion gap: 13 (ref 5–15)
BUN: 16 mg/dL (ref 8–23)
CO2: 25 mmol/L (ref 22–32)
Calcium: 9 mg/dL (ref 8.9–10.3)
Chloride: 97 mmol/L — ABNORMAL LOW (ref 98–111)
Creatinine, Ser: 1.09 mg/dL — ABNORMAL HIGH (ref 0.44–1.00)
GFR, Estimated: 52 mL/min — ABNORMAL LOW (ref >60)
Glucose, Bld: 82 mg/dL (ref 70–99)
Potassium: 3.7 mmol/L (ref 3.5–5.1)
Sodium: 135 mmol/L (ref 135–145)

## 2022-10-04 LAB — ECHOCARDIOGRAM COMPLETE
AR max vel: 1.13 cm2
AV Area VTI: 1.15 cm2
AV Area mean vel: 1.08 cm2
AV Mean grad: 12 mmHg
AV Peak grad: 21 mmHg
Ao pk vel: 2.29 m/s
Calc EF: 38.3 %
Height: 60 in
S' Lateral: 3.2 cm
Single Plane A2C EF: 33.3 %
Single Plane A4C EF: 40.7 %
Weight: 2973.56 oz

## 2022-10-04 LAB — GLUCOSE, CAPILLARY
Glucose-Capillary: 102 mg/dL — ABNORMAL HIGH (ref 70–99)
Glucose-Capillary: 137 mg/dL — ABNORMAL HIGH (ref 70–99)
Glucose-Capillary: 133 mg/dL — ABNORMAL HIGH (ref 70–99)
Glucose-Capillary: 138 mg/dL — ABNORMAL HIGH (ref 70–99)

## 2022-10-04 LAB — CBC
HCT: 45.8 % (ref 36.0–46.0)
Hemoglobin: 15.6 g/dL — ABNORMAL HIGH (ref 12.0–15.0)
MCH: 31.5 pg (ref 26.0–34.0)
MCHC: 34.1 g/dL (ref 30.0–36.0)
MCV: 92.3 fL (ref 80.0–100.0)
Platelets: 248 10*3/uL (ref 150–400)
RBC: 4.96 MIL/uL (ref 3.87–5.11)
RDW: 13.9 % (ref 11.5–15.5)
WBC: 9.3 10*3/uL (ref 4.0–10.5)
nRBC: 0 % (ref 0.0–0.2)

## 2022-10-04 MED ORDER — SODIUM CHLORIDE 0.45 % IV SOLN: INTRAVENOUS | Status: AC

## 2022-10-04 MED ORDER — IOHEXOL 350 MG/ML SOLN
75.0000 mL | Freq: Once | INTRAVENOUS | Status: AC | PRN
  Administered 2022-10-04: 75 mL via INTRAVENOUS

## 2022-10-04 NOTE — Plan of Care (Signed)
  Problem: Coping: Goal: Ability to adjust to condition or change in health will improve Outcome: Progressing   Problem: Nutritional: Goal: Maintenance of adequate nutrition will improve Outcome: Progressing   Problem: Skin Integrity: Goal: Risk for impaired skin integrity will decrease Outcome: Progressing   Problem: Education: Goal: Knowledge of General Education information will improve Description: Including pain rating scale, medication(s)/side effects and non-pharmacologic comfort measures Outcome: Progressing   Problem: Coping: Goal: Level of anxiety will decrease Outcome: Progressing   Problem: Pain Managment: Goal: General experience of comfort will improve Outcome: Progressing   Problem: Safety: Goal: Ability to remain free from injury will improve Outcome: Progressing

## 2022-10-04 NOTE — Progress Notes (Signed)
  Echocardiogram 2D Echocardiogram has been performed.  Delcie Roch 10/04/2022, 9:48 AM

## 2022-10-04 NOTE — Care Management Important Message (Signed)
Important Message  Patient Details  Name: MICKY RODDA MRN: [id number] Date of Birth: 1945/06/06   Medicare Important Message Given:  Yes     Sherilyn Banker 10/04/2022, 11:36 AM

## 2022-10-04 NOTE — Progress Notes (Signed)
Bilateral lower extremity venous study completed.   Preliminary results relayed to MD and RN.   Please see CV Procedures for preliminary results.   , RVT  2:20 PM 10/04/22

## 2022-10-04 NOTE — Progress Notes (Addendum)
[facility] PROGRESS NOTE   Stacy Kirk NWG:[id number] DOB: 31-Jan-1946 DOA: 09/30/2022  PCP: Eden Emms, NP  Brief History/Interval Summary: 77 y.o. female with medical history significant for T2DM, HTN, hypothyroidism, lumbar stenosis s/p L4-5 interbody fusion 2018, obesity who presented to the ED for evaluation after a fall at home. Patient states that she normally ambulates with a cane.  Her husband was trying to help her off the couch earlier today when she slipped and fell.  She landed on her bottom.  She developed significant posterior right hip pain.  She was unable to stand on her own.  EMS were called and she was brought to the ED for further evaluation.  She did not hit her head or lose consciousness.  She says pain is tolerable after she received IV morphine however still has not been able to stand with assistance.  Evaluation reviewed L2 compression fracture.  She was hospitalized for further management.  Consultants: Phone discussion with neurosurgery  Procedures: Echocardiogram.  Lower extremity venous Doppler studies pending    Subjective/Interval History: Patient is more comfortable this morning.  Denies any abdominal pain.  Back pain is reasonably well-controlled.  Denies any chest pain.  Surprised to hear that she has a blood clot in her lung.  She does not have a previous history of same.  Denies any history of heart disease.     Assessment/Plan:  Acute L2 compression fracture Detected on CT scan.  No other injuries noted on multiple imaging studies done in the emergency department.  Case was discussed by ED PA with neurosurgery who recommended TLSO brace and physical therapy. This is also in the setting of previous L4-L5 spinal fusion surgery.  Patient initially gave the impression that the surgery occurred a long time ago but it appears that she had surgery performed by Dr. Wynetta Emery in 2018. Seen by physical therapy who recommended SNF but patient not very  keen on this.  Continue current management for now.  Continue with physical therapy in the hospital.  Hopefully she can improve enough to be able to go home with home health. Continue with TLSO brace when she is up and about.  Pain control.  Acute pulmonary embolism Incidentally noted on CT abdomen and pelvis. No previous history of same.  She was started on therapeutic Lovenox.  Lower extremity Doppler studies pending. Will do a dedicated CT angiogram of her chest to evaluate for burden of the disease today.  Will order metabolic panel prior to ordering the CT angiogram to make sure her renal function is okay.  Echocardiogram does show a mildly reduced RV function.  Chronic systolic CHF Echocardiogram shows diminished EF of 40 to 45%.  Global hypokinesis noted.  Moderate LVH noted.  Moderate aortic valve stenosis noted. No previous echocardiogram in our system or in care everywhere. Seems to be fairly euvolemic at this time. Could consider initiating ACE inhibitor/ARB.  However her blood pressures are noted to be low this morning.  We will discontinue her diltiazem and monitor blood pressure trends. Since she is otherwise relatively stable further management can be pursued in the outpatient setting with ambulatory referral to cardiology. EKG done on 7/5 was reviewed.  Transient altered mental status Had an episode of agitation on 7/7 which appears to have resolved on its own.  No focal neurological deficits.    Urinary tract infection Patient had abdominal discomfort yesterday.  CT of the abdomen pelvis did not show any acute findings.  LFTs and  lipase were normal a few days ago.  UA was done which was noted to be abnormal.  Could be the reason for her discomfort.  Started on antibiotics.  Follow-up on cultures.  Constipation Bowel regimen initiated.  Hypokalemia Repleted.  Diabetes mellitus type 2 On SSI.  Essential hypertension Stop diltiazem.  See discussion under CHF above.     Hypothyroidism Continue with levothyroxine.  Chronic steroid use Based on medication reconciliation as well as notes from her PCP as recently as May 2024 she is on prednisone 10 mg daily.  This was reinitiated.  Obesity Estimated body mass index is 36.3 kg/m as calculated from the following:   Height as of this encounter: 5' (1.524 m).   Weight as of this encounter: 84.3 kg.   DVT Prophylaxis: Lovenox Code Status: Full code Family Communication: Discussed with patient.  Disposition Plan: SNF versus home with home health.      Medications: Scheduled:  diltiazem  120 mg Oral Daily   enoxaparin (LOVENOX) injection  80 mg Subcutaneous Q12H   feeding supplement  237 mL Oral BID BM   insulin aspart  0-15 Units Subcutaneous TID WC   levothyroxine  100 mcg Oral Q0600   LORazepam  0.5 mg Intravenous Once   multivitamin with minerals  1 tablet Oral Daily   polyethylene glycol  17 g Oral BID   predniSONE  10 mg Oral Q breakfast   senna-docusate  2 tablet Oral BID   Continuous:  cefTRIAXone (ROCEPHIN)  IV Stopped (10/03/22 1708)   EAV:[id number] **OR** acetaminophen, baclofen, bisacodyl, hydrALAZINE, HYDROcodone-acetaminophen, morphine injection, ondansetron **OR** ondansetron (ZOFRAN) IV  Antibiotics: Anti-infectives (From admission, onward)    Start     Dose/Rate Route Frequency Ordered Stop   10/03/22 1600  cefTRIAXone (ROCEPHIN) 1 g in sodium chloride 0.9 % 100 mL IVPB        1 g 200 mL/hr over 30 Minutes Intravenous Every 24 hours 10/03/22 1458         Objective:  Vital Signs  Vitals:   10/03/22 1525 10/03/22 1933 10/04/22 0536 10/04/22 0824  BP: (!) 160/84 (!) 135/112 103/83 110/87  Pulse: (!) 51 (!) 104 (!) 49 96  Resp:  20 18 18   Temp: 98.9 F (37.2 C) 98.5 F (36.9 C) 98.6 F (37 C) 98.2 F (36.8 C)  TempSrc: Oral   Oral  SpO2: 94% 98% 94% 94%  Weight:      Height:        Intake/Output Summary (Last 24 hours) at 10/04/2022 1113 Last data filed  at 10/04/2022 0946 Gross per 24 hour  Intake 248.69 ml  Output 250 ml  Net -1.31 ml    Filed Weights   09/30/22 2352  Weight: 84.3 kg    General appearance: Awake alert.  In no distress Resp: Clear to auscultation bilaterally.  Normal effort Cardio: S1-S2 is normal regular.  No S3-S4.  No rubs murmurs or bruit GI: Abdomen is soft.  Nontender nondistended.  Bowel sounds are present normal.  No masses organomegaly Extremities: No edema.  To move both of her lower extremities. No obvious focal neurological deficits.   Lab Results:  Data Reviewed: I have personally reviewed following labs and reports of the imaging studies  CBC: Recent Labs  Lab 09/30/22 1559 10/01/22 0131 10/03/22 0420 10/04/22 0638  WBC 13.3* 12.1* 9.4 9.3  HGB 14.8 14.8 15.3* 15.6*  HCT 45.3 45.1 46.0 45.8  MCV 92.6 92.0 91.3 92.3  PLT 233 221 239 248  Basic Metabolic Panel: Recent Labs  Lab 09/30/22 1559 10/01/22 0131 10/03/22 0420  NA 140 136 135  K 3.0* 4.4 4.0  CL 99 100 99  CO2 26 28 27   GLUCOSE 143* 113* 98  BUN 28* 26* 16  CREATININE 1.13* 1.15* 0.94  CALCIUM 8.2* 8.6* 9.1  MG 1.2*  --   --      GFR: Estimated Creatinine Clearance: 46.7 mL/min (by C-G formula based on SCr of 0.94 mg/dL).  Liver Function Tests: Recent Labs  Lab 09/30/22 1559  AST 19  ALT 17  ALKPHOS 44  BILITOT 1.3*  PROT 5.7*  ALBUMIN 3.2*     Recent Labs  Lab 09/30/22 1559  LIPASE 40     CBG: Recent Labs  Lab 10/03/22 0724 10/03/22 1134 10/03/22 1552 10/03/22 2050 10/04/22 0725  GLUCAP 112* 105* 124* 103* 102*       Radiology Studies: ECHOCARDIOGRAM COMPLETE  Result Date: 10/04/2022    ECHOCARDIOGRAM REPORT   Patient Name:   Stacy Kirk Date of Exam: 10/04/2022 Medical Rec #:  [id number]          Height:       60.0 in Accession #:    [id number]         Weight:       185.8 lb Date of Birth:  1946/01/10          BSA:          1.809 m Patient Age:    77 years           BP:            110/87 mmHg Patient Gender: F                  HR:           88 bpm. Exam Location:  Inpatient Procedure: 2D Echo, Cardiac Doppler and Color Doppler Indications:    pulmonary embolus  History:        Patient has no prior history of Echocardiogram examinations.                 Risk Factors:Diabetes and Hypertension.  Sonographer:    Delcie Roch RDCS Referring Phys: [id number] Osvaldo Shipper IMPRESSIONS  1. Left ventricular ejection fraction, by estimation, is 40 to 45%. The left ventricle has mildly decreased function. The left ventricle demonstrates global hypokinesis. There is moderate concentric left ventricular hypertrophy. Left ventricular diastolic parameters are consistent with Grade I diastolic dysfunction (impaired relaxation).  2. Right ventricular systolic function is mildly reduced. The right ventricular size is normal. Mildly increased right ventricular wall thickness. Tricuspid regurgitation signal is inadequate for assessing PA pressure.  3. Left atrial size was mildly dilated.  4. The mitral valve is degenerative. No evidence of mitral valve regurgitation. No evidence of mitral stenosis. Moderate mitral annular calcification.  5. The aortic valve is tricuspid. There is mild calcification of the aortic valve. There is moderate thickening of the aortic valve. Aortic valve regurgitation is not visualized. Moderate aortic valve stenosis.  6. There is borderline dilatation of the ascending aorta, measuring 38 mm.  7. The inferior vena cava is normal in size with greater than 50% respiratory variability, suggesting right atrial pressure of 3 mmHg. Conclusion(s)/Recommendation(s): Consider infiltrative cardiomyopathy (amyloidosis) if there clinical reasons to suspect this disorder. Moderate low flow-low gradient aortic stenosis. FINDINGS  Left Ventricle: Left ventricular ejection fraction, by estimation, is 40 to 45%. The left ventricle has mildly decreased function. The  left ventricle demonstrates global  hypokinesis. The left ventricular internal cavity size was normal in size. There is  moderate concentric left ventricular hypertrophy. Left ventricular diastolic parameters are consistent with Grade I diastolic dysfunction (impaired relaxation). Right Ventricle: The right ventricular size is normal. Mildly increased right ventricular wall thickness. Right ventricular systolic function is mildly reduced. Tricuspid regurgitation signal is inadequate for assessing PA pressure. Left Atrium: Left atrial size was mildly dilated. Right Atrium: Right atrial size was normal in size. Pericardium: There is no evidence of pericardial effusion. Presence of epicardial fat layer. Mitral Valve: The mitral valve is degenerative in appearance. There is mild thickening of the anterior and posterior mitral valve leaflet(s). Moderate mitral annular calcification. No evidence of mitral valve regurgitation. No evidence of mitral valve stenosis. Tricuspid Valve: The tricuspid valve is grossly normal. Tricuspid valve regurgitation is not demonstrated. Aortic Valve: The aortic valve is tricuspid. There is mild calcification of the aortic valve. There is moderate thickening of the aortic valve. Aortic valve regurgitation is not visualized. Moderate aortic stenosis is present. Aortic valve mean gradient measures 12.0 mmHg. Aortic valve peak gradient measures 21.0 mmHg. Aortic valve area, by VTI measures 1.15 cm. Pulmonic Valve: The pulmonic valve was normal in structure. Pulmonic valve regurgitation is not visualized. No evidence of pulmonic stenosis. Aorta: The aortic root is normal in size and structure. There is borderline dilatation of the ascending aorta, measuring 38 mm. Venous: The inferior vena cava is normal in size with greater than 50% respiratory variability, suggesting right atrial pressure of 3 mmHg. IAS/Shunts: No atrial level shunt detected by color flow Doppler.  LEFT VENTRICLE PLAX 2D LVIDd:         4.40 cm     Diastology  LVIDs:         3.20 cm     LV e' medial:    2.94 cm/s LV PW:         1.60 cm     LV E/e' medial:  11.9 LV IVS:        1.70 cm     LV e' lateral:   3.70 cm/s LVOT diam:     1.99 cm     LV E/e' lateral: 9.4 LV SV:         43 LV SV Index:   24 LVOT Area:     3.11 cm  LV Volumes (MOD) LV vol d, MOD A2C: 68.4 ml LV vol d, MOD A4C: 70.1 ml LV vol s, MOD A2C: 45.6 ml LV vol s, MOD A4C: 41.6 ml LV SV MOD A2C:     22.8 ml LV SV MOD A4C:     70.1 ml LV SV MOD BP:      27.9 ml RIGHT VENTRICLE            IVC RV S prime:     8.27 cm/s  IVC diam: 1.20 cm LEFT ATRIUM             Index        RIGHT ATRIUM           Index LA diam:        3.20 cm 1.77 cm/m   RA Area:     11.10 cm LA Vol (A2C):   51.6 ml 28.52 ml/m  RA Volume:   21.70 ml  11.99 ml/m LA Vol (A4C):   53.0 ml 29.29 ml/m LA Biplane Vol: 53.5 ml 29.57 ml/m  AORTIC VALVE AV Area (Vmax):    1.13  cm AV Area (Vmean):   1.08 cm AV Area (VTI):     1.15 cm AV Vmax:           229.00 cm/s AV Vmean:          157.000 cm/s AV VTI:            0.369 m AV Peak Grad:      21.0 mmHg AV Mean Grad:      12.0 mmHg LVOT Vmax:         83.05 cm/s LVOT Vmean:        54.700 cm/s LVOT VTI:          0.137 m LVOT/AV VTI ratio: 0.37  AORTA Ao Root diam: 3.60 cm Ao Asc diam:  3.80 cm MV E velocity: 34.96 cm/s MV A velocity: 85.81 cm/s  SHUNTS MV E/A ratio:  0.41        Systemic VTI:  0.14 m                            Systemic Diam: 1.99 cm Rachelle Hora Croitoru MD Electronically signed by Thurmon Fair MD Signature Date/Time: 10/04/2022/10:45:46 AM    Final    CT ABDOMEN PELVIS W CONTRAST  Result Date: 10/03/2022 CLINICAL DATA:  Acute abdominal pain EXAM: CT ABDOMEN AND PELVIS WITH CONTRAST TECHNIQUE: Multidetector CT imaging of the abdomen and pelvis was performed using the standard protocol following bolus administration of intravenous contrast. RADIATION DOSE REDUCTION: This exam was performed according to the departmental dose-optimization program which includes automated exposure control,  adjustment of the mA and/or kV according to patient size and/or use of iterative reconstruction technique. CONTRAST:  75mL OMNIPAQUE IOHEXOL 350 MG/ML SOLN COMPARISON:  Right hip and lumbar spine CT 09/30/2022 trauma remote abdominopelvic CT 11/10/2010 present since 2012 FINDINGS: Lower chest: Subsegmental pulmonary emboli in the right lower lobe. Dependent atelectasis in the lung bases. The heart is mildly enlarged. Hepatobiliary: Mild hepatic steatosis. The liver is prominent size spanning 17 cm cranial caudal. No focal liver abnormality. Clips in the gallbladder fossa postcholecystectomy. No biliary dilatation. Pancreas: No ductal dilatation or inflammation. Spleen: Normal in size.  No focal abnormality.  Incidental splenule. Adrenals/Urinary Tract: 2 cm right adrenal nodule is been present since 2012 exam consistent with benign adenoma, needs no further imaging follow-up. Normal left adrenal gland. No hydronephrosis or renal calculi. No suspicious renal abnormality. Moderate bladder distention without wall thickening. Stomach/Bowel: Small to moderate-sized hiatal hernia. The stomach is otherwise decompressed. No small bowel obstruction or inflammation. Colonic diverticulosis without diverticulitis. The appendix is not seen. Vascular/Lymphatic: Extensive aortic atherosclerosis. There is also irregular noncalcified plaque in the upper abdominal a descending thoracic aorta. No aortic inflammation. The portal vein is patent. Circumaortic left renal vein. No filling defects in the included femoral and iliac veins. No IVC thrombus. No adenopathy. Reproductive: Status post hysterectomy. No adnexal masses. Other: No free air or ascites. Postsurgical change of the anterior abdominal wall. Musculoskeletal: Stable appearance of mild L2 compression fracture. Posterior L4-L5 fusion hardware. Degenerative change in the included thoracic and lumbar spine. Degenerative change of the pubic symphysis. IMPRESSION: 1. Subsegmental  pulmonary emboli in the right lower lobe. 2. No acute abnormality in the abdomen/pelvis. 3. Colonic diverticulosis without diverticulitis. 4. Small to moderate-sized hiatal hernia. 5. Mild hepatic steatosis. 6. Advanced aortic and branch atherosclerosis. There is irregular noncalcified plaque in the lower thoracic and upper abdominal aorta. Critical Value/emergent results were called by telephone at  the time of interpretation on 10/03/2022 at 7:11 pm to provider [facility] , who verbally acknowledged these results. Electronically Signed   By: Narda Rutherford M.D.   On: 10/03/2022 19:11       LOS: 2 days    Rito Ehrlich  [facility] Pager on [email or web address]  10/04/2022, 11:13 AM

## 2022-10-04 NOTE — Plan of Care (Signed)
  Problem: Coping: Goal: Ability to adjust to condition or change in health will improve Outcome: Progressing   Problem: Health Behavior/Discharge Planning: Goal: Ability to manage health-related needs will improve Outcome: Progressing   Problem: Nutritional: Goal: Maintenance of adequate nutrition will improve Outcome: Progressing   Problem: Skin Integrity: Goal: Risk for impaired skin integrity will decrease Outcome: Progressing   Problem: Education: Goal: Knowledge of General Education information will improve Description: Including pain rating scale, medication(s)/side effects and non-pharmacologic comfort measures Outcome: Progressing   Problem: Coping: Goal: Level of anxiety will decrease Outcome: Progressing   Problem: Pain Managment: Goal: General experience of comfort will improve Outcome: Progressing   Problem: Safety: Goal: Ability to remain free from injury will improve Outcome: Progressing

## 2022-10-04 NOTE — Progress Notes (Signed)
Occupational Therapy Treatment Patient Details Name: Stacy Kirk MRN: [id number] DOB: 1946-02-12 Today's Date: 10/04/2022   History of present illness The pt is a 77 yo female presenting 7/5 after a fall at home. Work up revealed acute L2 compression fx. PMH includes: obesity, arthritis, cancer, DM II, HTN, and hypothyroidism.   OT comments  Pt in a lot of pain today, attempted to don back brace in room, Medium size is too small, requires larger brace. Pt max A for assist to EOB with HOB elevated, requires further instruction on log roll technique. Pt able to sit on EOB up to 10 minutes, L lateral lean. Attempted to stand using Stedy, unable to due to pain, assisted back to bed, max A for sit to supine. Pt stated she would talk to her husband about going to post acute rehab, educated on benefits of post acute rehab rather than going home. Pt would benefit greatly from continued skilled therapy to maximize function during acute stay, DC plan still appropriate.   Recommendations for follow up therapy are one component of a multi-disciplinary discharge planning process, led by the attending physician.  Recommendations may be updated based on patient status, additional functional criteria and insurance authorization.    Assistance Recommended at Discharge Frequent or constant Supervision/Assistance  Patient can return home with the following  A lot of help with walking and/or transfers;A lot of help with bathing/dressing/bathroom;Assistance with cooking/housework;Assist for transportation;Help with stairs or ramp for entrance   Equipment Recommendations  Other (comment) (defer)    Recommendations for Other Services      Precautions / Restrictions Precautions Precautions: Fall;Back Required Braces or Orthoses: Spinal Brace Spinal Brace: Thoracolumbosacral orthotic;Applied in sitting position Restrictions Weight Bearing Restrictions: No       Mobility Bed Mobility Overal bed  mobility: Needs Assistance Bed Mobility: Sidelying to Sit, Sit to Supine   Sidelying to sit: Mod assist, HOB elevated   Sit to supine: Max assist   General bed mobility comments: Pt in a lot of pain today, mod-max for bed mobility, dependent for scooting back in bed.    Transfers Overall transfer level: Needs assistance                 General transfer comment: attempted to use Stedy today, unable to stand due to back/hip pain.     Balance Overall balance assessment: Needs assistance Sitting-balance support: Bilateral upper extremity supported, Feet supported Sitting balance-Leahy Scale: Poor Sitting balance - Comments: not able to fully sit upright today, B hands for support, L lateral lean Postural control: Left lateral lean                                 ADL either performed or assessed with clinical judgement   ADL Overall ADL's : Needs assistance/impaired Eating/Feeding: Independent                                          Extremity/Trunk Assessment              Vision       Perception     Praxis      Cognition Arousal/Alertness: Awake/alert Behavior During Therapy: WFL for tasks assessed/performed, Impulsive Overall Cognitive Status: No family/caregiver present to determine baseline cognitive functioning  General Comments: Pt alert today, fully aware of deficits but has not been able to understand need for post acute care, wants to speak further with husband to decide about postacute vs home        Exercises      Shoulder Instructions       General Comments      Pertinent Vitals/ Pain       Pain Assessment Pain Assessment: No/denies pain Pain Score: 5  Faces Pain Scale: Hurts even more Pain Location: R hip/low back Pain Descriptors / Indicators: Discomfort, Grimacing, Moaning, Sharp Pain Intervention(s): Limited activity within patient's tolerance  Home  Living                                          Prior Functioning/Environment              Frequency  Min 2X/week        Progress Toward Goals  OT Goals(current goals can now be found in the care plan section)  Progress towards OT goals: Progressing toward goals  Acute Rehab OT Goals Patient Stated Goal: To decrease pain OT Goal Formulation: With patient Time For Goal Achievement: 10/15/22 Potential to Achieve Goals: Good ADL Goals Pt Will Perform Upper Body Dressing: with set-up;sitting Pt Will Perform Lower Body Dressing: with set-up;with supervision;sit to/from stand Pt Will Transfer to Toilet: with supervision;bedside commode Pt Will Perform Toileting - Clothing Manipulation and hygiene: with modified independence  Plan Discharge plan remains appropriate    Co-evaluation                 AM-PAC OT "6 Clicks" Daily Activity     Outcome Measure   Help from another person eating meals?: None Help from another person taking care of personal grooming?: A Little Help from another person toileting, which includes using toliet, bedpan, or urinal?: A Lot Help from another person bathing (including washing, rinsing, drying)?: A Lot Help from another person to put on and taking off regular upper body clothing?: A Little Help from another person to put on and taking off regular lower body clothing?: Total 6 Click Score: 15    End of Session Equipment Utilized During Treatment: Gait belt;Rolling walker (2 wheels);Other (comment) Antony Salmon)  OT Visit Diagnosis: Unsteadiness on feet (R26.81);Other abnormalities of gait and mobility (R26.89);Muscle weakness (generalized) (M62.81);History of falling (Z91.81);Pain   Activity Tolerance Patient limited by pain   Patient Left in bed;with call bell/phone within reach;with bed alarm set   Nurse Communication Mobility status        Time: 8295-6213 OT Time Calculation (min): 17 min  Charges: OT General  Charges $OT Visit: 1 Visit OT Treatments $Therapeutic Activity: 8-22 mins  [place], OTR/L   Alexis Goodell 10/04/2022, 1:55 PM

## 2022-10-05 ENCOUNTER — Inpatient Hospital Stay (HOSPITAL_COMMUNITY): Payer: Medicare HMO

## 2022-10-05 DIAGNOSIS — S32029A Unspecified fracture of second lumbar vertebra, initial encounter for closed fracture: Secondary | ICD-10-CM

## 2022-10-05 DIAGNOSIS — S32020A Wedge compression fracture of second lumbar vertebra, initial encounter for closed fracture: Secondary | ICD-10-CM | POA: Diagnosis not present

## 2022-10-05 LAB — BASIC METABOLIC PANEL
Anion gap: 12 (ref 5–15)
BUN: 20 mg/dL (ref 8–23)
CO2: 23 mmol/L (ref 22–32)
Calcium: 8.9 mg/dL (ref 8.9–10.3)
Chloride: 98 mmol/L (ref 98–111)
Creatinine, Ser: 0.96 mg/dL (ref 0.44–1.00)
GFR, Estimated: >60 mL/min (ref >60)
Glucose, Bld: 92 mg/dL (ref 70–99)
Potassium: 3.4 mmol/L — ABNORMAL LOW (ref 3.5–5.1)
Sodium: 133 mmol/L — ABNORMAL LOW (ref 135–145)

## 2022-10-05 LAB — GLUCOSE, CAPILLARY
Glucose-Capillary: 111 mg/dL — ABNORMAL HIGH (ref 70–99)
Glucose-Capillary: 173 mg/dL — ABNORMAL HIGH (ref 70–99)
Glucose-Capillary: 241 mg/dL — ABNORMAL HIGH (ref 70–99)
Glucose-Capillary: 190 mg/dL — ABNORMAL HIGH (ref 70–99)

## 2022-10-05 MED ORDER — APIXABAN 5 MG PO TABS
10.0000 mg | ORAL_TABLET | Freq: Two times a day (BID) | ORAL | Status: DC
  Administered 2022-10-05 – 2022-10-10 (×10): 10 mg via ORAL
  Filled 2022-10-05 (×10): qty 2

## 2022-10-05 MED ORDER — POTASSIUM CHLORIDE CRYS ER 20 MEQ PO TBCR
60.0000 meq | EXTENDED_RELEASE_TABLET | Freq: Once | ORAL | Status: AC
  Administered 2022-10-05: 60 meq via ORAL
  Filled 2022-10-05: qty 3

## 2022-10-05 MED ORDER — APIXABAN 5 MG PO TABS: 5.0000 mg | ORAL_TABLET | Freq: Two times a day (BID) | ORAL | Status: DC

## 2022-10-05 NOTE — Progress Notes (Signed)
Physical Therapy Treatment Patient Details Name: Stacy Kirk MRN: [id number] DOB: 30-Jun-1945 Today's Date: 10/05/2022   History of Present Illness The pt is a 77 yo female presenting 7/5 after a fall at home. Work up revealed acute L2 compression fx. PMH includes: obesity, arthritis, cancer, DM II, HTN, and hypothyroidism.    PT Comments  Prior to session had Ortho Tech come and adjust back brace so that it could be donned in sitting. Pt requiring mod-maxA to come to EoB. Once EoB donned back brace, pt with complaints of it "strangling" her due to breast plate. Tried to have pt sit more errect and not leaning to L but pt unable to maintain. Attempted to stand but pt reports increased R ankle pain and ankle noted to roll into eversion. PT tried to decrease ankle roll but pt still unable to come to upright. Given pt inability to transfers at this time, d/c plans remain appropriate. PT will continue to follow acutely.      Assistance Recommended at Discharge Frequent or constant Supervision/Assistance  If plan is discharge home, recommend the following:  Can travel by private vehicle    Two people to help with walking and/or transfers;A lot of help with bathing/dressing/bathroom;Assistance with cooking/housework;Assist for transportation;Help with stairs or ramp for entrance;Direct supervision/assist for medications management   No  Equipment Recommendations  Rolling walker (2 wheels);BSC/3in1    Recommendations for Other Services       Precautions / Restrictions Precautions Precautions: Fall;Back Precaution Comments: pt reports no one ever told her what her back precautions are Required Braces or Orthoses: Spinal Brace Spinal Brace: Thoracolumbosacral orthotic;Applied in sitting position (had ortho tech adjust for better fit) Restrictions Weight Bearing Restrictions: No     Mobility  Bed Mobility Overal bed mobility: Needs Assistance Bed Mobility: Sidelying to Sit, Sit to  Supine Rolling: Mod assist Sidelying to sit: Mod assist, Max assist   Sit to supine: Max assist   General bed mobility comments: modA for rolling, modA for bringing trunk to upright and max A for scooting hips to EoB with pad, brace placed in sitting, pt with complaints of upper breastplate digging into her neck.    Transfers Overall transfer level: Needs assistance                 General transfer comment: attempted to come to standing in RW x 4 and pt ankle goes into eversion and pt unable to bear weight through R ankle due to pain, ankle painful with PROM    Ambulation/Gait                       Balance Overall balance assessment: Needs assistance Sitting-balance support: Bilateral upper extremity supported, Feet supported Sitting balance-Leahy Scale: Poor Sitting balance - Comments: continues to have increased L lateral lean and B UE support making it difficult to don brace Postural control: Left lateral lean                                  Cognition Arousal/Alertness: Awake/alert Behavior During Therapy: WFL for tasks assessed/performed, Impulsive Overall Cognitive Status: No family/caregiver present to determine baseline cognitive functioning                                 General Comments: Pt alert today, fully aware of deficits but has not  been able to understand need for post acute care, wants to speak further with husband to decide about postacute vs home           General Comments General comments (skin integrity, edema, etc.): VSS on RA      Pertinent Vitals/Pain Pain Assessment Pain Assessment: Faces Faces Pain Scale: Hurts whole lot Pain Location: R ankle pain with weightbearing Pain Descriptors / Indicators: Discomfort, Grimacing, Moaning, Sharp Pain Intervention(s): Limited activity within patient's tolerance, Monitored during session, Repositioned     PT Goals (current goals can now be found in the care  plan section) Acute Rehab PT Goals PT Goal Formulation: With patient Time For Goal Achievement: 10/15/22 Potential to Achieve Goals: Good Progress towards PT goals: Not progressing toward goals - comment    Frequency    Min 3X/week      PT Plan Current plan remains appropriate       AM-PAC PT "6 Clicks" Mobility   Outcome Measure  Help needed turning from your back to your side while in a flat bed without using bedrails?: A Lot Help needed moving from lying on your back to sitting on the side of a flat bed without using bedrails?: A Lot Help needed moving to and from a bed to a chair (including a wheelchair)?: Total Help needed standing up from a chair using your arms (e.g., wheelchair or bedside chair)?: Total Help needed to walk in hospital room?: Total Help needed climbing 3-5 steps with a railing? : Total 6 Click Score: 8    End of Session Equipment Utilized During Treatment: Gait belt Activity Tolerance: Patient limited by pain Patient left: in bed;with call bell/phone within reach;with bed alarm set Nurse Communication: Mobility status PT Visit Diagnosis: Unsteadiness on feet (R26.81);Muscle weakness (generalized) (M62.81);Other abnormalities of gait and mobility (R26.89);Pain Pain - Right/Left: Right Pain - part of body: Ankle and joints of foot;Leg     Time: 1350-1423 PT Time Calculation (min) (ACUTE ONLY): 33 min  Charges:    $Therapeutic Activity: 23-37 mins PT General Charges $$ ACUTE PT VISIT: 1 Visit                      B. Beverely Risen PT, DPT Acute Rehabilitation Services Please use secure chat or  Call Office [phone]    Elon Alas [facility] 10/05/2022, 2:33 PM

## 2022-10-05 NOTE — Progress Notes (Signed)
ANTICOAGULATION CONSULT NOTE - follow-up  Pharmacy Consult for Lovenox transition to apixaban Indication: subsegmental pulmonary embolus  Allergies  Allergen Reactions   Erythromycin Other (See Comments)    "tears" her stomach up. Major digestive upset.   Tramadol Shortness Of Breath and Other (See Comments)    Felt like lungs filled up; unable to lay down   Albumin (Human) Other (See Comments)    Pt unsure of reaction   Aspirin Itching and Other (See Comments)    Cause lethargy   Codeine Itching    Cause lethargy   Cortisone Other (See Comments)    NO STEROID INJECTIONS; headaches   Propoxyphene Other (See Comments)    Terrible reaction; headaches   Simvastatin Itching and Other (See Comments)    Severe muscle aches   Triamcinolone Acetonide Other (See Comments)    headache    Patient Measurements: Height: 5' (152.4 cm) Weight: 84.3 kg (185 lb 13.6 oz) IBW/kg (Calculated) : 45.5  Vital Signs: Temp: 98.3 F (36.8 C) (07/10 1313) Temp Source: Oral (07/10 1313) BP: 136/78 (07/10 1313) Pulse Rate: 82 (07/10 1313)  Labs: Recent Labs    10/03/22 0420 10/04/22 0638 10/04/22 0700 10/05/22 0408  HGB 15.3* 15.6*  --   --   HCT 46.0 45.8  --   --   PLT 239 248  --   --   CREATININE 0.94  --  1.09* 0.96     Estimated Creatinine Clearance: 45.8 mL/min (by C-G formula based on SCr of 0.96 mg/dL).   Medical History: Past Medical History:  Diagnosis Date   Arthritis    Cancer (HCC)    ovarian, skin face- skin cancer   Complication of anesthesia    slow to awaken everday, even slower with anesthesia   Constipation    Diabetes mellitus    Type II   GERD (gastroesophageal reflux disease)    Headache 05/12/2015   "after a fall" headache for 8 months   Hemorrhoids    Hernia    History of kidney stones    passed   Hypertension    Hypothyroidism    Loss of appetite    Muscle spasm    back   Osteoporosis    Redness    abd wound   Urine frequency    unable  to hold urine    Wears hearing aid    bilateral     Medications:  Scheduled:   apixaban  10 mg Oral BID   Followed by   Melene Muller ON 10/12/2022] apixaban  5 mg Oral BID   feeding supplement  237 mL Oral BID BM   insulin aspart  0-15 Units Subcutaneous TID WC   levothyroxine  100 mcg Oral Q0600   multivitamin with minerals  1 tablet Oral Daily   polyethylene glycol  17 g Oral BID   predniSONE  10 mg Oral Q breakfast   senna-docusate  2 tablet Oral BID   Infusions:   cefTRIAXone (ROCEPHIN)  IV 1 g (10/05/22 1555)    Assessment: 77 y.o. F found to have subsegmental PE ijn RLL on CT abd/pelvis. To begin Lovenox. Pt was on Lovenox 40mg  for VTE prophylaxis - last dose 7/7 2109. CBC stable.  Consult received to transition off lovenox / on to apixaban  Goal of Therapy:  Monitor platelets by anticoagulation protocol: Yes   Plan:  DC lovenox (last dose given ~10:30 10/05/2022) Start apixaban 10 mg po bid (first dose 22:00 10/05/2022) x7 days f/b 5 mg  po bid thereafter Monitor for signs of bleeding Pharmacy will sign off on consult but continue to monitor making recs prn Thank you  Greta Doom BS, PharmD, BCPS Clinical Pharmacist 10/05/2022 4:27 PM  Contact: [phone] after 3 PM  "Be curious, not judgmental..." -Debbora Dus

## 2022-10-05 NOTE — Progress Notes (Signed)
Progress Note   Patient: Stacy Kirk NGE:[id number] DOB: 1945-04-21 DOA: 09/30/2022     3 DOS: the patient was seen and examined on 10/05/2022   Brief hospital course: 77 y.o. female with medical history significant for T2DM, HTN, hypothyroidism, lumbar stenosis s/p L4-5 interbody fusion 2018, obesity who presented to the ED for evaluation after a fall at home. Patient states that she normally ambulates with a cane.  Her husband was trying to help her off the couch earlier today when she slipped and fell.  She landed on her bottom.  She developed significant posterior right hip pain.  She was unable to stand on her own.  EMS were called and she was brought to the ED for further evaluation.  She did not hit her head or lose consciousness.  She says pain is tolerable after she received IV morphine however still has not been able to stand with assistance.  Evaluation reviewed L2 compression fracture.  She was hospitalized for further management.   Assessment and Plan: Acute L2 compression fracture Detected on CT scan.  No other injuries noted on multiple imaging studies done in the emergency department.  C -ED PA discussed with neurosurgery who recommended TLSO brace and physical therapy. -This is also in the setting of previous L4-L5 spinal fusion surgery by Dr. Wynetta Emery in 2018. Physical therapy recommended SNF but patient not very keen on this.   Continue with TLSO brace when she is up and about.  Pain control.   Acute pulmonary embolism and RLE DVT PE incidentally noted on CT abdomen and pelvis, confirmed on CTA chest. No previous history of same.  She was started on therapeutic Lovenox.  Lower extremity Doppler studies confirms RLE DVT  Echocardiogram does show a mildly reduced RV function. -Vital signs are stable.  -will transition to eliquis   Chronic systolic CHF Echocardiogram shows diminished EF of 40 to 45%.  Global hypokinesis noted.  Moderate LVH noted.  Moderate aortic valve  stenosis noted. No previous echocardiogram in our system or in care everywhere. Seems to be fairly euvolemic at this time. Could consider initiating ACE inhibitor/ARB.  However her blood pressures are noted to be low this morning.  Discontinued her diltiazem  Since she is otherwise relatively stable further management can be pursued in the outpatient setting with ambulatory referral to cardiology. EKG done on 7/5 was reviewed.   Transient altered mental status Had an episode of agitation on 7/7 which appears to have resolved on its own.  No focal neurological deficits.     Urinary tract infection -Patient had abdominal discomfort yesterday.  CT of the abdomen pelvis did not show any acute findings.  LFTs and lipase were normal a few days ago.  UA was done which was noted to be abnormal.  Could be the reason for her discomfort.   -Started on rocephin   Constipation Bowel regimen initiated.   Hypokalemia Repleted.   Diabetes mellitus type 2 On SSI.   Essential hypertension See discussion under CHF above.     Hypothyroidism Continue with levothyroxine.   Chronic steroid use Based on medication reconciliation as well as notes from her PCP as recently as May 2024 she is on prednisone 10 mg daily.  This was reinitiated.   Obesity Estimated body mass index is 36.3 kg/m as calculated from the following:   Height as of this encounter: 5' (1.524 m).   Weight as of this encounter: 84.3 kg.       Subjective: Complaining of  back pains  Physical Exam: Vitals:   10/04/22 0824 10/04/22 2037 10/05/22 0544 10/05/22 1313  BP: 110/87 135/77 (!) 158/83 136/78  Pulse: 96 98 86 82  Resp: 18   18  Temp: 98.2 F (36.8 C) 98.2 F (36.8 C) 98.5 F (36.9 C) 98.3 F (36.8 C)  TempSrc: Oral Oral Oral Oral  SpO2: 94% 94% 96% 96%  Weight:      Height:       General exam: Awake, laying in bed, in nad Respiratory system: Normal respiratory effort, no wheezing Cardiovascular system: regular  rate, s1, s2 Gastrointestinal system: Soft, nondistended, positive BS Central nervous system: CN2-12 grossly intact, strength intact Extremities: Perfused, no clubbing Skin: Normal skin turgor, no notable skin lesions seen Psychiatry: Mood normal // no visual hallucinations   Data Reviewed:  Labs reviewed: Na 133, K 3.4, Cr 0.96  Family Communication: Pt in room, family not at bedside  Disposition: Status is: Inpatient Remains inpatient appropriate because: Severity of illness  Planned Discharge Destination: Skilled nursing facility     Author: Rickey Barbara, MD 10/05/2022 4:16 PM  For on call review [email or web address].

## 2022-10-06 ENCOUNTER — Other Ambulatory Visit (HOSPITAL_COMMUNITY): Payer: Self-pay

## 2022-10-06 DIAGNOSIS — S32020A Wedge compression fracture of second lumbar vertebra, initial encounter for closed fracture: Secondary | ICD-10-CM | POA: Diagnosis not present

## 2022-10-06 DIAGNOSIS — S32029A Unspecified fracture of second lumbar vertebra, initial encounter for closed fracture: Secondary | ICD-10-CM | POA: Diagnosis not present

## 2022-10-06 LAB — GLUCOSE, CAPILLARY
Glucose-Capillary: 143 mg/dL — ABNORMAL HIGH (ref 70–99)
Glucose-Capillary: 108 mg/dL — ABNORMAL HIGH (ref 70–99)
Glucose-Capillary: 160 mg/dL — ABNORMAL HIGH (ref 70–99)
Glucose-Capillary: 102 mg/dL — ABNORMAL HIGH (ref 70–99)

## 2022-10-06 MED ORDER — DOCUSATE SODIUM 100 MG PO CAPS
100.0000 mg | ORAL_CAPSULE | Freq: Two times a day (BID) | ORAL | Status: DC
  Administered 2022-10-06 – 2022-10-09 (×6): 100 mg via ORAL
  Filled 2022-10-06 (×8): qty 1

## 2022-10-06 NOTE — Plan of Care (Signed)
  Problem: Coping: Goal: Ability to adjust to condition or change in health will improve Outcome: Progressing   Problem: Metabolic: Goal: Ability to maintain appropriate glucose levels will improve Outcome: Progressing   Problem: Skin Integrity: Goal: Risk for impaired skin integrity will decrease Outcome: Progressing   

## 2022-10-06 NOTE — Progress Notes (Signed)
Occupational Therapy Treatment Patient Details Name: Stacy Kirk MRN: [id number] DOB: May 18, 1945 Today's Date: 10/06/2022   History of present illness The pt is a 77 yo female presenting 7/5 after a fall at home. Work up revealed acute L2 compression fx. PMH includes: obesity, arthritis, cancer, DM II, HTN, and hypothyroidism.   OT comments  Pt feeling okay at rest today, minimal pain, some confusion on how to use phone upon arrival. Required multiple attempts, verbal cues to figure out how to dial out, OT had to dial for Pt. Pt does not remember prior sessions and progress with therapies. Pt stood up using Stedy today twice, up to 1 min at a time, displays good BLE strength to power up with minG to minA assistance from elevated surface. Pt R hip started hurting too much and had to lay back down. Total x2 for scooting back into bed. Pt would benefit greatly from continued skilled therapy to maximize functional strength and improve activity tolerance.   Recommendations for follow up therapy are one component of a multi-disciplinary discharge planning process, led by the attending physician.  Recommendations may be updated based on patient status, additional functional criteria and insurance authorization.    Assistance Recommended at Discharge Frequent or constant Supervision/Assistance  Patient can return home with the following  A lot of help with walking and/or transfers;A lot of help with bathing/dressing/bathroom;Assistance with cooking/housework;Assist for transportation;Help with stairs or ramp for entrance   Equipment Recommendations  Other (comment) (defer)    Recommendations for Other Services      Precautions / Restrictions Precautions Precautions: Fall;Back Precaution Booklet Issued: No Required Braces or Orthoses: Spinal Brace Spinal Brace: Thoracolumbosacral orthotic;Applied in sitting position Restrictions Weight Bearing Restrictions: No       Mobility Bed  Mobility Overal bed mobility: Needs Assistance Bed Mobility: Sidelying to Sit, Sit to Supine   Sidelying to sit: Mod assist, Max assist   Sit to supine: HOB elevated, Max assist   General bed mobility comments: assist for BLEs and power up    Transfers Overall transfer level: Needs assistance Equipment used: Ambulation equipment used (stedy) Transfers: Sit to/from Stand Sit to Stand: Min assist, From elevated surface           General transfer comment: Pt able to stand twice today, up to 1 min at a time using Stedy and elevated surface. Transfer via Lift Equipment: Stedy   Balance Overall balance assessment: Needs assistance Sitting-balance support: Bilateral upper extremity supported, Feet supported Sitting balance-Leahy Scale: Poor Sitting balance - Comments: continues to have increased L lateral lean and B UE support making it difficult to don brace Postural control: Left lateral lean Standing balance support: Bilateral upper extremity supported, During functional activity, Reliant on assistive device for balance Standing balance-Leahy Scale: Poor Standing balance comment: dependent on external assist and BUE support                           ADL either performed or assessed with clinical judgement   ADL                                              Extremity/Trunk Assessment Upper Extremity Assessment Upper Extremity Assessment: Overall WFL for tasks assessed            Vision  Perception     Praxis      Cognition Arousal/Alertness: Awake/alert Behavior During Therapy: WFL for tasks assessed/performed, Impulsive Overall Cognitive Status: No family/caregiver present to determine baseline cognitive functioning Area of Impairment: Orientation, Memory, Safety/judgement, Awareness, Problem solving, Following commands                 Orientation Level: Disoriented to, Time   Memory: Decreased recall of precautions,  Decreased short-term memory         General Comments: Pt able to follow commands consistently today, decreased recall of precautions, needed multiple attempts and verbal commands to dial phone, not able to dial correct number after multiple attempts, OT assisted in calling husband        Exercises      Shoulder Instructions       General Comments      Pertinent Vitals/ Pain       Pain Assessment Pain Assessment: 0-10 Pain Score: 6  Faces Pain Scale: Hurts even more  Home Living                                          Prior Functioning/Environment              Frequency  Min 2X/week        Progress Toward Goals  OT Goals(current goals can now be found in the care plan section)  Progress towards OT goals: Progressing toward goals  Acute Rehab OT Goals Patient Stated Goal: to improve functional strength OT Goal Formulation: With patient Time For Goal Achievement: 10/15/22 Potential to Achieve Goals: Good ADL Goals Pt Will Perform Upper Body Dressing: with set-up;sitting Pt Will Perform Lower Body Dressing: with set-up;with supervision;sit to/from stand Pt Will Transfer to Toilet: with supervision;bedside commode Pt Will Perform Toileting - Clothing Manipulation and hygiene: with modified independence  Plan Discharge plan remains appropriate    Co-evaluation                 AM-PAC OT "6 Clicks" Daily Activity     Outcome Measure   Help from another person eating meals?: None Help from another person taking care of personal grooming?: A Little Help from another person toileting, which includes using toliet, bedpan, or urinal?: A Lot Help from another person bathing (including washing, rinsing, drying)?: A Lot Help from another person to put on and taking off regular upper body clothing?: A Little Help from another person to put on and taking off regular lower body clothing?: Total 6 Click Score: 15    End of Session  Equipment Utilized During Treatment: Gait belt;Other (comment) (stedy)  OT Visit Diagnosis: Unsteadiness on feet (R26.81);Other abnormalities of gait and mobility (R26.89);Muscle weakness (generalized) (M62.81);History of falling (Z91.81);Pain   Activity Tolerance Patient limited by pain   Patient Left in bed;with call bell/phone within reach;with nursing/sitter in room   Nurse Communication Mobility status        Time: 1700-1717 OT Time Calculation (min): 17 min  Charges: OT General Charges $OT Visit: 1 Visit OT Treatments $Therapeutic Activity: 8-22 mins  [address], OTR/L   Alexis Goodell 10/06/2022, 5:23 PM

## 2022-10-06 NOTE — Discharge Instructions (Signed)
Information on my medicine - ELIQUIS (apixaban)  Why was Eliquis prescribed for you? Eliquis was prescribed to treat blood clots that may have been found in the veins of your legs (deep vein thrombosis) or in your lungs (pulmonary embolism) and to reduce the risk of them occurring again.  What do You need to know about Eliquis ? The starting dose is 10 mg (two 5 mg tablets) taken TWICE daily for the FIRST SEVEN (7) DAYS, then on (enter date)  10/12/22  the dose is reduced to ONE 5 mg tablet taken TWICE daily.  Eliquis may be taken with or without food.   Try to take the dose about the same time in the morning and in the evening. If you have difficulty swallowing the tablet whole please discuss with your pharmacist how to take the medication safely.  Take Eliquis exactly as prescribed and DO NOT stop taking Eliquis without talking to the doctor who prescribed the medication.  Stopping may increase your risk of developing a new blood clot.  Refill your prescription before you run out.  After discharge, you should have regular check-up appointments with your healthcare provider that is prescribing your Eliquis.    What do you do if you miss a dose? If a dose of ELIQUIS is not taken at the scheduled time, take it as soon as possible on the same day and twice-daily administration should be resumed. The dose should not be doubled to make up for a missed dose.  Important Safety Information A possible side effect of Eliquis is bleeding. You should call your healthcare provider right away if you experience any of the following: Bleeding from an injury or your nose that does not stop. Unusual colored urine (red or dark brown) or unusual colored stools (red or black). Unusual bruising for unknown reasons. A serious fall or if you hit your head (even if there is no bleeding).  Some medicines may interact with Eliquis and might increase your risk of bleeding or clotting while on Eliquis. To  help avoid this, consult your healthcare provider or pharmacist prior to using any new prescription or non-prescription medications, including herbals, vitamins, non-steroidal anti-inflammatory drugs (NSAIDs) and supplements.  This website has more information on Eliquis (apixaban): http://[email or web address].com/eliquis/home

## 2022-10-06 NOTE — Progress Notes (Signed)
Progress Note   Patient: Stacy Kirk ZOX:[id number] DOB: 1945/07/13 DOA: 09/30/2022     4 DOS: the patient was seen and examined on 10/06/2022   Brief hospital course: 77 y.o. female with medical history significant for T2DM, HTN, hypothyroidism, lumbar stenosis s/p L4-5 interbody fusion 2018, obesity who presented to the ED for evaluation after a fall at home. Patient states that she normally ambulates with a cane.  Her husband was trying to help her off the couch earlier today when she slipped and fell.  She landed on her bottom.  She developed significant posterior right hip pain.  She was unable to stand on her own.  EMS were called and she was brought to the ED for further evaluation.  She did not hit her head or lose consciousness.  She says pain is tolerable after she received IV morphine however still has not been able to stand with assistance.  Evaluation reviewed L2 compression fracture.  She was hospitalized for further management.   Assessment and Plan: Acute L2 compression fracture Detected on CT scan.  No other injuries noted on multiple imaging studies done in the emergency department.  C -ED PA discussed with neurosurgery who recommended TLSO brace and physical therapy. -This is also in the setting of previous L4-L5 spinal fusion surgery by Dr. Wynetta Emery in 2018. Physical therapy recommended SNF. Pt remains primarily bed bound at this time secondary to debility from acute fracture. -Continue analgesia. Discussed importance and purpose of SNF, pt seems to agree. Discussed with TOC for possible SNF placement   Acute pulmonary embolism and RLE DVT PE incidentally noted on CT abdomen and pelvis, confirmed on CTA chest. No previous history of same.  She was started on therapeutic Lovenox.  Lower extremity Doppler studies confirms RLE DVT  Echocardiogram does show a mildly reduced RV function. -Vital signs are stable.  -Now on eliquis   Chronic systolic CHF Echocardiogram shows  diminished EF of 40 to 45%.  Global hypokinesis noted.  Moderate LVH noted.  Moderate aortic valve stenosis noted. No previous echocardiogram in our system or in care everywhere. Seems to be fairly euvolemic at this time. Could consider initiating ACE inhibitor/ARB.  However her blood pressures are noted to be low this morning.  Discontinued her diltiazem  Since she is otherwise relatively stable further management can be pursued in the outpatient setting with ambulatory referral to cardiology. EKG done on 7/5 was reviewed.   Transient altered mental status Had an episode of agitation on 7/7 which appears to have resolved on its own.  No focal neurological deficits.     Urinary tract infection -Patient had abdominal discomfort yesterday.  CT of the abdomen pelvis did not show any acute findings.  LFTs and lipase were normal a few days ago.  UA was done which was noted to be abnormal.  Could be the reason for her discomfort.   -Completed course of rocephin   Constipation Bowel regimen initiated.   Hypokalemia Repleted.   Diabetes mellitus type 2 On SSI.   Essential hypertension See discussion under CHF above.     Hypothyroidism Continue with levothyroxine.   Chronic steroid use Based on medication reconciliation as well as notes from her PCP as recently as May 2024 she is on prednisone 10 mg daily.  This was reinitiated.   Obesity Estimated body mass index is 36.3 kg/m as calculated from the following:   Height as of this encounter: 5' (1.524 m).   Weight as of this encounter:  84.3 kg.   Constipation -Despite BID miralax and BID senna, there still is no significant result -Will give trial of soap suds enema -Will order BID colace     Subjective: Still having back pains. Admits to being essentially unable to care for herself in her current state.  Physical Exam: Vitals:   10/05/22 1313 10/05/22 2036 10/06/22 0444 10/06/22 0759  BP: 136/78 127/76 (!) 157/93 (!) 150/89   Pulse: 82 (!) 107 84 88  Resp: 18 18 18 18   Temp: 98.3 F (36.8 C) 98.6 F (37 C) 98.3 F (36.8 C) 97.8 F (36.6 C)  TempSrc: Oral Oral Oral Oral  SpO2: 96% 95% 91% 97%  Weight:      Height:       General exam: Conversant, in no acute distress Respiratory system: normal chest rise, clear, no audible wheezing Cardiovascular system: regular rhythm, s1-s2 Gastrointestinal system: Nondistended, nontender, pos BS Central nervous system: No seizures, no tremors Extremities: No cyanosis, no joint deformities Skin: No rashes, no pallor Psychiatry: Affect normal // no auditory hallucinations   Data Reviewed:  Labs reviewed: Na 133, K 3.4, Cr 0.96  Family Communication: Pt in room, family not at bedside  Disposition: Status is: Inpatient Remains inpatient appropriate because: Severity of illness  Planned Discharge Destination: Skilled nursing facility     Author: Rickey Barbara, MD 10/06/2022 3:52 PM  For on call review [email or web address].

## 2022-10-06 NOTE — TOC Benefit Eligibility Note (Signed)
Pharmacy Patient Advocate Encounter  Insurance verification completed.    The patient is insured through [facility]   Ran test claim for Eliquis 5mg  and the current 30 day co-pay is $45.   This test claim was processed through [employer]- copay amounts may vary at other pharmacies due to [employer], or as the patient moves through the different stages of their insurance plan.

## 2022-10-06 NOTE — NC FL2 (Signed)
[place] MEDICAID FL2 LEVEL OF CARE FORM     IDENTIFICATION  Patient Name: Stacy Kirk Birthdate: May 26, 1945 Sex: female Admission Date (Current Location): 09/30/2022  [facility] and [place] Number:  Producer, television/film/video and Address:         Provider Number: [id number]  Attending Physician Name and Address:  Jerald Kief, MD  Relative Name and Phone Number:       Current Level of Care: Hospital Recommended Level of Care: Skilled Nursing Facility Prior Approval Number:    Date Approved/Denied:   PASRR Number: [id number]  Discharge Plan: SNF    Current Diagnoses: Patient Active Problem List   Diagnosis Date Noted   Lumbar compression fracture (HCC) 10/02/2022   Closed compression fracture of L2 lumbar vertebra, initial encounter (HCC) 09/30/2022   Hypokalemia 09/30/2022   Hypertension 09/30/2022   Controlled type 2 diabetes mellitus with complication, without long-term current use of insulin (HCC) 08/05/2022   Chronic constipation 08/05/2022   OAB (overactive bladder) 08/05/2022   Hypothyroidism 08/05/2022   Fatigue 08/05/2022   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-L5 level 09/07/2016   Abdominal pain, RLQ 01/24/2011   Abdominal right lower quadrant swelling 01/24/2011   Low back pain 01/24/2011   Knee pain, right 01/24/2011    Orientation RESPIRATION BLADDER Height & Weight     Time, Self, Situation, Place  Normal Incontinent Weight: 185 lb 13.6 oz (84.3 kg) Height:  5' (152.4 cm)  BEHAVIORAL SYMPTOMS/MOOD NEUROLOGICAL BOWEL NUTRITION STATUS      Continent Diet (see d/c summary)  AMBULATORY STATUS COMMUNICATION OF NEEDS Skin   Extensive Assist Verbally Normal                       Personal Care Assistance Level of Assistance  Bathing, Dressing, Feeding Bathing Assistance: Maximum assistance Feeding assistance: Independent Dressing Assistance: Maximum assistance     Functional Limitations Info  Sight, Hearing, Speech Sight Info: Adequate Hearing  Info: Impaired Speech Info: Adequate    SPECIAL CARE FACTORS FREQUENCY  PT (By licensed PT), OT (By licensed OT)     PT Frequency: 5x/week OT Frequency: 5x/week            Contractures Contractures Info: Not present    Additional Factors Info  Code Status, Allergies Code Status Info: Full code Allergies Info: Erythromycin, tramadol, codeine, Albumin (human), aspirin, Simvastatin, cortisone, Triamcinolone Acetonide, propoxyphene,           Current Medications (10/06/2022):  This is the current hospital active medication list Current Facility-Administered Medications  Medication Dose Route Frequency Provider Last Rate Last Admin   acetaminophen (TYLENOL) tablet 650 mg  650 mg Oral Q6H PRN Charlsie Quest, MD   650 mg at 10/06/22 0645   Or   acetaminophen (TYLENOL) suppository 650 mg  650 mg Rectal Q6H PRN Charlsie Quest, MD       apixaban (ELIQUIS) tablet 10 mg  10 mg Oral BID Reome, Earle J, RPH   10 mg at 10/06/22 0947   Followed by   Melene Muller ON 10/12/2022] apixaban (ELIQUIS) tablet 5 mg  5 mg Oral BID Reome, Earle J, RPH       baclofen (LIORESAL) tablet 10 mg  10 mg Oral Daily PRN Osvaldo Shipper, MD   10 mg at 10/06/22 1327   bisacodyl (DULCOLAX) suppository 10 mg  10 mg Rectal Daily PRN Osvaldo Shipper, MD       cefTRIAXone (ROCEPHIN) 1 g in sodium chloride 0.9 % 100 mL  IVPB  1 g Intravenous Q24H Jerald Kief, MD 200 mL/hr at 10/05/22 1555 1 g at 10/05/22 1555   feeding supplement (ENSURE ENLIVE / ENSURE PLUS) liquid 237 mL  237 mL Oral BID BM Darreld Mclean R, MD   237 mL at 10/06/22 0952   HYDROcodone-acetaminophen (NORCO/VICODIN) 5-325 MG per tablet 1-2 tablet  1-2 tablet Oral Q4H PRN Charlsie Quest, MD   2 tablet at 10/02/22 0932   insulin aspart (novoLOG) injection 0-15 Units  0-15 Units Subcutaneous TID WC Charlsie Quest, MD   2 Units at 10/06/22 0947   levothyroxine (SYNTHROID) tablet 100 mcg  100 mcg Oral Q0600 Darreld Mclean R, MD   100 mcg at 10/06/22 0643    morphine (PF) 2 MG/ML injection 2 mg  2 mg Intravenous Q4H PRN Darreld Mclean R, MD   2 mg at 10/01/22 1042   multivitamin with minerals tablet 1 tablet  1 tablet Oral Daily Osvaldo Shipper, MD   1 tablet at 10/06/22 0947   ondansetron (ZOFRAN) tablet 4 mg  4 mg Oral Q6H PRN Charlsie Quest, MD       Or   ondansetron (ZOFRAN) injection 4 mg  4 mg Intravenous Q6H PRN Darreld Mclean R, MD   4 mg at 10/03/22 0519   polyethylene glycol (MIRALAX / GLYCOLAX) packet 17 g  17 g Oral BID Osvaldo Shipper, MD   17 g at 10/06/22 0951   predniSONE (DELTASONE) tablet 10 mg  10 mg Oral Q breakfast Osvaldo Shipper, MD   10 mg at 10/06/22 0947   senna-docusate (Senokot-S) tablet 2 tablet  2 tablet Oral BID Osvaldo Shipper, MD   2 tablet at 10/06/22 1610     Discharge Medications: Please see discharge summary for a list of discharge medications.  Relevant Imaging Results:  Relevant Lab Results:   Additional Information SSN [id number] [place], [place]

## 2022-10-06 NOTE — TOC Initial Note (Signed)
Transition of Care [facility]) - Initial/Assessment Note    Patient Details  Name: Stacy Kirk MRN: [id number] Date of Birth: [birth year]  [facility]) CM/SW Contact:    Erin Sons, LCSW Phone Number: 10/06/2022, 2:06 PM  Clinical Narrative:                  CSW met with pt to discuss SNF as MD stated she is more willing to pursue SNF now. Pt confirms plan for SNF and states "I have no choice." She is agreable to SNF w/u and referrals to be sent to facilities in [place] and [place] areas.   FL2 done and bed requests sent in hub.   Expected Discharge Plan: Skilled Nursing Facility Barriers to Discharge: No Barriers Identified   Patient Goals and CMS Choice     Choice offered to / list presented to : Patient, Spouse      Expected Discharge Plan and Services                                   HH Arranged: PT, OT HH Agency: [facility] Date [facility] Agency Contacted: 10/03/22 Time HH Agency Contacted: [id number] Representative spoke with at [facility] Agency: [place]  Prior Living Arrangements/Services   Lives with:: Spouse Patient language and need for interpreter reviewed:: Yes Do you feel safe going back to the place where you live?: Yes      Need for Family Participation in Patient Care: Yes (Comment) Care giver support system in place?: Yes (comment)   Criminal Activity/Legal Involvement Pertinent to Current Situation/Hospitalization: No - Comment as needed  Activities of Daily Living Home Assistive Devices/Equipment: Cane (specify quad or straight) ADL Screening (condition at time of admission) Patient's cognitive ability adequate to safely complete daily activities?: Yes Is the patient deaf or have difficulty hearing?: Yes Does the patient have difficulty seeing, even when wearing glasses/contacts?: No Does the patient have difficulty concentrating, remembering, or making decisions?: No Patient able to express need for assistance with ADLs?:  Yes Does the patient have difficulty dressing or bathing?: No Independently performs ADLs?: Yes (appropriate for developmental age) Does the patient have difficulty walking or climbing stairs?: Yes Weakness of Legs: Both Weakness of Arms/Hands: None  Permission Sought/Granted   Permission granted to share information with : Yes, Verbal Permission Granted  Share Information with NAME: Tyrhonda Georgiades  Spouse  [phone]           Emotional Assessment Appearance:: Appears stated age Attitude/Demeanor/Rapport: Complaining Affect (typically observed): Irritable Orientation: : Oriented to Self, Oriented to  Time, Oriented to Place, Oriented to Situation Alcohol / Substance Use: Not Applicable Psych Involvement: No (comment)  Admission diagnosis:  Closed compression fracture of L2 lumbar vertebra, initial encounter (HCC) [S32.020A] Closed fracture of second lumbar vertebra, unspecified fracture morphology, initial encounter (HCC) [S32.029A] Lumbar compression fracture (HCC) [S32.000A] Patient Active Problem List   Diagnosis Date Noted   Lumbar compression fracture (HCC) 10/02/2022   Closed compression fracture of L2 lumbar vertebra, initial encounter (HCC) 09/30/2022   Hypokalemia 09/30/2022   Hypertension 09/30/2022   Controlled type 2 diabetes mellitus with complication, without long-term current use of insulin (HCC) 08/05/2022   Chronic constipation 08/05/2022   OAB (overactive bladder) 08/05/2022   Hypothyroidism 08/05/2022   Fatigue 08/05/2022   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-L5 level 09/07/2016   Abdominal pain, RLQ 01/24/2011   Abdominal right lower quadrant swelling 01/24/2011  Low back pain 01/24/2011   Knee pain, right 01/24/2011   PCP:  Eden Emms, NP Pharmacy:   [facility] [phone] - [address] [place] - [address] [place] [address] Phone: [phone] Fax: [phone]     Social Determinants of Health (SDOH) Social History: SDOH  Screenings   Food Insecurity: No Food Insecurity (10/01/2022)  Housing: Low Risk  (10/01/2022)  Transportation Needs: No Transportation Needs (10/01/2022)  Utilities: Not At Risk (10/01/2022)  Tobacco Use: Medium Risk (09/30/2022)   SDOH Interventions:     Readmission Risk Interventions     No data to display

## 2022-10-07 DIAGNOSIS — S32020A Wedge compression fracture of second lumbar vertebra, initial encounter for closed fracture: Secondary | ICD-10-CM | POA: Diagnosis not present

## 2022-10-07 DIAGNOSIS — S32029A Unspecified fracture of second lumbar vertebra, initial encounter for closed fracture: Secondary | ICD-10-CM | POA: Diagnosis not present

## 2022-10-07 LAB — GLUCOSE, CAPILLARY
Glucose-Capillary: 97 mg/dL (ref 70–99)
Glucose-Capillary: 110 mg/dL — ABNORMAL HIGH (ref 70–99)
Glucose-Capillary: 187 mg/dL — ABNORMAL HIGH (ref 70–99)
Glucose-Capillary: 169 mg/dL — ABNORMAL HIGH (ref 70–99)

## 2022-10-07 LAB — COMPREHENSIVE METABOLIC PANEL
ALT: 48 U/L — ABNORMAL HIGH (ref 0–44)
AST: 58 U/L — ABNORMAL HIGH (ref 15–41)
Albumin: 3.1 g/dL — ABNORMAL LOW (ref 3.5–5.0)
Alkaline Phosphatase: 50 U/L (ref 38–126)
Anion gap: 9 (ref 5–15)
BUN: 18 mg/dL (ref 8–23)
CO2: 29 mmol/L (ref 22–32)
Calcium: 9.1 mg/dL (ref 8.9–10.3)
Chloride: 99 mmol/L (ref 98–111)
Creatinine, Ser: 0.93 mg/dL (ref 0.44–1.00)
GFR, Estimated: >60 mL/min (ref >60)
Glucose, Bld: 104 mg/dL — ABNORMAL HIGH (ref 70–99)
Potassium: 3.6 mmol/L (ref 3.5–5.1)
Sodium: 137 mmol/L (ref 135–145)
Total Bilirubin: 0.9 mg/dL (ref 0.3–1.2)
Total Protein: 5.9 g/dL — ABNORMAL LOW (ref 6.5–8.1)

## 2022-10-07 LAB — CBC
HCT: 46.8 % — ABNORMAL HIGH (ref 36.0–46.0)
Hemoglobin: 15 g/dL (ref 12.0–15.0)
MCH: 30.1 pg (ref 26.0–34.0)
MCHC: 32.1 g/dL (ref 30.0–36.0)
MCV: 93.8 fL (ref 80.0–100.0)
Platelets: 224 10*3/uL (ref 150–400)
RBC: 4.99 MIL/uL (ref 3.87–5.11)
RDW: 14.4 % (ref 11.5–15.5)
WBC: 6.6 10*3/uL (ref 4.0–10.5)
nRBC: 0 % (ref 0.0–0.2)

## 2022-10-07 NOTE — Plan of Care (Signed)
  Problem: Coping: Goal: Ability to adjust to condition or change in health will improve Outcome: Progressing   Problem: Fluid Volume: Goal: Ability to maintain a balanced intake and output will improve Outcome: Progressing   Problem: Health Behavior/Discharge Planning: Goal: Ability to identify and utilize available resources and services will improve Outcome: Progressing Goal: Ability to manage health-related needs will improve Outcome: Progressing   Problem: Nutritional: Goal: Maintenance of adequate nutrition will improve Outcome: Progressing   Problem: Skin Integrity: Goal: Risk for impaired skin integrity will decrease Outcome: Progressing   Problem: Tissue Perfusion: Goal: Adequacy of tissue perfusion will improve Outcome: Progressing   Problem: Clinical Measurements: Goal: Will remain free from infection Outcome: Progressing Goal: Respiratory complications will improve Outcome: Progressing Goal: Cardiovascular complication will be avoided Outcome: Progressing   Problem: Activity: Goal: Risk for activity intolerance will decrease Outcome: Not Progressing   Problem: Nutrition: Goal: Adequate nutrition will be maintained Outcome: Progressing   Problem: Coping: Goal: Level of anxiety will decrease Outcome: Not Progressing   Problem: Elimination: Goal: Will not experience complications related to bowel motility Outcome: Not Progressing Goal: Will not experience complications related to urinary retention Outcome: Progressing   Problem: Pain Managment: Goal: General experience of comfort will improve Outcome: Not Progressing   Problem: Safety: Goal: Ability to remain free from injury will improve Outcome: Progressing   Problem: Skin Integrity: Goal: Risk for impaired skin integrity will decrease Outcome: Progressing

## 2022-10-07 NOTE — Progress Notes (Signed)
Physical Therapy Treatment Patient Details Name: Stacy Kirk MRN: [id number] DOB: 11-13-1945 Today's Date: 10/07/2022   History of Present Illness The pt is a 77 yo female presenting 7/5 after a fall at home. Work up revealed acute L2 compression fx. PMH includes: obesity, arthritis, cancer, DM II, HTN, and hypothyroidism.    PT Comments  The pt was able to make great progress this session, completing x3 sit-stand transfers with modA of 2 and use of RW, as well as two short bouts of ambulation (~27ft each). The pt is fearful of falling and requires increased cues and assist to maintain upright, as well as cues for safety and sequencing for mobility. She is motivated to improve, but has poor insight to limitations. Pt continues to complain of L ankle weakness, is able to move well for MMT, but does demo poor proprioception and relies on cues for positioning and corrections. No episodes of L ankle rolling into eversion with gait today, will continue to monitor for need for ankle brace or support. Is making slow but steady progress with mobility but still requires more assist than her spouse is able to provide at home.      Assistance Recommended at Discharge Frequent or constant Supervision/Assistance  If plan is discharge home, recommend the following:  Can travel by private vehicle    Two people to help with walking and/or transfers;A lot of help with bathing/dressing/bathroom;Assistance with cooking/housework;Assist for transportation;Help with stairs or ramp for entrance;Direct supervision/assist for medications management   No  Equipment Recommendations  Rolling walker (2 wheels);BSC/3in1    Recommendations for Other Services       Precautions / Restrictions Precautions Precautions: Fall;Back Precaution Booklet Issued: No Required Braces or Orthoses: Spinal Brace Spinal Brace: Thoracolumbosacral orthotic;Applied in sitting position Restrictions Weight Bearing Restrictions: No      Mobility  Bed Mobility Overal bed mobility: Needs Assistance Bed Mobility: Sidelying to Sit, Rolling Rolling: Mod assist Sidelying to sit: Mod assist       General bed mobility comments: modA to complete rolling and elevation of trunk to sitting. pt then able to maintain without assist    Transfers Overall transfer level: Needs assistance Equipment used: Rolling walker (2 wheels) Transfers: Sit to/from Stand Sit to Stand: Min assist, From elevated surface, Mod assist, +2 physical assistance   Step pivot transfers: Mod assist, +2 physical assistance       General transfer comment: modA of 2 to rise from EOB and to steady with steps to R for BSC. max cues for hand placement and repositioning. pt with x2 LOB backwards needing modA    Ambulation/Gait Ambulation/Gait assistance: Mod assist, +2 physical assistance Gait Distance (Feet): 5 Feet Assistive device: Rolling walker (2 wheels) Gait Pattern/deviations: Step-through pattern, Decreased stride length, Shuffle, Decreased weight shift to right, Decreased weight shift to left Gait velocity: decreased Gait velocity interpretation: <1.31 ft/sec, indicative of household ambulator   General Gait Details: small forwards steps with poor wt shift or clearance bilaterally. no overt buckling but pt dependent on modA and BUE. no overt eversion of L or R ankle this session      Balance Overall balance assessment: Needs assistance Sitting-balance support: Bilateral upper extremity supported, Feet supported Sitting balance-Leahy Scale: Poor Sitting balance - Comments: continues to have increased L lateral lean and B UE support making it difficult to don brace Postural control: Left lateral lean Standing balance support: Bilateral upper extremity supported, During functional activity, Reliant on assistive device for balance Standing balance-Leahy Scale:  Poor Standing balance comment: dependent on external assist and BUE support                             Cognition Arousal/Alertness: Awake/alert Behavior During Therapy: WFL for tasks assessed/performed, Impulsive Overall Cognitive Status: No family/caregiver present to determine baseline cognitive functioning Area of Impairment: Memory, Safety/judgement, Awareness, Problem solving, Following commands                     Memory: Decreased recall of precautions, Decreased short-term memory   Safety/Judgement: Decreased awareness of safety   Problem Solving: Slow processing, Decreased initiation, Difficulty sequencing, Requires verbal cues General Comments: Pt able to follow commands consistently today, decreased recall of precautions, at times pt motivated and asking to walk longer distances, poor insight to deficits and limitations. inconsistent report of pain and mobility of LLE        Exercises      General Comments General comments (skin integrity, edema, etc.): pt continues to have poor functional movement and proprioception of L foot/ankle. is able to maintain with decent strenth against MMT but does toe drag intermittently      Pertinent Vitals/Pain Pain Assessment Pain Assessment: Faces Faces Pain Scale: Hurts even more Pain Location: back with mobility Pain Descriptors / Indicators: Discomfort, Grimacing, Moaning, Sharp Pain Intervention(s): Limited activity within patient's tolerance, Monitored during session, RN gave pain meds during session     PT Goals (current goals can now be found in the care plan section) Acute Rehab PT Goals Patient Stated Goal: reduce pain PT Goal Formulation: With patient Time For Goal Achievement: 10/15/22 Potential to Achieve Goals: Good Progress towards PT goals: Progressing toward goals    Frequency    Min 3X/week      PT Plan Current plan remains appropriate       AM-PAC PT "6 Clicks" Mobility   Outcome Measure  Help needed turning from your back to your side while in a flat bed  without using bedrails?: A Lot Help needed moving from lying on your back to sitting on the side of a flat bed without using bedrails?: A Lot Help needed moving to and from a bed to a chair (including a wheelchair)?: Total Help needed standing up from a chair using your arms (e.g., wheelchair or bedside chair)?: Total Help needed to walk in hospital room?: Total Help needed climbing 3-5 steps with a railing? : Total 6 Click Score: 8    End of Session Equipment Utilized During Treatment: Gait belt Activity Tolerance: Patient limited by pain Patient left: in chair;with call bell/phone within reach;with chair alarm set Nurse Communication: Mobility status PT Visit Diagnosis: Unsteadiness on feet (R26.81);Muscle weakness (generalized) (M62.81);Other abnormalities of gait and mobility (R26.89);Pain Pain - Right/Left: Right Pain - part of body: Ankle and joints of foot;Leg     Time: 1240-1305 PT Time Calculation (min) (ACUTE ONLY): 25 min  Charges:    $Gait Training: 8-22 mins $Therapeutic Exercise: 8-22 mins PT General Charges $$ ACUTE PT VISIT: 1 Visit                     Vickki Muff, PT, DPT   Acute Rehabilitation Department Office [phone] Secure Chat Communication Preferred   Ronnie Derby 10/07/2022, 3:28 PM

## 2022-10-07 NOTE — Progress Notes (Signed)
Progress Note   Patient: Stacy Kirk CZY:[id number] DOB: 1945-12-25 DOA: 09/30/2022     5 DOS: the patient was seen and examined on 10/07/2022   Brief hospital course: 77 y.o. female with medical history significant for T2DM, HTN, hypothyroidism, lumbar stenosis s/p L4-5 interbody fusion 2018, obesity who presented to the ED for evaluation after a fall at home. Patient states that she normally ambulates with a cane.  Her husband was trying to help her off the couch earlier today when she slipped and fell.  She landed on her bottom.  She developed significant posterior right hip pain.  She was unable to stand on her own.  EMS were called and she was brought to the ED for further evaluation.  She did not hit her head or lose consciousness.  She says pain is tolerable after she received IV morphine however still has not been able to stand with assistance.  Evaluation reviewed L2 compression fracture.  She was hospitalized for further management.   Assessment and Plan: Acute L2 compression fracture Detected on CT scan.  No other injuries noted on multiple imaging studies done in the emergency department.  C -ED PA discussed with neurosurgery who recommended TLSO brace and physical therapy. -This is also in the setting of previous L4-L5 spinal fusion surgery by Dr. Wynetta Emery in 2018. Physical therapy recommended SNF. Pt remains primarily bed bound at this time secondary to debility from acute fracture. -Continue analgesia. Discussed importance and purpose of SNF, pt seems to agree. TOC following for placement   Acute pulmonary embolism and RLE DVT -PE incidentally noted on CT abdomen and pelvis, confirmed on CTA chest. No previous history of same.  She was started on therapeutic Lovenox.  Lower extremity Doppler studies confirms RLE DVT  Echocardiogram does show a mildly reduced RV function. -Vital signs are stable.  -Continue eliquis   Chronic systolic CHF Echocardiogram shows diminished EF of 40  to 45%.  Global hypokinesis noted.  Moderate LVH noted.  Moderate aortic valve stenosis noted. No previous echocardiogram in our system or in care everywhere. Seems to be fairly euvolemic at this time. Could consider initiating ACE inhibitor/ARB.  However her blood pressures are noted to be low this morning.  Discontinued her diltiazem  Since she is otherwise relatively stable further management can be pursued in the outpatient setting with ambulatory referral to cardiology. EKG done on 7/5 was reviewed.   Transient altered mental status Had an episode of agitation on 7/7 which appears to have resolved on its own.  No focal neurological deficits.     Urinary tract infection -Patient had abdominal discomfort yesterday.  CT of the abdomen pelvis did not show any acute findings.  LFTs and lipase were normal a few days ago.  UA was done which was noted to be abnormal.  Could be the reason for her discomfort.   -Completed course of rocephin   Constipation Bowel regimen initiated.   Hypokalemia Repleted.   Diabetes mellitus type 2 On SSI.   Essential hypertension See discussion under CHF above.     Hypothyroidism Continue with levothyroxine.   Chronic steroid use Based on medication reconciliation as well as notes from her PCP as recently as May 2024 she is on prednisone 10 mg daily.  This was reinitiated.   Obesity Estimated body mass index is 36.3 kg/m as calculated from the following:   Height as of this encounter: 5' (1.524 m).   Weight as of this encounter: 84.3 kg.  Constipation -Despite BID miralax and BID senna -Continue BID colace -Continue with cathartics as needed     Subjective: Still complaining of back pains and feeling uncomfortable in bed  Physical Exam: Vitals:   10/06/22 2125 10/07/22 0530 10/07/22 0743 10/07/22 1508  BP: (!) 162/107 (!) 165/97 (!) 154/95 (!) 127/97  Pulse: 87 81 85 86  Resp: 18 18 17 17   Temp: 98.5 F (36.9 C) 98.2 F (36.8 C) 97.8  F (36.6 C) 98.3 F (36.8 C)  TempSrc: Oral Oral Oral Oral  SpO2: 94% 95% 95% 90%  Weight:      Height:       General exam: Awake, laying in bed, in nad Respiratory system: Normal respiratory effort, no wheezing Cardiovascular system: regular rate, s1, s2 Gastrointestinal system: Soft, nondistended, positive BS Central nervous system: CN2-12 grossly intact, strength intact Extremities: Perfused, no clubbing Skin: Normal skin turgor, no notable skin lesions seen Psychiatry: Mood normal // no visual hallucinations   Data Reviewed:  Labs reviewed: Na 137, K 3.6, Cr 0.93, WBC 6.6, Hgb 15.0  Family Communication: Pt in room, family not at bedside  Disposition: Status is: Inpatient Remains inpatient appropriate because: Severity of illness  Planned Discharge Destination: Skilled nursing facility     Author: Rickey Barbara, MD 10/07/2022 5:28 PM  For on call review [email or web address].

## 2022-10-07 NOTE — TOC Progression Note (Signed)
Transition of Care [facility]) - Progression Note    Patient Details  Name: Stacy Kirk MRN: [id number] Date of Birth: November 24, 1945  [facility]) CM/SW Contact  Erin Sons, [place] Phone Number: 10/07/2022, 2:06 PM  Clinical Narrative:     CSW provided SNF bed offers and medicare star ratings to pt. Pt states she is to groggy to review. Requests CSW discuss with her spouse.   CSW called pt's spouse and discussed SNF bed offers. Spouse is interested in [facility] as it is the closest to their home. CSW to confirm with [facility] and discuss with pt.   1330: [facility] confirmed they can offer bed. They cannot admit over the weekend. CSW spoke with pt who is agreeable to [facility]. Plan is for pt to DC to [facility] on Monday. Insurance Berkley Harvey will need to be started over the weekend.   Expected Discharge Plan: Skilled Nursing Facility   Expected Discharge Plan and Services                                   HH Arranged: PT, OT [facility] Agency: [facility] Date [facility] Agency Contacted: 10/03/22 Time HH Agency Contacted: [phone] Representative spoke with at [facility] Agency: [place]   Social Determinants of Health (SDOH) Interventions SDOH Screenings   Food Insecurity: No Food Insecurity (10/01/2022)  Housing: Low Risk  (10/01/2022)  Transportation Needs: No Transportation Needs (10/01/2022)  Utilities: Not At Risk (10/01/2022)  Tobacco Use: Medium Risk (09/30/2022)    Readmission Risk Interventions     No data to display

## 2022-10-08 DIAGNOSIS — S32020A Wedge compression fracture of second lumbar vertebra, initial encounter for closed fracture: Secondary | ICD-10-CM | POA: Diagnosis not present

## 2022-10-08 LAB — GLUCOSE, CAPILLARY
Glucose-Capillary: 111 mg/dL — ABNORMAL HIGH (ref 70–99)
Glucose-Capillary: 203 mg/dL — ABNORMAL HIGH (ref 70–99)
Glucose-Capillary: 193 mg/dL — ABNORMAL HIGH (ref 70–99)
Glucose-Capillary: 181 mg/dL — ABNORMAL HIGH (ref 70–99)

## 2022-10-08 MED ORDER — LOSARTAN POTASSIUM 50 MG PO TABS
25.0000 mg | ORAL_TABLET | Freq: Every day | ORAL | Status: DC
  Administered 2022-10-08 – 2022-10-10 (×3): 25 mg via ORAL
  Filled 2022-10-08 (×3): qty 1

## 2022-10-08 NOTE — Progress Notes (Signed)
Patient has not had a BM in about 5 days. States this can be "normal" for her. Declined dulcolax suppository. Offered soap suds enema but declined as well; stated she wanted to try prune juice first since that normally works for her at home. Prune juice given and awaiting effectiveness.

## 2022-10-08 NOTE — Progress Notes (Signed)
Progress Note   Patient: Stacy Kirk [id number] DOB: 1945/08/18 DOA: 09/30/2022     6 DOS: the patient was seen and examined on 10/08/2022   Brief hospital course: 77 y.o. female with medical history significant for T2DM, HTN, hypothyroidism, lumbar stenosis s/p L4-5 interbody fusion 2018, obesity who presented to the ED for evaluation after a fall at home. Patient states that she normally ambulates with a cane.  Her husband was trying to help her off the couch earlier today when she slipped and fell.  She landed on her bottom.  She developed significant posterior right hip pain.  She was unable to stand on her own.  EMS were called and she was brought to the ED for further evaluation.  She did not hit her head or lose consciousness.  She says pain is tolerable after she received IV morphine however still has not been able to stand with assistance.  Evaluation reviewed L2 compression fracture.  She was hospitalized for further management.   Assessment and Plan: Acute L2 compression fracture Detected on CT scan.  No other injuries noted on multiple imaging studies done in the emergency department.  C -ED PA discussed with neurosurgery who recommended TLSO brace and physical therapy. -This is also in the setting of previous L4-L5 spinal fusion surgery by Dr. Wynetta Emery in 2018. Physical therapy recommended SNF. Pt remains primarily bed bound at this time secondary to debility from acute fracture. -Continue analgesia. Discussed importance and purpose of SNF, pt seems to agree. TOC continues to follow   Acute pulmonary embolism and RLE DVT -PE incidentally noted on CT abdomen and pelvis, confirmed on CTA chest. No previous history of same.  She was started on therapeutic Lovenox.  Lower extremity Doppler studies confirms RLE DVT  Echocardiogram does show a mildly reduced RV function. -Vital signs are stable.  -Continue eliquis   Chronic systolic CHF Echocardiogram shows diminished EF of 40 to  45%.  Global hypokinesis noted.  Moderate LVH noted.  Moderate aortic valve stenosis noted. No previous echocardiogram in our system or in care everywhere. Seems to be fairly euvolemic at this time. Could consider initiating ACE inhibitor/ARB.  However her blood pressures are noted to be low this morning.  Discontinued her diltiazem  Since she is otherwise relatively stable further management can be pursued in the outpatient setting with ambulatory referral to cardiology. EKG done on 7/5 was reviewed.   Transient altered mental status Had an episode of agitation on 7/7 which appears to have resolved on its own.  No focal neurological deficits.     Urinary tract infection -Patient had abdominal discomfort yesterday.  CT of the abdomen pelvis did not show any acute findings.  LFTs and lipase were normal a few days ago.  UA was done which was noted to be abnormal.  Could be the reason for her discomfort.   -Completed course of rocephin   Constipation Bowel regimen initiated.   Hypokalemia Repleted.   Diabetes mellitus type 2 On SSI.   Essential hypertension See discussion under CHF above.     Hypothyroidism Continue with levothyroxine.   Chronic steroid use Based on medication reconciliation as well as notes from her PCP as recently as May 2024 she is on prednisone 10 mg daily.  This was reinitiated.   Obesity Estimated body mass index is 36.3 kg/m as calculated from the following:   Height as of this encounter: 5' (1.524 m).   Weight as of this encounter: 84.3 kg.  Constipation -Despite BID miralax and BID senna -Continue BID colace -recommend trial of soap suds. Still no BM     Subjective: Without complaints. Eating hamburger  Physical Exam: Vitals:   10/08/22 0519 10/08/22 0845 10/08/22 1523 10/08/22 1809  BP:  (!) 177/113 (!) 164/104 (!) 157/78  Pulse:  95 92 94  Resp:  19 18 17   Temp:  98.4 F (36.9 C)  98.8 F (37.1 C)  TempSrc:    Oral  SpO2:  95% 94% 97%   Weight: 86.4 kg     Height:       General exam: Conversant, in no acute distress Respiratory system: normal chest rise, clear, no audible wheezing Cardiovascular system: regular rhythm, s1-s2 Gastrointestinal system: Nondistended, nontender, pos BS Central nervous system: No seizures, no tremors Extremities: No cyanosis, no joint deformities Skin: No rashes, no pallor Psychiatry: Affect normal // no auditory hallucinations   Data Reviewed:  There are no new results to review at this time.  Family Communication: Pt in room, family not at bedside  Disposition: Status is: Inpatient Remains inpatient appropriate because: Severity of illness  Planned Discharge Destination: Skilled nursing facility     Author: Rickey Barbara, MD 10/08/2022 6:15 PM  For on call review [email or web address].

## 2022-10-09 DIAGNOSIS — S32029A Unspecified fracture of second lumbar vertebra, initial encounter for closed fracture: Secondary | ICD-10-CM | POA: Diagnosis not present

## 2022-10-09 DIAGNOSIS — S32020A Wedge compression fracture of second lumbar vertebra, initial encounter for closed fracture: Secondary | ICD-10-CM | POA: Diagnosis not present

## 2022-10-09 LAB — CBC
HCT: 45.4 % (ref 36.0–46.0)
Hemoglobin: 14.8 g/dL (ref 12.0–15.0)
MCH: 30.3 pg (ref 26.0–34.0)
MCHC: 32.6 g/dL (ref 30.0–36.0)
MCV: 92.8 fL (ref 80.0–100.0)
Platelets: 226 10*3/uL (ref 150–400)
RBC: 4.89 MIL/uL (ref 3.87–5.11)
RDW: 14.3 % (ref 11.5–15.5)
WBC: 7.7 10*3/uL (ref 4.0–10.5)
nRBC: 0 % (ref 0.0–0.2)

## 2022-10-09 LAB — COMPREHENSIVE METABOLIC PANEL
ALT: 52 U/L — ABNORMAL HIGH (ref 0–44)
AST: 29 U/L (ref 15–41)
Albumin: 3 g/dL — ABNORMAL LOW (ref 3.5–5.0)
Alkaline Phosphatase: 48 U/L (ref 38–126)
Anion gap: 8 (ref 5–15)
BUN: 16 mg/dL (ref 8–23)
CO2: 28 mmol/L (ref 22–32)
Calcium: 9 mg/dL (ref 8.9–10.3)
Chloride: 99 mmol/L (ref 98–111)
Creatinine, Ser: 0.91 mg/dL (ref 0.44–1.00)
GFR, Estimated: >60 mL/min (ref >60)
Glucose, Bld: 97 mg/dL (ref 70–99)
Potassium: 3.7 mmol/L (ref 3.5–5.1)
Sodium: 135 mmol/L (ref 135–145)
Total Bilirubin: 0.8 mg/dL (ref 0.3–1.2)
Total Protein: 5.6 g/dL — ABNORMAL LOW (ref 6.5–8.1)

## 2022-10-09 LAB — GLUCOSE, CAPILLARY
Glucose-Capillary: 106 mg/dL — ABNORMAL HIGH (ref 70–99)
Glucose-Capillary: 151 mg/dL — ABNORMAL HIGH (ref 70–99)
Glucose-Capillary: 141 mg/dL — ABNORMAL HIGH (ref 70–99)
Glucose-Capillary: 98 mg/dL (ref 70–99)

## 2022-10-09 MED ORDER — DIPHENHYDRAMINE HCL 25 MG PO CAPS
25.0000 mg | ORAL_CAPSULE | Freq: Three times a day (TID) | ORAL | Status: DC | PRN
  Administered 2022-10-09: 25 mg via ORAL
  Filled 2022-10-09: qty 1

## 2022-10-09 MED ORDER — DIPHENHYDRAMINE HCL 50 MG/ML IJ SOLN: 12.5000 mg | Freq: Three times a day (TID) | INTRAMUSCULAR | Status: DC | PRN

## 2022-10-09 MED ORDER — HYDROCORTISONE 1 % EX CREA
1.0000 | TOPICAL_CREAM | CUTANEOUS | Status: DC | PRN
  Filled 2022-10-09: qty 28

## 2022-10-09 NOTE — TOC Progression Note (Signed)
Transition of Care [facility]) - Progression Note    Patient Details  Name: Stacy Kirk MRN: [id number] Date of Birth: [birth year]  Transition of Care [facility]) CM/SW Contact  Jimmy Picket, [place] Phone Number: 10/09/2022, 4:35 PM  Clinical Narrative:     [employer] Harvey started. Status approved. Auth ID [id number]. Reference ID [id number]. 10/09/22-10/12/22   Expected Discharge Plan: Skilled Nursing Facility Barriers to Discharge: No Barriers Identified  Expected Discharge Plan and Services                                   HH Arranged: PT, OT [facility] Agency: [facility] Date [facility] Agency Contacted: 10/03/22 Time HH Agency Contacted: [phone] Representative spoke with at [facility] Agency: [place]   Social Determinants of Health (SDOH) Interventions SDOH Screenings   Food Insecurity: No Food Insecurity (10/01/2022)  Housing: Low Risk  (10/01/2022)  Transportation Needs: No Transportation Needs (10/01/2022)  Utilities: Not At Risk (10/01/2022)  Tobacco Use: Medium Risk (09/30/2022)    Readmission Risk Interventions     No data to display

## 2022-10-09 NOTE — Progress Notes (Signed)
Progress Note   Patient: Stacy Kirk ZOX:[id number] DOB: 1945/07/09 DOA: 09/30/2022     7 DOS: the patient was seen and examined on 10/09/2022   Brief hospital course: 77 y.o. female with medical history significant for T2DM, HTN, hypothyroidism, lumbar stenosis s/p L4-5 interbody fusion 2018, obesity who presented to the ED for evaluation after a fall at home. Patient states that she normally ambulates with a cane.  Her husband was trying to help her off the couch earlier today when she slipped and fell.  She landed on her bottom.  She developed significant posterior right hip pain.  She was unable to stand on her own.  EMS were called and she was brought to the ED for further evaluation.  She did not hit her head or lose consciousness.  She says pain is tolerable after she received IV morphine however still has not been able to stand with assistance.  Evaluation reviewed L2 compression fracture.  She was hospitalized for further management.   Assessment and Plan: Acute L2 compression fracture Detected on CT scan.  No other injuries noted on multiple imaging studies done in the emergency department.  C -ED PA discussed with neurosurgery who recommended TLSO brace and physical therapy. -This is also in the setting of previous L4-L5 spinal fusion surgery by Dr. Wynetta Emery in 2018. Physical therapy recommended SNF. Pt remains primarily bed bound at this time secondary to debility from acute fracture. -Continue analgesia. Discussed importance and purpose of SNF, pt agrees. Plan for SNF tomorrow. Insurance auth completed today   Acute pulmonary embolism and RLE DVT -PE incidentally noted on CT abdomen and pelvis, confirmed on CTA chest. No previous history of same.  She was started on therapeutic Lovenox.  Lower extremity Doppler studies confirms RLE DVT  Echocardiogram does show a mildly reduced RV function. -Vital signs are stable.  -Continue eliquis   Chronic systolic CHF Echocardiogram shows  diminished EF of 40 to 45%.  Global hypokinesis noted.  Moderate LVH noted.  Moderate aortic valve stenosis noted. No previous echocardiogram in our system or in care everywhere. Seems to be fairly euvolemic at this time. Could consider initiating ACE inhibitor/ARB.  However her blood pressures are noted to be low this morning.  Discontinued her diltiazem  Since she is otherwise relatively stable further management can be pursued in the outpatient setting with ambulatory referral to cardiology. EKG done on 7/5 was reviewed.   Transient altered mental status Had an episode of agitation on 7/7 which appears to have resolved on its own.  No focal neurological deficits.     Urinary tract infection -Patient had abdominal discomfort yesterday.  CT of the abdomen pelvis did not show any acute findings.  LFTs and lipase were normal a few days ago.  UA was done which was noted to be abnormal.  Could be the reason for her discomfort.   -Completed course of rocephin   Constipation BM noted today, continue cathartics   Hypokalemia Repleted.   Diabetes mellitus type 2 On SSI.   Essential hypertension See discussion under CHF above.     Hypothyroidism Continue with levothyroxine.   Chronic steroid use Based on medication reconciliation as well as notes from her PCP as recently as May 2024 she is on prednisone 10 mg daily.  This was reinitiated.   Obesity Estimated body mass index is 36.3 kg/m as calculated from the following:   Height as of this encounter: 5' (1.524 m).   Weight as of this encounter:  84.3 kg.      Subjective: Without complaints today  Physical Exam: Vitals:   10/08/22 1922 10/09/22 0615 10/09/22 0803 10/09/22 1500  BP: (!) 156/74 127/84 (!) 141/89 (!) 156/74  Pulse: 90 (!) 106 94 90  Resp: 16  17 17   Temp: 98.7 F (37.1 C) 98 F (36.7 C) 97.9 F (36.6 C) 98.9 F (37.2 C)  TempSrc: Oral   Oral  SpO2: 95% 100% 91% 99%  Weight:      Height:       General  exam: Awake, laying in bed, in nad Respiratory system: Normal respiratory effort, no wheezing Cardiovascular system: regular rate, s1, s2 Gastrointestinal system: Soft, nondistended, positive BS Central nervous system: CN2-12 grossly intact, strength intact Extremities: Perfused, no clubbing Skin: Normal skin turgor, no notable skin lesions seen Psychiatry: Mood normal // no visual hallucinations   Data Reviewed:  Labs reviewed: Na 135, K 3.7, Cr 0.91, WBC 7.7, Hgb 14.8  Family Communication: Pt in room, family not at bedside  Disposition: Status is: Inpatient Remains inpatient appropriate because: Severity of illness  Planned Discharge Destination: Skilled nursing facility    Author: Rickey Barbara, MD 10/09/2022 5:00 PM  For on call review [email or web address].

## 2022-10-10 DIAGNOSIS — Z743 Need for continuous supervision: Secondary | ICD-10-CM | POA: Diagnosis not present

## 2022-10-10 DIAGNOSIS — Z86711 Personal history of pulmonary embolism: Secondary | ICD-10-CM | POA: Diagnosis not present

## 2022-10-10 DIAGNOSIS — R278 Other lack of coordination: Secondary | ICD-10-CM | POA: Diagnosis not present

## 2022-10-10 DIAGNOSIS — R059 Cough, unspecified: Secondary | ICD-10-CM | POA: Diagnosis not present

## 2022-10-10 DIAGNOSIS — I10 Essential (primary) hypertension: Secondary | ICD-10-CM | POA: Diagnosis not present

## 2022-10-10 DIAGNOSIS — S32020A Wedge compression fracture of second lumbar vertebra, initial encounter for closed fracture: Secondary | ICD-10-CM | POA: Diagnosis not present

## 2022-10-10 DIAGNOSIS — R051 Acute cough: Secondary | ICD-10-CM | POA: Diagnosis not present

## 2022-10-10 DIAGNOSIS — E1165 Type 2 diabetes mellitus with hyperglycemia: Secondary | ICD-10-CM | POA: Diagnosis not present

## 2022-10-10 DIAGNOSIS — I110 Hypertensive heart disease with heart failure: Secondary | ICD-10-CM | POA: Diagnosis not present

## 2022-10-10 DIAGNOSIS — I82401 Acute embolism and thrombosis of unspecified deep veins of right lower extremity: Secondary | ICD-10-CM | POA: Diagnosis not present

## 2022-10-10 DIAGNOSIS — Z9181 History of falling: Secondary | ICD-10-CM | POA: Diagnosis not present

## 2022-10-10 DIAGNOSIS — M25562 Pain in left knee: Secondary | ICD-10-CM | POA: Diagnosis not present

## 2022-10-10 DIAGNOSIS — S32009A Unspecified fracture of unspecified lumbar vertebra, initial encounter for closed fracture: Secondary | ICD-10-CM | POA: Diagnosis not present

## 2022-10-10 DIAGNOSIS — S32000D Wedge compression fracture of unspecified lumbar vertebra, subsequent encounter for fracture with routine healing: Secondary | ICD-10-CM | POA: Diagnosis not present

## 2022-10-10 DIAGNOSIS — S32029A Unspecified fracture of second lumbar vertebra, initial encounter for closed fracture: Secondary | ICD-10-CM | POA: Diagnosis not present

## 2022-10-10 DIAGNOSIS — R296 Repeated falls: Secondary | ICD-10-CM | POA: Diagnosis not present

## 2022-10-10 DIAGNOSIS — M6281 Muscle weakness (generalized): Secondary | ICD-10-CM | POA: Diagnosis not present

## 2022-10-10 DIAGNOSIS — R2689 Other abnormalities of gait and mobility: Secondary | ICD-10-CM | POA: Diagnosis not present

## 2022-10-10 DIAGNOSIS — S32000A Wedge compression fracture of unspecified lumbar vertebra, initial encounter for closed fracture: Secondary | ICD-10-CM | POA: Diagnosis not present

## 2022-10-10 LAB — GLUCOSE, CAPILLARY
Glucose-Capillary: 94 mg/dL (ref 70–99)
Glucose-Capillary: 143 mg/dL — ABNORMAL HIGH (ref 70–99)

## 2022-10-10 MED ORDER — POLYETHYLENE GLYCOL 3350 17 G PO PACK: 17.0000 g | PACK | Freq: Two times a day (BID) | ORAL | 0 refills | Status: DC

## 2022-10-10 MED ORDER — APIXABAN 5 MG PO TABS: ORAL_TABLET | ORAL | 0 refills | Status: DC

## 2022-10-10 MED ORDER — HYDROCODONE-ACETAMINOPHEN 5-325 MG PO TABS: 1.0000 | ORAL_TABLET | ORAL | 0 refills | Status: DC | PRN

## 2022-10-10 MED ORDER — LOSARTAN POTASSIUM 25 MG PO TABS: 25.0000 mg | ORAL_TABLET | Freq: Every day | ORAL | 0 refills | Status: DC

## 2022-10-10 MED ORDER — SENNOSIDES-DOCUSATE SODIUM 8.6-50 MG PO TABS: 2.0000 | ORAL_TABLET | Freq: Two times a day (BID) | ORAL | 0 refills | Status: DC

## 2022-10-10 NOTE — Discharge Summary (Signed)
Physician Discharge Summary   Patient: Stacy Kirk MRN: [id number] DOB: 17-Nov-1945  Admit date:     09/30/2022  Discharge date: 10/10/22  Discharge Physician: Rickey Barbara   PCP: Eden Emms, NP   Recommendations at discharge:    Follow up with PCP in 1-2 weeks  Discharge Diagnoses: Principal Problem:   Closed compression fracture of L2 lumbar vertebra, initial encounter [facility]) Active Problems:   Controlled type 2 diabetes mellitus with complication, without long-term current use of insulin (HCC)   Hypothyroidism   Hypokalemia   Hypertension   Lumbar compression fracture (HCC)  Resolved Problems:   * No resolved hospital problems. [facility] Course: 77 y.o. female with medical history significant for T2DM, HTN, hypothyroidism, lumbar stenosis s/p L4-5 interbody fusion 2018, obesity who presented to the ED for evaluation after a fall at home. Patient states that she normally ambulates with a cane.  Her husband was trying to help her off the couch earlier today when she slipped and fell.  She landed on her bottom.  She developed significant posterior right hip pain.  She was unable to stand on her own.  EMS were called and she was brought to the ED for further evaluation.  She did not hit her head or lose consciousness.  She says pain is tolerable after she received IV morphine however still has not been able to stand with assistance.  Evaluation reviewed L2 compression fracture.  She was hospitalized for further management.   Assessment and Plan: Acute L2 compression fracture -Detected on CT scan.  No other injuries noted on multiple imaging studies done in the emergency department.  -ED PA discussed with neurosurgery who recommended TLSO brace and physical therapy. -This is also in the setting of previous L4-L5 spinal fusion surgery by Dr. Wynetta Emery in 2018. Physical therapy recommended SNF. Pt remains primarily bed bound at this time secondary to debility from acute  fracture. -Continue analgesia. Discussed importance and purpose of SNF, pt agrees. Plan for SNF    Acute pulmonary embolism and RLE DVT -PE incidentally noted on CT abdomen and pelvis, confirmed on CTA chest. No previous history of same.  She was started on therapeutic Lovenox.  Lower extremity Doppler studies confirms RLE DVT  Echocardiogram does show a mildly reduced RV function. -Vital signs are stable.  -Continue eliquis   Chronic systolic CHF Echocardiogram shows diminished EF of 40 to 45%.  Global hypokinesis noted.  Moderate LVH noted.  Moderate aortic valve stenosis noted. No previous echocardiogram in our system or in care everywhere. Seems to be fairly euvolemic at this time. Could consider initiating ACE inhibitor/ARB.  However her blood pressures are noted to be low this morning.  Discontinued her diltiazem  Since she is otherwise relatively stable further management can be pursued in the outpatient setting with ambulatory referral to cardiology. EKG done on 7/5 was reviewed.   Transient altered mental status Had an episode of agitation on 7/7 which appears to have resolved on its own.  No focal neurological deficits.     Urinary tract infection -Patient had abdominal discomfort yesterday.  CT of the abdomen pelvis did not show any acute findings.  LFTs and lipase were normal a few days ago.  UA was done which was noted to be abnormal.  Could be the reason for her discomfort.   -Completed course of rocephin   Constipation BM noted 7/14  Hypokalemia Repleted.   Diabetes mellitus type 2 On SSI.   Essential hypertension See  discussion under CHF above.     Hypothyroidism Continue with levothyroxine.   Chronic steroid use Based on medication reconciliation as well as notes from her PCP as recently as May 2024 she is on prednisone 10 mg daily.  This was reinitiated.   Obesity Estimated body mass index is 36.3 kg/m as calculated from the following:   Height as of  this encounter: 5' (1.524 m).   Weight as of this encounter: 84.3 kg.       Consultants:  Procedures performed:   Disposition: Skilled nursing facility Diet recommendation:  Cardiac and Carb modified diet DISCHARGE MEDICATION: Allergies as of 10/10/2022       Reactions   Erythromycin Other (See Comments)   "tears" her stomach up. Major digestive upset.   Tramadol Shortness Of Breath, Other (See Comments)   Felt like lungs filled up; unable to lay down   Albumin (human) Other (See Comments)   Pt unsure of reaction   Aspirin Itching, Other (See Comments)   Cause lethargy   Codeine Itching   Cause lethargy   Cortisone Other (See Comments)   NO STEROID INJECTIONS; headaches   Propoxyphene Other (See Comments)   Terrible reaction; headaches   Simvastatin Itching, Other (See Comments)   Severe muscle aches   Triamcinolone Acetonide Other (See Comments)   headache        Medication List     TAKE these medications    acetaminophen 500 MG tablet Commonly known as: TYLENOL Take 1,000 mg by mouth every 6 (six) hours as needed (FOR PAIN.).   apixaban 5 MG Tabs tablet Commonly known as: ELIQUIS Take 2 tablets (10 mg total) by mouth 2 (two) times daily for 2 days, THEN 1 tablet (5 mg total) 2 (two) times daily. Start taking on: October 10, 2022   baclofen 20 MG tablet Commonly known as: LIORESAL Take 0.5 tablets (10 mg total) by mouth daily as needed for muscle spasms. What changed: how much to take   BENGAY EX Apply 1 application topically 4 (four) times daily as needed (for back pain).   CORTIZONE-10 EX Apply 1 application topically daily as needed (for itchy skin).   diltiazem 120 MG 24 hr capsule Commonly known as: CARDIZEM CD Take 120 mg by mouth daily.   docusate sodium 100 MG capsule Commonly known as: COLACE Take 100 mg by mouth daily at 8 pm.   HAIR/SKIN/NAILS PO Take 1 capsule by mouth daily. HEALTHY HAIR,SKIN & NAILS   HYDROcodone-acetaminophen 5-325  MG tablet Commonly known as: NORCO/VICODIN Take 1-2 tablets by mouth every 4 (four) hours as needed for moderate pain.   ibuprofen 200 MG tablet Commonly known as: ADVIL Take 400 mg by mouth every 8 (eight) hours as needed (FOR PAIN).   levothyroxine 100 MCG tablet Commonly known as: SYNTHROID Take 100 mcg by mouth daily before breakfast.   linaclotide 72 MCG capsule Commonly known as: Linzess Take 1 capsule (72 mcg total) by mouth daily before breakfast.   losartan 25 MG tablet Commonly known as: COZAAR Take 1 tablet (25 mg total) by mouth daily. Start taking on: October 11, 2022   magnesium gluconate 500 MG tablet Commonly known as: MAGONATE Take 500 mg by mouth daily.   metFORMIN 500 MG tablet Commonly known as: GLUCOPHAGE Take 1 tablet (500 mg total) by mouth daily after supper. What changed: when to take this   multivitamin with minerals Tabs tablet Take 1 tablet by mouth daily. ESSENTIAL ONE SUPPLEMENT   polyethylene glycol  17 g packet Commonly known as: MIRALAX / GLYCOLAX Take 17 g by mouth 2 (two) times daily.   predniSONE 10 MG tablet Commonly known as: DELTASONE Take 1 tablet (10 mg total) by mouth daily with breakfast. What changed: when to take this   senna-docusate 8.6-50 MG tablet Commonly known as: Senokot-S Take 2 tablets by mouth 2 (two) times daily.        Discharge Exam: Filed Weights   09/30/22 2352 10/08/22 0519  Weight: 84.3 kg 86.4 kg   General exam: Awake, laying in bed, in nad Respiratory system: Normal respiratory effort, no wheezing Cardiovascular system: regular rate, s1, s2 Gastrointestinal system: Soft, nondistended, positive BS Central nervous system: CN2-12 grossly intact, strength intact Extremities: Perfused, no clubbing Skin: Normal skin turgor, no notable skin lesions seen Psychiatry: Mood normal // no visual hallucinations   Condition at discharge: fair  The results of significant diagnostics from this hospitalization  (including imaging, microbiology, ancillary and laboratory) are listed below for reference.   Imaging Studies: DG Ankle Complete Left  Result Date: 10/05/2022 CLINICAL DATA:  Ankle pain. EXAM: LEFT ANKLE COMPLETE - 3+ VIEW COMPARISON:  None Available. FINDINGS: There is no evidence of fracture, dislocation, or joint effusion. Ankle mortise is preserved. No erosive change. There is a plantar calcaneal spur. There is no evidence of arthropathy or other focal bone abnormality. Soft tissues are unremarkable. IMPRESSION: Plantar calcaneal spur. Otherwise negative radiographs of the left ankle. Electronically Signed   By: Narda Rutherford M.D.   On: 10/05/2022 15:03   CT Angio Chest Pulmonary Embolism (PE) W or WO Contrast  Result Date: 10/04/2022 CLINICAL DATA:  Concern for pulmonary embolism. EXAM: CT ANGIOGRAPHY CHEST WITH CONTRAST TECHNIQUE: Multidetector CT imaging of the chest was performed using the standard protocol during bolus administration of intravenous contrast. Multiplanar CT image reconstructions and MIPs were obtained to evaluate the vascular anatomy. RADIATION DOSE REDUCTION: This exam was performed according to the departmental dose-optimization program which includes automated exposure control, adjustment of the mA and/or kV according to patient size and/or use of iterative reconstruction technique. CONTRAST:  75mL OMNIPAQUE IOHEXOL 350 MG/ML SOLN COMPARISON:  Chest radiograph dated 09/30/2022. FINDINGS: Cardiovascular: There is mild cardiomegaly. No pericardial effusion. Moderate calcified and noncalcified plaque of the thoracic aorta. No aneurysmal dilatation or dissection. The origins of the great vessels of the aortic arch appear patent. Right lower lobe segmental pulmonary artery embolus. No CT evidence of right heart straining. Mediastinum/Nodes: No hilar or mediastinal adenopathy. Small hiatal hernia. The esophagus is grossly unremarkable. No mediastinal fluid collection. Lungs/Pleura:  Minimal bibasilar subpleural atelectasis/scarring. There is no pleural effusion or pneumothorax. 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 The central airways are patent. Upper Abdomen: Cholecystectomy. Musculoskeletal: Osteopenia with degenerative changes of the spine. Acute appearing compression fracture of superior endplate of L1 (corresponds to the fracture numbered L2 on the lumbar spine CT of 09/30/2022). Review of the MIP images confirms the above findings. IMPRESSION: 1. Right lower lobe segmental pulmonary artery embolus. No CT evidence of right heart straining. 2. Acute appearing compression fracture of superior endplate of L1. 3.  Aortic Atherosclerosis (ICD10-I70.0). These results were called by telephone at the time of interpretation on 10/04/2022 at 7:24 pm to DR [facility], who verbally acknowledged these results. Electronically Signed   By: Elgie Collard M.D.   On: 10/04/2022 19:48   VAS [place] LOWER EXTREMITY VENOUS (DVT)  Result Date: 10/04/2022  Lower Venous DVT Study Patient Name:  Stacy Kirk  Date of Exam:  10/04/2022 Medical Rec #: [id number]           Accession #:    [id number] Date of Birth: 11/11/1945           Patient Gender: F Patient Age:   22 years Exam Location[facility]  [facility] Procedure:      VAS [place] LOWER EXTREMITY VENOUS (DVT) Referring Phys: Osvaldo Shipper --------------------------------------------------------------------------------  Indications: Pain.  Comparison Study: No previous study. Performing Technologist: McKayla Maag RVT, [place]  Examination Guidelines: A complete evaluation includes B-mode imaging, spectral Doppler, color Doppler, and power Doppler as needed of all accessible portions of each vessel. Bilateral testing is considered an integral part of a complete examination. Limited examinations for reoccurring indications may be performed as noted. The reflux portion of the exam is performed with the patient in reverse Trendelenburg.   +---------+---------------+---------+-----------+----------+--------------+ RIGHT    CompressibilityPhasicitySpontaneityPropertiesThrombus Aging +---------+---------------+---------+-----------+----------+--------------+ CFV      Full           Yes      Yes                                 +---------+---------------+---------+-----------+----------+--------------+ SFJ      Full                                                        +---------+---------------+---------+-----------+----------+--------------+ FV Prox  Full                                                        +---------+---------------+---------+-----------+----------+--------------+ FV Mid   Full                                                        +---------+---------------+---------+-----------+----------+--------------+ FV DistalFull                                                        +---------+---------------+---------+-----------+----------+--------------+ PFV      Full                                                        +---------+---------------+---------+-----------+----------+--------------+ POP      Full           Yes      Yes                                 +---------+---------------+---------+-----------+----------+--------------+ PTV      Full                                                        +---------+---------------+---------+-----------+----------+--------------+  PERO     Full                                                        +---------+---------------+---------+-----------+----------+--------------+ Gastroc  None           No       No                   Acute          +---------+---------------+---------+-----------+----------+--------------+   +---------+---------------+---------+-----------+----------+--------------+ LEFT     CompressibilityPhasicitySpontaneityPropertiesThrombus Aging  +---------+---------------+---------+-----------+----------+--------------+ CFV      Full           Yes      Yes                                 +---------+---------------+---------+-----------+----------+--------------+ SFJ      Full                                                        +---------+---------------+---------+-----------+----------+--------------+ FV Prox  Full                                                        +---------+---------------+---------+-----------+----------+--------------+ FV Mid   Full                                                        +---------+---------------+---------+-----------+----------+--------------+ FV DistalFull                                                        +---------+---------------+---------+-----------+----------+--------------+ PFV      Full                                                        +---------+---------------+---------+-----------+----------+--------------+ POP      Full           Yes      Yes                                 +---------+---------------+---------+-----------+----------+--------------+ PTV      Full                                                        +---------+---------------+---------+-----------+----------+--------------+  PERO     Full                                                        +---------+---------------+---------+-----------+----------+--------------+     Summary: RIGHT: - Findings consistent with acute deep vein thrombosis involving the right gastrocnemius veins. - No cystic structure found in the popliteal fossa.  LEFT: - There is no evidence of deep vein thrombosis in the lower extremity.  - No cystic structure found in the popliteal fossa.  *See table(s) above for measurements and observations. Electronically signed by Lemar Livings MD on 10/04/2022 at 2:40:34 PM.    Final    ECHOCARDIOGRAM COMPLETE  Result Date: 10/04/2022    ECHOCARDIOGRAM  REPORT   Patient Name:   Stacy Kirk Date of Exam: 10/04/2022 Medical Rec #:  [id number]          Height:       60.0 in Accession #:    [id number]         Weight:       185.8 lb Date of Birth:  1945-05-10          BSA:          1.809 m Patient Age:    77 years           BP:           110/87 mmHg Patient Gender: F                  HR:           88 bpm. Exam Location:  Inpatient Procedure: 2D Echo, Cardiac Doppler and Color Doppler Indications:    pulmonary embolus  History:        Patient has no prior history of Echocardiogram examinations.                 Risk Factors:Diabetes and Hypertension.  Sonographer:    Delcie Roch RDCS Referring Phys: [id number] Osvaldo Shipper IMPRESSIONS  1. Left ventricular ejection fraction, by estimation, is 40 to 45%. The left ventricle has mildly decreased function. The left ventricle demonstrates global hypokinesis. There is moderate concentric left ventricular hypertrophy. Left ventricular diastolic parameters are consistent with Grade I diastolic dysfunction (impaired relaxation).  2. Right ventricular systolic function is mildly reduced. The right ventricular size is normal. Mildly increased right ventricular wall thickness. Tricuspid regurgitation signal is inadequate for assessing PA pressure.  3. Left atrial size was mildly dilated.  4. The mitral valve is degenerative. No evidence of mitral valve regurgitation. No evidence of mitral stenosis. Moderate mitral annular calcification.  5. The aortic valve is tricuspid. There is mild calcification of the aortic valve. There is moderate thickening of the aortic valve. Aortic valve regurgitation is not visualized. Moderate aortic valve stenosis.  6. There is borderline dilatation of the ascending aorta, measuring 38 mm.  7. The inferior vena cava is normal in size with greater than 50% respiratory variability, suggesting right atrial pressure of 3 mmHg. Conclusion(s)/Recommendation(s): Consider infiltrative cardiomyopathy  (amyloidosis) if there clinical reasons to suspect this disorder. Moderate low flow-low gradient aortic stenosis. FINDINGS  Left Ventricle: Left ventricular ejection fraction, by estimation, is 40 to 45%. The left ventricle has mildly decreased function. The left ventricle demonstrates global hypokinesis. The left ventricular internal cavity size was normal in  size. There is  moderate concentric left ventricular hypertrophy. Left ventricular diastolic parameters are consistent with Grade I diastolic dysfunction (impaired relaxation). Right Ventricle: The right ventricular size is normal. Mildly increased right ventricular wall thickness. Right ventricular systolic function is mildly reduced. Tricuspid regurgitation signal is inadequate for assessing PA pressure. Left Atrium: Left atrial size was mildly dilated. Right Atrium: Right atrial size was normal in size. Pericardium: There is no evidence of pericardial effusion. Presence of epicardial fat layer. Mitral Valve: The mitral valve is degenerative in appearance. There is mild thickening of the anterior and posterior mitral valve leaflet(s). Moderate mitral annular calcification. No evidence of mitral valve regurgitation. No evidence of mitral valve stenosis. Tricuspid Valve: The tricuspid valve is grossly normal. Tricuspid valve regurgitation is not demonstrated. Aortic Valve: The aortic valve is tricuspid. There is mild calcification of the aortic valve. There is moderate thickening of the aortic valve. Aortic valve regurgitation is not visualized. Moderate aortic stenosis is present. Aortic valve mean gradient measures 12.0 mmHg. Aortic valve peak gradient measures 21.0 mmHg. Aortic valve area, by VTI measures 1.15 cm. Pulmonic Valve: The pulmonic valve was normal in structure. Pulmonic valve regurgitation is not visualized. No evidence of pulmonic stenosis. Aorta: The aortic root is normal in size and structure. There is borderline dilatation of the ascending  aorta, measuring 38 mm. Venous: The inferior vena cava is normal in size with greater than 50% respiratory variability, suggesting right atrial pressure of 3 mmHg. IAS/Shunts: No atrial level shunt detected by color flow Doppler.  LEFT VENTRICLE PLAX 2D LVIDd:         4.40 cm     Diastology LVIDs:         3.20 cm     LV e' medial:    2.94 cm/s LV PW:         1.60 cm     LV E/e' medial:  11.9 LV IVS:        1.70 cm     LV e' lateral:   3.70 cm/s LVOT diam:     1.99 cm     LV E/e' lateral: 9.4 LV SV:         43 LV SV Index:   24 LVOT Area:     3.11 cm  LV Volumes (MOD) LV vol d, MOD A2C: 68.4 ml LV vol d, MOD A4C: 70.1 ml LV vol s, MOD A2C: 45.6 ml LV vol s, MOD A4C: 41.6 ml LV SV MOD A2C:     22.8 ml LV SV MOD A4C:     70.1 ml LV SV MOD BP:      27.9 ml RIGHT VENTRICLE            IVC RV S prime:     8.27 cm/s  IVC diam: 1.20 cm LEFT ATRIUM             Index        RIGHT ATRIUM           Index LA diam:        3.20 cm 1.77 cm/m   RA Area:     11.10 cm LA Vol (A2C):   51.6 ml 28.52 ml/m  RA Volume:   21.70 ml  11.99 ml/m LA Vol (A4C):   53.0 ml 29.29 ml/m LA Biplane Vol: 53.5 ml 29.57 ml/m  AORTIC VALVE AV Area (Vmax):    1.13 cm AV Area (Vmean):   1.08 cm AV Area (VTI):  1.15 cm AV Vmax:           229.00 cm/s AV Vmean:          157.000 cm/s AV VTI:            0.369 m AV Peak Grad:      21.0 mmHg AV Mean Grad:      12.0 mmHg LVOT Vmax:         83.05 cm/s LVOT Vmean:        54.700 cm/s LVOT VTI:          0.137 m LVOT/AV VTI ratio: 0.37  AORTA Ao Root diam: 3.60 cm Ao Asc diam:  3.80 cm MV E velocity: 34.96 cm/s MV A velocity: 85.81 cm/s  SHUNTS MV E/A ratio:  0.41        Systemic VTI:  0.14 m                            Systemic Diam: 1.99 cm Rachelle Hora Croitoru MD Electronically signed by Thurmon Fair MD Signature Date/Time: 10/04/2022/10:45:46 AM    Final    CT ABDOMEN PELVIS W CONTRAST  Result Date: 10/03/2022 CLINICAL DATA:  Acute abdominal pain EXAM: CT ABDOMEN AND PELVIS WITH CONTRAST TECHNIQUE:  Multidetector CT imaging of the abdomen and pelvis was performed using the standard protocol following bolus administration of intravenous contrast. RADIATION DOSE REDUCTION: This exam was performed according to the departmental dose-optimization program which includes automated exposure control, adjustment of the mA and/or kV according to patient size and/or use of iterative reconstruction technique. CONTRAST:  75mL OMNIPAQUE IOHEXOL 350 MG/ML SOLN COMPARISON:  Right hip and lumbar spine CT 09/30/2022 trauma remote abdominopelvic CT 11/10/2010 present since 2012 FINDINGS: Lower chest: Subsegmental pulmonary emboli in the right lower lobe. Dependent atelectasis in the lung bases. The heart is mildly enlarged. Hepatobiliary: Mild hepatic steatosis. The liver is prominent size spanning 17 cm cranial caudal. No focal liver abnormality. Clips in the gallbladder fossa postcholecystectomy. No biliary dilatation. Pancreas: No ductal dilatation or inflammation. Spleen: Normal in size.  No focal abnormality.  Incidental splenule. Adrenals/Urinary Tract: 2 cm right adrenal nodule is been present since 2012 exam consistent with benign adenoma, needs no further imaging follow-up. Normal left adrenal gland. No hydronephrosis or renal calculi. No suspicious renal abnormality. Moderate bladder distention without wall thickening. Stomach/Bowel: Small to moderate-sized hiatal hernia. The stomach is otherwise decompressed. No small bowel obstruction or inflammation. Colonic diverticulosis without diverticulitis. The appendix is not seen. Vascular/Lymphatic: Extensive aortic atherosclerosis. There is also irregular noncalcified plaque in the upper abdominal a descending thoracic aorta. No aortic inflammation. The portal vein is patent. Circumaortic left renal vein. No filling defects in the included femoral and iliac veins. No IVC thrombus. No adenopathy. Reproductive: Status post hysterectomy. No adnexal masses. Other: No free air or  ascites. Postsurgical change of the anterior abdominal wall. Musculoskeletal: Stable appearance of mild L2 compression fracture. Posterior L4-L5 fusion hardware. Degenerative change in the included thoracic and lumbar spine. Degenerative change of the pubic symphysis. IMPRESSION: 1. Subsegmental pulmonary emboli in the right lower lobe. 2. No acute abnormality in the abdomen/pelvis. 3. Colonic diverticulosis without diverticulitis. 4. Small to moderate-sized hiatal hernia. 5. Mild hepatic steatosis. 6. Advanced aortic and branch atherosclerosis. There is irregular noncalcified plaque in the lower thoracic and upper abdominal aorta. Critical Value/emergent results were called by telephone at the time of interpretation on 10/03/2022 at 7:11 pm to provider [facility] , who  verbally acknowledged these results. Electronically Signed   By: Narda Rutherford M.D.   On: 10/03/2022 19:11   CT Lumbar Spine Wo Contrast  Result Date: 09/30/2022 CLINICAL DATA:  Low back pain, trauma.  Fall EXAM: CT LUMBAR SPINE WITHOUT CONTRAST TECHNIQUE: Multidetector CT imaging of the lumbar spine was performed without intravenous contrast administration. Multiplanar CT image reconstructions were also generated. RADIATION DOSE REDUCTION: This exam was performed according to the departmental dose-optimization program which includes automated exposure control, adjustment of the mA and/or kV according to patient size and/or use of iterative reconstruction technique. COMPARISON:  CT abdomen pelvis 11/10/2010 FINDINGS: Segmentation: 4 lumbar type vertebrae.  L1 noted to have ribs. Alignment: Stable grade 1 anterolisthesis of L5 on S1. Vertebrae: L4-L5 posterolateral and interbody fusion surgical hardware. Multilevel moderate severe degenerative change of the spine. Posterior disc osteophyte complex formation of the L2-L3, L3-L4, L4-L5 levels. No associated severe osseous neural foraminal or central canal stenosis. Level. Likely acute L2  superior endplate compression fracture with at least 20% vertebral body height loss (7:57). Paraspinal and other soft tissues: Negative. Disc levels: Multilevel intervertebral disc space vacuum phenomenon. L4-L5 interbody fusion surgical hardware. Other: Aortic Atherosclerosis (ICD10-I70.0). Colonic diverticulosis. Small hiatal hernia. Healed sacral insufficiency fracture. IMPRESSION: 1. Likely acute L2 superior endplate compression fracture with at least 20% vertebral body height loss in a patient status post L4-L5 posterolateral interbody fusion surgical hardware (for counting purposes: L1 noted to have ribs.) 2.  Aortic Atherosclerosis (ICD10-I70.0). Electronically Signed   By: Tish Frederickson M.D.   On: 09/30/2022 21:33   CT HIP RIGHT WO CONTRAST  Result Date: 09/30/2022 CLINICAL DATA:  Hip trauma, fracture suspected, xray done EXAM: CT OF THE RIGHT HIP WITHOUT CONTRAST TECHNIQUE: Multidetector CT imaging of the right hip was performed according to the standard protocol. Multiplanar CT image reconstructions were also generated. RADIATION DOSE REDUCTION: This exam was performed according to the departmental dose-optimization program which includes automated exposure control, adjustment of the mA and/or kV according to patient size and/or use of iterative reconstruction technique. COMPARISON:  X-ray right hip 09/30/2022 FINDINGS: Bones/Joint/Cartilage No evidence of fracture, dislocation, or joint effusion. No pelvic fracture or diastasis the visualized right pelvis. Mild degenerative changes of the right hip. Degenerative changes of the pubic symphysis. No evidence of severe arthropathy. No aggressive appearing focal bone abnormality. Ligaments Suboptimally assessed by CT. Muscles and Tendons Grossly unremarkable. Soft tissues Soft tissues are unremarkable. Other: Colonic diverticulosis.  Atherosclerotic plaque. IMPRESSION: 1.  Negative for acute traumatic injury. 2.  Aortic Atherosclerosis (ICD10-I70.0).  Electronically Signed   By: Tish Frederickson M.D.   On: 09/30/2022 21:10   DG Chest 2 View  Result Date: 09/30/2022 CLINICAL DATA:  Status post fall. EXAM: CHEST - 2 VIEW COMPARISON:  Radiograph 08/31/2010 FINDINGS: Lung volumes are low. The heart is enlarged. Aortic atherosclerosis and tortuosity. No focal airspace disease, pleural effusion or pneumothorax. On limited assessment, no acute osseous abnormalities are seen. Thoracic scoliosis. IMPRESSION: 1. Low lung volumes without acute chest finding. 2. Cardiomegaly. Electronically Signed   By: Narda Rutherford M.D.   On: 09/30/2022 17:16   DG Knee 2 Views Right  Result Date: 09/30/2022 CLINICAL DATA:  Status post fall with pain. EXAM: RIGHT KNEE - 1-2 VIEW COMPARISON:  None Available. FINDINGS: Right knee arthroplasty in expected alignment. No acute or periprosthetic fracture. There has been patellar resurfacing. No significant knee joint effusion. Minor anterior soft tissue edema. IMPRESSION: Right knee arthroplasty without complication or acute fracture. Electronically Signed  By: Narda Rutherford M.D.   On: 09/30/2022 17:15   DG Lumbar Spine Complete  Result Date: 09/30/2022 CLINICAL DATA:  Status post fall with pain. EXAM: LUMBAR SPINE - COMPLETE 4+ VIEW COMPARISON:  Lumbar radiograph 09/24/2016 FINDINGS: Four non-rib-bearing lumbar vertebra, the lower most lumbar vertebra will be labeled L5-S1 prior radiograph. Mild levo scoliotic curvature, unchanged. Posterior rod with intrapedicular screw fusion and interbody spacer at L4-L5. Chronic grade 1 anterolisthesis of L5 on S1. Minimal undulation of superior L2 endplate is new from prior exam, possible minor superior endplate compression deformity. Degenerative disc disease and facet hypertrophy at L3-L4. The bones are subjectively under mineralized. Aortic atherosclerosis. IMPRESSION: 1. Minimal undulation of superior L2 endplate is new from prior exam, possible minor superior endplate compression  deformity. Recommend correlation for point tenderness. 2. Otherwise unchanged appearance of the lumbar spine with scoliosis, postsurgical and degenerative change. L4-L5 hardware is intact. Electronically Signed   By: Narda Rutherford M.D.   On: 09/30/2022 17:14   DG Hip Unilat  With Pelvis 2-3 Views Right  Result Date: 09/30/2022 CLINICAL DATA:  Fall with hip pain. EXAM: DG HIP (WITH OR WITHOUT PELVIS) 2-3V RIGHT COMPARISON:  None Available. FINDINGS: There is no evidence of hip fracture or dislocation. Intact bony pelvis including pubic rami. Pubic symphysis and sacroiliac joints are congruent, both with mild degenerative change. Mild degenerative change of the hips. IMPRESSION: No fracture of the pelvis or right hip. Electronically Signed   By: Narda Rutherford M.D.   On: 09/30/2022 17:11    Microbiology: Results for orders placed or performed during the hospital encounter of 09/06/16  Surgical pcr screen     Status: None   Collection Time: 09/06/16  3:46 PM   Specimen: Nasal Mucosa; Nasal Swab  Result Value Ref Range Status   MRSA, PCR NEGATIVE NEGATIVE Final   Staphylococcus aureus NEGATIVE NEGATIVE Final    Comment:        The Xpert SA Assay (FDA approved for NASAL specimens in patients over 72 years of age), is one component of a comprehensive surveillance program.  Test performance has been validated by [facility] for patients greater than or equal to 52 year old. It is not intended to diagnose infection nor to guide or monitor treatment.     Labs: CBC: Recent Labs  Lab 10/04/22 0638 10/07/22 0818 10/09/22 0250  WBC 9.3 6.6 7.7  HGB 15.6* 15.0 14.8  HCT 45.8 46.8* 45.4  MCV 92.3 93.8 92.8  PLT 248 224 226   Basic Metabolic Panel: Recent Labs  Lab 10/04/22 0700 10/05/22 0408 10/07/22 0818 10/09/22 0250  NA 135 133* 137 135  K 3.7 3.4* 3.6 3.7  CL 97* 98 99 99  CO2 25 23 29 28   GLUCOSE 82 92 104* 97  BUN 16 20 18 16   CREATININE 1.09* 0.96 0.93 0.91   CALCIUM 9.0 8.9 9.1 9.0   Liver Function Tests: Recent Labs  Lab 10/07/22 0818 10/09/22 0250  AST 58* 29  ALT 48* 52*  ALKPHOS 50 48  BILITOT 0.9 0.8  PROT 5.9* 5.6*  ALBUMIN 3.1* 3.0*   CBG: Recent Labs  Lab 10/09/22 1119 10/09/22 1635 10/09/22 2003 10/10/22 0714 10/10/22 1105  GLUCAP 151* 141* 98 94 143*    Discharge time spent: less than 30 minutes.  Signed: Rickey Barbara, MD [facility] 10/10/2022

## 2022-10-10 NOTE — Plan of Care (Signed)
  Problem: Education: Goal: Ability to describe self-care measures that may prevent or decrease complications (Diabetes Survival Skills Education) will improve Outcome: Progressing   Problem: Coping: Goal: Ability to adjust to condition or change in health will improve Outcome: Progressing   Problem: Health Behavior/Discharge Planning: Goal: Ability to identify and utilize available resources and services will improve Outcome: Progressing Goal: Ability to manage health-related needs will improve Outcome: Progressing   Problem: Nutritional: Goal: Maintenance of adequate nutrition will improve Outcome: Progressing Goal: Progress toward achieving an optimal weight will improve Outcome: Progressing

## 2022-10-10 NOTE — TOC Progression Note (Signed)
Transition of Care [facility]) - Progression Note    Patient Details  Name: [name] MRN: [id number] Date of Birth: 28-Oct-1945  Transition of Care [facility]) CM/SW Contact  Lorri Frederick, LCSW Phone Number: 10/10/2022, 9:55 AM  Clinical Narrative:   CSW confirmed with [facility] that they can receive pt today.  MD notified.    Expected Discharge Plan: Skilled Nursing Facility Barriers to Discharge: No Barriers Identified  Expected Discharge Plan and Services                                   HH Arranged: PT, OT HH Agency: [facility] Date [facility] Agency Contacted: 10/03/22 Time HH Agency Contacted: [phone] Representative spoke with at [facility] Agency: [place]   Social Determinants of Health (SDOH) Interventions SDOH Screenings   Food Insecurity: No Food Insecurity (10/01/2022)  Housing: Low Risk  (10/01/2022)  Transportation Needs: No Transportation Needs (10/01/2022)  Utilities: Not At Risk (10/01/2022)  Tobacco Use: Medium Risk (09/30/2022)    Readmission Risk Interventions     No data to display

## 2022-10-10 NOTE — Care Management Important Message (Signed)
Important Message  Patient Details  Name: MINAL STULLER MRN: [id number] Date of Birth: [birth year]   Medicare Important Message Given:  Yes     Sherilyn Banker 10/10/2022, 3:58 PM

## 2022-10-10 NOTE — TOC Transition Note (Signed)
Transition of Care [facility]) - CM/SW Discharge Note   Patient Details  Name: Stacy Kirk MRN: [id number] Date of Birth: 01/30/1946  Transition of Care [facility]) CM/SW Contact:  Lorri Frederick, LCSW Phone Number: 10/10/2022, 12:29 PM   Clinical Narrative:  Pt discharging to [facility].  RN call report to  [phone].      Final next level of care: Skilled Nursing Facility Barriers to Discharge: Barriers Resolved   Patient Goals and CMS Choice   Choice offered to / list presented to : Patient, Spouse  Discharge Placement                Patient chooses bed at: [facility] Patient to be transferred to facility by: [facility] Name of family member notified: husband [name] Patient and family notified of of transfer: 10/10/22  Discharge Plan and Services Additional resources added to the After Visit Summary for                            [facility] Arranged: PT, OT [facility] Agency: [facility] Date [facility] Agency Contacted: 10/03/22 Time HH Agency Contacted: [phone] Representative spoke with at [facility] Agency: [place]  Social Determinants of Health (SDOH) Interventions SDOH Screenings   Food Insecurity: No Food Insecurity (10/01/2022)  Housing: Low Risk  (10/01/2022)  Transportation Needs: No Transportation Needs (10/01/2022)  Utilities: Not At Risk (10/01/2022)  Tobacco Use: Medium Risk (09/30/2022)     Readmission Risk Interventions     No data to display

## 2022-10-10 NOTE — Plan of Care (Signed)
AVS reviewed and placed in chart. Report called.  All questions answered.

## 2022-10-10 NOTE — Progress Notes (Signed)
Physical Therapy Treatment Patient Details Name: Stacy Kirk MRN: [id number] DOB: 1945/07/18 Today's Date: 10/10/2022   History of Present Illness The pt is a 77 yo female presenting 7/5 after a fall at home. Work up revealed acute L2 compression fx. PMH includes: obesity, arthritis, cancer, DM II, HTN, and hypothyroidism.    PT Comments  The pt was able to make good progress with OOB mobility this session. She completed 20 ft ambulation with use of RW and chair follow with no overt buckling. She does need frequent cues for safety and for increased DF and clearance of L foot. The pt demos functional weakness and coordination deficits in L foot with gait that progress with fatigue. Recommendations remain appropriate.      Assistance Recommended at Discharge Frequent or constant Supervision/Assistance  If plan is discharge home, recommend the following:  Can travel by private vehicle    Two people to help with walking and/or transfers;A lot of help with bathing/dressing/bathroom;Assistance with cooking/housework;Assist for transportation;Help with stairs or ramp for entrance;Direct supervision/assist for medications management   No  Equipment Recommendations  Rolling walker (2 wheels);BSC/3in1    Recommendations for Other Services       Precautions / Restrictions Precautions Precautions: Fall;Back Precaution Booklet Issued: No Precaution Comments: pt unable to recall back precautions, no adherence even after cues Required Braces or Orthoses: Spinal Brace Spinal Brace: Thoracolumbosacral orthotic;Applied in sitting position Restrictions Weight Bearing Restrictions: No     Mobility  Bed Mobility Overal bed mobility: Needs Assistance Bed Mobility: Sidelying to Sit, Rolling Rolling: Min assist Sidelying to sit: Min assist       General bed mobility comments: minA to complete from flat bed, cues for LE movement and increased time and effort for LLE movement     Transfers Overall transfer level: Needs assistance Equipment used: Rolling walker (2 wheels) Transfers: Sit to/from Stand Sit to Stand: From elevated surface, Min guard, +2 safety/equipment           General transfer comment: minG to rise from EOB and chair, cues for hand placement. slow but no assist.    Ambulation/Gait Ambulation/Gait assistance: Min guard, +2 safety/equipment (chair follow) Gait Distance (Feet): 20 Feet (+ 5 ft) Assistive device: Rolling walker (2 wheels) Gait Pattern/deviations: Step-through pattern, Decreased stride length, Shuffle, Decreased weight shift to right, Decreased weight shift to left, Decreased step length - left, Decreased dorsiflexion - left Gait velocity: decreased     General Gait Details: pt with trunk flexed and progressive decrease in step length and clearance of LLE. pt with poor DF, cues for increased step with LLE.   Stairs             Wheelchair Mobility     Tilt Bed    Modified Rankin (Stroke Patients Only)       Balance Overall balance assessment: Needs assistance Sitting-balance support: Bilateral upper extremity supported, Feet supported Sitting balance-Leahy Scale: Poor Sitting balance - Comments: continues to have increased L lateral lean and B UE support making it difficult to don brace Postural control: Left lateral lean Standing balance support: Bilateral upper extremity supported, During functional activity, Reliant on assistive device for balance Standing balance-Leahy Scale: Poor Standing balance comment: dependent on external assist and BUE support                            Cognition Arousal/Alertness: Awake/alert Behavior During Therapy: WFL for tasks assessed/performed, Impulsive Overall Cognitive Status: No  family/caregiver present to determine baseline cognitive functioning Area of Impairment: Memory, Safety/judgement, Awareness, Problem solving, Following commands                      Memory: Decreased recall of precautions, Decreased short-term memory Following Commands: Follows one step commands inconsistently (possibly due to personality or HOH?) Safety/Judgement: Decreased awareness of safety, Decreased awareness of deficits   Problem Solving: Slow processing, Decreased initiation, Difficulty sequencing, Requires verbal cues General Comments: pt following some commands well, unable to maintain UE on RW as pt constantly letting go to hold bed or wall for "stability" or letting go to adjust gown or hair while walking. no change in pt behavior with education, at times pt not responding to education and at other times verbalized understanding.        Exercises      General Comments        Pertinent Vitals/Pain Pain Assessment Pain Assessment: 0-10 Pain Score: 3  Faces Pain Scale: Hurts even more Pain Location: back with mobility Pain Descriptors / Indicators: Discomfort, Grimacing, Moaning, Sharp Pain Intervention(s): Limited activity within patient's tolerance, Monitored during session, Repositioned, Premedicated before session    Home Living                          Prior Function            PT Goals (current goals can now be found in the care plan section) Acute Rehab PT Goals Patient Stated Goal: reduce pain PT Goal Formulation: With patient Time For Goal Achievement: 10/15/22 Potential to Achieve Goals: Good Progress towards PT goals: Progressing toward goals    Frequency    Min 3X/week      PT Plan Current plan remains appropriate       AM-PAC PT "6 Clicks" Mobility   Outcome Measure  Help needed turning from your back to your side while in a flat bed without using bedrails?: A Little Help needed moving from lying on your back to sitting on the side of a flat bed without using bedrails?: A Lot Help needed moving to and from a bed to a chair (including a wheelchair)?: A Lot Help needed standing up from a chair  using your arms (e.g., wheelchair or bedside chair)?: A Lot Help needed to walk in hospital room?: A Lot Help needed climbing 3-5 steps with a railing? : A Lot 6 Click Score: 13    End of Session Equipment Utilized During Treatment: Gait belt Activity Tolerance: Patient tolerated treatment well Patient left: in chair;with call bell/phone within reach;with chair alarm set Nurse Communication: Mobility status PT Visit Diagnosis: Unsteadiness on feet (R26.81);Muscle weakness (generalized) (M62.81);Other abnormalities of gait and mobility (R26.89);Pain Pain - Right/Left: Right Pain - part of body: Ankle and joints of foot;Leg     Time: 1040-1059 PT Time Calculation (min) (ACUTE ONLY): 19 min  Charges:    $Gait Training: 8-22 mins PT General Charges $$ ACUTE PT VISIT: 1 Visit                     Vickki Muff, PT, DPT   Acute Rehabilitation Department Office [phone] Secure Chat Communication Preferred   Ronnie Derby 10/10/2022, 12:34 PM

## 2022-10-11 DIAGNOSIS — R2689 Other abnormalities of gait and mobility: Secondary | ICD-10-CM | POA: Diagnosis not present

## 2022-10-11 DIAGNOSIS — Z9181 History of falling: Secondary | ICD-10-CM | POA: Diagnosis not present

## 2022-10-11 DIAGNOSIS — R296 Repeated falls: Secondary | ICD-10-CM | POA: Diagnosis not present

## 2022-10-11 DIAGNOSIS — I10 Essential (primary) hypertension: Secondary | ICD-10-CM | POA: Diagnosis not present

## 2022-10-11 DIAGNOSIS — E1165 Type 2 diabetes mellitus with hyperglycemia: Secondary | ICD-10-CM | POA: Diagnosis not present

## 2022-10-11 DIAGNOSIS — I82401 Acute embolism and thrombosis of unspecified deep veins of right lower extremity: Secondary | ICD-10-CM | POA: Diagnosis not present

## 2022-10-11 DIAGNOSIS — M6281 Muscle weakness (generalized): Secondary | ICD-10-CM | POA: Diagnosis not present

## 2022-10-11 DIAGNOSIS — I110 Hypertensive heart disease with heart failure: Secondary | ICD-10-CM | POA: Diagnosis not present

## 2022-10-11 DIAGNOSIS — Z86711 Personal history of pulmonary embolism: Secondary | ICD-10-CM | POA: Diagnosis not present

## 2022-10-11 DIAGNOSIS — S32000D Wedge compression fracture of unspecified lumbar vertebra, subsequent encounter for fracture with routine healing: Secondary | ICD-10-CM | POA: Diagnosis not present

## 2022-10-11 DIAGNOSIS — R278 Other lack of coordination: Secondary | ICD-10-CM | POA: Diagnosis not present

## 2022-10-11 DIAGNOSIS — S32000A Wedge compression fracture of unspecified lumbar vertebra, initial encounter for closed fracture: Secondary | ICD-10-CM | POA: Diagnosis not present

## 2022-10-12 DIAGNOSIS — I10 Essential (primary) hypertension: Secondary | ICD-10-CM | POA: Diagnosis not present

## 2022-10-12 DIAGNOSIS — Z86711 Personal history of pulmonary embolism: Secondary | ICD-10-CM | POA: Diagnosis not present

## 2022-10-12 DIAGNOSIS — M6281 Muscle weakness (generalized): Secondary | ICD-10-CM | POA: Diagnosis not present

## 2022-10-12 DIAGNOSIS — I82401 Acute embolism and thrombosis of unspecified deep veins of right lower extremity: Secondary | ICD-10-CM | POA: Diagnosis not present

## 2022-10-12 DIAGNOSIS — S32000A Wedge compression fracture of unspecified lumbar vertebra, initial encounter for closed fracture: Secondary | ICD-10-CM | POA: Diagnosis not present

## 2022-10-12 DIAGNOSIS — I110 Hypertensive heart disease with heart failure: Secondary | ICD-10-CM | POA: Diagnosis not present

## 2022-10-12 DIAGNOSIS — E1165 Type 2 diabetes mellitus with hyperglycemia: Secondary | ICD-10-CM | POA: Diagnosis not present

## 2022-10-12 DIAGNOSIS — R296 Repeated falls: Secondary | ICD-10-CM | POA: Diagnosis not present

## 2022-10-14 DIAGNOSIS — Z9181 History of falling: Secondary | ICD-10-CM | POA: Diagnosis not present

## 2022-10-14 DIAGNOSIS — I10 Essential (primary) hypertension: Secondary | ICD-10-CM | POA: Diagnosis not present

## 2022-10-14 DIAGNOSIS — S32000A Wedge compression fracture of unspecified lumbar vertebra, initial encounter for closed fracture: Secondary | ICD-10-CM | POA: Diagnosis not present

## 2022-10-14 DIAGNOSIS — E1165 Type 2 diabetes mellitus with hyperglycemia: Secondary | ICD-10-CM | POA: Diagnosis not present

## 2022-10-14 DIAGNOSIS — I110 Hypertensive heart disease with heart failure: Secondary | ICD-10-CM | POA: Diagnosis not present

## 2022-10-14 DIAGNOSIS — Z86711 Personal history of pulmonary embolism: Secondary | ICD-10-CM | POA: Diagnosis not present

## 2022-10-14 DIAGNOSIS — I82401 Acute embolism and thrombosis of unspecified deep veins of right lower extremity: Secondary | ICD-10-CM | POA: Diagnosis not present

## 2022-10-14 DIAGNOSIS — S32000D Wedge compression fracture of unspecified lumbar vertebra, subsequent encounter for fracture with routine healing: Secondary | ICD-10-CM | POA: Diagnosis not present

## 2022-10-14 DIAGNOSIS — R278 Other lack of coordination: Secondary | ICD-10-CM | POA: Diagnosis not present

## 2022-10-14 DIAGNOSIS — R2689 Other abnormalities of gait and mobility: Secondary | ICD-10-CM | POA: Diagnosis not present

## 2022-10-14 DIAGNOSIS — M6281 Muscle weakness (generalized): Secondary | ICD-10-CM | POA: Diagnosis not present

## 2022-10-14 DIAGNOSIS — R296 Repeated falls: Secondary | ICD-10-CM | POA: Diagnosis not present

## 2022-10-17 DIAGNOSIS — R296 Repeated falls: Secondary | ICD-10-CM | POA: Diagnosis not present

## 2022-10-17 DIAGNOSIS — S32000A Wedge compression fracture of unspecified lumbar vertebra, initial encounter for closed fracture: Secondary | ICD-10-CM | POA: Diagnosis not present

## 2022-10-17 DIAGNOSIS — M25562 Pain in left knee: Secondary | ICD-10-CM | POA: Diagnosis not present

## 2022-10-17 DIAGNOSIS — R051 Acute cough: Secondary | ICD-10-CM | POA: Diagnosis not present

## 2022-10-17 DIAGNOSIS — I82401 Acute embolism and thrombosis of unspecified deep veins of right lower extremity: Secondary | ICD-10-CM | POA: Diagnosis not present

## 2022-10-17 DIAGNOSIS — E1165 Type 2 diabetes mellitus with hyperglycemia: Secondary | ICD-10-CM | POA: Diagnosis not present

## 2022-10-17 DIAGNOSIS — Z86711 Personal history of pulmonary embolism: Secondary | ICD-10-CM | POA: Diagnosis not present

## 2022-10-17 DIAGNOSIS — M6281 Muscle weakness (generalized): Secondary | ICD-10-CM | POA: Diagnosis not present

## 2022-10-17 DIAGNOSIS — I110 Hypertensive heart disease with heart failure: Secondary | ICD-10-CM | POA: Diagnosis not present

## 2022-10-18 DIAGNOSIS — E1165 Type 2 diabetes mellitus with hyperglycemia: Secondary | ICD-10-CM | POA: Diagnosis not present

## 2022-10-18 DIAGNOSIS — I82401 Acute embolism and thrombosis of unspecified deep veins of right lower extremity: Secondary | ICD-10-CM | POA: Diagnosis not present

## 2022-10-18 DIAGNOSIS — I110 Hypertensive heart disease with heart failure: Secondary | ICD-10-CM | POA: Diagnosis not present

## 2022-10-18 DIAGNOSIS — Z86711 Personal history of pulmonary embolism: Secondary | ICD-10-CM | POA: Diagnosis not present

## 2022-10-18 DIAGNOSIS — S32000A Wedge compression fracture of unspecified lumbar vertebra, initial encounter for closed fracture: Secondary | ICD-10-CM | POA: Diagnosis not present

## 2022-10-18 DIAGNOSIS — M6281 Muscle weakness (generalized): Secondary | ICD-10-CM | POA: Diagnosis not present

## 2022-10-18 DIAGNOSIS — I10 Essential (primary) hypertension: Secondary | ICD-10-CM | POA: Diagnosis not present

## 2022-10-18 DIAGNOSIS — R296 Repeated falls: Secondary | ICD-10-CM | POA: Diagnosis not present

## 2022-10-19 DIAGNOSIS — R296 Repeated falls: Secondary | ICD-10-CM | POA: Diagnosis not present

## 2022-10-19 DIAGNOSIS — I82401 Acute embolism and thrombosis of unspecified deep veins of right lower extremity: Secondary | ICD-10-CM | POA: Diagnosis not present

## 2022-10-19 DIAGNOSIS — E1165 Type 2 diabetes mellitus with hyperglycemia: Secondary | ICD-10-CM | POA: Diagnosis not present

## 2022-10-19 DIAGNOSIS — I10 Essential (primary) hypertension: Secondary | ICD-10-CM | POA: Diagnosis not present

## 2022-10-19 DIAGNOSIS — S32000A Wedge compression fracture of unspecified lumbar vertebra, initial encounter for closed fracture: Secondary | ICD-10-CM | POA: Diagnosis not present

## 2022-10-19 DIAGNOSIS — M6281 Muscle weakness (generalized): Secondary | ICD-10-CM | POA: Diagnosis not present

## 2022-10-19 DIAGNOSIS — I110 Hypertensive heart disease with heart failure: Secondary | ICD-10-CM | POA: Diagnosis not present

## 2022-10-19 DIAGNOSIS — Z86711 Personal history of pulmonary embolism: Secondary | ICD-10-CM | POA: Diagnosis not present

## 2022-10-21 ENCOUNTER — Emergency Department (HOSPITAL_COMMUNITY): Payer: Medicare HMO

## 2022-10-21 ENCOUNTER — Other Ambulatory Visit: Payer: Self-pay

## 2022-10-21 ENCOUNTER — Emergency Department (HOSPITAL_COMMUNITY)
Admission: EM | Admit: 2022-10-21 | Discharge: 2022-10-24 | Disposition: A | Discharge status: Home / Self Care | Payer: Medicare HMO | Attending: Student | Admitting: Student

## 2022-10-21 DIAGNOSIS — Z85828 Personal history of other malignant neoplasm of skin: Secondary | ICD-10-CM | POA: Diagnosis not present

## 2022-10-21 DIAGNOSIS — Z7901 Long term (current) use of anticoagulants: Secondary | ICD-10-CM | POA: Insufficient documentation

## 2022-10-21 DIAGNOSIS — S3330XA Dislocation of unspecified parts of lumbar spine and pelvis, initial encounter: Secondary | ICD-10-CM | POA: Insufficient documentation

## 2022-10-21 DIAGNOSIS — R2689 Other abnormalities of gait and mobility: Secondary | ICD-10-CM | POA: Insufficient documentation

## 2022-10-21 DIAGNOSIS — E119 Type 2 diabetes mellitus without complications: Secondary | ICD-10-CM | POA: Diagnosis not present

## 2022-10-21 DIAGNOSIS — E039 Hypothyroidism, unspecified: Secondary | ICD-10-CM | POA: Diagnosis not present

## 2022-10-21 DIAGNOSIS — M1612 Unilateral primary osteoarthritis, left hip: Secondary | ICD-10-CM | POA: Diagnosis not present

## 2022-10-21 DIAGNOSIS — Z043 Encounter for examination and observation following other accident: Secondary | ICD-10-CM | POA: Diagnosis not present

## 2022-10-21 DIAGNOSIS — Z8543 Personal history of malignant neoplasm of ovary: Secondary | ICD-10-CM | POA: Insufficient documentation

## 2022-10-21 DIAGNOSIS — Z9181 History of falling: Secondary | ICD-10-CM | POA: Insufficient documentation

## 2022-10-21 DIAGNOSIS — Z87891 Personal history of nicotine dependence: Secondary | ICD-10-CM | POA: Insufficient documentation

## 2022-10-21 DIAGNOSIS — Z7984 Long term (current) use of oral hypoglycemic drugs: Secondary | ICD-10-CM | POA: Insufficient documentation

## 2022-10-21 DIAGNOSIS — D72829 Elevated white blood cell count, unspecified: Secondary | ICD-10-CM | POA: Insufficient documentation

## 2022-10-21 DIAGNOSIS — R29890 Loss of height: Secondary | ICD-10-CM | POA: Diagnosis not present

## 2022-10-21 DIAGNOSIS — K5730 Diverticulosis of large intestine without perforation or abscess without bleeding: Secondary | ICD-10-CM | POA: Diagnosis not present

## 2022-10-21 DIAGNOSIS — Z79899 Other long term (current) drug therapy: Secondary | ICD-10-CM | POA: Diagnosis not present

## 2022-10-21 DIAGNOSIS — K439 Ventral hernia without obstruction or gangrene: Secondary | ICD-10-CM | POA: Diagnosis not present

## 2022-10-21 DIAGNOSIS — K449 Diaphragmatic hernia without obstruction or gangrene: Secondary | ICD-10-CM | POA: Diagnosis not present

## 2022-10-21 DIAGNOSIS — S32009A Unspecified fracture of unspecified lumbar vertebra, initial encounter for closed fracture: Secondary | ICD-10-CM | POA: Diagnosis not present

## 2022-10-21 DIAGNOSIS — S3339XA Dislocation of other parts of lumbar spine and pelvis, initial encounter: Secondary | ICD-10-CM | POA: Diagnosis not present

## 2022-10-21 DIAGNOSIS — M25562 Pain in left knee: Secondary | ICD-10-CM | POA: Diagnosis not present

## 2022-10-21 DIAGNOSIS — R112 Nausea with vomiting, unspecified: Secondary | ICD-10-CM | POA: Diagnosis not present

## 2022-10-21 DIAGNOSIS — S3992XA Unspecified injury of lower back, initial encounter: Secondary | ICD-10-CM | POA: Diagnosis present

## 2022-10-21 DIAGNOSIS — W19XXXA Unspecified fall, initial encounter: Secondary | ICD-10-CM | POA: Diagnosis not present

## 2022-10-21 DIAGNOSIS — R29898 Other symptoms and signs involving the musculoskeletal system: Secondary | ICD-10-CM | POA: Diagnosis not present

## 2022-10-21 DIAGNOSIS — I10 Essential (primary) hypertension: Secondary | ICD-10-CM | POA: Insufficient documentation

## 2022-10-21 DIAGNOSIS — M1712 Unilateral primary osteoarthritis, left knee: Secondary | ICD-10-CM | POA: Diagnosis not present

## 2022-10-21 LAB — COMPREHENSIVE METABOLIC PANEL
ALT: 26 U/L (ref 0–44)
AST: 24 U/L (ref 15–41)
Albumin: 3.8 g/dL (ref 3.5–5.0)
Alkaline Phosphatase: 60 U/L (ref 38–126)
Anion gap: 13 (ref 5–15)
BUN: 11 mg/dL (ref 8–23)
CO2: 29 mmol/L (ref 22–32)
Calcium: 9.7 mg/dL (ref 8.9–10.3)
Chloride: 94 mmol/L — ABNORMAL LOW (ref 98–111)
Creatinine, Ser: 0.95 mg/dL (ref 0.44–1.00)
GFR, Estimated: >60 mL/min (ref >60)
Glucose, Bld: 140 mg/dL — ABNORMAL HIGH (ref 70–99)
Potassium: 3.3 mmol/L — ABNORMAL LOW (ref 3.5–5.1)
Sodium: 136 mmol/L (ref 135–145)
Total Bilirubin: 1.3 mg/dL — ABNORMAL HIGH (ref 0.3–1.2)
Total Protein: 7.1 g/dL (ref 6.5–8.1)

## 2022-10-21 LAB — CBC WITH DIFFERENTIAL/PLATELET
Abs Immature Granulocytes: 0.04 10*3/uL (ref 0.00–0.07)
Basophils Absolute: 0 10*3/uL (ref 0.0–0.1)
Basophils Relative: 0 %
Eosinophils Absolute: 0 10*3/uL (ref 0.0–0.5)
Eosinophils Relative: 0 %
HCT: 50.5 % — ABNORMAL HIGH (ref 36.0–46.0)
Hemoglobin: 17 g/dL — ABNORMAL HIGH (ref 12.0–15.0)
Immature Granulocytes: 0 %
Lymphocytes Relative: 12 %
Lymphs Abs: 1.3 10*3/uL (ref 0.7–4.0)
MCH: 31 pg (ref 26.0–34.0)
MCHC: 33.7 g/dL (ref 30.0–36.0)
MCV: 92.2 fL (ref 80.0–100.0)
Monocytes Absolute: 0.9 10*3/uL (ref 0.1–1.0)
Monocytes Relative: 8 %
Neutro Abs: 8.7 10*3/uL — ABNORMAL HIGH (ref 1.7–7.7)
Neutrophils Relative %: 80 %
Platelets: 243 10*3/uL (ref 150–400)
RBC: 5.48 MIL/uL — ABNORMAL HIGH (ref 3.87–5.11)
RDW: 14.1 % (ref 11.5–15.5)
WBC: 11 10*3/uL — ABNORMAL HIGH (ref 4.0–10.5)
nRBC: 0 % (ref 0.0–0.2)

## 2022-10-21 LAB — LIPASE, BLOOD: Lipase: 38 U/L (ref 11–51)

## 2022-10-21 MED ORDER — METFORMIN HCL 500 MG PO TABS: 500.0000 mg | ORAL_TABLET | Freq: Every day | ORAL | Status: DC

## 2022-10-21 MED ORDER — SENNOSIDES-DOCUSATE SODIUM 8.6-50 MG PO TABS
2.0000 | ORAL_TABLET | Freq: Two times a day (BID) | ORAL | Status: DC
  Filled 2022-10-21 (×3): qty 2

## 2022-10-21 MED ORDER — ONDANSETRON HCL 4 MG/2ML IJ SOLN
4.0000 mg | Freq: Once | INTRAMUSCULAR | Status: AC
  Administered 2022-10-21: 4 mg via INTRAVENOUS
  Filled 2022-10-21: qty 2

## 2022-10-21 MED ORDER — POLYETHYLENE GLYCOL 3350 17 G PO PACK
17.0000 g | PACK | Freq: Two times a day (BID) | ORAL | Status: DC
  Filled 2022-10-21 (×2): qty 1

## 2022-10-21 MED ORDER — IOHEXOL 350 MG/ML SOLN
75.0000 mL | Freq: Once | INTRAVENOUS | Status: AC | PRN
  Administered 2022-10-21: 75 mL via INTRAVENOUS

## 2022-10-21 MED ORDER — LACTATED RINGERS IV BOLUS
1000.0000 mL | Freq: Once | INTRAVENOUS | Status: AC
  Administered 2022-10-21: 1000 mL via INTRAVENOUS

## 2022-10-21 MED ORDER — FENTANYL CITRATE PF 50 MCG/ML IJ SOSY
50.0000 ug | PREFILLED_SYRINGE | Freq: Once | INTRAMUSCULAR | Status: AC
  Administered 2022-10-21: 50 ug via INTRAVENOUS
  Filled 2022-10-21: qty 1

## 2022-10-21 MED ORDER — APIXABAN 5 MG PO TABS
5.0000 mg | ORAL_TABLET | Freq: Two times a day (BID) | ORAL | Status: DC
  Administered 2022-10-21 – 2022-10-24 (×7): 5 mg via ORAL
  Filled 2022-10-21 (×7): qty 1

## 2022-10-21 MED ORDER — ONDANSETRON 4 MG PO TBDP
ORAL_TABLET | ORAL | Status: AC
  Administered 2022-10-21: 4 mg via ORAL
  Filled 2022-10-21: qty 1

## 2022-10-21 MED ORDER — ONDANSETRON 4 MG PO TBDP: 4.0000 mg | ORAL_TABLET | Freq: Once | ORAL | Status: AC

## 2022-10-21 MED ORDER — MAGNESIUM GLUCONATE 500 MG PO TABS
500.0000 mg | ORAL_TABLET | Freq: Every day | ORAL | Status: DC
  Administered 2022-10-21 – 2022-10-24 (×4): 500 mg via ORAL
  Filled 2022-10-21 (×5): qty 1

## 2022-10-21 MED ORDER — HYDROCODONE-ACETAMINOPHEN 5-325 MG PO TABS
1.0000 | ORAL_TABLET | Freq: Three times a day (TID) | ORAL | Status: DC | PRN
  Administered 2022-10-21 – 2022-10-22 (×3): 1 via ORAL
  Filled 2022-10-21 (×3): qty 1

## 2022-10-21 MED ORDER — METFORMIN HCL 500 MG PO TABS
500.0000 mg | ORAL_TABLET | Freq: Every day | ORAL | Status: DC
  Administered 2022-10-22 – 2022-10-23 (×2): 500 mg via ORAL
  Filled 2022-10-21 (×2): qty 1

## 2022-10-21 MED ORDER — DILTIAZEM HCL ER COATED BEADS 120 MG PO CP24
120.0000 mg | ORAL_CAPSULE | Freq: Every day | ORAL | Status: DC
  Administered 2022-10-21 – 2022-10-24 (×4): 120 mg via ORAL
  Filled 2022-10-21 (×5): qty 1

## 2022-10-21 MED ORDER — LOSARTAN POTASSIUM 50 MG PO TABS
25.0000 mg | ORAL_TABLET | Freq: Every day | ORAL | Status: DC
  Administered 2022-10-21 – 2022-10-24 (×4): 25 mg via ORAL
  Filled 2022-10-21 (×4): qty 1

## 2022-10-21 NOTE — Progress Notes (Addendum)
Pt faxed out. Bed offers pending.  Addend @ 1:58 PM Spouse accepted [employer]. Will initiate Auth.   Addend @ 2:06 PM Auth initiated.

## 2022-10-21 NOTE — ED Provider Notes (Signed)
[place] EMERGENCY DEPARTMENT AT [facility] Provider Note  CSN: [id number] Arrival date & time: 10/21/22 0130  Chief Complaint(s) Fall  HPI Stacy Kirk is a 77 y.o. female with PMH T2DM, HTN, hypothyroidism, lumbar stenosis status post L4-L5 fusion in 2018, obesity, recent hospital discharge on 10/10/2022 after a fall with an L2 compression fracture who presents emergency department for evaluation of nausea, vomiting and a fall.  Patient states that she she was just discharged from rehab facility.  Today she started to feel left flank pain and associated nausea and vomiting and attempted to get up with assistance of her husband.  This immediately prompted a fall onto her back leading to additional back pain.  Patient is having a significant amount of difficulty giving me an appropriate history here in the emergency department and I did try to call the patient's husband but was unable to reach any additional family members for further history.   Past Medical History Past Medical History:  Diagnosis Date   Arthritis    Cancer (HCC)    ovarian, skin face- skin cancer   Complication of anesthesia    slow to awaken everday, even slower with anesthesia   Constipation    Diabetes mellitus    Type II   GERD (gastroesophageal reflux disease)    Headache 05/12/2015   "after a fall" headache for 8 months   Hemorrhoids    Hernia    History of kidney stones    passed   Hypertension    Hypothyroidism    Loss of appetite    Muscle spasm    back   Osteoporosis    Redness    abd wound   Urine frequency    unable to hold urine    Wears hearing aid    bilateral    Patient Active Problem List   Diagnosis Date Noted   Lumbar compression fracture (HCC) 10/02/2022   Closed compression fracture of L2 lumbar vertebra, initial encounter (HCC) 09/30/2022   Hypokalemia 09/30/2022   Hypertension 09/30/2022   Controlled type 2 diabetes mellitus with complication, without  long-term current use of insulin (HCC) 08/05/2022   Chronic constipation 08/05/2022   OAB (overactive bladder) 08/05/2022   Hypothyroidism 08/05/2022   Fatigue 08/05/2022   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-L5 level 09/07/2016   Abdominal pain, RLQ 01/24/2011   Abdominal right lower quadrant swelling 01/24/2011   Low back pain 01/24/2011   Knee pain, right 01/24/2011   Home Medication(s) Prior to Admission medications   Medication Sig Start Date End Date Taking? Authorizing Provider  acetaminophen (TYLENOL) 500 MG tablet Take 1,000 mg by mouth every 6 (six) hours as needed (FOR PAIN.).    [provider]  apixaban (ELIQUIS) 5 MG TABS tablet Take 2 tablets (10 mg total) by mouth 2 (two) times daily for 2 days, THEN 1 tablet (5 mg total) 2 (two) times daily. 10/10/22 11/11/22  Jerald Kief, MD  baclofen (LIORESAL) 20 MG tablet Take 0.5 tablets (10 mg total) by mouth daily as needed for muscle spasms. Patient taking differently: Take 20 mg by mouth daily as needed for muscle spasms. 08/05/22   Eden Emms, NP  diltiazem (CARDIZEM CD) 120 MG 24 hr capsule Take 120 mg by mouth daily. 07/23/22   [provider]  docusate sodium (COLACE) 100 MG capsule Take 100 mg by mouth daily at 8 pm.    [provider]  HYDROcodone-acetaminophen (NORCO/VICODIN) 5-325 MG tablet Take 1-2 tablets by mouth  every 4 (four) hours as needed for moderate pain. 10/10/22   Jerald Kief, MD  Hydrocortisone (CORTIZONE-10 EX) Apply 1 application topically daily as needed (for itchy skin).    [provider]  ibuprofen (ADVIL,MOTRIN) 200 MG tablet Take 400 mg by mouth every 8 (eight) hours as needed (FOR PAIN).    [provider]  levothyroxine (SYNTHROID, LEVOTHROID) 100 MCG tablet Take 100 mcg by mouth daily before breakfast.  08/22/10   [provider]  linaclotide (LINZESS) 72 MCG capsule Take 1 capsule (72 mcg total) by mouth daily before breakfast. Patient not taking:  Reported on 10/01/2022 08/05/22   Eden Emms, NP  losartan (COZAAR) 25 MG tablet Take 1 tablet (25 mg total) by mouth daily. 10/11/22 11/10/22  Jerald Kief, MD  magnesium gluconate (MAGONATE) 500 MG tablet Take 500 mg by mouth daily.    [provider]  Menthol, Topical Analgesic, (BENGAY EX) Apply 1 application topically 4 (four) times daily as needed (for back pain).    [provider]  metFORMIN (GLUCOPHAGE) 500 MG tablet Take 1 tablet (500 mg total) by mouth daily after supper. Patient taking differently: Take 500 mg by mouth daily. 08/05/22   Eden Emms, NP  Multiple Vitamin (MULTIVITAMIN WITH MINERALS) TABS tablet Take 1 tablet by mouth daily. ESSENTIAL ONE SUPPLEMENT    [provider]  Multiple Vitamins-Minerals (HAIR/SKIN/NAILS PO) Take 1 capsule by mouth daily. HEALTHY HAIR,SKIN & NAILS    [provider]  polyethylene glycol (MIRALAX / GLYCOLAX) 17 g packet Take 17 g by mouth 2 (two) times daily. 10/10/22   Jerald Kief, MD  predniSONE (DELTASONE) 10 MG tablet Take 1 tablet (10 mg total) by mouth daily with breakfast. Patient taking differently: Take 10 mg by mouth in the morning, at noon, and at bedtime. 08/05/22   Eden Emms, NP  senna-docusate (SENOKOT-S) 8.6-50 MG tablet Take 2 tablets by mouth 2 (two) times daily. 10/10/22   Jerald Kief, MD                                                                                                                                    Past Surgical History Past Surgical History:  Procedure Laterality Date   ABDOMINAL HYSTERECTOMY     APPENDECTOMY     CHOLECYSTECTOMY     COLONOSCOPY W/ POLYPECTOMY     HERNIA REPAIR  Jun2012   open component separation w biologic mesh (Dr. Luisa Hart)   TOE SURGERY Left    2nd toe   TONSILLECTOMY     Family History Family History  Problem Relation Age of Onset   Diabetes Mother    Early death Sister    Drug abuse Sister     Social History Social History    Tobacco Use   Smoking status: Former    Current packs/day: 0.00    Types: Cigarettes    Start  date: 03/29/1983    Quit date: 03/28/1998    Years since quitting: 24.5   Smokeless tobacco: Never   Tobacco comments:    quit 2002  Vaping Use   Vaping status: Never Used  Substance Use Topics   Alcohol use: No   Drug use: No   Allergies Erythromycin, Tramadol, Albumin (human), Aspirin, Codeine, Cortisone, Propoxyphene, Simvastatin, and Triamcinolone acetonide  Review of Systems Review of Systems  Gastrointestinal:  Positive for nausea and vomiting.  Genitourinary:  Positive for flank pain.  Musculoskeletal:  Positive for back pain.    Physical Exam Vital Signs  I have reviewed the triage vital signs BP 137/67   Pulse 69   Temp 98.1 F (36.7 C) (Oral)   Resp 13   Ht 5' (1.524 m)   Wt 81.6 kg   SpO2 100%   BMI 35.15 kg/m   Physical Exam Vitals and nursing note reviewed.  Constitutional:      General: She is not in acute distress.    Appearance: She is well-developed.  HENT:     Head: Normocephalic and atraumatic.  Eyes:     Conjunctiva/sclera: Conjunctivae normal.  Cardiovascular:     Rate and Rhythm: Normal rate and regular rhythm.     Heart sounds: No murmur heard. Pulmonary:     Effort: Pulmonary effort is normal. No respiratory distress.     Breath sounds: Normal breath sounds.  Abdominal:     Palpations: Abdomen is soft.     Tenderness: There is no abdominal tenderness.  Musculoskeletal:        General: Tenderness present. No swelling.     Cervical back: Neck supple.  Skin:    General: Skin is warm and dry.     Capillary Refill: Capillary refill takes less than 2 seconds.  Neurological:     Mental Status: She is alert.  Psychiatric:        Mood and Affect: Mood normal.     ED Results and Treatments Labs (all labs ordered are listed, but only abnormal results are displayed) Labs Reviewed  CBC WITH DIFFERENTIAL/PLATELET - Abnormal; Notable for the  following components:      Result Value   WBC 11.0 (*)    RBC 5.48 (*)    Hemoglobin 17.0 (*)    HCT 50.5 (*)    Neutro Abs 8.7 (*)    All other components within normal limits  COMPREHENSIVE METABOLIC PANEL - Abnormal; Notable for the following components:   Potassium 3.3 (*)    Chloride 94 (*)    Glucose, Bld 140 (*)    Total Bilirubin 1.3 (*)    All other components within normal limits  LIPASE, BLOOD  URINALYSIS, ROUTINE W REFLEX MICROSCOPIC                                                                                                                          Radiology CT L-SPINE NO CHARGE  Result Date: 10/21/2022 CLINICAL DATA:  77 year old female with epigastric pain. Status post fall earlier this month. Left hip pain. Left lower extremity weakness. EXAM: CT LUMBAR SPINE WITH CONTRAST TECHNIQUE: Technique: Multiplanar CT images of the lumbar spine were reconstructed from contemporary CT of the Abdomen and Pelvis. RADIATION DOSE REDUCTION: This exam was performed according to the departmental dose-optimization program which includes automated exposure control, adjustment of the mA and/or kV according to patient size and/or use of iterative reconstruction technique. CONTRAST:  No additional COMPARISON:  CT Abdomen and Pelvis today reported separately. Recent lumbar spine CT 09/30/2022 FINDINGS: Segmentation: Transitional anatomy, 4 non rib-bearing lumbar type vertebral bodies. Previously lower lumbar fusion has been designated L4-L5 and the same numbering system is used today. This results in bilateral ribs at L1. Correlation with radiographs is recommended prior to any operative intervention. Alignment: Lumbar lordosis has not significantly changed. Mild to moderate levoconvex lumbar scoliosis is stable with apex at L2-L3. Mild chronic grade 1 anterolisthesis of L5 on S1 is stable. Vertebrae: Comminuted and unhealed L2 superior endplate fracture with central L2 loss of vertebral body  height up to 30% when compared to CTs from previous years (series 7, image 66). 10% additional loss of height since 09/30/2022. Mild retropulsion of the posterosuperior endplate, resulting in a roughly 30% spinal stenosis (series 3, image 29). L2 pedicles and posterior elements appear to remain intact and aligned. Visible lower thoracic levels, L1, and other lumbar levels appear stable and intact. Chronic posterior and interbody fusion hardware at L4-L5 is stable. Paraspinal and other soft tissues: Abdomen and pelvis are detailed separately today. There is faint L2 paraspinal soft tissue edema or contusion. Otherwise lumbar paraspinal soft tissues are negative. Disc levels: Advanced lumbar spine degeneration redemonstrated. Widespread lower thoracic and lumbar vacuum disc. Multifactorial lumbar spinal stenosis is maximal at L3-L4 (moderate to severe). Chronic L4-L5 decompression and fusion appears stable. IMPRESSION: 1. Transitional anatomy with ribs designated at L1 when utilizing the same numbering system as prior lumbar exams, previous decompression and fusion at L4-L5. Correlation with radiographs is recommended prior to any operative intervention. 2. Comminuted and unhealed L2 superior endplate fracture with 30% central loss of height (additional 10% loss since 09/30/2022) and mild retropulsion resulting in mild (30%) spinal stenosis there. No other complicating features. 3. No new osseous abnormality. Chronic Advanced lumbar spine degeneration with multifactorial spinal stenosis most pronounced at the L3-L4 adjacent segment (moderate to severe). 4. CT Abdomen and Pelvis today reported separately. Electronically Signed   By: Odessa Fleming M.D.   On: 10/21/2022 06:21   CT ABDOMEN PELVIS W CONTRAST  Result Date: 10/21/2022 CLINICAL DATA:  77 year old female with epigastric pain. Status post fall earlier this month. Left hip pain. Left lower extremity weakness. EXAM: CT ABDOMEN AND PELVIS WITH CONTRAST TECHNIQUE:  Multidetector CT imaging of the abdomen and pelvis was performed using the standard protocol following bolus administration of intravenous contrast. RADIATION DOSE REDUCTION: This exam was performed according to the departmental dose-optimization program which includes automated exposure control, adjustment of the mA and/or kV according to patient size and/or use of iterative reconstruction technique. CONTRAST:  75mL OMNIPAQUE IOHEXOL 350 MG/ML SOLN COMPARISON:  Lumbar spine CT today reported separately. CTA chest 10/04/2022 and CT Abdomen and Pelvis 10/03/2022. FINDINGS: Lower chest: Right lower lobe medial or posterior basal segment pulmonary artery embolus seen earlier this month is not apparent today on series 3, image 10. Stable cardiomegaly. No pericardial effusion. No pleural effusion. Stable mild lung base atelectasis or scarring. No consolidation or convincing pulmonary  infarct. Hepatobiliary: Stable liver. Chronic cholecystectomy. No bile duct enlargement. Pancreas: Negative. Spleen: Negative. Adrenals/Urinary Tract: Chronic benign right adrenal nodule present since at least 2012 and stable measuring 2 cm consistent with benign adenoma (no follow-up imaging recommended). Negative left adrenal gland. Nonobstructed kidneys with symmetric renal enhancement and contrast excretion (delayed images are under the lumbar study today). Small chronic and benign appearing renal cysts (no follow-up imaging recommended). No nephrolithiasis. No hydroureter. Unremarkable urinary bladder. Stomach/Bowel: No dilated large or small bowel. Severe diverticulosis of the large bowel from the splenic flexure through the redundant sigmoid colon. Redundant but decompressed transverse colon with less pronounced diverticula. Mild to moderate diverticulosis also of the right colon. No large bowel inflammation identified. Diminutive or absent appendix. Subtle ventral abdominal hernia of non incarcerated small bowel loops in an area of  left rectus muscle atrophy and diastasis (series 3, image 42), unchanged. Small gastric hiatal hernia is stable. Otherwise negative stomach and duodenum. No free air, free fluid, or mesenteric inflammation identified. Vascular/Lymphatic: Extensive Aortoiliac calcified atherosclerosis. Bulky mural plaque or thrombus in the proximal abdominal aorta on the left series 3, image 24 is stable. Major abdominal aortic branches remain patent despite atherosclerosis. Major pelvic and proximal femoral arteries remain patent. Portal venous system is patent. No lymphadenopathy identified. Reproductive: Surgically absent uterus. Diminutive or absent ovaries. Other: No pelvis free fluid. Musculoskeletal: Lumbar spine is detailed separately. Other visible osseous structures are stable. Bilateral sacrum, SI joints, pelvis, and proximal femurs appear stable and intact. No superficial soft tissue hematoma identified. IMPRESSION: 1. No acute or inflammatory findings in the abdomen or pelvis. See Lumbar spine CT detailed separately. 2. Chronic findings including Extensive Aortic Atherosclerosis (ICD10-I70.0), , Widespread large bowel diverticulosis, subtle ventral abdominal hernia with non-incarcerated small bowel (series 3, image 42), and small gastric hiatal hernia. Electronically Signed   By: Odessa Fleming M.D.   On: 10/21/2022 06:12   DG Hip Unilat W or Wo Pelvis 2-3 Views Left  Result Date: 10/21/2022 CLINICAL DATA:  Fall EXAM: DG HIP (WITH OR WITHOUT PELVIS) 2-3V LEFT COMPARISON:  CT and plain films  09/30/2022. FINDINGS: Hip joints and SI joints symmetric. Early osteoarthritis within the hip joints. No acute bony abnormality. Specifically, no fracture, subluxation, or dislocation. IMPRESSION: No acute bony abnormality. Electronically Signed   By: Charlett Nose M.D.   On: 10/21/2022 03:37    Pertinent labs & imaging results that were available during my care of the patient were reviewed by me and considered in my medical decision  making (see MDM for details).  Medications Ordered in ED Medications  apixaban (ELIQUIS) tablet 5 mg (has no administration in time range)  diltiazem (CARDIZEM CD) 24 hr capsule 120 mg (has no administration in time range)  losartan (COZAAR) tablet 25 mg (has no administration in time range)  polyethylene glycol (MIRALAX / GLYCOLAX) packet 17 g (has no administration in time range)  senna-docusate (Senokot-S) tablet 2 tablet (has no administration in time range)  magnesium gluconate (MAGONATE) tablet 500 mg (has no administration in time range)  metFORMIN (GLUCOPHAGE) tablet 500 mg (has no administration in time range)  ondansetron (ZOFRAN-ODT) disintegrating tablet 4 mg (4 mg Oral Given 10/21/22 0148)  lactated ringers bolus 1,000 mL (0 mLs Intravenous Stopped 10/21/22 0525)  ondansetron (ZOFRAN) injection 4 mg (4 mg Intravenous Given 10/21/22 0354)  fentaNYL (SUBLIMAZE) injection 50 mcg (50 mcg Intravenous Given 10/21/22 0354)  iohexol (OMNIPAQUE) 350 MG/ML injection 75 mL (75 mLs Intravenous Contrast Given 10/21/22 0554)  Procedures Procedures  (including critical care time)  Medical Decision Making / ED Course   This patient presents to the ED for concern of vomiting, back pain, flank pain, this involves an extensive number of treatment options, and is a complaint that carries with it a high risk of complications and morbidity.  The differential diagnosis includes fracture, ligamentous injury, hematoma, contusion, nephrolithiasis, pyelonephritis, obstruction  MDM: Patient seen emerged from for evaluation of multiple complaints as described above.  Physical exam with left CVA tenderness, left hip pain and L-spine tenderness to palpation but is otherwise unremarkable.  Laboratory evaluation with leukocytosis to 11.0, hemoglobin 17.0, potassium 3.3, chloride 94  but is otherwise unremarkable.  X-ray hip unremarkable.  CT abdomen pelvis is unremarkable.  CT L-spine showing a comminuted and unhealed L2 superior endplate fracture with mild spinal stenosis.  No new osseous abnormality.  I spoke briefly with Doran Durand NP from neurosurgery over epic chat who reviewed the patient's imaging and states that she is to continue her TLSO brace and follow-up outpatient.  I do suspect that the primary reason that the patient came the emergency department is that the husband is having difficulty taking care of her at home but I am unable to confirm this.  Patient will board in the emergency department until we can obtain additional history from family.  Patient signed out to oncoming provider.  Please see provider sentiment for continuation of workup.   Additional history obtained:  -External records from outside source obtained and reviewed including: Chart review including previous notes, labs, imaging, consultation notes   Lab Tests: -I ordered, reviewed, and interpreted labs.   The pertinent results include:   Labs Reviewed  CBC WITH DIFFERENTIAL/PLATELET - Abnormal; Notable for the following components:      Result Value   WBC 11.0 (*)    RBC 5.48 (*)    Hemoglobin 17.0 (*)    HCT 50.5 (*)    Neutro Abs 8.7 (*)    All other components within normal limits  COMPREHENSIVE METABOLIC PANEL - Abnormal; Notable for the following components:   Potassium 3.3 (*)    Chloride 94 (*)    Glucose, Bld 140 (*)    Total Bilirubin 1.3 (*)    All other components within normal limits  LIPASE, BLOOD  URINALYSIS, ROUTINE W REFLEX MICROSCOPIC       Imaging Studies ordered: I ordered imaging studies including hip x-ray, CT abdomen pelvis, CT L-spine I independently visualized and interpreted imaging. I agree with the radiologist interpretation   Medicines ordered and prescription drug management: Meds ordered this encounter  Medications   ondansetron (ZOFRAN-ODT) 4  MG disintegrating tablet    Darryll Capers J: cabinet override   ondansetron (ZOFRAN-ODT) disintegrating tablet 4 mg   lactated ringers bolus 1,000 mL   ondansetron (ZOFRAN) injection 4 mg   fentaNYL (SUBLIMAZE) injection 50 mcg   iohexol (OMNIPAQUE) 350 MG/ML injection 75 mL   apixaban (ELIQUIS) tablet 5 mg   diltiazem (CARDIZEM CD) 24 hr capsule 120 mg   losartan (COZAAR) tablet 25 mg   DISCONTD: metFORMIN (GLUCOPHAGE) tablet 500 mg   polyethylene glycol (MIRALAX / GLYCOLAX) packet 17 g   senna-docusate (Senokot-S) tablet 2 tablet   magnesium gluconate (MAGONATE) tablet 500 mg   metFORMIN (GLUCOPHAGE) tablet 500 mg    -I have reviewed the patients home medicines and have made adjustments as needed  Critical interventions none    Cardiac Monitoring: The patient was maintained on a cardiac monitor.  I personally viewed and interpreted the cardiac monitored which showed an underlying rhythm of: NSR  Social Determinants of Health:  Factors impacting patients care include: Recently discharged from SNF   Reevaluation: After the interventions noted above, I reevaluated the patient and found that they have :improved  Co morbidities that complicate the patient evaluation  Past Medical History:  Diagnosis Date   Arthritis    Cancer (HCC)    ovarian, skin face- skin cancer   Complication of anesthesia    slow to awaken everday, even slower with anesthesia   Constipation    Diabetes mellitus    Type II   GERD (gastroesophageal reflux disease)    Headache 05/12/2015   "after a fall" headache for 8 months   Hemorrhoids    Hernia    History of kidney stones    passed   Hypertension    Hypothyroidism    Loss of appetite    Muscle spasm    back   Osteoporosis    Redness    abd wound   Urine frequency    unable to hold urine    Wears hearing aid    bilateral       Dispostion: I considered admission for this patient, and disposition pending TST evaluation and  additional history from family.  Please see provider signout for continuation of workup     Final Clinical Impression(s) / ED Diagnoses Final diagnoses:  None     @PCDICTATION @    Glendora Score, MD 10/21/22 [phone]

## 2022-10-21 NOTE — Progress Notes (Deleted)
PT Cancellation Note  Patient Details Name: Stacy Kirk MRN: [id number] DOB: Nov 02, 1945   Cancelled Treatment:    Reason Eval/Treat Not Completed: Other (comment). PT attempted to see pt for evaluation however the pt is without her TLSO back brace. PT contacted pt's spouse who is unable to bring the brace to the hospital as he is caring for his disabled son. PT attempted to secure chat neurosurgery NP to gain further recommendations on allowable mobility without brace at this time, awaiting response.   Arlyss Gandy 10/21/2022, 11:17 AM

## 2022-10-21 NOTE — ED Notes (Signed)
Pt trailed off of oxygen, but desaturated down to 80% on room air. Pt placed back on oxygen immediately increasing oxygen saturation levels.

## 2022-10-21 NOTE — ED Notes (Signed)
Patient was placed on bedpan, but did not do anything in pan, she stated she thought she had to use it, she said maybe the meds she's taking making her feel like she has to go, but her brief was changed.

## 2022-10-21 NOTE — [place] FL2 (Signed)
[place] MEDICAID FL2 LEVEL OF CARE FORM     IDENTIFICATION  Patient Name: Stacy Kirk Birthdate: Aug 03, 1945 Sex: female Admission Date (Current Location): 10/21/2022  [facility] and [place] Number:  Producer, television/film/video and Address:  [facility],  [address], [place] [address]      Provider Number: [id number]  Attending Physician Name and Address:  Derwood Kaplan, MD  Relative Name and Phone Number:  Cope,[name] (Spouse)  [phone]    Current Level of Care: Hospital Recommended Level of Care: Skilled Nursing Facility Prior Approval Number:    Date Approved/Denied:   PASRR Number: [id number]  Discharge Plan: SNF    Current Diagnoses: Patient Active Problem List   Diagnosis Date Noted   Lumbar compression fracture (HCC) 10/02/2022   Closed compression fracture of L2 lumbar vertebra, initial encounter (HCC) 09/30/2022   Hypokalemia 09/30/2022   Hypertension 09/30/2022   Controlled type 2 diabetes mellitus with complication, without long-term current use of insulin (HCC) 08/05/2022   Chronic constipation 08/05/2022   OAB (overactive bladder) 08/05/2022   Hypothyroidism 08/05/2022   Fatigue 08/05/2022   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-L5 level 09/07/2016   Abdominal pain, RLQ 01/24/2011   Abdominal right lower quadrant swelling 01/24/2011   Low back pain 01/24/2011   Knee pain, right 01/24/2011    Orientation RESPIRATION BLADDER Height & Weight     Self, Place  Normal Continent Weight: 180 lb (81.6 kg) Height:  5' (152.4 cm)  BEHAVIORAL SYMPTOMS/MOOD NEUROLOGICAL BOWEL NUTRITION STATUS      Continent Diet (Regular)  AMBULATORY STATUS COMMUNICATION OF NEEDS Skin   Extensive Assist   Normal                       Personal Care Assistance Level of Assistance    Bathing Assistance: Maximum assistance Feeding assistance: Limited assistance Dressing Assistance: Maximum assistance     Functional Limitations Info  Sight, Hearing, Speech  Sight Info: Adequate Hearing Info: Adequate Speech Info: Adequate    SPECIAL CARE FACTORS FREQUENCY  PT (By licensed PT), OT (By licensed OT)     PT Frequency: x5/week OT Frequency: x5/week            Contractures      Additional Factors Info  Code Status, Allergies Code Status Info: Full Allergies Info: Erythromycin  Tramadol  Albumin (Human)  Aspirin  Codeine  Cortisone  Propoxyphene  Simvastatin  Triamcinolone Acetonide           Current Medications (10/21/2022):  This is the current hospital active medication list Current Facility-Administered Medications  Medication Dose Route Frequency Provider Last Rate Last Admin   apixaban (ELIQUIS) tablet 5 mg  5 mg Oral BID Rhunette Croft, Ankit, MD       diltiazem (CARDIZEM CD) 24 hr capsule 120 mg  120 mg Oral Daily Nanavati, Ankit, MD       losartan (COZAAR) tablet 25 mg  25 mg Oral Daily Nanavati, Ankit, MD       magnesium gluconate (MAGONATE) tablet 500 mg  500 mg Oral Daily Rhunette Croft, Ankit, MD       [START ON 10/22/2022] metFORMIN (GLUCOPHAGE) tablet 500 mg  500 mg Oral QPC supper Nanavati, Ankit, MD       polyethylene glycol (MIRALAX / GLYCOLAX) packet 17 g  17 g Oral BID Nanavati, Ankit, MD       senna-docusate (Senokot-S) tablet 2 tablet  2 tablet Oral BID Derwood Kaplan, MD  Current Outpatient Medications  Medication Sig Dispense Refill   acetaminophen (TYLENOL) 500 MG tablet Take 1,000 mg by mouth every 6 (six) hours as needed (FOR PAIN.).     apixaban (ELIQUIS) 5 MG TABS tablet Take 2 tablets (10 mg total) by mouth 2 (two) times daily for 2 days, THEN 1 tablet (5 mg total) 2 (two) times daily. 68 tablet 0   baclofen (LIORESAL) 20 MG tablet Take 0.5 tablets (10 mg total) by mouth daily as needed for muscle spasms. (Patient taking differently: Take 20 mg by mouth daily as needed for muscle spasms.) 30 each 2   diltiazem (CARDIZEM CD) 120 MG 24 hr capsule Take 120 mg by mouth daily.     docusate sodium (COLACE) 100 MG  capsule Take 100 mg by mouth daily at 8 pm.     HYDROcodone-acetaminophen (NORCO/VICODIN) 5-325 MG tablet Take 1-2 tablets by mouth every 4 (four) hours as needed for moderate pain. 5 tablet 0   Hydrocortisone (CORTIZONE-10 EX) Apply 1 application topically daily as needed (for itchy skin).     ibuprofen (ADVIL,MOTRIN) 200 MG tablet Take 400 mg by mouth every 8 (eight) hours as needed (FOR PAIN).     levothyroxine (SYNTHROID, LEVOTHROID) 100 MCG tablet Take 100 mcg by mouth daily before breakfast.      linaclotide (LINZESS) 72 MCG capsule Take 1 capsule (72 mcg total) by mouth daily before breakfast. (Patient not taking: Reported on 10/01/2022) 90 capsule 1   losartan (COZAAR) 25 MG tablet Take 1 tablet (25 mg total) by mouth daily. 30 tablet 0   magnesium gluconate (MAGONATE) 500 MG tablet Take 500 mg by mouth daily.     Menthol, Topical Analgesic, (BENGAY EX) Apply 1 application topically 4 (four) times daily as needed (for back pain).     metFORMIN (GLUCOPHAGE) 500 MG tablet Take 1 tablet (500 mg total) by mouth daily after supper. (Patient taking differently: Take 500 mg by mouth daily.) 90 tablet 1   Multiple Vitamin (MULTIVITAMIN WITH MINERALS) TABS tablet Take 1 tablet by mouth daily. ESSENTIAL ONE SUPPLEMENT     Multiple Vitamins-Minerals (HAIR/SKIN/NAILS PO) Take 1 capsule by mouth daily. HEALTHY HAIR,SKIN & NAILS     polyethylene glycol (MIRALAX / GLYCOLAX) 17 g packet Take 17 g by mouth 2 (two) times daily. 14 each 0   predniSONE (DELTASONE) 10 MG tablet Take 1 tablet (10 mg total) by mouth daily with breakfast. (Patient taking differently: Take 10 mg by mouth in the morning, at noon, and at bedtime.) 15 tablet 0   senna-docusate (SENOKOT-S) 8.6-50 MG tablet Take 2 tablets by mouth 2 (two) times daily. 30 tablet 0     Discharge Medications: Please see discharge summary for a list of discharge medications.  Relevant Imaging Results:  Relevant Lab Results:   Additional  Information SSN: [id number]  Princella Ion, LCSW

## 2022-10-21 NOTE — ED Triage Notes (Signed)
Pt was BIB [facility] d/t sliding out of her chair.  Pt states she has had left knee weakness for about 2 weeks and now has pain in left hip from the fall.   Pt also has N/V upon arrival,

## 2022-10-21 NOTE — ED Notes (Signed)
Pt received for care at this time.  This RN introduced self to pt.  Pt assisted with calling her husband per request.  Bed in lowest position, wheels locked.  Call bell within reach.

## 2022-10-21 NOTE — Progress Notes (Signed)
Transition of Care [facility]) - Emergency Department Mini Assessment   Patient Details  Name: Stacy Kirk MRN: [id number] Date of Birth: Jun 06, 1945  Transition of [facility]) CM/SW Contact:    Princella Ion, LCSW Phone Number: 10/21/2022, 8:02 AM   Clinical Narrative: Pt presented to ED for fall. Per chart review and spouse, pt was just discharged from [facility] after 10 days of rehab. This CSW inquired about whether pt has had any other rehab stays this year and spouse reported no. Spouse states he did have to pay a $20 copay for each day pt was in rehab. Spouse feels pt was discharged from facility too soon. If PT recommends SNF, [facility]. TOC following for PT eval.    ED Mini Assessment: What brought you to the Emergency Department? : fall  Barriers to Discharge: ED SNF auth     Means of departure: Not know       Patient Contact and Communications Key Contact 1: Spouse   Spoke with: Spouse Contact Date: 10/21/22,   Contact time: 0750 Contact Phone Number: [phone]           Admission diagnosis:  leg weakness Patient Active Problem List   Diagnosis Date Noted   Lumbar compression fracture (HCC) 10/02/2022   Closed compression fracture of L2 lumbar vertebra, initial encounter (HCC) 09/30/2022   Hypokalemia 09/30/2022   Hypertension 09/30/2022   Controlled type 2 diabetes mellitus with complication, without long-term current use of insulin (HCC) 08/05/2022   Chronic constipation 08/05/2022   OAB (overactive bladder) 08/05/2022   Hypothyroidism 08/05/2022   Fatigue 08/05/2022   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-L5 level 09/07/2016   Abdominal pain, RLQ 01/24/2011   Abdominal right lower quadrant swelling 01/24/2011   Low back pain 01/24/2011   Knee pain, right 01/24/2011   PCP:  Eden Emms, NP Pharmacy:   [facility] [phone] - [address],  - [address] [place] [address] Phone: [phone] Fax:  [phone]

## 2022-10-21 NOTE — ED Provider Notes (Addendum)
  Physical Exam  BP (!) 148/93 (BP Location: Right Arm)   Pulse 84   Temp 98.1 F (36.7 C) (Oral)   Resp 15   Ht 5' (1.524 m)   Wt 81.6 kg   SpO2 98%   BMI 35.15 kg/m   Physical Exam  Procedures  Procedures  ED Course / MDM    Medical Decision Making Amount and/or Complexity of Data Reviewed Labs: ordered. Radiology: ordered.  Risk Prescription drug management.   Pt comes in with cc of fall. She just left SNF last night. Mechanical fall yday. Old L spine fracture worse now. Pain control.  Derwood Kaplan, MD 10/21/22 0713   8:09 AM Attempted calling [phone] on 2 separate occasions.  Phone call continues to go to a voicemail.  TOC consult has been placed at this time. Patient is alert and oriented to self and location.  She does have some confusion.   Derwood Kaplan, MD 10/21/22 [phone]

## 2022-10-21 NOTE — Evaluation (Signed)
Physical Therapy Evaluation Patient Details Name: Stacy Kirk MRN: [id number] DOB: [birth year] Today's Date: 10/21/2022  History of Present Illness  77 y.o. female presents to [facility] hospital on 10/21/2022 with nausea, vomiting and a fall. PMH includes: obesity, arthritis, cancer, DM II, HTN, and hypothyroidism, L2 compression fx.  Clinical Impression  Pt presents to PT with deficits in functional mobility, gait, balance, strength, power, endurance. Pt is significantly limited by LLE pain, reported to shoot down L leg once initiating ambulation. Pt experiences knee buckling with this pain and requires significant assistance to return to bed to avoid potential falls. Due to poor tolerance for standing and out of bed mobility the pt remains at a high risk for recurrent falls. PT recommends short term inpatient PT services at the time of discharge.     Pt without TLSO in room, spouse unable to deliver brace to room. Neurosurgery NP reports ok to mobilize without brace for evaluation but recommended use of brace for future therapy sessions. Spouse encouraged to bring brace when able.      Assistance Recommended at Discharge Frequent or constant Supervision/Assistance  If plan is discharge home, recommend the following:  Can travel by private vehicle  A lot of help with walking and/or transfers;A lot of help with bathing/dressing/bathroom;Assistance with cooking/housework;Assist for transportation;Help with stairs or ramp for entrance   No    Equipment Recommendations  (defer to post-acute setting)  Recommendations for Other Services       Functional Status Assessment Patient has had a recent decline in their functional status and demonstrates the ability to make significant improvements in function in a reasonable and predictable amount of time.     Precautions / Restrictions Precautions Precautions: Fall;Back Precaution Booklet Issued: No Precaution Comments: verbally reviewed  precautions Required Braces or Orthoses: Spinal Brace Spinal Brace:  (pt without TLSO in room, spouse unable to deliver brace to room. Neurosurgery NP reports ok to mobilize without brace for evaluation but recommended use of brace for future therapy sessions) Restrictions Weight Bearing Restrictions: No      Mobility  Bed Mobility Overal bed mobility: Needs Assistance Bed Mobility: Rolling, Sidelying to Sit, Sit to Supine Rolling: Min assist Sidelying to sit: Min assist   Sit to supine: Total assist   General bed mobility comments: pt unable to scoot hips back onto bed, PT provides totalA for sit to supine for safety purposes    Transfers Overall transfer level: Needs assistance Equipment used: Rolling walker (2 wheels) Transfers: Sit to/from Stand Sit to Stand: Min guard, Mod assist, From elevated surface           General transfer comment: minG with RW for initial sit to stand, modA with 2nd attempt after LLE pain    Ambulation/Gait Ambulation/Gait assistance: Min assist Gait Distance (Feet): 4 Feet Assistive device: Rolling walker (2 wheels) Gait Pattern/deviations: Step-to pattern, Knees buckling Gait velocity: reduced Gait velocity interpretation: <1.31 ft/sec, indicative of household ambulator   General Gait Details: pt with L knee buckling associated with shooting pain down posterior LLE. Pt unable to tolerate further ambulation after this  Stairs            Wheelchair Mobility     Tilt Bed    Modified Rankin (Stroke Patients Only)       Balance Overall balance assessment: Needs assistance Sitting-balance support: Single extremity supported, Bilateral upper extremity supported, Feet unsupported Sitting balance-Leahy Scale: Poor     Standing balance support: Bilateral upper extremity supported, Reliant on  assistive device for balance Standing balance-Leahy Scale: Poor Standing balance comment: minA                              Pertinent Vitals/Pain Pain Assessment Pain Assessment: 0-10 Pain Score: 7  Pain Location: posterior LLE Pain Descriptors / Indicators: Shooting Pain Intervention(s): Monitored during session    Home Living Family/patient expects to be discharged to:: Private residence Living Arrangements: Spouse/significant other;Children Available Help at Discharge: Family;Available PRN/intermittently Type of Home: House Home Access: Ramped entrance       Home Layout: One level Home Equipment: Cane - single point;Grab bars - toilet;Grab bars - tub/shower;Rolling Walker (2 wheels)      Prior Function Prior Level of Function : Independent/Modified Independent             Mobility Comments: independent with PRN use of SPC at baseline, walking very short distances with RW at rehab ADLs Comments: pt reports unable to reach her feet and spouse wont assist. pt does not drive after eye surgery     Hand Dominance        Extremity/Trunk Assessment   Upper Extremity Assessment Upper Extremity Assessment: Generalized weakness    Lower Extremity Assessment Lower Extremity Assessment: Generalized weakness;LLE deficits/detail LLE Deficits / Details: pt with L knee buckling associated with sharp shooting pain down posterior LLE    Cervical / Trunk Assessment Cervical / Trunk Assessment: Kyphotic  Communication   Communication: No difficulties  Cognition Arousal/Alertness: Awake/alert Behavior During Therapy: WFL for tasks assessed/performed Overall Cognitive Status: No family/caregiver present to determine baseline cognitive functioning Area of Impairment: Following commands, Safety/judgement, Awareness                       Following Commands: Follows one step commands consistently Safety/Judgement: Decreased awareness of safety Awareness: Emergent            General Comments General comments (skin integrity, edema, etc.): VSS on RA    Exercises      Assessment/Plan    PT Assessment Patient needs continued PT services  PT Problem List Decreased strength;Decreased activity tolerance;Decreased balance;Decreased mobility;Decreased knowledge of use of DME;Decreased safety awareness;Pain       PT Treatment Interventions Gait training;DME instruction;Stair training;Functional mobility training;Therapeutic activities;Balance training;Therapeutic exercise;Neuromuscular re-education;Patient/family education    PT Goals (Current goals can be found in the Care Plan section)  Acute Rehab PT Goals Patient Stated Goal: to reduce pain, return to independence PT Goal Formulation: With patient Time For Goal Achievement: 11/04/22 Potential to Achieve Goals: Fair    Frequency Min 1X/week     Co-evaluation               AM-PAC PT "6 Clicks" Mobility  Outcome Measure Help needed turning from your back to your side while in a flat bed without using bedrails?: A Little Help needed moving from lying on your back to sitting on the side of a flat bed without using bedrails?: A Little Help needed moving to and from a bed to a chair (including a wheelchair)?: A Lot Help needed standing up from a chair using your arms (e.g., wheelchair or bedside chair)?: A Lot Help needed to walk in hospital room?: Total Help needed climbing 3-5 steps with a railing? : Total 6 Click Score: 12    End of Session   Activity Tolerance: Patient limited by pain Patient left: in bed;with call bell/phone within reach Nurse Communication:  Mobility status PT Visit Diagnosis: Other abnormalities of gait and mobility (R26.89);History of falling (Z91.81);Pain Pain - Right/Left: Left Pain - part of body: Leg    Time: 1056-1130 PT Time Calculation (min) (ACUTE ONLY): 34 min   Charges:   PT Evaluation $PT Eval Low Complexity: 1 Low   PT General Charges $$ ACUTE PT VISIT: 1 Visit         Arlyss Gandy, PT, DPT Acute Rehabilitation Office  [phone]   Arlyss Gandy 10/21/2022, 11:55 AM

## 2022-10-22 LAB — URINALYSIS, ROUTINE W REFLEX MICROSCOPIC
Bilirubin Urine: NEGATIVE
Glucose, UA: NEGATIVE mg/dL
Hgb urine dipstick: NEGATIVE
Ketones, ur: NEGATIVE mg/dL
Leukocytes,Ua: NEGATIVE
Nitrite: NEGATIVE
Protein, ur: NEGATIVE mg/dL
Specific Gravity, Urine: 1.026 (ref 1.005–1.030)
pH: 5 (ref 5.0–8.0)

## 2022-10-22 NOTE — ED Provider Notes (Signed)
Emergency Medicine Observation Re-evaluation Note  Stacy Kirk is a 77 y.o. female, seen on rounds today.  Pt initially presented to the ED for complaints of Fall Currently, the patient is asleep.  When I woke her up, she indicated that she is feeling well.  She has no complaints.  Physical Exam  BP (!) 109/51   Pulse 76   Temp 98.2 F (36.8 C) (Oral)   Resp 16   Ht 5' (1.524 m)   Wt 81.6 kg   SpO2 95%   BMI 35.15 kg/m  Physical Exam General: No acute distress Cardiac: Regular rate Lungs: No respiratory distress Psych: Currently patient is calm  ED Course / MDM  EKG:   I have reviewed the labs performed to date as well as medications administered while in observation.  Recent changes in the last 24 hours include -seen yesterday for fall.  Was just discharged from the hospital and then rehab facility.  Fell within 24 hours and return to the ER.  A safe discharge at this time.  Social work, PT has seen the patient.  Patient's UA still pending.  Nursing staff advised to collect urine.    Plan  Current plan is for patient to be placed to a nursing home.    Derwood Kaplan, MD 10/22/22 1233

## 2022-10-22 NOTE — Progress Notes (Signed)
Pt's auth still pending.    Valentina Shaggy., MSW, LCSWA [facility]  Transitions of Care Clinical Social Worker I Direct Dial: [phone]  Fax: [phone] [email or web address]

## 2022-10-23 MED ORDER — IBUPROFEN 400 MG PO TABS: 400.0000 mg | ORAL_TABLET | Freq: Four times a day (QID) | ORAL | Status: DC | PRN

## 2022-10-23 MED ORDER — PREDNISONE 20 MG PO TABS
10.0000 mg | ORAL_TABLET | Freq: Every day | ORAL | Status: DC
  Administered 2022-10-24: 10 mg via ORAL
  Filled 2022-10-23: qty 1

## 2022-10-23 MED ORDER — LEVOTHYROXINE SODIUM 100 MCG PO TABS
100.0000 ug | ORAL_TABLET | Freq: Every day | ORAL | Status: DC
  Administered 2022-10-24: 100 ug via ORAL
  Filled 2022-10-23: qty 1

## 2022-10-23 MED ORDER — ACETAMINOPHEN 500 MG PO TABS
1000.0000 mg | ORAL_TABLET | Freq: Four times a day (QID) | ORAL | Status: DC | PRN
  Administered 2022-10-24: 1000 mg via ORAL
  Filled 2022-10-23: qty 2

## 2022-10-23 MED ORDER — BACLOFEN 10 MG PO TABS: 10.0000 mg | ORAL_TABLET | Freq: Every day | ORAL | Status: DC | PRN

## 2022-10-23 NOTE — Progress Notes (Signed)
Pt's insurance [employer] was approved auth [id number], 10/21/22-10/25/22. CSW attempted to contact [facility] administrator Alvino Chapel [phone]) to confirm pt can be admitted today, no answer left a HIPAA complaint VM requesting a return call. TOC to follow.   Valentina Shaggy., MSW, LCSWA [facility]  Transitions of Care Clinical Social Worker I Direct Dial: [phone]  Fax: [phone] [email or web address]

## 2022-10-23 NOTE — ED Notes (Signed)
Patient c/o difficulty breathing in hallway. Patient is 94% on room air but does use oxygen occasionally. Patient requesting to be placed in room. Patient advised we have none available and offered to place back on oxygen via [place]. Patient refused oxygen . Patient given water.

## 2022-10-23 NOTE — ED Provider Notes (Signed)
Emergency Medicine Observation Re-evaluation Note  Stacy Kirk is a 77 y.o. female, seen on rounds today.  Pt initially presented to the ED for complaints of Fall Currently, the patient is resting comfortably.  She has finished her breakfast, and expresses little frustration about not being able to walk around.  Physical Exam  BP 138/85   Pulse 69   Temp 98.1 F (36.7 C) (Oral)   Resp 17   Ht 5' (1.524 m)   Wt 81.6 kg   SpO2 95%   BMI 35.15 kg/m  Physical Exam General: Calm Cardiac: Regular rate Lungs: Lungs are clear Psych: Currently calm  ED Course / MDM  EKG:   I have reviewed the labs performed to date as well as medications administered while in observation.  Recent changes in the last 24 hours include -no new changes. Patient is still awaiting placement to a rehab facility. I have checked her urine analysis, it is negative for any evidence of infection.  Patient denies any burning with urination.  Plan  Current plan is for holding patient for bed placement.    Derwood Kaplan, MD 10/23/22 [phone]

## 2022-10-23 NOTE — Evaluation (Signed)
Occupational Therapy Evaluation Patient Details Name: Stacy Kirk MRN: [id number] DOB: 11/28/1945 Today's Date: 10/23/2022   History of Present Illness 77 y.o. female presents to [facility] hospital on 10/21/2022 with nausea, vomiting and a fall. PMH includes: obesity, arthritis, cancer, DM II, HTN, and hypothyroidism, L2 compression fx.   Clinical Impression   PT admitted with s/p fall. Pt currently with functional limitiations due to the deficits listed below (see OT problem list). Pt reports that she was not wearing back brace due to not being able to get it on or off. Pt reports spouse will not bring the brace due to caring for son. Pt very fearful of falling and likely to do better with female therapist. Pt states "you are half the size of my husband what are you going to do to help me" Pt declines to attempt oob with therapist.  Pt will benefit from skilled OT to increase their independence and safety with adls and balance to allow discharge skilled inpatient follow up therapy, <3 hours/day. .       Recommendations for follow up therapy are one component of a multi-disciplinary discharge planning process, led by the attending physician.  Recommendations may be updated based on patient status, additional functional criteria and insurance authorization.   Assistance Recommended at Discharge Frequent or constant Supervision/Assistance  Patient can return home with the following Two people to help with walking and/or transfers;Two people to help with bathing/dressing/bathroom    Functional Status Assessment  Patient has had a recent decline in their functional status and demonstrates the ability to make significant improvements in function in a reasonable and predictable amount of time.  Equipment Recommendations  Wheelchair (measurements OT);Wheelchair cushion (measurements OT);Hospital bed    Recommendations for Other Services       Precautions / Restrictions Precautions Precautions:  Fall;Back Precaution Booklet Issued: No Precaution Comments: verbally reviewed precautions Required Braces or Orthoses: Spinal Brace Spinal Brace: Thoracolumbosacral orthotic;Applied in sitting position (pt without TLSO in room, spouse unable to deliver brace to room. Neurosurgery NP reports ok to mobilize without brace for evaluation but recommended use of brace for future therapy sessions) Restrictions Weight Bearing Restrictions: No      Mobility Bed Mobility Overal bed mobility: Needs Assistance Bed Mobility: Rolling, Sidelying to Sit, Sit to Supine Rolling: Mod assist Sidelying to sit: Mod assist   Sit to supine: Max assist   General bed mobility comments: pt needs (A) to bring bil LE off the bed surface, tranisition trunk into sitting and back to bed surface    Transfers                   General transfer comment: declined to participate      Balance Overall balance assessment: Needs assistance Sitting-balance support: Single extremity supported, Bilateral upper extremity supported, Feet unsupported Sitting balance-Leahy Scale: Poor                                     ADL either performed or assessed with clinical judgement   ADL Overall ADL's : Needs assistance/impaired Eating/Feeding: Independent   Grooming: Set up;Bed level   Upper Body Bathing: Moderate assistance   Lower Body Bathing: Total assistance   Upper Body Dressing : Moderate assistance   Lower Body Dressing: Total assistance     Toilet Transfer Details (indicate cue type and reason): unwilling to (A) with transfer. pt incontinence with tech cleaning her  up at bed level on arrival           General ADL Comments: pt bed level and fearful of falling. pt states ' you are half the size of my husband how do you plan to help me" does not want to attmept standing with OT     Vision Baseline Vision/History: 1 Wears glasses Ability to See in Adequate Light: 1  Impaired Patient Visual Report: No change from baseline       Perception     Praxis      Pertinent Vitals/Pain Pain Assessment Pain Assessment: Faces Faces Pain Scale: Hurts a little bit Pain Location: sore Pain Descriptors / Indicators: Grimacing Pain Intervention(s): Monitored during session, Repositioned     Hand Dominance Right   Extremity/Trunk Assessment Upper Extremity Assessment Upper Extremity Assessment: Generalized weakness   Lower Extremity Assessment Lower Extremity Assessment: Defer to PT evaluation LLE Deficits / Details: generalized weakness   Cervical / Trunk Assessment Cervical / Trunk Assessment: Kyphotic   Communication Communication Communication: No difficulties   Cognition Arousal/Alertness: Awake/alert Behavior During Therapy: WFL for tasks assessed/performed Overall Cognitive Status: No family/caregiver present to determine baseline cognitive functioning Area of Impairment: Following commands, Safety/judgement, Awareness, Attention, Memory                 Orientation Level: Disoriented to, Place, Time (i have not idea what time or day it is in this placei cant keep up) Current Attention Level: Sustained Memory: Decreased short-term memory Following Commands: Follows one step commands inconsistently, Follows one step commands with increased time Safety/Judgement: Decreased awareness of safety Awareness: Emergent Problem Solving: Slow processing, Difficulty sequencing General Comments: pt unable to tell me if she was at a facility prior. pt was unable to give details to care of son by spouse. pt states "i dont know" Pt also made the comment "if he can even get him cleaned up"     General Comments  90% RA with needing to blow nose multiple times    Exercises     Shoulder Instructions      Home Living Family/patient expects to be discharged to:: Private residence Living Arrangements: Spouse/significant other;Children Available Help  at Discharge: Family;Available PRN/intermittently Type of Home: House Home Access: Ramped entrance     Home Layout: One level     Bathroom Shower/Tub: Producer, television/film/video: Handicapped height     Home Equipment: Cane - single point;Grab bars - toilet;Grab bars - tub/shower;Rolling Walker (2 wheels)   Additional Comments: reports that husband cares for son with disabilities at home and cant come      Prior Functioning/Environment Prior Level of Function : Independent/Modified Independent             Mobility Comments: independent with PRN use of SPC at baseline, walking very short distances with RW at rehab ADLs Comments: pt reports unable to reach her feet and spouse wont assist. pt does not drive after eye surgery        OT Problem List: Decreased strength;Decreased range of motion;Decreased activity tolerance;Impaired balance (sitting and/or standing);Decreased safety awareness;Decreased knowledge of use of DME or AE;Pain      OT Treatment/Interventions: Self-care/ADL training;Therapeutic exercise;Energy conservation;DME and/or AE instruction;Therapeutic activities;Cognitive remediation/compensation;Patient/family education;Balance training;Manual therapy    OT Goals(Current goals can be found in the care plan section) Acute Rehab OT Goals Patient Stated Goal: none states by patient OT Goal Formulation: Patient unable to participate in goal setting Time For Goal Achievement: 11/06/22 Potential to  Achieve Goals: Good  OT Frequency: Min 1X/week    Co-evaluation              AM-PAC OT "6 Clicks" Daily Activity     Outcome Measure Help from another person eating meals?: None Help from another person taking care of personal grooming?: A Little Help from another person toileting, which includes using toliet, bedpan, or urinal?: A Lot Help from another person bathing (including washing, rinsing, drying)?: A Lot Help from another person to put on and  taking off regular upper body clothing?: A Lot Help from another person to put on and taking off regular lower body clothing?: A Lot 6 Click Score: 15   End of Session Nurse Communication: Mobility status;Precautions  Activity Tolerance: Patient tolerated treatment well Patient left: in bed;with call bell/phone within reach;Other (comment) (returned to hallway from room 46 where pt was assigned and RN notified)  OT Visit Diagnosis: Unsteadiness on feet (R26.81);Other abnormalities of gait and mobility (R26.89);Muscle weakness (generalized) (M62.81);History of falling (Z91.81);Pain                Time: 0981-1914 OT Time Calculation (min): 21 min Charges:  OT General Charges $OT Visit: 1 Visit OT Evaluation $OT Eval Moderate Complexity: 1 Mod   Brynn, OTR/L  Acute Rehabilitation Services Office: [phone] .   Mateo Flow 10/23/2022, 10:34 AM

## 2022-10-23 NOTE — ED Notes (Signed)
Pt moved into an available room for linen change and pt to be placed on bed pan.

## 2022-10-23 NOTE — ED Notes (Signed)
Pt received for care at 1900.  Bed in lowest position, wheels locked.  Call bell within reach.

## 2022-10-23 NOTE — ED Notes (Signed)
Patient asleep in hall respirations even and non labored sats 93% ra

## 2022-10-23 NOTE — ED Notes (Signed)
Patient remains asleep oygen at 91 % ra

## 2022-10-23 NOTE — ED Notes (Signed)
Pt endorses nasal/sinus discomfort.  This RN offered to try humidified oxygen for pt's nasal cannula; pt agreeable to same.

## 2022-10-23 NOTE — ED Notes (Signed)
Pt moved to an available room for linen change to be placed on the bedpan.

## 2022-10-24 ENCOUNTER — Emergency Department (HOSPITAL_COMMUNITY): Payer: Medicare HMO

## 2022-10-24 DIAGNOSIS — Z7401 Bed confinement status: Secondary | ICD-10-CM | POA: Diagnosis not present

## 2022-10-24 DIAGNOSIS — S32000A Wedge compression fracture of unspecified lumbar vertebra, initial encounter for closed fracture: Secondary | ICD-10-CM | POA: Diagnosis not present

## 2022-10-24 DIAGNOSIS — R278 Other lack of coordination: Secondary | ICD-10-CM | POA: Diagnosis not present

## 2022-10-24 DIAGNOSIS — I82401 Acute embolism and thrombosis of unspecified deep veins of right lower extremity: Secondary | ICD-10-CM | POA: Diagnosis not present

## 2022-10-24 DIAGNOSIS — S3330XA Dislocation of unspecified parts of lumbar spine and pelvis, initial encounter: Secondary | ICD-10-CM | POA: Diagnosis not present

## 2022-10-24 DIAGNOSIS — I110 Hypertensive heart disease with heart failure: Secondary | ICD-10-CM | POA: Diagnosis not present

## 2022-10-24 DIAGNOSIS — Z85828 Personal history of other malignant neoplasm of skin: Secondary | ICD-10-CM | POA: Diagnosis not present

## 2022-10-24 DIAGNOSIS — R0989 Other specified symptoms and signs involving the circulatory and respiratory systems: Secondary | ICD-10-CM | POA: Diagnosis not present

## 2022-10-24 DIAGNOSIS — I10 Essential (primary) hypertension: Secondary | ICD-10-CM | POA: Diagnosis not present

## 2022-10-24 DIAGNOSIS — R531 Weakness: Secondary | ICD-10-CM | POA: Diagnosis not present

## 2022-10-24 DIAGNOSIS — S32000D Wedge compression fracture of unspecified lumbar vertebra, subsequent encounter for fracture with routine healing: Secondary | ICD-10-CM | POA: Diagnosis not present

## 2022-10-24 DIAGNOSIS — M1712 Unilateral primary osteoarthritis, left knee: Secondary | ICD-10-CM | POA: Diagnosis not present

## 2022-10-24 DIAGNOSIS — E119 Type 2 diabetes mellitus without complications: Secondary | ICD-10-CM | POA: Diagnosis not present

## 2022-10-24 DIAGNOSIS — M549 Dorsalgia, unspecified: Secondary | ICD-10-CM | POA: Diagnosis not present

## 2022-10-24 DIAGNOSIS — R079 Chest pain, unspecified: Secondary | ICD-10-CM | POA: Diagnosis not present

## 2022-10-24 DIAGNOSIS — Z9181 History of falling: Secondary | ICD-10-CM | POA: Diagnosis not present

## 2022-10-24 DIAGNOSIS — R0602 Shortness of breath: Secondary | ICD-10-CM | POA: Diagnosis not present

## 2022-10-24 DIAGNOSIS — Z86718 Personal history of other venous thrombosis and embolism: Secondary | ICD-10-CM | POA: Diagnosis not present

## 2022-10-24 DIAGNOSIS — E1169 Type 2 diabetes mellitus with other specified complication: Secondary | ICD-10-CM | POA: Diagnosis not present

## 2022-10-24 DIAGNOSIS — M25562 Pain in left knee: Secondary | ICD-10-CM | POA: Diagnosis not present

## 2022-10-24 DIAGNOSIS — Z8543 Personal history of malignant neoplasm of ovary: Secondary | ICD-10-CM | POA: Diagnosis not present

## 2022-10-24 DIAGNOSIS — M545 Low back pain, unspecified: Secondary | ICD-10-CM | POA: Diagnosis not present

## 2022-10-24 DIAGNOSIS — M546 Pain in thoracic spine: Secondary | ICD-10-CM | POA: Diagnosis not present

## 2022-10-24 DIAGNOSIS — R112 Nausea with vomiting, unspecified: Secondary | ICD-10-CM | POA: Diagnosis not present

## 2022-10-24 DIAGNOSIS — N390 Urinary tract infection, site not specified: Secondary | ICD-10-CM | POA: Diagnosis not present

## 2022-10-24 DIAGNOSIS — Z7901 Long term (current) use of anticoagulants: Secondary | ICD-10-CM | POA: Diagnosis not present

## 2022-10-24 DIAGNOSIS — R2689 Other abnormalities of gait and mobility: Secondary | ICD-10-CM | POA: Diagnosis not present

## 2022-10-24 DIAGNOSIS — R051 Acute cough: Secondary | ICD-10-CM | POA: Diagnosis not present

## 2022-10-24 DIAGNOSIS — I129 Hypertensive chronic kidney disease with stage 1 through stage 4 chronic kidney disease, or unspecified chronic kidney disease: Secondary | ICD-10-CM | POA: Diagnosis not present

## 2022-10-24 DIAGNOSIS — Z86711 Personal history of pulmonary embolism: Secondary | ICD-10-CM | POA: Diagnosis not present

## 2022-10-24 DIAGNOSIS — R296 Repeated falls: Secondary | ICD-10-CM | POA: Diagnosis not present

## 2022-10-24 DIAGNOSIS — M6281 Muscle weakness (generalized): Secondary | ICD-10-CM | POA: Diagnosis not present

## 2022-10-24 DIAGNOSIS — D649 Anemia, unspecified: Secondary | ICD-10-CM | POA: Diagnosis not present

## 2022-10-24 DIAGNOSIS — E039 Hypothyroidism, unspecified: Secondary | ICD-10-CM | POA: Diagnosis not present

## 2022-10-24 DIAGNOSIS — I509 Heart failure, unspecified: Secondary | ICD-10-CM | POA: Diagnosis not present

## 2022-10-24 DIAGNOSIS — M542 Cervicalgia: Secondary | ICD-10-CM | POA: Diagnosis not present

## 2022-10-24 DIAGNOSIS — E1165 Type 2 diabetes mellitus with hyperglycemia: Secondary | ICD-10-CM | POA: Diagnosis not present

## 2022-10-24 NOTE — ED Notes (Signed)
Pt O2 saturations decreasing while asleep. Pt placed on 2L O2 with improvement.

## 2022-10-24 NOTE — ED Notes (Signed)
[facility] called and notified to transport patient to [facility].

## 2022-10-24 NOTE — ED Notes (Signed)
Gave report to Angelica Chessman LPN at [facility]

## 2022-10-24 NOTE — Progress Notes (Signed)
CSW spoke with Stacy Kirk in admissions at [facility] who can take patient today. Patient will be going to room 802A. CSW notified patients RN and ED provider.

## 2022-10-24 NOTE — Discharge Instructions (Signed)
Stacy Kirk was seen in the emergency room after she had a fall at home. She was discharged from the hospital on July 15 originally.  At that time she had a fall and was found to have mild lumbar spine fracture, bladder infection and blood clots in the legs and lungs.  She was discharged to a rehab facility and discharged home -but returned to the emergency room within 24 hours after discharge with a fall.  Subsequently her CT scan showed worsening lumbar spine fracture.  She has been assessed by physical therapy staff and is stable for discharge, to rehab facility for increased fall risk and weakness.

## 2022-10-24 NOTE — ED Provider Notes (Addendum)
Emergency Medicine Observation Re-evaluation Note  Stacy Kirk is a 77 y.o. female, seen on rounds today.  Pt initially presented to the ED for complaints of Fall Currently, the patient is resting comfortably.  Physical Exam  BP (!) 140/92 (BP Location: Right Arm)   Pulse 82   Temp 98.6 F (37 C) (Oral)   Resp 16   Ht 5' (1.524 m)   Wt 81.6 kg   SpO2 95%   BMI 35.15 kg/m  Physical Exam General: No acute distress Cardiac: Regular rate Lungs: No respiratory distress Psych: Currently calm  ED Course / MDM  EKG:   I have reviewed the labs performed to date as well as medications administered while in observation.  Recent changes in the last 24 hours include -patient accepted to nursing home.  AVS will be completed.  Plan  Current plan is for discharge to nursing home today.    Derwood Kaplan, MD 10/24/22 0953  10:20 AM Nursing staff requested me to reassess the patient. Patient indicates that she is still not sure why she fell. Her leg gave out and she continues to have l hip, knee and leg pain and feels that she can't walk.  I have reviewed previous workup and pt had normal CT pelvis and xray L hip. She has pain and weakness with raising le. Upper extremity strength is normal and sensory exam is normal. Knee is swollen, will get xray L knee.     PT eval is as below: Mobility   Bed Mobility Overal bed mobility: Needs Assistance Bed Mobility: Rolling, Sidelying to Sit, Sit to Supine Rolling: Min assist Sidelying to sit: Min assist Sit to supine: Total assist General bed mobility comments: pt unable to scoot hips back onto bed, PT provides totalA for sit to supine for safety purposes   Transfers Overall transfer level: Needs assistance Equipment used: Rolling walker (2 wheels) Transfers: Sit to/from Stand Sit to Stand: Min guard, Mod assist, From elevated surface General transfer comment: minG with RW for initial sit to stand, modA with 2nd attempt after LLE  pain   Ambulation/Gait Ambulation/Gait assistance: Min assist Gait Distance (Feet): 4 Feet Assistive device: Rolling walker (2 wheels) Gait Pattern/deviations: Step-to pattern, Knees buckling Gait velocity: reduced Gait velocity interpretation: <1.31 ft/sec, indicative of household ambulator General Gait Details: pt with L knee buckling associated with shooting pain down posterior LLE. Pt unable to tolerate further ambulation after this     Pt still hasn't had family get her tlso. She states that she didn't like it. I advised that when they come to visit, she still try it one.    Derwood Kaplan, MD 10/24/22 1023

## 2022-10-25 DIAGNOSIS — M6281 Muscle weakness (generalized): Secondary | ICD-10-CM | POA: Diagnosis not present

## 2022-10-25 DIAGNOSIS — S32000D Wedge compression fracture of unspecified lumbar vertebra, subsequent encounter for fracture with routine healing: Secondary | ICD-10-CM | POA: Diagnosis not present

## 2022-10-25 DIAGNOSIS — E039 Hypothyroidism, unspecified: Secondary | ICD-10-CM | POA: Diagnosis not present

## 2022-10-25 DIAGNOSIS — R2689 Other abnormalities of gait and mobility: Secondary | ICD-10-CM | POA: Diagnosis not present

## 2022-10-25 DIAGNOSIS — I110 Hypertensive heart disease with heart failure: Secondary | ICD-10-CM | POA: Diagnosis not present

## 2022-10-25 DIAGNOSIS — R296 Repeated falls: Secondary | ICD-10-CM | POA: Diagnosis not present

## 2022-10-25 DIAGNOSIS — I129 Hypertensive chronic kidney disease with stage 1 through stage 4 chronic kidney disease, or unspecified chronic kidney disease: Secondary | ICD-10-CM | POA: Diagnosis not present

## 2022-10-25 DIAGNOSIS — Z9181 History of falling: Secondary | ICD-10-CM | POA: Diagnosis not present

## 2022-10-25 DIAGNOSIS — Z86718 Personal history of other venous thrombosis and embolism: Secondary | ICD-10-CM | POA: Diagnosis not present

## 2022-10-25 DIAGNOSIS — E1169 Type 2 diabetes mellitus with other specified complication: Secondary | ICD-10-CM | POA: Diagnosis not present

## 2022-10-25 DIAGNOSIS — R278 Other lack of coordination: Secondary | ICD-10-CM | POA: Diagnosis not present

## 2022-10-25 DIAGNOSIS — Z86711 Personal history of pulmonary embolism: Secondary | ICD-10-CM | POA: Diagnosis not present

## 2022-10-27 DIAGNOSIS — I10 Essential (primary) hypertension: Secondary | ICD-10-CM | POA: Diagnosis not present

## 2022-10-27 DIAGNOSIS — R296 Repeated falls: Secondary | ICD-10-CM | POA: Diagnosis not present

## 2022-10-27 DIAGNOSIS — E039 Hypothyroidism, unspecified: Secondary | ICD-10-CM | POA: Diagnosis not present

## 2022-10-27 DIAGNOSIS — Z86718 Personal history of other venous thrombosis and embolism: Secondary | ICD-10-CM | POA: Diagnosis not present

## 2022-10-27 DIAGNOSIS — M6281 Muscle weakness (generalized): Secondary | ICD-10-CM | POA: Diagnosis not present

## 2022-10-27 DIAGNOSIS — I110 Hypertensive heart disease with heart failure: Secondary | ICD-10-CM | POA: Diagnosis not present

## 2022-10-27 DIAGNOSIS — E1169 Type 2 diabetes mellitus with other specified complication: Secondary | ICD-10-CM | POA: Diagnosis not present

## 2022-10-27 DIAGNOSIS — S32000D Wedge compression fracture of unspecified lumbar vertebra, subsequent encounter for fracture with routine healing: Secondary | ICD-10-CM | POA: Diagnosis not present

## 2022-10-27 DIAGNOSIS — Z86711 Personal history of pulmonary embolism: Secondary | ICD-10-CM | POA: Diagnosis not present

## 2022-10-28 DIAGNOSIS — R296 Repeated falls: Secondary | ICD-10-CM | POA: Diagnosis not present

## 2022-10-28 DIAGNOSIS — R278 Other lack of coordination: Secondary | ICD-10-CM | POA: Diagnosis not present

## 2022-10-28 DIAGNOSIS — I110 Hypertensive heart disease with heart failure: Secondary | ICD-10-CM | POA: Diagnosis not present

## 2022-10-28 DIAGNOSIS — E1165 Type 2 diabetes mellitus with hyperglycemia: Secondary | ICD-10-CM | POA: Diagnosis not present

## 2022-10-28 DIAGNOSIS — Z9181 History of falling: Secondary | ICD-10-CM | POA: Diagnosis not present

## 2022-10-28 DIAGNOSIS — S32000A Wedge compression fracture of unspecified lumbar vertebra, initial encounter for closed fracture: Secondary | ICD-10-CM | POA: Diagnosis not present

## 2022-10-28 DIAGNOSIS — E039 Hypothyroidism, unspecified: Secondary | ICD-10-CM | POA: Diagnosis not present

## 2022-10-28 DIAGNOSIS — M6281 Muscle weakness (generalized): Secondary | ICD-10-CM | POA: Diagnosis not present

## 2022-10-28 DIAGNOSIS — R2689 Other abnormalities of gait and mobility: Secondary | ICD-10-CM | POA: Diagnosis not present

## 2022-10-28 DIAGNOSIS — E1169 Type 2 diabetes mellitus with other specified complication: Secondary | ICD-10-CM | POA: Diagnosis not present

## 2022-10-28 DIAGNOSIS — S32000D Wedge compression fracture of unspecified lumbar vertebra, subsequent encounter for fracture with routine healing: Secondary | ICD-10-CM | POA: Diagnosis not present

## 2022-10-28 DIAGNOSIS — I82401 Acute embolism and thrombosis of unspecified deep veins of right lower extremity: Secondary | ICD-10-CM | POA: Diagnosis not present

## 2022-10-31 DIAGNOSIS — E039 Hypothyroidism, unspecified: Secondary | ICD-10-CM | POA: Diagnosis not present

## 2022-10-31 DIAGNOSIS — R051 Acute cough: Secondary | ICD-10-CM | POA: Diagnosis not present

## 2022-10-31 DIAGNOSIS — M6281 Muscle weakness (generalized): Secondary | ICD-10-CM | POA: Diagnosis not present

## 2022-10-31 DIAGNOSIS — R0989 Other specified symptoms and signs involving the circulatory and respiratory systems: Secondary | ICD-10-CM | POA: Diagnosis not present

## 2022-10-31 DIAGNOSIS — E1169 Type 2 diabetes mellitus with other specified complication: Secondary | ICD-10-CM | POA: Diagnosis not present

## 2022-10-31 DIAGNOSIS — R0602 Shortness of breath: Secondary | ICD-10-CM | POA: Diagnosis not present

## 2022-10-31 DIAGNOSIS — I110 Hypertensive heart disease with heart failure: Secondary | ICD-10-CM | POA: Diagnosis not present

## 2022-10-31 DIAGNOSIS — Z86711 Personal history of pulmonary embolism: Secondary | ICD-10-CM | POA: Diagnosis not present

## 2022-10-31 DIAGNOSIS — R296 Repeated falls: Secondary | ICD-10-CM | POA: Diagnosis not present

## 2022-10-31 DIAGNOSIS — I129 Hypertensive chronic kidney disease with stage 1 through stage 4 chronic kidney disease, or unspecified chronic kidney disease: Secondary | ICD-10-CM | POA: Diagnosis not present

## 2022-10-31 DIAGNOSIS — S32000D Wedge compression fracture of unspecified lumbar vertebra, subsequent encounter for fracture with routine healing: Secondary | ICD-10-CM | POA: Diagnosis not present

## 2022-11-01 DIAGNOSIS — E1165 Type 2 diabetes mellitus with hyperglycemia: Secondary | ICD-10-CM | POA: Diagnosis not present

## 2022-11-01 DIAGNOSIS — E039 Hypothyroidism, unspecified: Secondary | ICD-10-CM | POA: Diagnosis not present

## 2022-11-01 DIAGNOSIS — E1169 Type 2 diabetes mellitus with other specified complication: Secondary | ICD-10-CM | POA: Diagnosis not present

## 2022-11-01 DIAGNOSIS — M6281 Muscle weakness (generalized): Secondary | ICD-10-CM | POA: Diagnosis not present

## 2022-11-01 DIAGNOSIS — S32000A Wedge compression fracture of unspecified lumbar vertebra, initial encounter for closed fracture: Secondary | ICD-10-CM | POA: Diagnosis not present

## 2022-11-01 DIAGNOSIS — R2689 Other abnormalities of gait and mobility: Secondary | ICD-10-CM | POA: Diagnosis not present

## 2022-11-01 DIAGNOSIS — I110 Hypertensive heart disease with heart failure: Secondary | ICD-10-CM | POA: Diagnosis not present

## 2022-11-01 DIAGNOSIS — Z9181 History of falling: Secondary | ICD-10-CM | POA: Diagnosis not present

## 2022-11-01 DIAGNOSIS — D649 Anemia, unspecified: Secondary | ICD-10-CM | POA: Diagnosis not present

## 2022-11-01 DIAGNOSIS — I82401 Acute embolism and thrombosis of unspecified deep veins of right lower extremity: Secondary | ICD-10-CM | POA: Diagnosis not present

## 2022-11-01 DIAGNOSIS — R079 Chest pain, unspecified: Secondary | ICD-10-CM | POA: Diagnosis not present

## 2022-11-01 DIAGNOSIS — R296 Repeated falls: Secondary | ICD-10-CM | POA: Diagnosis not present

## 2022-11-01 DIAGNOSIS — R278 Other lack of coordination: Secondary | ICD-10-CM | POA: Diagnosis not present

## 2022-11-01 DIAGNOSIS — I10 Essential (primary) hypertension: Secondary | ICD-10-CM | POA: Diagnosis not present

## 2022-11-01 DIAGNOSIS — S32000D Wedge compression fracture of unspecified lumbar vertebra, subsequent encounter for fracture with routine healing: Secondary | ICD-10-CM | POA: Diagnosis not present

## 2022-11-01 DIAGNOSIS — I509 Heart failure, unspecified: Secondary | ICD-10-CM | POA: Diagnosis not present

## 2022-11-03 DIAGNOSIS — N390 Urinary tract infection, site not specified: Secondary | ICD-10-CM | POA: Diagnosis not present

## 2022-11-04 DIAGNOSIS — I129 Hypertensive chronic kidney disease with stage 1 through stage 4 chronic kidney disease, or unspecified chronic kidney disease: Secondary | ICD-10-CM | POA: Diagnosis not present

## 2022-11-04 DIAGNOSIS — Z9181 History of falling: Secondary | ICD-10-CM | POA: Diagnosis not present

## 2022-11-04 DIAGNOSIS — R051 Acute cough: Secondary | ICD-10-CM | POA: Diagnosis not present

## 2022-11-04 DIAGNOSIS — R278 Other lack of coordination: Secondary | ICD-10-CM | POA: Diagnosis not present

## 2022-11-04 DIAGNOSIS — I110 Hypertensive heart disease with heart failure: Secondary | ICD-10-CM | POA: Diagnosis not present

## 2022-11-04 DIAGNOSIS — E1169 Type 2 diabetes mellitus with other specified complication: Secondary | ICD-10-CM | POA: Diagnosis not present

## 2022-11-04 DIAGNOSIS — R296 Repeated falls: Secondary | ICD-10-CM | POA: Diagnosis not present

## 2022-11-04 DIAGNOSIS — S32000D Wedge compression fracture of unspecified lumbar vertebra, subsequent encounter for fracture with routine healing: Secondary | ICD-10-CM | POA: Diagnosis not present

## 2022-11-04 DIAGNOSIS — M6281 Muscle weakness (generalized): Secondary | ICD-10-CM | POA: Diagnosis not present

## 2022-11-04 DIAGNOSIS — R2689 Other abnormalities of gait and mobility: Secondary | ICD-10-CM | POA: Diagnosis not present

## 2022-11-04 DIAGNOSIS — Z86711 Personal history of pulmonary embolism: Secondary | ICD-10-CM | POA: Diagnosis not present

## 2022-11-04 DIAGNOSIS — E039 Hypothyroidism, unspecified: Secondary | ICD-10-CM | POA: Diagnosis not present

## 2022-11-07 ENCOUNTER — Ambulatory Visit: Payer: Medicare HMO | Admitting: Nurse Practitioner

## 2022-11-07 DIAGNOSIS — E1169 Type 2 diabetes mellitus with other specified complication: Secondary | ICD-10-CM | POA: Diagnosis not present

## 2022-11-07 DIAGNOSIS — M6281 Muscle weakness (generalized): Secondary | ICD-10-CM | POA: Diagnosis not present

## 2022-11-07 DIAGNOSIS — E039 Hypothyroidism, unspecified: Secondary | ICD-10-CM | POA: Diagnosis not present

## 2022-11-07 DIAGNOSIS — I82401 Acute embolism and thrombosis of unspecified deep veins of right lower extremity: Secondary | ICD-10-CM | POA: Diagnosis not present

## 2022-11-07 DIAGNOSIS — I110 Hypertensive heart disease with heart failure: Secondary | ICD-10-CM | POA: Diagnosis not present

## 2022-11-07 DIAGNOSIS — E1165 Type 2 diabetes mellitus with hyperglycemia: Secondary | ICD-10-CM | POA: Diagnosis not present

## 2022-11-07 DIAGNOSIS — S32000A Wedge compression fracture of unspecified lumbar vertebra, initial encounter for closed fracture: Secondary | ICD-10-CM | POA: Diagnosis not present

## 2022-11-07 DIAGNOSIS — R296 Repeated falls: Secondary | ICD-10-CM | POA: Diagnosis not present

## 2022-11-07 DIAGNOSIS — S32000D Wedge compression fracture of unspecified lumbar vertebra, subsequent encounter for fracture with routine healing: Secondary | ICD-10-CM | POA: Diagnosis not present

## 2022-11-08 ENCOUNTER — Encounter: Payer: Self-pay | Admitting: Nurse Practitioner

## 2022-11-09 DIAGNOSIS — Z9181 History of falling: Secondary | ICD-10-CM | POA: Diagnosis not present

## 2022-11-09 DIAGNOSIS — R2689 Other abnormalities of gait and mobility: Secondary | ICD-10-CM | POA: Diagnosis not present

## 2022-11-09 DIAGNOSIS — S32000D Wedge compression fracture of unspecified lumbar vertebra, subsequent encounter for fracture with routine healing: Secondary | ICD-10-CM | POA: Diagnosis not present

## 2022-11-09 DIAGNOSIS — M6281 Muscle weakness (generalized): Secondary | ICD-10-CM | POA: Diagnosis not present

## 2022-11-09 DIAGNOSIS — R278 Other lack of coordination: Secondary | ICD-10-CM | POA: Diagnosis not present

## 2022-11-11 DIAGNOSIS — I82401 Acute embolism and thrombosis of unspecified deep veins of right lower extremity: Secondary | ICD-10-CM | POA: Diagnosis not present

## 2022-11-11 DIAGNOSIS — Z86711 Personal history of pulmonary embolism: Secondary | ICD-10-CM | POA: Diagnosis not present

## 2022-11-11 DIAGNOSIS — M6281 Muscle weakness (generalized): Secondary | ICD-10-CM | POA: Diagnosis not present

## 2022-11-11 DIAGNOSIS — E039 Hypothyroidism, unspecified: Secondary | ICD-10-CM | POA: Diagnosis not present

## 2022-11-11 DIAGNOSIS — S32000A Wedge compression fracture of unspecified lumbar vertebra, initial encounter for closed fracture: Secondary | ICD-10-CM | POA: Diagnosis not present

## 2022-11-11 DIAGNOSIS — S32000D Wedge compression fracture of unspecified lumbar vertebra, subsequent encounter for fracture with routine healing: Secondary | ICD-10-CM | POA: Diagnosis not present

## 2022-11-11 DIAGNOSIS — I110 Hypertensive heart disease with heart failure: Secondary | ICD-10-CM | POA: Diagnosis not present

## 2022-11-11 DIAGNOSIS — E1169 Type 2 diabetes mellitus with other specified complication: Secondary | ICD-10-CM | POA: Diagnosis not present

## 2022-11-11 DIAGNOSIS — E1165 Type 2 diabetes mellitus with hyperglycemia: Secondary | ICD-10-CM | POA: Diagnosis not present

## 2022-11-13 DIAGNOSIS — M6281 Muscle weakness (generalized): Secondary | ICD-10-CM | POA: Diagnosis not present

## 2022-11-13 DIAGNOSIS — S32000D Wedge compression fracture of unspecified lumbar vertebra, subsequent encounter for fracture with routine healing: Secondary | ICD-10-CM | POA: Diagnosis not present

## 2022-11-13 DIAGNOSIS — R278 Other lack of coordination: Secondary | ICD-10-CM | POA: Diagnosis not present

## 2022-11-13 DIAGNOSIS — Z9181 History of falling: Secondary | ICD-10-CM | POA: Diagnosis not present

## 2022-11-13 DIAGNOSIS — R2689 Other abnormalities of gait and mobility: Secondary | ICD-10-CM | POA: Diagnosis not present

## 2022-11-15 DIAGNOSIS — M48061 Spinal stenosis, lumbar region without neurogenic claudication: Secondary | ICD-10-CM | POA: Diagnosis not present

## 2022-11-15 DIAGNOSIS — I110 Hypertensive heart disease with heart failure: Secondary | ICD-10-CM | POA: Diagnosis not present

## 2022-11-15 DIAGNOSIS — E039 Hypothyroidism, unspecified: Secondary | ICD-10-CM | POA: Diagnosis not present

## 2022-11-15 DIAGNOSIS — I2699 Other pulmonary embolism without acute cor pulmonale: Secondary | ICD-10-CM | POA: Diagnosis not present

## 2022-11-15 DIAGNOSIS — K5909 Other constipation: Secondary | ICD-10-CM | POA: Diagnosis not present

## 2022-11-15 DIAGNOSIS — I5032 Chronic diastolic (congestive) heart failure: Secondary | ICD-10-CM | POA: Diagnosis not present

## 2022-11-15 DIAGNOSIS — I82401 Acute embolism and thrombosis of unspecified deep veins of right lower extremity: Secondary | ICD-10-CM | POA: Diagnosis not present

## 2022-11-15 DIAGNOSIS — E1165 Type 2 diabetes mellitus with hyperglycemia: Secondary | ICD-10-CM | POA: Diagnosis not present

## 2022-11-15 DIAGNOSIS — S32020D Wedge compression fracture of second lumbar vertebra, subsequent encounter for fracture with routine healing: Secondary | ICD-10-CM | POA: Diagnosis not present

## 2022-11-17 ENCOUNTER — Telehealth: Payer: Self-pay | Admitting: Nurse Practitioner

## 2022-11-17 NOTE — Telephone Encounter (Signed)
Verbal orders ok

## 2022-11-17 NOTE — Telephone Encounter (Signed)
Home Health verbal orders Caller Name: Agency Name: [facility]  Callback number: [phone]  Requesting OT/PT/Skilled nursing/Social Work/Speech: skilled nursing  Reason: diabetes,teaching of education,medication teaching  Frequency:every other week 8 wks  Please forward to [facility] pool or providers CMA

## 2022-11-18 ENCOUNTER — Inpatient Hospital Stay: Payer: Medicare HMO | Admitting: Nurse Practitioner

## 2022-11-18 NOTE — Telephone Encounter (Signed)
Left voicemail for [facility] to call the office back.

## 2022-11-19 DIAGNOSIS — I82401 Acute embolism and thrombosis of unspecified deep veins of right lower extremity: Secondary | ICD-10-CM | POA: Diagnosis not present

## 2022-11-19 DIAGNOSIS — E039 Hypothyroidism, unspecified: Secondary | ICD-10-CM | POA: Diagnosis not present

## 2022-11-19 DIAGNOSIS — I110 Hypertensive heart disease with heart failure: Secondary | ICD-10-CM | POA: Diagnosis not present

## 2022-11-19 DIAGNOSIS — S32020D Wedge compression fracture of second lumbar vertebra, subsequent encounter for fracture with routine healing: Secondary | ICD-10-CM | POA: Diagnosis not present

## 2022-11-19 DIAGNOSIS — E1165 Type 2 diabetes mellitus with hyperglycemia: Secondary | ICD-10-CM | POA: Diagnosis not present

## 2022-11-19 DIAGNOSIS — K5909 Other constipation: Secondary | ICD-10-CM | POA: Diagnosis not present

## 2022-11-19 DIAGNOSIS — I2699 Other pulmonary embolism without acute cor pulmonale: Secondary | ICD-10-CM | POA: Diagnosis not present

## 2022-11-19 DIAGNOSIS — I5032 Chronic diastolic (congestive) heart failure: Secondary | ICD-10-CM | POA: Diagnosis not present

## 2022-11-19 DIAGNOSIS — M48061 Spinal stenosis, lumbar region without neurogenic claudication: Secondary | ICD-10-CM | POA: Diagnosis not present

## 2022-11-21 ENCOUNTER — Telehealth: Payer: Self-pay | Admitting: Nurse Practitioner

## 2022-11-21 DIAGNOSIS — K5909 Other constipation: Secondary | ICD-10-CM | POA: Diagnosis not present

## 2022-11-21 DIAGNOSIS — S32020D Wedge compression fracture of second lumbar vertebra, subsequent encounter for fracture with routine healing: Secondary | ICD-10-CM | POA: Diagnosis not present

## 2022-11-21 DIAGNOSIS — I5032 Chronic diastolic (congestive) heart failure: Secondary | ICD-10-CM | POA: Diagnosis not present

## 2022-11-21 DIAGNOSIS — I82401 Acute embolism and thrombosis of unspecified deep veins of right lower extremity: Secondary | ICD-10-CM | POA: Diagnosis not present

## 2022-11-21 DIAGNOSIS — I110 Hypertensive heart disease with heart failure: Secondary | ICD-10-CM | POA: Diagnosis not present

## 2022-11-21 DIAGNOSIS — I2699 Other pulmonary embolism without acute cor pulmonale: Secondary | ICD-10-CM | POA: Diagnosis not present

## 2022-11-21 DIAGNOSIS — E1165 Type 2 diabetes mellitus with hyperglycemia: Secondary | ICD-10-CM | POA: Diagnosis not present

## 2022-11-21 DIAGNOSIS — E039 Hypothyroidism, unspecified: Secondary | ICD-10-CM | POA: Diagnosis not present

## 2022-11-21 DIAGNOSIS — M48061 Spinal stenosis, lumbar region without neurogenic claudication: Secondary | ICD-10-CM | POA: Diagnosis not present

## 2022-11-21 NOTE — Telephone Encounter (Signed)
Reached secured voicemail for [facility]. Left detailed message with verbal orders. Asked for call back if any questions.  No further action needed at this time.

## 2022-11-21 NOTE — Telephone Encounter (Signed)
Home Health verbal orders Caller Name: mark  Agency Name: [facility]   Callback number: [phone], secured   Requesting OT/PT/Skilled nursing/Social Work/Speech: PT   Reason:  Frequency: one week one, two week four, one week two, starting 8/23  Please forward to [facility] pool or providers CMA

## 2022-11-21 NOTE — Telephone Encounter (Signed)
Called and spoke with Loraine Leriche.  Verbal orders given.

## 2022-11-21 NOTE — Telephone Encounter (Signed)
Verbal orders ok

## 2022-11-22 ENCOUNTER — Emergency Department: Payer: Medicare HMO

## 2022-11-22 ENCOUNTER — Telehealth: Payer: Self-pay | Admitting: Nurse Practitioner

## 2022-11-22 ENCOUNTER — Observation Stay
Admission: EM | Admit: 2022-11-22 | Discharge: 2022-11-23 | Disposition: A | Discharge status: Home Health | Payer: Medicare HMO | Attending: Internal Medicine | Admitting: Internal Medicine

## 2022-11-22 ENCOUNTER — Other Ambulatory Visit: Payer: Self-pay

## 2022-11-22 DIAGNOSIS — I5032 Chronic diastolic (congestive) heart failure: Secondary | ICD-10-CM | POA: Insufficient documentation

## 2022-11-22 DIAGNOSIS — Z9889 Other specified postprocedural states: Secondary | ICD-10-CM | POA: Diagnosis not present

## 2022-11-22 DIAGNOSIS — Z86718 Personal history of other venous thrombosis and embolism: Secondary | ICD-10-CM | POA: Insufficient documentation

## 2022-11-22 DIAGNOSIS — Z85828 Personal history of other malignant neoplasm of skin: Secondary | ICD-10-CM | POA: Insufficient documentation

## 2022-11-22 DIAGNOSIS — K922 Gastrointestinal hemorrhage, unspecified: Secondary | ICD-10-CM | POA: Insufficient documentation

## 2022-11-22 DIAGNOSIS — M48061 Spinal stenosis, lumbar region without neurogenic claudication: Secondary | ICD-10-CM | POA: Diagnosis not present

## 2022-11-22 DIAGNOSIS — I10 Essential (primary) hypertension: Secondary | ICD-10-CM | POA: Diagnosis present

## 2022-11-22 DIAGNOSIS — Z87891 Personal history of nicotine dependence: Secondary | ICD-10-CM | POA: Insufficient documentation

## 2022-11-22 DIAGNOSIS — Z7982 Long term (current) use of aspirin: Secondary | ICD-10-CM | POA: Insufficient documentation

## 2022-11-22 DIAGNOSIS — I5023 Acute on chronic systolic (congestive) heart failure: Secondary | ICD-10-CM | POA: Insufficient documentation

## 2022-11-22 DIAGNOSIS — K625 Hemorrhage of anus and rectum: Principal | ICD-10-CM | POA: Diagnosis present

## 2022-11-22 DIAGNOSIS — E039 Hypothyroidism, unspecified: Secondary | ICD-10-CM | POA: Diagnosis not present

## 2022-11-22 DIAGNOSIS — I5022 Chronic systolic (congestive) heart failure: Secondary | ICD-10-CM | POA: Insufficient documentation

## 2022-11-22 DIAGNOSIS — Z7901 Long term (current) use of anticoagulants: Secondary | ICD-10-CM | POA: Insufficient documentation

## 2022-11-22 DIAGNOSIS — K5791 Diverticulosis of intestine, part unspecified, without perforation or abscess with bleeding: Secondary | ICD-10-CM

## 2022-11-22 DIAGNOSIS — I110 Hypertensive heart disease with heart failure: Secondary | ICD-10-CM | POA: Insufficient documentation

## 2022-11-22 DIAGNOSIS — R58 Hemorrhage, not elsewhere classified: Secondary | ICD-10-CM | POA: Diagnosis not present

## 2022-11-22 DIAGNOSIS — E118 Type 2 diabetes mellitus with unspecified complications: Secondary | ICD-10-CM | POA: Diagnosis present

## 2022-11-22 LAB — CBC WITH DIFFERENTIAL/PLATELET
Abs Immature Granulocytes: 0.04 10*3/uL (ref 0.00–0.07)
Basophils Absolute: 0.1 10*3/uL (ref 0.0–0.1)
Basophils Relative: 1 %
Eosinophils Absolute: 0.2 10*3/uL (ref 0.0–0.5)
Eosinophils Relative: 2 %
HCT: 38.3 % (ref 36.0–46.0)
Hemoglobin: 12.3 g/dL (ref 12.0–15.0)
Immature Granulocytes: 1 %
Lymphocytes Relative: 27 %
Lymphs Abs: 2.1 10*3/uL (ref 0.7–4.0)
MCH: 30.6 pg (ref 26.0–34.0)
MCHC: 32.1 g/dL (ref 30.0–36.0)
MCV: 95.3 fL (ref 80.0–100.0)
Monocytes Absolute: 0.7 10*3/uL (ref 0.1–1.0)
Monocytes Relative: 9 %
Neutro Abs: 4.8 10*3/uL (ref 1.7–7.7)
Neutrophils Relative %: 60 %
Platelets: 200 10*3/uL (ref 150–400)
RBC: 4.02 MIL/uL (ref 3.87–5.11)
RDW: 14.6 % (ref 11.5–15.5)
WBC: 7.9 10*3/uL (ref 4.0–10.5)
nRBC: 0 % (ref 0.0–0.2)

## 2022-11-22 LAB — TYPE AND SCREEN
ABO/RH(D): B POS
Antibody Screen: NEGATIVE

## 2022-11-22 LAB — BASIC METABOLIC PANEL
Anion gap: 5 (ref 5–15)
BUN: 18 mg/dL (ref 8–23)
CO2: 30 mmol/L (ref 22–32)
Calcium: 8.3 mg/dL — ABNORMAL LOW (ref 8.9–10.3)
Chloride: 104 mmol/L (ref 98–111)
Creatinine, Ser: 0.85 mg/dL (ref 0.44–1.00)
GFR, Estimated: >60 mL/min (ref >60)
Glucose, Bld: 99 mg/dL (ref 70–99)
Potassium: 3.9 mmol/L (ref 3.5–5.1)
Sodium: 139 mmol/L (ref 135–145)

## 2022-11-22 LAB — GLUCOSE, CAPILLARY
Glucose-Capillary: 81 mg/dL (ref 70–99)
Glucose-Capillary: 98 mg/dL (ref 70–99)

## 2022-11-22 LAB — HEMOGLOBIN AND HEMATOCRIT, BLOOD
HCT: 39.5 % (ref 36.0–46.0)
HCT: 39.9 % (ref 36.0–46.0)
Hemoglobin: 13 g/dL (ref 12.0–15.0)
Hemoglobin: 12.9 g/dL (ref 12.0–15.0)

## 2022-11-22 MED ORDER — ACETAMINOPHEN 500 MG PO TABS
1000.0000 mg | ORAL_TABLET | Freq: Four times a day (QID) | ORAL | Status: DC | PRN
  Administered 2022-11-23: 1000 mg via ORAL
  Filled 2022-11-22: qty 2

## 2022-11-22 MED ORDER — PANTOPRAZOLE SODIUM 40 MG IV SOLR
40.0000 mg | Freq: Two times a day (BID) | INTRAVENOUS | Status: DC
  Administered 2022-11-22 – 2022-11-23 (×2): 40 mg via INTRAVENOUS
  Filled 2022-11-22 (×2): qty 10

## 2022-11-22 MED ORDER — DOCUSATE SODIUM 100 MG PO CAPS
100.0000 mg | ORAL_CAPSULE | Freq: Every day | ORAL | Status: DC
  Administered 2022-11-22: 100 mg via ORAL
  Filled 2022-11-22: qty 1

## 2022-11-22 MED ORDER — LEVOTHYROXINE SODIUM 100 MCG PO TABS
100.0000 ug | ORAL_TABLET | Freq: Every day | ORAL | Status: DC
  Administered 2022-11-23: 100 ug via ORAL
  Filled 2022-11-22: qty 1

## 2022-11-22 MED ORDER — LOSARTAN POTASSIUM 50 MG PO TABS: 25.0000 mg | ORAL_TABLET | Freq: Every day | ORAL | Status: DC

## 2022-11-22 MED ORDER — PREDNISONE 10 MG PO TABS
10.0000 mg | ORAL_TABLET | Freq: Every day | ORAL | Status: DC
  Administered 2022-11-23: 10 mg via ORAL
  Filled 2022-11-22: qty 1

## 2022-11-22 MED ORDER — HYDROCODONE-ACETAMINOPHEN 5-325 MG PO TABS
1.0000 | ORAL_TABLET | ORAL | Status: DC | PRN
  Administered 2022-11-22 – 2022-11-23 (×2): 1 via ORAL
  Filled 2022-11-22 (×2): qty 1

## 2022-11-22 MED ORDER — IOHEXOL 300 MG/ML  SOLN
100.0000 mL | Freq: Once | INTRAMUSCULAR | Status: AC | PRN
  Administered 2022-11-22: 100 mL via INTRAVENOUS

## 2022-11-22 MED ORDER — HYDRALAZINE HCL 20 MG/ML IJ SOLN: 5.0000 mg | Freq: Four times a day (QID) | INTRAMUSCULAR | Status: DC | PRN

## 2022-11-22 MED ORDER — LORATADINE 10 MG PO TABS
10.0000 mg | ORAL_TABLET | Freq: Every day | ORAL | Status: DC
  Administered 2022-11-23: 10 mg via ORAL
  Filled 2022-11-22: qty 1

## 2022-11-22 MED ORDER — FUROSEMIDE 10 MG/ML IJ SOLN
40.0000 mg | Freq: Two times a day (BID) | INTRAMUSCULAR | Status: DC
  Administered 2022-11-22 – 2022-11-23 (×2): 40 mg via INTRAVENOUS
  Filled 2022-11-22 (×2): qty 4

## 2022-11-22 MED ORDER — PANTOPRAZOLE SODIUM 40 MG IV SOLR
40.0000 mg | Freq: Once | INTRAVENOUS | Status: AC
  Administered 2022-11-22: 40 mg via INTRAVENOUS
  Filled 2022-11-22: qty 10

## 2022-11-22 MED ORDER — BACLOFEN 10 MG PO TABS
10.0000 mg | ORAL_TABLET | Freq: Every day | ORAL | Status: DC | PRN
  Administered 2022-11-23: 10 mg via ORAL
  Filled 2022-11-22: qty 1

## 2022-11-22 MED ORDER — INSULIN ASPART 100 UNIT/ML IJ SOLN
0.0000 [IU] | Freq: Three times a day (TID) | INTRAMUSCULAR | Status: DC
  Administered 2022-11-23: 3 [IU] via SUBCUTANEOUS
  Filled 2022-11-22: qty 1

## 2022-11-22 NOTE — Telephone Encounter (Signed)
FYI: This call has been transferred to Access Nurse. Once the result note has been entered staff can address the message at that time.  Patient called in with the following symptoms:  Red Word: blood in urine, thick like cranberry sauce   Please advise at [facility] [phone]  Message is routed to Provider Pool and [facility] Triage
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
## 2022-11-22 NOTE — H&P (Signed)
History and Physical    Stacy Kirk BJY:[id number] DOB: 12/12/1945 DOA: 11/22/2022  PCP: Eden Emms, NP (Confirm with patient/family/NH records and if not entered, this has to be entered at [facility] point of entry) Patient coming from: Home  I have personally briefly reviewed patient's old medical records in [facility]  Chief Complaint: Rectal bleed  HPI: Stacy Kirk is a 77 y.o. female with medical history significant of recently diagnosed DVT and PE, chronic HFrEF with LVEF 40-45%, HTN, diverticulosis, GERD, osteoporosis with multiple always on chronic steroid therapy, hypothyroidism, presented with new onset of rectal bleeding.  Symptoms started last night, patient had a small loose bowel movement " cranberry sauce like" denies any abdominal pain and no event overnight.  This morning, patient woke up and passed relatively large amount of bright red-colored loose bowel movement.  Denies any chest pain lightheadedness or shortness of breath.  Last month, patient was admitted for fall, when incidentally it was found patient has DVT and PE and patient was discharged on Eliquis.  History of diverticulosis but no history of GI bleed before.  ED Course: Borderline bradycardia, blood pressure 138/98, hemoglobin 12.3, BUN 18, creatinine 0.8.  ED offered CTA abdomen pelvis, patient declined.  Review of Systems: As per HPI otherwise 14 point review of systems negative.    Past Medical History:  Diagnosis Date   Arthritis    Cancer (HCC)    ovarian, skin face- skin cancer   Complication of anesthesia    slow to awaken everday, even slower with anesthesia   Constipation    Diabetes mellitus    Type II   GERD (gastroesophageal reflux disease)    Headache 05/12/2015   "after a fall" headache for 8 months   Hemorrhoids    Hernia    History of kidney stones    passed   Hypertension    Hypothyroidism    Loss of appetite    Muscle spasm    back   Osteoporosis     Redness    abd wound   Urine frequency    unable to hold urine    Wears hearing aid    bilateral     Past Surgical History:  Procedure Laterality Date   ABDOMINAL HYSTERECTOMY     APPENDECTOMY     CHOLECYSTECTOMY     COLONOSCOPY W/ POLYPECTOMY     HERNIA REPAIR  Jun2012   open component separation w biologic mesh (Dr. Luisa Hart)   TOE SURGERY Left    2nd toe   TONSILLECTOMY       reports that she quit smoking about 24 years ago. Her smoking use included cigarettes. She started smoking about 39 years ago. She has never used smokeless tobacco. She reports that she does not drink alcohol and does not use drugs.  Allergies  Allergen Reactions   Erythromycin Diarrhea and Other (See Comments)    "Tears" her stomach up. Major digestive upset.   Tramadol Shortness Of Breath and Other (See Comments)    Felt like lungs filled up; unable to lay down   Albumin (Human) Other (See Comments)    Pt unsure of reaction   Aspirin Itching and Other (See Comments)    Caused lethargy, also   Codeine Itching and Other (See Comments)    Cause lethargy, also   Cortisone Other (See Comments)    NO STEROID INJECTIONS; headaches   Linzess [Linaclotide] Diarrhea   Propoxyphene Other (See Comments)    Terrible reaction;  headaches   Simvastatin Itching and Other (See Comments)    Severe muscle aches   Triamcinolone Acetonide Other (See Comments)    headache    Family History  Problem Relation Age of Onset   Diabetes Mother    Early death Sister    Drug abuse Sister      Prior to Admission medications   Medication Sig Start Date End Date Taking? Authorizing Provider  acetaminophen (TYLENOL) 500 MG tablet Take 1,000 mg by mouth every 6 (six) hours as needed (FOR PAIN.).    [provider]  apixaban (ELIQUIS) 5 MG TABS tablet Take 2 tablets (10 mg total) by mouth 2 (two) times daily for 2 days, THEN 1 tablet (5 mg total) 2 (two) times daily. Patient taking differently: Take 5 mg by  mouth two times a day 10/10/22 11/11/22  Jerald Kief, MD  baclofen (LIORESAL) 10 MG tablet Take 10 mg by mouth daily as needed for muscle spasms.    [provider]  baclofen (LIORESAL) 20 MG tablet Take 0.5 tablets (10 mg total) by mouth daily as needed for muscle spasms. Patient not taking: Reported on 10/23/2022 08/05/22   Eden Emms, NP  cetirizine (ZYRTEC) 10 MG tablet Take 10 mg by mouth daily. 10/25/22   [provider]  diltiazem (CARDIZEM CD) 120 MG 24 hr capsule Take 120 mg by mouth daily. 07/23/22   [provider]  diltiazem (DILACOR XR) 120 MG 24 hr capsule Take 120 mg by mouth daily. 10/25/22   [provider]  docusate sodium (COLACE) 100 MG capsule Take 100 mg by mouth at bedtime. 10/25/22   [provider]  furosemide (LASIX) 20 MG tablet Take 20 mg by mouth daily. 11/02/22   [provider]  HYDROcodone-acetaminophen (NORCO/VICODIN) 5-325 MG tablet Take 1-2 tablets by mouth every 4 (four) hours as needed for moderate pain. Patient taking differently: Take 1 tablet by mouth every 4 (four) hours as needed (for pain). 10/10/22   Jerald Kief, MD  Hydrocortisone (CORTIZONE-10 EX) Apply 1 application topically daily as needed (for itchy skin).    [provider]  ibuprofen (ADVIL) 400 MG tablet Take 400 mg by mouth every 6 (six) hours as needed for mild pain or headache.    [provider]  ICY HOT LIDOCAINE PLUS MENTHOL 4-1 % PTCH SMARTSIG:1 Patch(s) Topical 10/25/22   [provider]  levothyroxine (SYNTHROID, LEVOTHROID) 100 MCG tablet Take 100 mcg by mouth daily before breakfast.  08/22/10   [provider]  lidocaine 4 % SMARTSIG:Topical 10/25/22   [provider]  linaclotide (LINZESS) 72 MCG capsule Take 1 capsule (72 mcg total) by mouth daily before breakfast. Patient not taking: Reported on 10/23/2022 08/05/22   Eden Emms, NP  losartan (COZAAR) 25 MG tablet Take 1 tablet (25 mg  total) by mouth daily. 10/11/22 11/10/22  Jerald Kief, MD  magnesium gluconate (MAGONATE) 500 MG tablet Take 500 mg by mouth daily. 10/25/22   [provider]  Menthol, Topical Analgesic, (BENGAY EX) Apply 1 application topically 4 (four) times daily as needed (for back pain).    [provider]  metFORMIN (GLUCOPHAGE) 500 MG tablet Take 1 tablet (500 mg total) by mouth daily after supper. Patient taking differently: Take 500 mg by mouth every evening. 08/05/22   Eden Emms, NP  Multiple Vitamin (MULTIVITAMIN WITH MINERALS) TABS tablet Take 1 tablet by mouth daily. ESSENTIAL ONE SUPPLEMENT    [provider]  polyethylene glycol (MIRALAX / GLYCOLAX) 17 g packet Take 17 g by mouth 2 (two) times daily. Patient not taking: Reported on 10/23/2022 10/10/22   Jerald Kief, MD  predniSONE (DELTASONE) 10 MG tablet Take 1 tablet (10 mg total) by mouth daily with breakfast. 08/05/22   Eden Emms, NP  senna-docusate (SENOKOT-S) 8.6-50 MG tablet Take 2 tablets by mouth 2 (two) times daily. Patient not taking: Reported on 10/23/2022 10/10/22   Jerald Kief, MD    Physical Exam: Vitals:   11/22/22 1111 11/22/22 1121 11/22/22 1122  BP:  (!) 138/98   Pulse:  (!) 54   Resp:  20   Temp:  98.1 F (36.7 C)   TempSrc:  Oral   SpO2: 96% 94%   Height:   5\' 1"  (1.549 m)    Constitutional: NAD, calm, comfortable Vitals:   11/22/22 1111 11/22/22 1121 11/22/22 1122  BP:  (!) 138/98   Pulse:  (!) 54   Resp:  20   Temp:  98.1 F (36.7 C)   TempSrc:  Oral   SpO2: 96% 94%   Height:   5\' 1"  (1.549 m)   Eyes: PERRL, lids and conjunctivae normal ENMT: Mucous membranes are moist. Posterior pharynx clear of any exudate or lesions.Normal dentition.  Neck: normal, supple, no masses, no thyromegaly Respiratory: clear to auscultation bilaterally, no wheezing, no crackles. Normal respiratory effort. No accessory muscle use.  Cardiovascular: Regular rate and rhythm, no murmurs /  rubs / gallops. No extremity edema. 2+ pedal pulses. No carotid bruits.  Abdomen: no tenderness, no masses palpated. No hepatosplenomegaly. Bowel sounds positive.  Musculoskeletal: no clubbing / cyanosis. No joint deformity upper and lower extremities. Good ROM, no contractures. Normal muscle tone.  Skin: no rashes, lesions, ulcers. No induration Neurologic: CN 2-12 grossly intact. Sensation intact, DTR normal. Strength 5/5 in all 4.  Psychiatric: Normal judgment and insight. Alert and oriented x 3. Normal mood.     Labs on Admission: I have personally reviewed following labs and imaging studies  CBC: Recent Labs  Lab 11/22/22 1127  WBC 7.9  NEUTROABS 4.8  HGB 12.3  HCT 38.3  MCV 95.3  PLT 200   Basic Metabolic Panel: Recent Labs  Lab 11/22/22 1127  NA 139  K 3.9  CL 104  CO2 30  GLUCOSE 99  BUN 18  CREATININE 0.85  CALCIUM 8.3*   GFR: CrCl cannot be calculated (Unknown ideal weight.). Liver Function Tests: No results for input(s): "AST", "ALT", "ALKPHOS", "BILITOT", "PROT", "ALBUMIN" in the last 168 hours. No results for input(s): "LIPASE", "AMYLASE" in the last 168 hours. No results for input(s): "AMMONIA" in the last 168 hours. Coagulation Profile: No results for input(s): "INR", "PROTIME" in the last 168 hours. Cardiac Enzymes: No results for input(s): "CKTOTAL", "CKMB", "CKMBINDEX", "TROPONINI" in the last 168 hours. BNP (last 3 results) No results for input(s): "PROBNP" in the last 8760 hours. HbA1C: No results for input(s): "HGBA1C" in the last 72 hours. CBG: No results for input(s): "GLUCAP" in the last 168 hours. Lipid Profile: No results for input(s): "CHOL", "HDL", "LDLCALC", "TRIG", "CHOLHDL", "LDLDIRECT" in the last 72 hours. Thyroid Function Tests: No results for input(s): "TSH", "T4TOTAL", "FREET4", "T3FREE", "THYROIDAB" in the last 72 hours. Anemia Panel: No results for input(s): "VITAMINB12", "FOLATE", "FERRITIN", "TIBC", "IRON", "RETICCTPCT"  in the last 72 hours. Urine analysis:    Component Value Date/Time   COLORURINE YELLOW 10/22/2022 2106   APPEARANCEUR HAZY (A) 10/22/2022 2106   LABSPEC 1.026  10/22/2022 2106   PHURINE 5.0 10/22/2022 2106   GLUCOSEU NEGATIVE 10/22/2022 2106   HGBUR NEGATIVE 10/22/2022 2106   BILIRUBINUR NEGATIVE 10/22/2022 2106   KETONESUR NEGATIVE 10/22/2022 2106   PROTEINUR NEGATIVE 10/22/2022 2106   NITRITE NEGATIVE 10/22/2022 2106   LEUKOCYTESUR NEGATIVE 10/22/2022 2106    Radiological Exams on Admission: No results found.  EKG: Ordered  Assessment/Plan Principal Problem:   GI bleed Active Problems:   Lower GI bleed   Acute on chronic systolic CHF (congestive heart failure), NYHA class 1 (HCC)  (please populate well all problems here in Problem List. (For example, if patient is on BP meds at home and you resume or decide to hold them, it is a problem that needs to be her. Same for CAD, COPD, HLD and so on)  Acute lower GI bleed -Clinically suspect diverticulosis bleeding -Currently, there is no symptoms or signs of hemodynamic instability as patient does not have tachycardia or hypotension, Hb=12 compared to her baseline 14-15 as of last month.  Patient declined CTA abdomen pelvis, will Check series of H/H this evening and secure text GI Dr. Servando Snare. -Last dose of Eliquis was last night of more than 24 hours ago, no indication for reversal of Eliquis -Type and cross -No signs or signs of upper GI bleed, BUN/creatinine ratio within normal limits arguing against upper GI bleed however patient does have risk factors as she is on chronic steroid therapy for multiple OA and she also takes NSAIDs occasionally.  Continue PPI IV for now -NPO  Acute on chronic HFrEF decompensation -Significant fluid overload, probably secondary to uncontrolled hypertension and continuing to take Cardizem, less likely to related to acute GI bleed event -Discontinue Cardizem -Start IV Lasix while monitoring blood  pressure and hemoglobin trending  DVT and PE -Eliquis on hold for GI bleed.  IIDM -SSI  Hypothyroidism -Continue Synthroid  Multiple OA with chronic steroid use -Continue prednisone 10 mg daily  DVT prophylaxis: None (Recent DVT and acute GI bleed) make chemical and mechanical DVT prophylaxis contraindicated Code Status: Full code Family Communication: Husband over phone Disposition Plan: Patient is sick with acute lower GI bleed while on Eliquis, requiring close monitoring H&H and inpatient GI consultation, expect more than 2 midnight hospital stay Consults called: Dr. Servando Snare GI Admission status: Tele admit   Emeline General MD [facility] Pager [phone]  11/22/2022, 1:31 PM

## 2022-11-22 NOTE — ED Triage Notes (Signed)
Pt arrives via [facility] with c/o vaginal bleed. Husband states that while he was assisting pt to the restroom he noted dark red "cranberry juice" blood running down the pt's leg. Pt denies pain/discomfort. Pt A%O x4, no obvious distress noted. Pt was recently put on Eliquis for DVTs.

## 2022-11-22 NOTE — Telephone Encounter (Signed)
Per access nurse note pts husband agreed for pt to go to ED for eval and testing. Sending note to Audria Nine NP.

## 2022-11-22 NOTE — ED Provider Notes (Addendum)
[facility] Provider Note    Event Date/Time   First MD Initiated Contact with Patient 11/22/22 1106     (approximate)   History   Vaginal Bleeding   HPI  Stacy Kirk is a 77 y.o. female recently started on Eliquis PE present to the ER for evaluation of heavy bleeding.  Reportedly thought to be vaginal patient is however status post hysterectomy.  Does have a history of colonoscopy requiring polypectomy.  Patient is a poor historian.  She denies any pain.  No falls.  No flank pain or dysuria.     Physical Exam   Triage Vital Signs: ED Triage Vitals  Encounter Vitals Group     BP 11/22/22 1121 (!) 138/98     Systolic BP Percentile --      Diastolic BP Percentile --      Pulse Rate 11/22/22 1121 (!) 54     Resp 11/22/22 1121 20     Temp 11/22/22 1121 98.1 F (36.7 C)     Temp Source 11/22/22 1121 Oral     SpO2 11/22/22 1111 96 %     Weight --      Height 11/22/22 1122 5\' 1"  (1.549 m)     Head Circumference --      Peak Flow --      Pain Score 11/22/22 1122 0     Pain Loc --      Pain Education --      Exclude from Growth Chart --     Most recent vital signs: Vitals:   11/22/22 1111 11/22/22 1121  BP:  (!) 138/98  Pulse:  (!) 54  Resp:  20  Temp:  98.1 F (36.7 C)  SpO2: 96% 94%     Constitutional: Alert  Eyes: Conjunctivae are normal.  Head: Atraumatic. Nose: No congestion/rhinnorhea. Mouth/Throat: Mucous membranes are moist.   Neck: Painless ROM.  Cardiovascular:   Good peripheral circulation. Respiratory: Normal respiratory effort.  No retractions.  Gastrointestinal: Soft and nontender.  Bright red blood per rectum.  No evidence of active bleeding hemorrhoid. Musculoskeletal:  no deformity Neurologic:  MAE spontaneously. No gross focal neurologic deficits are appreciated.  Skin:  Skin is warm, dry and intact. No rash noted. Psychiatric: Mood and affect are normal. Speech and behavior are normal.    ED Results /  Procedures / Treatments   Labs (all labs ordered are listed, but only abnormal results are displayed) Labs Reviewed  BASIC METABOLIC PANEL - Abnormal; Notable for the following components:      Result Value   Calcium 8.3 (*)    All other components within normal limits  CBC WITH DIFFERENTIAL/PLATELET  URINALYSIS, ROUTINE W REFLEX MICROSCOPIC  HEMOGLOBIN AND HEMATOCRIT, BLOOD  HEMOGLOBIN AND HEMATOCRIT, BLOOD  TYPE AND SCREEN     EKG     RADIOLOGY   PROCEDURES:  Critical Care performed:   Procedures   MEDICATIONS ORDERED IN ED: Medications  acetaminophen (TYLENOL) tablet 1,000 mg (has no administration in time range)  HYDROcodone-acetaminophen (NORCO/VICODIN) 5-325 MG per tablet 1 tablet (has no administration in time range)  levothyroxine (SYNTHROID) tablet 100 mcg (has no administration in time range)  predniSONE (DELTASONE) tablet 10 mg (has no administration in time range)  docusate sodium (COLACE) capsule 100 mg (has no administration in time range)  baclofen (LIORESAL) tablet 10 mg (has no administration in time range)  loratadine (CLARITIN) tablet 10 mg (has no administration in time range)  furosemide (LASIX) injection 40  mg (has no administration in time range)  hydrALAZINE (APRESOLINE) injection 5 mg (has no administration in time range)  pantoprazole (PROTONIX) injection 40 mg (has no administration in time range)  insulin aspart (novoLOG) injection 0-15 Units (has no administration in time range)  iohexol (OMNIPAQUE) 300 MG/ML solution 100 mL (100 mLs Intravenous Contrast Given 11/22/22 1221)  pantoprazole (PROTONIX) injection 40 mg (40 mg Intravenous Given 11/22/22 1245)     IMPRESSION / MDM / ASSESSMENT AND PLAN / ED COURSE  I reviewed the triage vital signs and the nursing notes.                              Differential diagnosis includes, but is not limited to, UGIB, LGIB, mass, hemorrhoid, fissure, UTI  Patient presenting to the ER for  evaluation of symptoms as described above.  Based on symptoms, risk factors and considered above differential, this presenting complaint could reflect a potentially life-threatening illness therefore the patient will be placed on continuous pulse oximetry and telemetry for monitoring.  Laboratory evaluation will be sent to evaluate for the above complaints.  Patient her blood work is stable.  She is hemodynamically stable.  I recommended CT imaging given her history of colon polyps to evaluate for mass as well as diverticular burden.  Patient uncooperative and refusing CT imaging.  As she is hemodynamically stable will defer at this time.  Will give dose of Protonix but I have higher suspicion this is lower GI source that she is on Eliquis will consult hospitalist for observation for serial hemoglobins.       FINAL CLINICAL IMPRESSION(S) / ED DIAGNOSES   Final diagnoses:  BRBPR (bright red blood per rectum)     Rx / DC Orders   ED Discharge Orders     None        Note:  This document was prepared using Dragon voice recognition software and may include unintentional dictation errors.    Willy Eddy, MD 11/22/22 1229    Willy Eddy, MD 11/22/22 [id number]

## 2022-11-22 NOTE — Plan of Care (Signed)

## 2022-11-22 NOTE — Progress Notes (Signed)
H/H trending 12.3>13.0, no symptoms of further lower GI bleeding, next H/H 22:00.

## 2022-11-22 NOTE — Telephone Encounter (Signed)
Noted. I see that the patient is being evaluated in the ED setting

## 2022-11-22 NOTE — Telephone Encounter (Signed)
Home Health verbal orders Caller Name: Domingo Pulse, [place] Agency Name: [facility]  Callback number: [phone]  Requesting OT/PT/Skilled nursing/Social Work/Speech: OT   Reason:  To Increase independence and safety in the home   Frequency: 1wk 8   Please forward to [facility] pool or providers CMA

## 2022-11-23 DIAGNOSIS — Z7401 Bed confinement status: Secondary | ICD-10-CM | POA: Diagnosis not present

## 2022-11-23 DIAGNOSIS — R6889 Other general symptoms and signs: Secondary | ICD-10-CM | POA: Diagnosis not present

## 2022-11-23 DIAGNOSIS — K625 Hemorrhage of anus and rectum: Secondary | ICD-10-CM | POA: Diagnosis not present

## 2022-11-23 LAB — GLUCOSE, CAPILLARY
Glucose-Capillary: 96 mg/dL (ref 70–99)
Glucose-Capillary: 179 mg/dL — ABNORMAL HIGH (ref 70–99)

## 2022-11-23 LAB — CBC
HCT: 37.3 % (ref 36.0–46.0)
Hemoglobin: 12.2 g/dL (ref 12.0–15.0)
MCH: 31.1 pg (ref 26.0–34.0)
MCHC: 32.7 g/dL (ref 30.0–36.0)
MCV: 95.2 fL (ref 80.0–100.0)
Platelets: 201 10*3/uL (ref 150–400)
RBC: 3.92 MIL/uL (ref 3.87–5.11)
RDW: 14.6 % (ref 11.5–15.5)
WBC: 7.5 10*3/uL (ref 4.0–10.5)
nRBC: 0 % (ref 0.0–0.2)

## 2022-11-23 LAB — BASIC METABOLIC PANEL
Anion gap: 10 (ref 5–15)
BUN: 19 mg/dL (ref 8–23)
CO2: 30 mmol/L (ref 22–32)
Calcium: 8.3 mg/dL — ABNORMAL LOW (ref 8.9–10.3)
Chloride: 101 mmol/L (ref 98–111)
Creatinine, Ser: 0.86 mg/dL (ref 0.44–1.00)
GFR, Estimated: >60 mL/min (ref >60)
Glucose, Bld: 105 mg/dL — ABNORMAL HIGH (ref 70–99)
Potassium: 3.3 mmol/L — ABNORMAL LOW (ref 3.5–5.1)
Sodium: 141 mmol/L (ref 135–145)

## 2022-11-23 LAB — TSH: TSH: 5.118 u[IU]/mL — ABNORMAL HIGH (ref 0.350–4.500)

## 2022-11-23 MED ORDER — FUROSEMIDE 20 MG PO TABS: 20.0000 mg | ORAL_TABLET | Freq: Every day | ORAL | Status: DC

## 2022-11-23 MED ORDER — POTASSIUM CHLORIDE CRYS ER 20 MEQ PO TBCR
40.0000 meq | EXTENDED_RELEASE_TABLET | Freq: Once | ORAL | Status: AC
  Administered 2022-11-23: 40 meq via ORAL
  Filled 2022-11-23: qty 2

## 2022-11-23 MED ORDER — PANTOPRAZOLE SODIUM 40 MG PO TBEC: 40.0000 mg | DELAYED_RELEASE_TABLET | Freq: Every day | ORAL | 1 refills | Status: DC

## 2022-11-23 NOTE — Plan of Care (Signed)
  Problem: Education: Goal: Ability to describe self-care measures that may prevent or decrease complications (Diabetes Survival Skills Education) will improve Outcome: Progressing Goal: Individualized Educational Video(s) Outcome: Progressing   Problem: Fluid Volume: Goal: Ability to maintain a balanced intake and output will improve Outcome: Progressing   Problem: Health Behavior/Discharge Planning: Goal: Ability to identify and utilize available resources and services will improve Outcome: Progressing Goal: Ability to manage health-related needs will improve Outcome: Progressing   Problem: Metabolic: Goal: Ability to maintain appropriate glucose levels will improve Outcome: Progressing   Problem: Nutritional: Goal: Maintenance of adequate nutrition will improve Outcome: Progressing Goal: Progress toward achieving an optimal weight will improve Outcome: Progressing   Problem: Skin Integrity: Goal: Risk for impaired skin integrity will decrease Outcome: Progressing   Problem: Tissue Perfusion: Goal: Adequacy of tissue perfusion will improve Outcome: Progressing   Problem: Education: Goal: Knowledge of General Education information will improve Description: Including pain rating scale, medication(s)/side effects and non-pharmacologic comfort measures Outcome: Progressing   Problem: Health Behavior/Discharge Planning: Goal: Ability to manage health-related needs will improve Outcome: Progressing   Problem: Clinical Measurements: Goal: Ability to maintain clinical measurements within normal limits will improve Outcome: Progressing Goal: Will remain free from infection Outcome: Progressing Goal: Diagnostic test results will improve Outcome: Progressing Goal: Respiratory complications will improve Outcome: Progressing Goal: Cardiovascular complication will be avoided Outcome: Progressing   Problem: Activity: Goal: Risk for activity intolerance will  decrease Outcome: Progressing

## 2022-11-23 NOTE — Care Management Obs Status (Signed)
MEDICARE OBSERVATION STATUS NOTIFICATION   Patient Details  Name: Stacy Kirk MRN: [id number] Date of Birth: [birth year]   Medicare Observation Status Notification Given:  Yes    Margarito Liner, LCSW 11/23/2022, 1:44 PM

## 2022-11-23 NOTE — Telephone Encounter (Signed)
Verbal orders approved.

## 2022-11-23 NOTE — Telephone Encounter (Signed)
Contacted Home health. Made aware of verbal orders approved. No questions or concerns.

## 2022-11-23 NOTE — TOC Initial Note (Signed)
Transition of Care [facility]) - Initial/Assessment Note    Patient Details  Name: Stacy Kirk MRN: [id number] Date of Birth: 04-Jan-1946  [facility] CM/SW Contact:    Margarito Liner, LCSW Phone Number: 11/23/2022, 9:51 AM  Clinical Narrative:  Per chart review, patient is active with [facility]. Liaison confirmed she is currently receiving PT, OT, RN. PCP documentation is in chart with approval to add ST and SW. [facility] liaison confirmed they can add this as well.                Expected Discharge Plan: Home w Home Health Services Barriers to Discharge: Continued Medical Work up   Patient Goals and CMS Choice            Expected Discharge Plan and Services       Living arrangements for the past 2 months: Single Family Home                           HH Arranged: RN, PT, OT, Speech Therapy, Social Work HH Agency: [facility] Date [facility] Agency Contacted: 11/23/22   Representative spoke with at [facility] Agency: Gearldine Bienenstock  Prior Living Arrangements/Services Living arrangements for the past 2 months: Single Family Home Lives with:: Spouse Patient language and need for interpreter reviewed:: Yes        Need for Family Participation in Patient Care: Yes (Comment) Care giver support system in place?: Yes (comment) Current home services: Home OT, Home PT, Home RN Criminal Activity/Legal Involvement Pertinent to Current Situation/Hospitalization: No - Comment as needed  Activities of Daily Living Home Assistive Devices/Equipment: Walker (specify type) ADL Screening (condition at time of admission) Patient's cognitive ability adequate to safely complete daily activities?: Yes Is the patient deaf or have difficulty hearing?: No Does the patient have difficulty seeing, even when wearing glasses/contacts?: No Does the patient have difficulty concentrating, remembering, or making decisions?: No Patient able to express need for assistance with  ADLs?: Yes Does the patient have difficulty dressing or bathing?: No Independently performs ADLs?: No Communication: Independent Dressing (OT): Independent Grooming: Independent Feeding: Independent Bathing: Needs assistance Is this a change from baseline?: Change from baseline, expected to last <3 days Toileting: Needs assistance Is this a change from baseline?: Change from baseline, expected to last <3 days In/Out Bed: Needs assistance Is this a change from baseline?: Change from baseline, expected to last >3 days Walks in Home: Needs assistance Is this a change from baseline?: Change from baseline, expected to last >3 days Does the patient have difficulty walking or climbing stairs?: Yes Weakness of Legs: Left Weakness of Arms/Hands: None  Permission Sought/Granted                  Emotional Assessment       Orientation: : Oriented to Self, Oriented to Place, Oriented to  Time, Oriented to Situation Alcohol / Substance Use: Not Applicable Psych Involvement: No (comment)  Admission diagnosis:  GI bleed [K92.2] BRBPR (bright red blood per rectum) [K62.5] Patient Active Problem List   Diagnosis Date Noted   Lower GI bleed 11/22/2022   Acute on chronic systolic CHF (congestive heart failure), NYHA class 1 (HCC) 11/22/2022   GI bleed 11/22/2022   BRBPR (bright red blood per rectum) 11/22/2022   Lumbar compression fracture (HCC) 10/02/2022   Closed compression fracture of L2 lumbar vertebra, initial encounter (HCC) 09/30/2022   Hypokalemia 09/30/2022   Hypertension 09/30/2022  Controlled type 2 diabetes mellitus with complication, without long-term current use of insulin (HCC) 08/05/2022   Chronic constipation 08/05/2022   OAB (overactive bladder) 08/05/2022   Hypothyroidism 08/05/2022   Fatigue 08/05/2022   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-L5 level 09/07/2016   Abdominal pain, RLQ 01/24/2011   Abdominal right lower quadrant swelling 01/24/2011   Low back pain 01/24/2011    Knee pain, right 01/24/2011   PCP:  Eden Emms, NP Pharmacy:   [facility] [phone] - [address], [place] - [address] [place] [address] Phone: [phone] Fax: [phone]     Social Determinants of Health (SDOH) Social History: SDOH Screenings   Food Insecurity: No Food Insecurity (11/22/2022)  Housing: Low Risk  (11/22/2022)  Transportation Needs: No Transportation Needs (11/22/2022)  Utilities: Not At Risk (11/22/2022)  Tobacco Use: Medium Risk (11/22/2022)   SDOH Interventions:     Readmission Risk Interventions     No data to display

## 2022-11-23 NOTE — Evaluation (Addendum)
Physical Therapy Evaluation Patient Details Name: Stacy Kirk MRN: [id number] DOB: [birth year] Today's Date: 11/23/2022  History of Present Illness  77 y/o female presented to ED on 11/22/22 for rectal/vaginal bleeding. Admitted for presumed lower GI bleed but patient refusing scans and other workup. PMH: DM, HTN, osteoporosis, hx of PE on Eliquis  Clinical Impression  Patient admitted with the above. PTA, patient lives with husband and handicap son. Unsure of her mobility prior to admission due to patient being unwilling to participate in history/home setup/PLOF. Requires CGA for mobility with RW and incontinent of urine upon standing and refusing further mobility. Presents with generalized weakness, impaired balance, and decreased activity tolerance. Denies falls but reports husband caught and assisted her to the floor. Unable to obtain HR during mobility due to patient agitation about incontinence. Patient will benefit from skilled PT services during acute stay to address listed deficits. Patient will benefit from ongoing therapy at discharge to maximize functional independence and safety.         If plan is discharge home, recommend the following: A little help with walking and/or transfers;A little help with bathing/dressing/bathroom;Assistance with cooking/housework;Assist for transportation;Help with stairs or ramp for entrance   Can travel by private vehicle        Equipment Recommendations None recommended by PT  Recommendations for Other Services       Functional Status Assessment Patient has had a recent decline in their functional status and demonstrates the ability to make significant improvements in function in a reasonable and predictable amount of time.     Precautions / Restrictions Precautions Precautions: Fall Restrictions Weight Bearing Restrictions: No      Mobility  Bed Mobility Overal bed mobility: Needs Assistance Bed Mobility: Supine to Sit, Sit to  Supine     Supine to sit: Contact guard Sit to supine: Min assist   General bed mobility comments: assist for LE management    Transfers Overall transfer level: Needs assistance Equipment used: Rolling Lily Velasquez (2 wheels) Transfers: Sit to/from Stand Sit to Stand: Contact guard assist           General transfer comment: CGA for safety. Able to take sidesteps at EOB after patient was incontinent of urine. Refusing further mobility    Ambulation/Gait                  Stairs            Wheelchair Mobility     Tilt Bed    Modified Rankin (Stroke Patients Only)       Balance Overall balance assessment: Needs assistance, History of Falls Sitting-balance support: No upper extremity supported, Feet supported Sitting balance-Leahy Scale: Good     Standing balance support: Bilateral upper extremity supported, Reliant on assistive device for balance Standing balance-Leahy Scale: Fair                               Pertinent Vitals/Pain Pain Assessment Pain Assessment: No/denies pain    Home Living Family/patient expects to be discharged to:: Private residence Living Arrangements: Spouse/significant other Available Help at Discharge: Family;Available PRN/intermittently Type of Home: House Home Access: Ramped entrance       Home Layout: One level Home Equipment: Cane - single point;Grab bars - toilet;Grab bars - tub/shower;Rolling Trenten Watchman (2 wheels) Additional Comments: reports that husband cares for son with disabilities at home and cant come    Prior Function Prior Level of Function :  Independent/Modified Independent             Mobility Comments: reports she furniture walks at home typically. Refuses to admit to falls despite reporting husband caught and assisted patient to the floor       Extremity/Trunk Assessment   Upper Extremity Assessment Upper Extremity Assessment: Generalized weakness    Lower Extremity  Assessment Lower Extremity Assessment: Generalized weakness    Cervical / Trunk Assessment Cervical / Trunk Assessment: Kyphotic  Communication   Communication Communication: No apparent difficulties  Cognition Arousal: Alert Behavior During Therapy: Agitated Overall Cognitive Status: Within Functional Limits for tasks assessed                                          General Comments      Exercises     Assessment/Plan    PT Assessment Patient needs continued PT services  PT Problem List Decreased strength;Decreased activity tolerance;Decreased balance;Decreased mobility;Decreased safety awareness;Decreased knowledge of precautions;Cardiopulmonary status limiting activity       PT Treatment Interventions DME instruction;Gait training;Functional mobility training;Therapeutic activities;Therapeutic exercise;Balance training;Patient/family education    PT Goals (Current goals can be found in the Care Plan section)  Acute Rehab PT Goals Patient Stated Goal: to get out of here PT Goal Formulation: With patient Time For Goal Achievement: 12/07/22 Potential to Achieve Goals: Fair    Frequency Min 1X/week     Co-evaluation               AM-PAC PT "6 Clicks" Mobility  Outcome Measure Help needed turning from your back to your side while in a flat bed without using bedrails?: A Little Help needed moving from lying on your back to sitting on the side of a flat bed without using bedrails?: A Little Help needed moving to and from a bed to a chair (including a wheelchair)?: A Little Help needed standing up from a chair using your arms (e.g., wheelchair or bedside chair)?: A Little Help needed to walk in hospital room?: A Little Help needed climbing 3-5 steps with a railing? : A Lot 6 Click Score: 17    End of Session Equipment Utilized During Treatment: Gait belt Activity Tolerance: Patient tolerated treatment well Patient left: in bed;with call  bell/phone within reach;with bed alarm set Nurse Communication: Mobility status PT Visit Diagnosis: Unsteadiness on feet (R26.81);Muscle weakness (generalized) (M62.81)    Time: 0981-1914 PT Time Calculation (min) (ACUTE ONLY): 24 min   Charges:   PT Evaluation $PT Eval Moderate Complexity: 1 Mod PT Treatments $Therapeutic Activity: 8-22 mins PT General Charges $$ ACUTE PT VISIT: 1 Visit         Maylon Peppers, PT, DPT Physical Therapist - [facility]  [facility]   Brinleigh Tew A Zakhia Seres 11/23/2022, 11:32 AM

## 2022-11-23 NOTE — TOC Transition Note (Signed)
Transition of Care [facility]) - CM/SW Discharge Note   Patient Details  Name: Stacy Kirk MRN: [id number] Date of Birth: 03-15-1946  [facility] of Care [facility]) CM/SW Contact:  Margarito Liner, LCSW Phone Number: 11/23/2022, 3:31 PM   Clinical Narrative:  Patient has orders to discharge home today. [facility] liaison is aware. EMS transport has been arranged and she is 2nd on the list. Husband confirmed address on facesheet is correct and he is aware they will likely receive a bill for the ride. No further concerns. CSW signing off.   Final next level of care: Home w Home Health Services Barriers to Discharge: Barriers Resolved   Patient Goals and CMS Choice      Discharge Placement                  Patient to be transferred to facility by: EMS Name of family member notified: [name] Patient and family notified of of transfer: 11/23/22  Discharge Plan and Services Additional resources added to the After Visit Summary for                            [facility] Arranged: RN, PT, OT, Speech Therapy, Social Work [employer] Agency: [facility] Date [facility] Agency Contacted: 11/23/22   Representative spoke with at [facility] Agency: Gearldine Bienenstock  Social Determinants of Health (SDOH) Interventions SDOH Screenings   Food Insecurity: No Food Insecurity (11/22/2022)  Housing: Low Risk  (11/22/2022)  Transportation Needs: No Transportation Needs (11/22/2022)  Utilities: Not At Risk (11/22/2022)  Tobacco Use: Medium Risk (11/22/2022)     Readmission Risk Interventions     No data to display

## 2022-11-23 NOTE — Plan of Care (Signed)

## 2022-11-23 NOTE — Care Management CC44 (Signed)
Condition Code 44 Documentation Completed  Patient Details  Name: SHARILEE LOUPE MRN: [id number] Date of Birth: 09-19-1945   Condition Code 44 given:  Yes Patient signature on Condition Code 44 notice:  Yes Documentation of 2 MD's agreement:  Yes Code 44 added to claim:  Yes    Margarito Liner, LCSW 11/23/2022, 1:44 PM

## 2022-11-23 NOTE — Discharge Instructions (Signed)
Return to the ER if further episodes of bleeding.

## 2022-11-23 NOTE — Progress Notes (Signed)
Mobility Specialist - Progress Note   11/23/22 1142  Mobility  Activity Ambulated with assistance in room;Transferred from bed to chair  Level of Assistance Contact guard assist, steadying assist  Assistive Device Front wheel walker  Distance Ambulated (ft) 6 ft  Activity Response Tolerated well  $Mobility charge 1 Mobility     Pt lying in bed upon arrival, utilizing RA. Pt appears mildly agitated but agreeable to activity. Completed bed mobility with minG. STS with minA and transfers with CGA. Pt transferred to visitor chair after declining use of recliner d/t discomfort. Pt left in chair with alarm set, needs in reach.    Filiberto Pinks Mobility Specialist 11/23/22, 11:44 AM

## 2022-11-23 NOTE — Progress Notes (Signed)
    [name], MD [facility]   [address], [address] [place], [place] [address] Phone: [phone] Fax : [phone]   Subjective: This patient was admitted with a suspected GI bleed.  The patient's hemoglobin has been stable.  The patient did refuse a CT angiography.  The patient has also refused any GI workup.  The nursing staff reports no sign of any further GI bleeding.   Objective: Vital signs in last 24 hours: Vitals:   11/22/22 1122 11/22/22 1605 11/22/22 1942 11/23/22 0752  BP:  135/85 (!) 150/61 (!) 157/119  Pulse:  65 (!) 49 (!) 41  Resp:  18  16  Temp:  98.1 F (36.7 C) 98 F (36.7 C) (!) 97.5 F (36.4 C)  TempSrc:  Oral  Oral  SpO2:  96% 94% 94%  Weight:  81.6 kg    Height: 5\' 1"  (1.549 m) 5' 0.98" (1.549 m)     Weight change:   Intake/Output Summary (Last 24 hours) at 11/23/2022 0932 Last data filed at 11/23/2022 0500 Gross per 24 hour  Intake 40 ml  Output --  Net 40 ml     Exam: General: The patient is resting comfortably in bed  Lab Results: @LABTEST2 @ Micro Results: No results found for this or any previous visit (from the past 240 hour(s)). Studies/Results: No results found. Medications: I have reviewed the patient's current medications. Scheduled Meds:  docusate sodium  100 mg Oral QHS   furosemide  40 mg Intravenous BID   insulin aspart  0-15 Units Subcutaneous TID WC   levothyroxine  100 mcg Oral QAC breakfast   loratadine  10 mg Oral Daily   pantoprazole (PROTONIX) IV  40 mg Intravenous Q12H   predniSONE  10 mg Oral Q breakfast   Continuous Infusions: PRN Meds:.acetaminophen, baclofen, hydrALAZINE, HYDROcodone-acetaminophen   Assessment: Principal Problem:   GI bleed Active Problems:   Lower GI bleed   Acute on chronic systolic CHF (congestive heart failure), NYHA class 1 (HCC)   BRBPR (bright red blood per rectum)    Plan: This patient came in with a presumed lower GI bleed although the patient denies this and has refused a  CT angiography and any GI intervention.  The patient has been made n.p.o. and I am not sure why the hospitalist made the patient n.p.o. but I am not planning to do any procedures on this patient.  Due to the patient having no further bleeding and stable hemoglobin with refusing any GI intervention I will sign off at this time.  Please do not hesitate to call if GI as needed.   LOS: 1 day   Sherlyn Hay 11/23/2022, 8:11 AM Pager [phone] 7am-5pm  Check AMION for 5pm -7am coverage and on weekends

## 2022-11-23 NOTE — Discharge Summary (Signed)
Physician Discharge Summary   Patient: Stacy Kirk MRN: [id number] DOB: 1945/09/26  Admit date:     11/22/2022  Discharge date: 11/23/22  Discharge Physician: Kenitha Glendinning   PCP: Eden Emms, NP   Recommendations at discharge:   Take medications as prescribed Keep scheduled follow-up appointment with primary care provider Return to ER if further bleeding episodes  Discharge Diagnoses: Principal Problem:   BRBPR (bright red blood per rectum) Active Problems:   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-L5 level   Controlled type 2 diabetes mellitus with complication, without long-term current use of insulin (HCC)   Hypertension   Acute on chronic systolic CHF (congestive heart failure), NYHA class 1 (HCC)   Rectal bleed  Resolved Problems:   * No resolved hospital problems. *  Hospital Course:  Stacy Kirk is a 77 y.o. female with medical history significant of recently diagnosed DVT and PE, chronic HFrEF with LVEF 40-45%, HTN, diverticulosis, GERD, osteoporosis with multiple always on chronic steroid therapy, hypothyroidism, presented with new onset of rectal bleeding.   Symptoms started last night, patient had a small loose bowel movement " cranberry sauce like" denies any abdominal pain and no event overnight.  This morning, patient woke up and passed relatively large amount of bright red-colored loose bowel movement.  Denies any chest pain lightheadedness or shortness of breath.  Last month, patient was admitted for fall, when incidentally it was found patient has DVT and PE and patient was discharged on Eliquis.  History of diverticulosis but no history of GI bleed before.   ED Course: Borderline bradycardia, blood pressure 138/98, hemoglobin 12.3, BUN 18, creatinine 0.8.   ED offered CTA abdomen pelvis, patient declined.    Assessment and Plan:  Acute lower GI bleed -Patient presented to the ER for concern of rectal bleeding -Has not had any further episodes -Currently,  there is no symptoms or signs of hemodynamic instability as patient does not have tachycardia or hypotension, Hb=12 compared to her baseline 14-15 as of last month.   Patient declined CTA abdomen pelvis, serial H&H was stable She was seen in consultation by GI Patient to be discharged home since she has refused further workup as well as a procedure.  Chronic diastolic dysfunction CHF Stable    DVT and PE Patient presented for evaluation of rectal bleeding Declined further testing which included CT angiogram of the abdomen and pelvis Also declined GI evaluation Will continue Eliquis since hemoglobin is stable Patient advised to follow-up with her primary care provider for repeat H&H    IIDM Continue metformin    Hypothyroidism -Continue Synthroid    Multiple OA with chronic steroid use -Continue prednisone 10 mg daily         Consultants: Gastroenterology Procedures performed: None Disposition:  Home with home health Diet recommendation:  Discharge Diet Orders (From admission, onward)     Start     Ordered   11/23/22 0000  Diet - low sodium heart healthy        11/23/22 1501   11/23/22 0000  Diet Carb Modified        11/23/22 1501           Carb modified diet DISCHARGE MEDICATION: Allergies as of 11/23/2022       Reactions   Erythromycin Diarrhea, Other (See Comments)   "Tears" her stomach up. Major digestive upset.   Tramadol Shortness Of Breath, Other (See Comments)   Felt like lungs filled up; unable to lay down   Albumin (  human) Other (See Comments)   Pt unsure of reaction   Aspirin Itching, Other (See Comments)   Caused lethargy, also   Codeine Itching, Other (See Comments)   Cause lethargy, also   Cortisone Other (See Comments)   NO STEROID INJECTIONS; headaches   Linzess [linaclotide] Diarrhea   Propoxyphene Other (See Comments)   Terrible reaction; headaches   Simvastatin Itching, Other (See Comments)   Severe muscle aches   Triamcinolone  Acetonide Other (See Comments)   headache        Medication List     STOP taking these medications    baclofen 10 MG tablet Commonly known as: LIORESAL   baclofen 20 MG tablet Commonly known as: LIORESAL   furosemide 20 MG tablet Commonly known as: LASIX   ibuprofen 400 MG tablet Commonly known as: ADVIL   linaclotide 72 MCG capsule Commonly known as: Linzess   magnesium gluconate 500 MG tablet Commonly known as: MAGONATE   multivitamin with minerals Tabs tablet   polyethylene glycol 17 g packet Commonly known as: MIRALAX / GLYCOLAX   senna-docusate 8.6-50 MG tablet Commonly known as: Senokot-S       TAKE these medications    acetaminophen 500 MG tablet Commonly known as: TYLENOL Take 1,000 mg by mouth every 6 (six) hours as needed (FOR PAIN.).   apixaban 5 MG Tabs tablet Commonly known as: ELIQUIS Take 2 tablets (10 mg total) by mouth 2 (two) times daily for 2 days, THEN 1 tablet (5 mg total) 2 (two) times daily. Start taking on: October 10, 2022 What changed: See the new instructions.   BENGAY EX Apply 1 application topically 4 (four) times daily as needed (for back pain).   cetirizine 10 MG tablet Commonly known as: ZYRTEC Take 10 mg by mouth daily.   CORTIZONE-10 EX Apply 1 application topically daily as needed (for itchy skin).   diltiazem 120 MG 24 hr capsule Commonly known as: CARDIZEM CD Take 120 mg by mouth daily.   docusate sodium 100 MG capsule Commonly known as: COLACE Take 100 mg by mouth at bedtime.   HYDROcodone-acetaminophen 5-325 MG tablet Commonly known as: NORCO/VICODIN Take 1-2 tablets by mouth every 4 (four) hours as needed for moderate pain. What changed:  how much to take reasons to take this   Icy Hot Lidocaine Plus Menthol 4-1 % Ptch Generic drug: Lidocaine-Menthol SMARTSIG:1 Patch(s) Topical   levothyroxine 100 MCG tablet Commonly known as: SYNTHROID Take 100 mcg by mouth daily before breakfast.   losartan 25  MG tablet Commonly known as: COZAAR Take 1 tablet (25 mg total) by mouth daily.   metFORMIN 500 MG tablet Commonly known as: GLUCOPHAGE Take 1 tablet (500 mg total) by mouth daily after supper. What changed: when to take this   pantoprazole 40 MG tablet Commonly known as: Protonix Take 1 tablet (40 mg total) by mouth daily.   predniSONE 10 MG tablet Commonly known as: DELTASONE Take 1 tablet (10 mg total) by mouth daily with breakfast.        Follow-up Information     Eden Emms, NP Follow up in 5 day(s).   Specialties: Nurse Practitioner, Family Medicine Why: Needs repeat CBC Contact information: [address] [place] [address] [phone]                Discharge Exam: Ceasar Mons Weights   11/22/22 1605  Weight: 81.6 kg     Eyes: PERRL, lids and conjunctivae normal ENMT: Mucous membranes are  moist. Posterior pharynx clear of any exudate or lesions.Normal dentition.  Neck: normal, supple, no masses, no thyromegaly Respiratory: clear to auscultation bilaterally, no wheezing, no crackles. Normal respiratory effort. No accessory muscle use.  Cardiovascular: Regular rate and rhythm, no murmurs / rubs / gallops. No extremity edema. 2+ pedal pulses. No carotid bruits.  Abdomen: no tenderness, no masses palpated. No hepatosplenomegaly. Bowel sounds positive.  Musculoskeletal: no clubbing / cyanosis. No joint deformity upper and lower extremities. Good ROM, no contractures. Normal muscle tone.  Skin: no rashes, lesions, ulcers. No induration Neurologic: CN 2-12 grossly intact. Sensation intact, DTR normal. Strength 5/5 in all 4.  Psychiatric: Normal judgment and insight. Alert and oriented x 3. Normal mood.    Condition at discharge: stable  The results of significant diagnostics from this hospitalization (including imaging, microbiology, ancillary and laboratory) are listed below for reference.   Imaging Studies: No results  found.  Microbiology: Results for orders placed or performed during the hospital encounter of 09/06/16  Surgical pcr screen     Status: None   Collection Time: 09/06/16  3:46 PM   Specimen: Nasal Mucosa; Nasal Swab  Result Value Ref Range Status   MRSA, PCR NEGATIVE NEGATIVE Final   Staphylococcus aureus NEGATIVE NEGATIVE Final    Comment:        The Xpert SA Assay (FDA approved for NASAL specimens in patients over 40 years of age), is one component of a comprehensive surveillance program.  Test performance has been validated by [facility] for patients greater than or equal to 70 year old. It is not intended to diagnose infection nor to guide or monitor treatment.     Labs: CBC: Recent Labs  Lab 11/22/22 1127 11/22/22 1653 11/22/22 2143 11/23/22 0243  WBC 7.9  --   --  7.5  NEUTROABS 4.8  --   --   --   HGB 12.3 13.0 12.9 12.2  HCT 38.3 39.5 39.9 37.3  MCV 95.3  --   --  95.2  PLT 200  --   --  201   Basic Metabolic Panel: Recent Labs  Lab 11/22/22 1127 11/23/22 0243  NA 139 141  K 3.9 3.3*  CL 104 101  CO2 30 30  GLUCOSE 99 105*  BUN 18 19  CREATININE 0.85 0.86  CALCIUM 8.3* 8.3*   Liver Function Tests: No results for input(s): "AST", "ALT", "ALKPHOS", "BILITOT", "PROT", "ALBUMIN" in the last 168 hours. CBG: Recent Labs  Lab 11/22/22 1641 11/22/22 2114 11/23/22 0754 11/23/22 1159  GLUCAP 81 98 96 179*    Discharge time spent: greater than 30 minutes.  Signed: Lucile Shutters, MD [facility] 11/23/2022

## 2022-11-24 ENCOUNTER — Telehealth: Payer: Self-pay | Admitting: Nurse Practitioner

## 2022-11-24 NOTE — Telephone Encounter (Signed)
Patient called in and had some questions regarding Bonney's medication. He stated that she was released from the hospital yesterday and not sure which medication she should be taking and shouldn't be taking. He can be reached at [phone]. Thank you!

## 2022-11-25 DIAGNOSIS — I110 Hypertensive heart disease with heart failure: Secondary | ICD-10-CM | POA: Diagnosis not present

## 2022-11-25 DIAGNOSIS — E039 Hypothyroidism, unspecified: Secondary | ICD-10-CM | POA: Diagnosis not present

## 2022-11-25 DIAGNOSIS — M48061 Spinal stenosis, lumbar region without neurogenic claudication: Secondary | ICD-10-CM | POA: Diagnosis not present

## 2022-11-25 DIAGNOSIS — K5909 Other constipation: Secondary | ICD-10-CM | POA: Diagnosis not present

## 2022-11-25 DIAGNOSIS — I2699 Other pulmonary embolism without acute cor pulmonale: Secondary | ICD-10-CM | POA: Diagnosis not present

## 2022-11-25 DIAGNOSIS — I82401 Acute embolism and thrombosis of unspecified deep veins of right lower extremity: Secondary | ICD-10-CM | POA: Diagnosis not present

## 2022-11-25 DIAGNOSIS — S32020D Wedge compression fracture of second lumbar vertebra, subsequent encounter for fracture with routine healing: Secondary | ICD-10-CM | POA: Diagnosis not present

## 2022-11-25 DIAGNOSIS — I5032 Chronic diastolic (congestive) heart failure: Secondary | ICD-10-CM | POA: Diagnosis not present

## 2022-11-25 DIAGNOSIS — E1165 Type 2 diabetes mellitus with hyperglycemia: Secondary | ICD-10-CM | POA: Diagnosis not present

## 2022-11-25 NOTE — Telephone Encounter (Signed)
Called patient and her husband, reviewed hospital discharge summary that was provided to patient with what medication to stop and which once she is allowed to take. Went over list in extensive detail. Answered all questions.  Patient has Hosp f/u scheduled for 11/29/22. Advised Susy Frizzle will go over all information with them at this appt.  FYI to [facility]

## 2022-11-25 NOTE — Telephone Encounter (Signed)
Patients husband called in to ask about his wife medications again. He is requesting a phone call

## 2022-11-29 ENCOUNTER — Encounter: Payer: Self-pay | Admitting: Nurse Practitioner

## 2022-11-29 ENCOUNTER — Ambulatory Visit (INDEPENDENT_AMBULATORY_CARE_PROVIDER_SITE_OTHER): Payer: Medicare HMO | Admitting: Nurse Practitioner

## 2022-11-29 VITALS — BP 130/82 | HR 94 | Temp 97.5°F

## 2022-11-29 DIAGNOSIS — S32020A Wedge compression fracture of second lumbar vertebra, initial encounter for closed fracture: Secondary | ICD-10-CM

## 2022-11-29 DIAGNOSIS — K625 Hemorrhage of anus and rectum: Secondary | ICD-10-CM

## 2022-11-29 DIAGNOSIS — R29898 Other symptoms and signs involving the musculoskeletal system: Secondary | ICD-10-CM | POA: Insufficient documentation

## 2022-11-29 DIAGNOSIS — Z743 Need for continuous supervision: Secondary | ICD-10-CM | POA: Diagnosis not present

## 2022-11-29 DIAGNOSIS — R531 Weakness: Secondary | ICD-10-CM | POA: Diagnosis not present

## 2022-11-29 DIAGNOSIS — Z7984 Long term (current) use of oral hypoglycemic drugs: Secondary | ICD-10-CM

## 2022-11-29 DIAGNOSIS — E118 Type 2 diabetes mellitus with unspecified complications: Secondary | ICD-10-CM

## 2022-11-29 DIAGNOSIS — R600 Localized edema: Secondary | ICD-10-CM | POA: Diagnosis not present

## 2022-11-29 DIAGNOSIS — I10 Essential (primary) hypertension: Secondary | ICD-10-CM

## 2022-11-29 DIAGNOSIS — Z09 Encounter for follow-up examination after completed treatment for conditions other than malignant neoplasm: Secondary | ICD-10-CM

## 2022-11-29 DIAGNOSIS — M549 Dorsalgia, unspecified: Secondary | ICD-10-CM | POA: Diagnosis not present

## 2022-11-29 LAB — CBC
HCT: 39 % (ref 36.0–46.0)
Hemoglobin: 12.5 g/dL (ref 12.0–15.0)
MCHC: 32 g/dL (ref 30.0–36.0)
MCV: 95 fl (ref 78.0–100.0)
Platelets: 301 10*3/uL (ref 150.0–400.0)
RBC: 4.11 Mil/uL (ref 3.87–5.11)
RDW: 15 % (ref 11.5–15.5)
WBC: 8.8 10*3/uL (ref 4.0–10.5)

## 2022-11-29 LAB — COMPREHENSIVE METABOLIC PANEL
ALT: 15 U/L (ref 0–35)
AST: 13 U/L (ref 0–37)
Albumin: 3.5 g/dL (ref 3.5–5.2)
Alkaline Phosphatase: 51 U/L (ref 39–117)
BUN: 12 mg/dL (ref 6–23)
CO2: 32 meq/L (ref 19–32)
Calcium: 9.6 mg/dL (ref 8.4–10.5)
Chloride: 98 meq/L (ref 96–112)
Creatinine, Ser: 0.94 mg/dL (ref 0.40–1.20)
GFR: 57.73 mL/min — ABNORMAL LOW (ref >60.00)
Glucose, Bld: 94 mg/dL (ref 70–99)
Potassium: 4.3 meq/L (ref 3.5–5.1)
Sodium: 141 meq/L (ref 135–145)
Total Bilirubin: 0.7 mg/dL (ref 0.2–1.2)
Total Protein: 6 g/dL (ref 6.0–8.3)

## 2022-11-29 MED ORDER — BACLOFEN 5 MG PO TABS
5.0000 mg | ORAL_TABLET | Freq: Three times a day (TID) | ORAL | 0 refills | Status: DC | PRN
Start: 2022-11-29 — End: 2023-01-02

## 2022-11-29 NOTE — Progress Notes (Signed)
Established Patient Office Visit  Subjective   Patient ID: Stacy Kirk, female    DOB: 1946-03-01  Age: 77 y.o. MRN: [id number]  Chief Complaint  Patient presents with   Hospitalization Follow-up    Pt complains of not feeling well at all. Pt states she thinks she is sick. Pt complains of feet swelling up to leg.    Medication Refill    Pt states she is out of baclofen and ibuprofen and doctor will not refill them.       Hospitla follow up: patinet was originally admitted on 09/30/2018 for close compression fracture of L2.  She does have a history of L4-L5 interbody fusion in 2018.  Patient had fallen.  Neurosurgery mended TLSO brace and physical therapy.  PT recommended SNF placement.  PE incidentally noted on CT abdomen and pelvis confirmed with CTA patient was started on Lovenox and lower extremity Doppler resulted in RLE DVT patient is on Eliquis Patient was seen in the emergency department on 10/21/2022 after SNF discharge and mechanical fall.  She is recommended back to SNF placement  Patient was seen in the emergency department again on 11/22/2018 for with bright red bleeding per rectum.  Patient was admitted and discharged on 11/23/2022  States that she has been home for the past 2 weeks. States that she has been sitting in the recliner.  States that her back is still bothering her. States that she was taking ibuprofen an bacolofen. States that the hosptial stopped both meidcations  Stats that sh eis having PT, OT, and nursing coming to the house.       Review of Systems  Constitutional:  Negative for chills and fever.  Respiratory:  Negative for shortness of breath.   Cardiovascular:  Negative for chest pain.  Gastrointestinal:  Negative for abdominal pain, blood in stool, constipation, diarrhea, nausea and vomiting.       BM 2-3 times a week  Genitourinary:  Negative for dysuria and hematuria.  Neurological:  Negative for headaches.      Objective:     BP  130/82   Pulse 94   Temp (!) 97.5 F (36.4 C) (Temporal)   SpO2 93%    Physical Exam Vitals and nursing note reviewed.  Constitutional:      Appearance: Normal appearance.  Cardiovascular:     Rate and Rhythm: Normal rate and regular rhythm.     Heart sounds: Murmur heard.  Pulmonary:     Effort: Pulmonary effort is normal.     Breath sounds: Normal breath sounds.  Abdominal:     General: Bowel sounds are normal.  Musculoskeletal:     Right lower leg: Edema present.     Left lower leg: Edema present.     Comments: 38.5cm on the right calf  40cm  on the left calf  Neurological:     Mental Status: She is alert.     Comments: LLE 4/5  RLE 5/5      No results found for any visits on 11/29/22.    The ASCVD Risk score (Arnett DK, et al., 2019) failed to calculate for the following reasons:   Cannot find a previous HDL lab   Cannot find a previous total cholesterol lab    Assessment & Plan:   Problem List Items Addressed This Visit       Cardiovascular and Mediastinum   Hypertension    Patient currently maintained on diltiazem and losartan.  Blood pressure under good control.  Continue  medication as prescribed      Relevant Orders   CBC   Comprehensive metabolic panel   TSH     Digestive   BRBPR (bright red blood per rectum)    Has since resolved per patient report she is on anticoagulation.  Patient did not want to take the Protonix medication prescribed at the hospital encourage patient to take medication as prescribed        Endocrine   Controlled type 2 diabetes mellitus with complication, without long-term current use of insulin (HCC)     Nervous and Auditory   Left leg weakness    Given new onset left leg weakness in the setting of compression fracture we will obtain MRI.      Relevant Orders   MR Lumbar Spine Wo Contrast     Musculoskeletal and Integument   Closed compression fracture of L2 lumbar vertebra, initial encounter (HCC)     History of the same was admitted in place to the TLSO brace patient still having pain states they took her off the baclofen and the ibuprofen.  Has not seen neurosurgery since discharge of hospital.      Relevant Medications   Baclofen 5 MG TABS     Other   Hospital discharge follow-up - Primary    Did review several hospitalization records      Lower extremity edema    Multifactorial.  Could be secondary to heart failure versus diltiazem versus dependent edema.  Pending labs today patient is already anticoagulated for DVT/PE      Relevant Orders   CBC   Brain natriuretic peptide   Comprehensive metabolic panel   TSH    Return in about 2 weeks (around 12/13/2022) for weakness/Back pain/swelling .    Audria Nine, NP

## 2022-11-29 NOTE — Assessment & Plan Note (Signed)
Given new onset left leg weakness in the setting of compression fracture we will obtain MRI.

## 2022-11-29 NOTE — Patient Instructions (Signed)
Nice to see you today I want to see you in 2 weeks, sooner if you need me

## 2022-11-29 NOTE — Assessment & Plan Note (Signed)
Multifactorial.  Could be secondary to heart failure versus diltiazem versus dependent edema.  Pending labs today patient is already anticoagulated for DVT/PE

## 2022-11-29 NOTE — Assessment & Plan Note (Addendum)
Has since resolved per patient report she is on anticoagulation.  Patient did not want to take the Protonix medication prescribed at the hospital encourage patient to take medication as prescribed

## 2022-11-29 NOTE — Assessment & Plan Note (Signed)
Did review several hospitalization records

## 2022-11-29 NOTE — Assessment & Plan Note (Signed)
History of the same was admitted in place to the TLSO brace patient still having pain states they took her off the baclofen and the ibuprofen.  Has not seen neurosurgery since discharge of hospital.

## 2022-11-29 NOTE — Assessment & Plan Note (Signed)
Patient currently maintained on diltiazem and losartan.  Blood pressure under good control.  Continue medication as prescribed

## 2022-11-30 DIAGNOSIS — I2699 Other pulmonary embolism without acute cor pulmonale: Secondary | ICD-10-CM | POA: Diagnosis not present

## 2022-11-30 DIAGNOSIS — I5032 Chronic diastolic (congestive) heart failure: Secondary | ICD-10-CM | POA: Diagnosis not present

## 2022-11-30 DIAGNOSIS — M48061 Spinal stenosis, lumbar region without neurogenic claudication: Secondary | ICD-10-CM | POA: Diagnosis not present

## 2022-11-30 DIAGNOSIS — I110 Hypertensive heart disease with heart failure: Secondary | ICD-10-CM | POA: Diagnosis not present

## 2022-11-30 DIAGNOSIS — S32020D Wedge compression fracture of second lumbar vertebra, subsequent encounter for fracture with routine healing: Secondary | ICD-10-CM | POA: Diagnosis not present

## 2022-11-30 DIAGNOSIS — E1165 Type 2 diabetes mellitus with hyperglycemia: Secondary | ICD-10-CM | POA: Diagnosis not present

## 2022-11-30 DIAGNOSIS — E039 Hypothyroidism, unspecified: Secondary | ICD-10-CM | POA: Diagnosis not present

## 2022-11-30 DIAGNOSIS — K5909 Other constipation: Secondary | ICD-10-CM | POA: Diagnosis not present

## 2022-11-30 DIAGNOSIS — I82401 Acute embolism and thrombosis of unspecified deep veins of right lower extremity: Secondary | ICD-10-CM | POA: Diagnosis not present

## 2022-11-30 LAB — BRAIN NATRIURETIC PEPTIDE: Pro B Natriuretic peptide (BNP): 106 pg/mL — ABNORMAL HIGH (ref 0.0–100.0)

## 2022-11-30 LAB — TSH: TSH: 4.4 u[IU]/mL (ref 0.35–5.50)

## 2022-12-01 ENCOUNTER — Telehealth: Payer: Self-pay | Admitting: Nurse Practitioner

## 2022-12-01 DIAGNOSIS — S32020D Wedge compression fracture of second lumbar vertebra, subsequent encounter for fracture with routine healing: Secondary | ICD-10-CM | POA: Diagnosis not present

## 2022-12-01 DIAGNOSIS — M48061 Spinal stenosis, lumbar region without neurogenic claudication: Secondary | ICD-10-CM | POA: Diagnosis not present

## 2022-12-01 DIAGNOSIS — I5032 Chronic diastolic (congestive) heart failure: Secondary | ICD-10-CM | POA: Diagnosis not present

## 2022-12-01 DIAGNOSIS — I110 Hypertensive heart disease with heart failure: Secondary | ICD-10-CM | POA: Diagnosis not present

## 2022-12-01 DIAGNOSIS — E1165 Type 2 diabetes mellitus with hyperglycemia: Secondary | ICD-10-CM | POA: Diagnosis not present

## 2022-12-01 DIAGNOSIS — K5909 Other constipation: Secondary | ICD-10-CM | POA: Diagnosis not present

## 2022-12-01 DIAGNOSIS — I82401 Acute embolism and thrombosis of unspecified deep veins of right lower extremity: Secondary | ICD-10-CM | POA: Diagnosis not present

## 2022-12-01 DIAGNOSIS — I2699 Other pulmonary embolism without acute cor pulmonale: Secondary | ICD-10-CM | POA: Diagnosis not present

## 2022-12-01 DIAGNOSIS — E039 Hypothyroidism, unspecified: Secondary | ICD-10-CM | POA: Diagnosis not present

## 2022-12-01 NOTE — Telephone Encounter (Signed)
Called verbal orders given to [facility] will call If any questions.

## 2022-12-01 NOTE — Telephone Encounter (Signed)
Home Health verbal orders Caller Name:Mark Agency Name: [facility]  Callback number: [phone]  Requesting OT/PT/Skilled nursing/Social Work/Speech: PT  Reason:weakness,gate training,balance,safety training  Frequency:1 WK 1 2WK 4 1WK 2 Started 11/19/2022  Please forward to [facility] pool or providers CMA

## 2022-12-01 NOTE — Telephone Encounter (Signed)
Verbal orders approved.

## 2022-12-02 ENCOUNTER — Telehealth: Payer: Self-pay | Admitting: Nurse Practitioner

## 2022-12-02 DIAGNOSIS — I110 Hypertensive heart disease with heart failure: Secondary | ICD-10-CM | POA: Diagnosis not present

## 2022-12-02 DIAGNOSIS — I82401 Acute embolism and thrombosis of unspecified deep veins of right lower extremity: Secondary | ICD-10-CM | POA: Diagnosis not present

## 2022-12-02 DIAGNOSIS — E1165 Type 2 diabetes mellitus with hyperglycemia: Secondary | ICD-10-CM | POA: Diagnosis not present

## 2022-12-02 DIAGNOSIS — E039 Hypothyroidism, unspecified: Secondary | ICD-10-CM | POA: Diagnosis not present

## 2022-12-02 DIAGNOSIS — M48061 Spinal stenosis, lumbar region without neurogenic claudication: Secondary | ICD-10-CM | POA: Diagnosis not present

## 2022-12-02 DIAGNOSIS — I5032 Chronic diastolic (congestive) heart failure: Secondary | ICD-10-CM | POA: Diagnosis not present

## 2022-12-02 DIAGNOSIS — S32020D Wedge compression fracture of second lumbar vertebra, subsequent encounter for fracture with routine healing: Secondary | ICD-10-CM | POA: Diagnosis not present

## 2022-12-02 DIAGNOSIS — K5909 Other constipation: Secondary | ICD-10-CM | POA: Diagnosis not present

## 2022-12-02 DIAGNOSIS — I2699 Other pulmonary embolism without acute cor pulmonale: Secondary | ICD-10-CM | POA: Diagnosis not present

## 2022-12-02 NOTE — Telephone Encounter (Signed)
Patients husband called in to get lab results from labs drawn on 11/29/2022

## 2022-12-02 NOTE — Telephone Encounter (Signed)
When she was in the hospital in July they did echocardiogram which showed reduced pumping ability of the heart.  This is generally termed as heart failure sometimes when that happens the heart cannot pump the fluid appropriately so it will store in the lower extremities.  This test determines whether the heart placed that there or if there was another etiology

## 2022-12-02 NOTE — Telephone Encounter (Signed)
-----   Message from [facility] T sent at 12/02/2022 11:44 AM EDT ----- Called patient. Spoke with pt's husband [name] Wuthrich. reviewed all information and repeated back to me. Pt would like further explanation on the fluid test for the heart being slightly elevated. Scheduled pt for 1 week lab visit.

## 2022-12-02 NOTE — Telephone Encounter (Signed)
See other encounter note. Reviewed results with pt's husband. [name] Talarico.

## 2022-12-02 NOTE — Telephone Encounter (Signed)
Called patient something wrong with connections and did not hear patient.

## 2022-12-05 NOTE — Telephone Encounter (Signed)
Someone picked up and hung up. Not able to talk to anyone.

## 2022-12-06 ENCOUNTER — Other Ambulatory Visit: Payer: Self-pay | Admitting: Nurse Practitioner

## 2022-12-06 ENCOUNTER — Telehealth: Payer: Self-pay | Admitting: Nurse Practitioner

## 2022-12-06 DIAGNOSIS — I110 Hypertensive heart disease with heart failure: Secondary | ICD-10-CM | POA: Diagnosis not present

## 2022-12-06 DIAGNOSIS — E039 Hypothyroidism, unspecified: Secondary | ICD-10-CM | POA: Diagnosis not present

## 2022-12-06 DIAGNOSIS — I2699 Other pulmonary embolism without acute cor pulmonale: Secondary | ICD-10-CM | POA: Diagnosis not present

## 2022-12-06 DIAGNOSIS — S32020D Wedge compression fracture of second lumbar vertebra, subsequent encounter for fracture with routine healing: Secondary | ICD-10-CM | POA: Diagnosis not present

## 2022-12-06 DIAGNOSIS — I82401 Acute embolism and thrombosis of unspecified deep veins of right lower extremity: Secondary | ICD-10-CM | POA: Diagnosis not present

## 2022-12-06 DIAGNOSIS — E1165 Type 2 diabetes mellitus with hyperglycemia: Secondary | ICD-10-CM | POA: Diagnosis not present

## 2022-12-06 DIAGNOSIS — I5032 Chronic diastolic (congestive) heart failure: Secondary | ICD-10-CM | POA: Diagnosis not present

## 2022-12-06 DIAGNOSIS — K5909 Other constipation: Secondary | ICD-10-CM | POA: Diagnosis not present

## 2022-12-06 DIAGNOSIS — M48061 Spinal stenosis, lumbar region without neurogenic claudication: Secondary | ICD-10-CM | POA: Diagnosis not present

## 2022-12-06 MED ORDER — DILTIAZEM HCL ER COATED BEADS 120 MG PO CP24: 120.0000 mg | ORAL_CAPSULE | Freq: Every day | ORAL | 0 refills | Status: DC

## 2022-12-06 NOTE — Telephone Encounter (Signed)
Called patient number as well as emergency contact. Both times someone picked up you can hear background noise then call is disconnected. This is the 4th time calling patient. Ok to mail letter to patient?

## 2022-12-06 NOTE — Telephone Encounter (Signed)
Letter is fine.

## 2022-12-06 NOTE — Telephone Encounter (Signed)
Prescription Request  12/06/2022  LOV: 11/29/2022  What is the name of the medication or equipment? diltiazem (CARDIZEM CD) 120 MG 24 hr capsule   Have you contacted your pharmacy to request a refill? Yes   Which pharmacy would you like this sent to?  [id number], [place] - [address] [place] [address] Phone: [phone] Fax: [phone]    Patient notified that their request is being sent to the clinical staff for review and that they should receive a response within 2 business days.   Please advise at Mobile [phone]

## 2022-12-06 NOTE — Telephone Encounter (Signed)
Attempted to reach pt. Call went through. Heard some noise and then the phone hung up.

## 2022-12-06 NOTE — Telephone Encounter (Signed)
Not given by you in the past ok to refill as pended.

## 2022-12-06 NOTE — Telephone Encounter (Signed)
Lab reached out saying that she has a lab appointment but I dont see where I sent in the fluid pill. Can we call and verify that they have not taken it. If not postpone the lab for a week and send me a message and I will write the medication

## 2022-12-07 ENCOUNTER — Other Ambulatory Visit: Payer: Self-pay | Admitting: Nurse Practitioner

## 2022-12-07 DIAGNOSIS — E1165 Type 2 diabetes mellitus with hyperglycemia: Secondary | ICD-10-CM | POA: Diagnosis not present

## 2022-12-07 DIAGNOSIS — E039 Hypothyroidism, unspecified: Secondary | ICD-10-CM | POA: Diagnosis not present

## 2022-12-07 DIAGNOSIS — M48061 Spinal stenosis, lumbar region without neurogenic claudication: Secondary | ICD-10-CM | POA: Diagnosis not present

## 2022-12-07 DIAGNOSIS — I5032 Chronic diastolic (congestive) heart failure: Secondary | ICD-10-CM | POA: Diagnosis not present

## 2022-12-07 DIAGNOSIS — I110 Hypertensive heart disease with heart failure: Secondary | ICD-10-CM | POA: Diagnosis not present

## 2022-12-07 DIAGNOSIS — S32020D Wedge compression fracture of second lumbar vertebra, subsequent encounter for fracture with routine healing: Secondary | ICD-10-CM | POA: Diagnosis not present

## 2022-12-07 DIAGNOSIS — K5909 Other constipation: Secondary | ICD-10-CM | POA: Diagnosis not present

## 2022-12-07 DIAGNOSIS — I2699 Other pulmonary embolism without acute cor pulmonale: Secondary | ICD-10-CM | POA: Diagnosis not present

## 2022-12-07 DIAGNOSIS — I82401 Acute embolism and thrombosis of unspecified deep veins of right lower extremity: Secondary | ICD-10-CM | POA: Diagnosis not present

## 2022-12-07 NOTE — Addendum Note (Signed)
Addended by: Eden Emms on: 12/07/2022 04:20 PM   Modules accepted: Orders

## 2022-12-07 NOTE — Telephone Encounter (Signed)
Called pt. No answer. Heard beep noise, confused it for answering machine and began voicemail prompt but the call hung up. Unable to leave voicemail.

## 2022-12-08 DIAGNOSIS — S32020D Wedge compression fracture of second lumbar vertebra, subsequent encounter for fracture with routine healing: Secondary | ICD-10-CM | POA: Diagnosis not present

## 2022-12-08 DIAGNOSIS — E1165 Type 2 diabetes mellitus with hyperglycemia: Secondary | ICD-10-CM | POA: Diagnosis not present

## 2022-12-08 DIAGNOSIS — K5909 Other constipation: Secondary | ICD-10-CM | POA: Diagnosis not present

## 2022-12-08 DIAGNOSIS — I110 Hypertensive heart disease with heart failure: Secondary | ICD-10-CM | POA: Diagnosis not present

## 2022-12-08 DIAGNOSIS — I5032 Chronic diastolic (congestive) heart failure: Secondary | ICD-10-CM | POA: Diagnosis not present

## 2022-12-08 DIAGNOSIS — E039 Hypothyroidism, unspecified: Secondary | ICD-10-CM | POA: Diagnosis not present

## 2022-12-08 DIAGNOSIS — I82401 Acute embolism and thrombosis of unspecified deep veins of right lower extremity: Secondary | ICD-10-CM | POA: Diagnosis not present

## 2022-12-08 DIAGNOSIS — M48061 Spinal stenosis, lumbar region without neurogenic claudication: Secondary | ICD-10-CM | POA: Diagnosis not present

## 2022-12-08 DIAGNOSIS — I2699 Other pulmonary embolism without acute cor pulmonale: Secondary | ICD-10-CM | POA: Diagnosis not present

## 2022-12-08 NOTE — Telephone Encounter (Signed)
Letter mailed will close and await patient call back.

## 2022-12-09 ENCOUNTER — Other Ambulatory Visit: Payer: Medicare HMO

## 2022-12-09 ENCOUNTER — Encounter: Payer: Self-pay | Admitting: Internal Medicine

## 2022-12-09 ENCOUNTER — Ambulatory Visit (INDEPENDENT_AMBULATORY_CARE_PROVIDER_SITE_OTHER): Payer: Medicare HMO | Admitting: Internal Medicine

## 2022-12-09 VITALS — BP 132/84 | HR 96 | Temp 97.0°F | Resp 18

## 2022-12-09 DIAGNOSIS — Z7401 Bed confinement status: Secondary | ICD-10-CM | POA: Diagnosis not present

## 2022-12-09 DIAGNOSIS — I5022 Chronic systolic (congestive) heart failure: Secondary | ICD-10-CM

## 2022-12-09 DIAGNOSIS — S32020G Wedge compression fracture of second lumbar vertebra, subsequent encounter for fracture with delayed healing: Secondary | ICD-10-CM | POA: Diagnosis not present

## 2022-12-09 DIAGNOSIS — I2699 Other pulmonary embolism without acute cor pulmonale: Secondary | ICD-10-CM

## 2022-12-09 DIAGNOSIS — R531 Weakness: Secondary | ICD-10-CM | POA: Diagnosis not present

## 2022-12-09 MED ORDER — FUROSEMIDE 40 MG PO TABS: 40.0000 mg | ORAL_TABLET | Freq: Every day | ORAL | 1 refills | Status: DC | PRN

## 2022-12-09 NOTE — Patient Instructions (Signed)
Low-Sodium Eating Plan Salt (sodium) helps you keep a healthy balance of fluids in your body. Too much sodium can raise your blood pressure. It can also cause fluid and waste to be held in your body. Your health care provider or dietitian may recommend a low-sodium eating plan if you have high blood pressure (hypertension), kidney disease, liver disease, or heart failure. Eating less sodium can help lower your blood pressure and reduce swelling. It can also protect your heart, liver, and kidneys. What are tips for following this plan? Reading food labels  Check food labels for the amount of sodium per serving. If you eat more than one serving, you must multiply the listed amount by the number of servings. Choose foods with less than 140 milligrams (mg) of sodium per serving. Avoid foods with 300 mg of sodium or more per serving. Always check how much sodium is in a product, even if the label says "unsalted" or "no salt added." Shopping  Buy products labeled as "low-sodium" or "no salt added." Buy fresh foods. Avoid canned foods and pre-made or frozen meals. Avoid canned, cured, or processed meats. Buy breads that have less than 80 mg of sodium per slice. Cooking  Eat more home-cooked food. Try to eat less restaurant, buffet, and fast food. Try not to add salt when you cook. Use salt-free seasonings or herbs instead of table salt or sea salt. Check with your provider or pharmacist before using salt substitutes. Cook with plant-based oils, such as canola, sunflower, or olive oil. Meal planning When eating at a restaurant, ask if your food can be made with less salt or no salt. Avoid dishes labeled as brined, pickled, cured, or smoked. Avoid dishes made with soy sauce, miso, or teriyaki sauce. Avoid foods that have monosodium glutamate (MSG) in them. MSG may be added to some restaurant food, sauces, soups, bouillon, and canned foods. Make meals that can be grilled, baked, poached, roasted, or  steamed. These are often made with less sodium. General information Try to limit your sodium intake to 1,500-2,300 mg each day, or the amount told by your provider. What foods should I eat? Fruits Fresh, frozen, or canned fruit. Fruit juice. Vegetables Fresh or frozen vegetables. "No salt added" canned vegetables. "No salt added" tomato sauce and paste. Low-sodium or reduced-sodium tomato and vegetable juice. Grains Low-sodium cereals, such as oats, puffed wheat and rice, and shredded wheat. Low-sodium crackers. Unsalted rice. Unsalted pasta. Low-sodium bread. Whole grain breads and whole grain pasta. Meats and other proteins Fresh or frozen meat, poultry, seafood, and fish. These should have no added salt. Low-sodium canned tuna and salmon. Unsalted nuts. Dried peas, beans, and lentils without added salt. Unsalted canned beans. Eggs. Unsalted nut butters. Dairy Milk. Soy milk. Cheese that is naturally low in sodium, such as ricotta cheese, fresh mozzarella, or Swiss cheese. Low-sodium or reduced-sodium cheese. Cream cheese. Yogurt. Seasonings and condiments Fresh and dried herbs and spices. Salt-free seasonings. Low-sodium mustard and ketchup. Sodium-free salad dressing. Sodium-free light mayonnaise. Fresh or refrigerated horseradish. Lemon juice. Vinegar. Other foods Homemade, reduced-sodium, or low-sodium soups. Unsalted popcorn and pretzels. Low-salt or salt-free chips. The items listed above may not be all the foods and drinks you can have. Talk to a dietitian to learn more. What foods should I avoid? Vegetables Sauerkraut, pickled vegetables, and relishes. Olives. [place] fries. Onion rings. Regular canned vegetables, except low-sodium or reduced-sodium items. Regular canned tomato sauce and paste. Regular tomato and vegetable juice. Frozen vegetables in sauces. Grains Instant  hot cereals. Bread stuffing, pancake, and biscuit mixes. Croutons. Seasoned rice or pasta mixes. Noodle soup  cups. Boxed or frozen macaroni and cheese. Regular salted crackers. Self-rising flour. Meats and other proteins Meat or fish that is salted, canned, smoked, spiced, or pickled. Precooked or cured meat, such as sausages or meat loaves. Tomasa Blase. Ham. Pepperoni. Hot dogs. Corned beef. Chipped beef. Salt pork. Jerky. Pickled herring, anchovies, and sardines. Regular canned tuna. Salted nuts. Dairy Processed cheese and cheese spreads. Hard cheeses. Cheese curds. Blue cheese. Feta cheese. String cheese. Regular cottage cheese. Buttermilk. Canned milk. Fats and oils Salted butter. Regular margarine. Ghee. Bacon fat. Seasonings and condiments Onion salt, garlic salt, seasoned salt, table salt, and sea salt. Canned and packaged gravies. Worcestershire sauce. Tartar sauce. Barbecue sauce. Teriyaki sauce. Soy sauce, including reduced-sodium soy sauce. Steak sauce. Fish sauce. Oyster sauce. Cocktail sauce. Horseradish that you find on the shelf. Regular ketchup and mustard. Meat flavorings and tenderizers. Bouillon cubes. Hot sauce. Pre-made or packaged marinades. Pre-made or packaged taco seasonings. Relishes. Regular salad dressings. Salsa. Other foods Salted popcorn and pretzels. Corn chips and puffs. Potato and tortilla chips. Canned or dried soups. Pizza. Frozen entrees and pot pies. The items listed above may not be all the foods and drinks you should avoid. Talk to a dietitian to learn more. This information is not intended to replace advice given to you by your health care provider. Make sure you discuss any questions you have with your health care provider. Document Revised: 03/31/2022 Document Reviewed: 03/31/2022 Elsevier Patient Education  2024 [employer].

## 2022-12-09 NOTE — Assessment & Plan Note (Signed)
Still on the eliquis

## 2022-12-09 NOTE — Assessment & Plan Note (Signed)
Echo showed EF 40-45% in July Didn't appear to have right heart strain after the PE--but don't know if that could have changed Appears to be recently started on diltiazem--that could be a factor Discussed no salt--can use salt substitute Will start furosemide 40mg  daily--to use prn BNP was only borderline--nothing to suggest pulmonary involvement at this point

## 2022-12-09 NOTE — Progress Notes (Signed)
Subjective:    Patient ID: Stacy Kirk, female    DOB: March 10, 1946, 77 y.o.   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     Baclofen 5 MG TABS Take 1 tablet (5 mg total) by mouth 3 (three) times daily as needed. 90 tablet 0   diltiazem (CARDIZEM CD) 120 MG 24 hr capsule Take 1 capsule (120 mg total) by mouth daily. 90 capsule 0   docusate sodium (COLACE) 100 MG capsule Take 100 mg by mouth at bedtime.     levothyroxine (SYNTHROID) 100 MCG tablet TAKE 1 TABLET BY MOUTH EVERY DAY 90 tablet 1   metFORMIN (GLUCOPHAGE) 500 MG tablet Take 1 tablet (500 mg total) by mouth daily after supper. (Patient taking differently: Take 500 mg by mouth every evening.) 90 tablet 1   pantoprazole (PROTONIX) 40 MG tablet Take 1 tablet (40 mg total) by mouth daily. 30 tablet 1   predniSONE (DELTASONE) 10 MG tablet Take 1 tablet (10 mg total) by mouth daily with breakfast. 15 tablet 0   apixaban (ELIQUIS) 5 MG TABS tablet Take 2 tablets (10 mg total) by mouth 2 (two) times daily for 2 days, THEN 1 tablet (5 mg total) 2 (two) times daily. (Patient taking differently: Take 5 mg by mouth two times a day) 68 tablet 0   losartan (COZAAR) 25 MG tablet Take 1 tablet (25 mg total) by mouth daily. 30 tablet 0   No current facility-administered medications on file prior to  visit.    Allergies  Allergen Reactions   Erythromycin Diarrhea and Other (See Comments)    "Tears" her stomach up. Major digestive upset.   Tramadol Shortness Of Breath and Other (See Comments)    Felt like lungs filled up; unable to lay down   Albumin (Human) Other (See Comments)    Pt unsure of reaction   Aspirin Itching and Other (See Comments)    Caused lethargy, also   Codeine Itching and Other (See Comments)    Cause lethargy, also   Cortisone Other (See Comments)    NO STEROID INJECTIONS; headaches   Linzess [Linaclotide] Diarrhea   Propoxyphene Other (See Comments)    Terrible reaction; headaches   Simvastatin Itching and Other (See Comments)    Severe muscle aches   Triamcinolone Acetonide Other (See Comments)    headache    Past Medical History:  Diagnosis Date   Arthritis    Cancer (HCC)    ovarian, skin face- skin cancer   Complication of anesthesia    slow to awaken everday, even slower with anesthesia   Constipation    Diabetes mellitus    Type II   GERD (gastroesophageal reflux disease)    Headache 05/12/2015   "after a fall" headache for 8 months   Hemorrhoids    Hernia    History of  kidney stones    passed   Hypertension    Hypothyroidism    Loss of appetite    Muscle spasm    back   Osteoporosis    Redness    abd wound   Urine frequency    unable to hold urine    Wears hearing aid    bilateral     Past Surgical History:  Procedure Laterality Date   ABDOMINAL HYSTERECTOMY     APPENDECTOMY     CHOLECYSTECTOMY     COLONOSCOPY W/ POLYPECTOMY     HERNIA REPAIR  Jun2012   open component separation w biologic mesh (Dr. Luisa Hart)   TOE SURGERY Left    2nd toe   TONSILLECTOMY      Family History  Problem Relation Age of Onset   Diabetes Mother    Early death Sister    Drug abuse Sister     Social History   Socioeconomic History   Marital status: Married    Spouse name: Not on file   Number of children: 2   Years of education:  Not on file   Highest education level: Not on file  Occupational History   Not on file  Tobacco Use   Smoking status: Former    Current packs/day: 0.00    Types: Cigarettes    Start date: 03/29/1983    Quit date: 03/28/1998    Years since quitting: 24.7   Smokeless tobacco: Never   Tobacco comments:    quit 2002  Vaping Use   Vaping status: Never Used  Substance and Sexual Activity   Alcohol use: No   Drug use: No   Sexual activity: Not on file  Other Topics Concern   Not on file  Social History Narrative   Retired      International aid/development worker of Corporate investment banker Strain: Not on file  Food Insecurity: No Food Insecurity (11/22/2022)   Hunger Vital Sign    Worried About Running Out of Food in the Last Year: Never true    Ran Out of Food in the Last Year: Never true  Transportation Needs: No Transportation Needs (11/22/2022)   PRAPARE - Administrator, Civil Service (Medical): No    Lack of Transportation (Non-Medical): No  Physical Activity: Not on file  Stress: Not on file  Social Connections: Not on file  Intimate Partner Violence: Not At Risk (11/22/2022)   Humiliation, Afraid, Rape, and Kick questionnaire    Fear of Current or Ex-Partner: No    Emotionally Abused: No    Physically Abused: No    Sexually Abused: No   Review of Systems Will occasionally use salt Keeps up for sleep---no PND    Objective:   Physical Exam Constitutional:      Appearance: Normal appearance.  Cardiovascular:     Rate and Rhythm: Normal rate and regular rhythm.     Heart sounds: No murmur heard.    No gallop.  Pulmonary:     Effort: Pulmonary effort is normal.     Breath sounds: Normal breath sounds. No wheezing or rales.  Musculoskeletal:     Cervical back: Neck supple.     Comments: 2-3+ pitting edema in feet---with non pitting edema in calves (less so)  Lymphadenopathy:     Cervical: No cervical adenopathy.  Neurological:     Mental Status: She is alert.   Psychiatric:        Mood and Affect: Mood normal.  Assessment & Plan:

## 2022-12-09 NOTE — Assessment & Plan Note (Signed)
Still very limited Home PT

## 2022-12-12 DIAGNOSIS — K5909 Other constipation: Secondary | ICD-10-CM | POA: Diagnosis not present

## 2022-12-12 DIAGNOSIS — S32020D Wedge compression fracture of second lumbar vertebra, subsequent encounter for fracture with routine healing: Secondary | ICD-10-CM | POA: Diagnosis not present

## 2022-12-12 DIAGNOSIS — M48061 Spinal stenosis, lumbar region without neurogenic claudication: Secondary | ICD-10-CM | POA: Diagnosis not present

## 2022-12-12 DIAGNOSIS — E039 Hypothyroidism, unspecified: Secondary | ICD-10-CM | POA: Diagnosis not present

## 2022-12-12 DIAGNOSIS — I82401 Acute embolism and thrombosis of unspecified deep veins of right lower extremity: Secondary | ICD-10-CM | POA: Diagnosis not present

## 2022-12-12 DIAGNOSIS — I5032 Chronic diastolic (congestive) heart failure: Secondary | ICD-10-CM | POA: Diagnosis not present

## 2022-12-12 DIAGNOSIS — I110 Hypertensive heart disease with heart failure: Secondary | ICD-10-CM | POA: Diagnosis not present

## 2022-12-12 DIAGNOSIS — I2699 Other pulmonary embolism without acute cor pulmonale: Secondary | ICD-10-CM | POA: Diagnosis not present

## 2022-12-12 DIAGNOSIS — E1165 Type 2 diabetes mellitus with hyperglycemia: Secondary | ICD-10-CM | POA: Diagnosis not present

## 2022-12-13 ENCOUNTER — Telehealth: Payer: Self-pay | Admitting: Nurse Practitioner

## 2022-12-13 NOTE — Telephone Encounter (Signed)
Pt's husband, Stacy Kirk, called stating Dr. Alphonsus Sias prescribed furosemide (LASIX) 40 MG tablet, for the pt's swelling in her ankles. Stacy Kirk stated the pt Is still experiencing swelling & wanted to know does the meds take awhile to start working or does she need to be reevaluated? Call back # [phone]

## 2022-12-14 ENCOUNTER — Ambulatory Visit: Payer: Medicare HMO | Admitting: Nurse Practitioner

## 2022-12-14 ENCOUNTER — Telehealth: Payer: Self-pay

## 2022-12-14 NOTE — Telephone Encounter (Signed)
The patient is taking the 40mg  of lasix and still has the swelling correct? Have they tried compression garments (socks) also I assume they are keeping her feet elevated while at home?

## 2022-12-14 NOTE — Telephone Encounter (Signed)
Spoke to pt's husband. She is not worsening and is not having shortness of breath/trouble breathing. Advised him we have sent the message to Audria Nine, her PCP, for his guidance.

## 2022-12-14 NOTE — Telephone Encounter (Signed)
HH paperwork faxed. Placed form In Amy's folder up front.

## 2022-12-14 NOTE — Telephone Encounter (Signed)
Pt's husband is calling for a update on what to do about pt's swelling? Pt's husband states he's very concerned about the swelling. Please advise. Call back # [phone]

## 2022-12-15 DIAGNOSIS — I5032 Chronic diastolic (congestive) heart failure: Secondary | ICD-10-CM | POA: Diagnosis not present

## 2022-12-15 DIAGNOSIS — S32020D Wedge compression fracture of second lumbar vertebra, subsequent encounter for fracture with routine healing: Secondary | ICD-10-CM | POA: Diagnosis not present

## 2022-12-15 DIAGNOSIS — I82401 Acute embolism and thrombosis of unspecified deep veins of right lower extremity: Secondary | ICD-10-CM | POA: Diagnosis not present

## 2022-12-15 DIAGNOSIS — E039 Hypothyroidism, unspecified: Secondary | ICD-10-CM | POA: Diagnosis not present

## 2022-12-15 DIAGNOSIS — E1165 Type 2 diabetes mellitus with hyperglycemia: Secondary | ICD-10-CM | POA: Diagnosis not present

## 2022-12-15 DIAGNOSIS — I2699 Other pulmonary embolism without acute cor pulmonale: Secondary | ICD-10-CM | POA: Diagnosis not present

## 2022-12-15 DIAGNOSIS — K5909 Other constipation: Secondary | ICD-10-CM | POA: Diagnosis not present

## 2022-12-15 DIAGNOSIS — M48061 Spinal stenosis, lumbar region without neurogenic claudication: Secondary | ICD-10-CM | POA: Diagnosis not present

## 2022-12-15 DIAGNOSIS — I110 Hypertensive heart disease with heart failure: Secondary | ICD-10-CM | POA: Diagnosis not present

## 2022-12-15 NOTE — Telephone Encounter (Addendum)
Called spoke to [place] [facility] from home health. They have sent below picture in email. They have not been using compression stockings she does not like the way they feel. Judeth Cornfield is working with patient to increase ways to elevated legs. I have spent time with patient, husband and nurse discussing options for getting patient seen. Patient refused ed or urgent care. I have reviewed all red words with patient and husband and have been repeated back to me. They have also reviewed with nurse that is at the house now.  I have set up in office for visit with provider in office tomorrow. They will call 911 if any red words

## 2022-12-15 NOTE — Telephone Encounter (Signed)
Judeth Cornfield, pt's [facility] nurse, called checking on update of messages sent back by husband for pt. Told stephanie, cable's response. Judeth Cornfield states pt does take 40mg  of lasix & feet is elevated. Judeth Cornfield stated pt feet won't stay in compression socks, when attempted to wear them. Stephanie sent pics of pt's feet to office email, fwd email to [place] to review. Call back # [phone].

## 2022-12-16 ENCOUNTER — Ambulatory Visit (INDEPENDENT_AMBULATORY_CARE_PROVIDER_SITE_OTHER): Payer: Medicare HMO | Admitting: Family

## 2022-12-16 ENCOUNTER — Encounter: Payer: Self-pay | Admitting: Family

## 2022-12-16 ENCOUNTER — Telehealth: Payer: Self-pay | Admitting: Nurse Practitioner

## 2022-12-16 VITALS — BP 132/64 | HR 46 | Temp 97.2°F | Wt 184.0 lb

## 2022-12-16 DIAGNOSIS — R600 Localized edema: Secondary | ICD-10-CM

## 2022-12-16 DIAGNOSIS — R609 Edema, unspecified: Secondary | ICD-10-CM

## 2022-12-16 DIAGNOSIS — Z7401 Bed confinement status: Secondary | ICD-10-CM | POA: Diagnosis not present

## 2022-12-16 LAB — COMPREHENSIVE METABOLIC PANEL
ALT: 11 U/L (ref 0–35)
AST: 13 U/L (ref 0–37)
Albumin: 3.5 g/dL (ref 3.5–5.2)
Alkaline Phosphatase: 39 U/L (ref 39–117)
BUN: 24 mg/dL — ABNORMAL HIGH (ref 6–23)
CO2: 37 mEq/L — ABNORMAL HIGH (ref 19–32)
Calcium: 9.3 mg/dL (ref 8.4–10.5)
Chloride: 91 mEq/L — ABNORMAL LOW (ref 96–112)
Creatinine, Ser: 1.14 mg/dL (ref 0.40–1.20)
GFR: 45.78 mL/min — ABNORMAL LOW (ref >60.00)
Glucose, Bld: 152 mg/dL — ABNORMAL HIGH (ref 70–99)
Potassium: 3.4 mEq/L — ABNORMAL LOW (ref 3.5–5.1)
Sodium: 139 mEq/L (ref 135–145)
Total Bilirubin: 0.9 mg/dL (ref 0.2–1.2)
Total Protein: 6 g/dL (ref 6.0–8.3)

## 2022-12-16 LAB — BRAIN NATRIURETIC PEPTIDE: Pro B Natriuretic peptide (BNP): 31 pg/mL (ref 0.0–100.0)

## 2022-12-16 MED ORDER — FUROSEMIDE 80 MG PO TABS: 80.0000 mg | ORAL_TABLET | Freq: Every day | ORAL | 3 refills | Status: DC

## 2022-12-16 MED ORDER — METOPROLOL SUCCINATE ER 25 MG PO TB24: 25.0000 mg | ORAL_TABLET | Freq: Every day | ORAL | 0 refills | Status: DC

## 2022-12-16 NOTE — Telephone Encounter (Signed)
I see she has an appointment set up with me on 12/30/2022  We can discontinue the cardizem and place her on metoprolol. This may help with the swelling. Can we make sure the home health nurse is checking vitals inclusive of pulse and report back to the office.   We will start with 25mg  daily but depending on blood pressure we may need to increase after

## 2022-12-16 NOTE — Progress Notes (Signed)
Acute Office Visit  Subjective:     Patient ID: Stacy Kirk, female    DOB: 04-May-1945, 77 y.o.   MRN: [id number]  Chief Complaint  Patient presents with  . Leg Swelling    Bilateral leg swelling     HPI Patient is in today accompanied by her husband, patient of Matt cable with concerns of persistent bilateral leg swelling.  Patient was seen by NP cable on November 29, 2022.  Labs were obtained at that time that were virtually stable but showed a BNP of 106.  Patient has been taking Lasix 40 mg once daily and tolerating it well.  She reports that her left foot is some better but her right foot continues to be swollen.  She denies any shortness of breath or chest pain.  Review of Systems  HENT: Negative.    Respiratory: Negative.    Cardiovascular:  Positive for leg swelling. Negative for chest pain and palpitations.  Gastrointestinal: Negative.   Genitourinary: Negative.   Neurological: Negative.   Endo/Heme/Allergies: Negative.   Psychiatric/Behavioral: Negative.    All other systems reviewed and are negative. Past Medical History:  Diagnosis Date  . Arthritis   . Cancer (HCC)    ovarian, skin face- skin cancer  . Complication of anesthesia    slow to awaken everday, even slower with anesthesia  . Constipation   . Diabetes mellitus    Type II  . GERD (gastroesophageal reflux disease)   . Headache 05/12/2015   "after a fall" headache for 8 months  . Hemorrhoids   . Hernia   . History of kidney stones    passed  . Hypertension   . Hypothyroidism   . Loss of appetite   . Muscle spasm    back  . Osteoporosis   . Redness    abd wound  . Urine frequency    unable to hold urine   . Wears hearing aid    bilateral     Social History   Socioeconomic History  . Marital status: Married    Spouse name: Not on file  . Number of children: 2  . Years of education: Not on file  . Highest education level: Not on file  Occupational History  . Not on file   Tobacco Use  . Smoking status: Former    Current packs/day: 0.00    Types: Cigarettes    Start date: 03/29/1983    Quit date: 03/28/1998    Years since quitting: 24.7  . Smokeless tobacco: Never  . Tobacco comments:    quit 2002  Vaping Use  . Vaping status: Never Used  Substance and Sexual Activity  . Alcohol use: No  . Drug use: No  . Sexual activity: Not on file  Other Topics Concern  . Not on file  Social History Narrative   Retired      Chemical engineer Strain: Not on file  Food Insecurity: No Food Insecurity (11/22/2022)   Hunger Vital Sign   . Worried About Programme researcher, broadcasting/film/video in the Last Year: Never true   . Ran Out of Food in the Last Year: Never true  Transportation Needs: No Transportation Needs (11/22/2022)   PRAPARE - Transportation   . Lack of Transportation (Medical): No   . Lack of Transportation (Non-Medical): No  Physical Activity: Not on file  Stress: Not on file  Social Connections: Not on file  Intimate Partner Violence: Not At Risk (  11/22/2022)   Humiliation, Afraid, Rape, and Kick questionnaire   . Fear of Current or Ex-Partner: No   . Emotionally Abused: No   . Physically Abused: No   . Sexually Abused: No    Past Surgical History:  Procedure Laterality Date  . ABDOMINAL HYSTERECTOMY    . APPENDECTOMY    . CHOLECYSTECTOMY    . COLONOSCOPY W/ POLYPECTOMY    . HERNIA REPAIR  Jun2012   open component separation w biologic mesh (Dr. Luisa Hart)  . TOE SURGERY Left    2nd toe  . TONSILLECTOMY      Family History  Problem Relation Age of Onset  . Diabetes Mother   . Early death Sister   . Drug abuse Sister     Allergies  Allergen Reactions  . Erythromycin Diarrhea and Other (See Comments)    "Tears" her stomach up. Major digestive upset.  . Tramadol Shortness Of Breath and Other (See Comments)    Felt like lungs filled up; unable to lay down  . Albumin (Human) Other (See Comments)    Pt unsure of  reaction  . Aspirin Itching and Other (See Comments)    Caused lethargy, also  . Codeine Itching and Other (See Comments)    Cause lethargy, also  . Cortisone Other (See Comments)    NO STEROID INJECTIONS; headaches  . Linzess [Linaclotide] Diarrhea  . Propoxyphene Other (See Comments)    Terrible reaction; headaches  . Simvastatin Itching and Other (See Comments)    Severe muscle aches  . Triamcinolone Acetonide Other (See Comments)    headache    Current Outpatient Medications on File Prior to Visit  Medication Sig Dispense Refill  . acetaminophen (TYLENOL) 500 MG tablet Take 1,000 mg by mouth every 6 (six) hours as needed (FOR PAIN.).    Marland Kitchen Baclofen 5 MG TABS Take 1 tablet (5 mg total) by mouth 3 (three) times daily as needed. 90 tablet 0  . levothyroxine (SYNTHROID) 100 MCG tablet TAKE 1 TABLET BY MOUTH EVERY DAY 90 tablet 1  . metFORMIN (GLUCOPHAGE) 500 MG tablet Take 1 tablet (500 mg total) by mouth daily after supper. (Patient taking differently: Take 500 mg by mouth every evening.) 90 tablet 1  . pantoprazole (PROTONIX) 40 MG tablet Take 1 tablet (40 mg total) by mouth daily. 30 tablet 1  . predniSONE (DELTASONE) 10 MG tablet Take 1 tablet (10 mg total) by mouth daily with breakfast. 15 tablet 0  . apixaban (ELIQUIS) 5 MG TABS tablet Take 2 tablets (10 mg total) by mouth 2 (two) times daily for 2 days, THEN 1 tablet (5 mg total) 2 (two) times daily. (Patient taking differently: Take 5 mg by mouth two times a day) 68 tablet 0  . docusate sodium (COLACE) 100 MG capsule Take 100 mg by mouth at bedtime. (Patient not taking: Reported on 12/16/2022)    . losartan (COZAAR) 25 MG tablet Take 1 tablet (25 mg total) by mouth daily. 30 tablet 0  . metoprolol succinate (TOPROL-XL) 25 MG 24 hr tablet Take 1 tablet (25 mg total) by mouth daily. (Patient not taking: Reported on 12/16/2022) 90 tablet 0   No current facility-administered medications on file prior to visit.    BP 132/64   Pulse  (!) 46   Temp (!) 97.2 F (36.2 C) (Temporal)   Wt 184 lb (83.5 kg) Comment: Per patient  SpO2 94%   BMI 34.78 kg/m chart      Objective:    BP  132/64   Pulse (!) 46   Temp (!) 97.2 F (36.2 C) (Temporal)   Wt 184 lb (83.5 kg) Comment: Per patient  SpO2 94%   BMI 34.78 kg/m    Physical Exam Vitals and nursing note reviewed. Chaperone present: Husband present.  As well as EMS transport.  Constitutional:      Appearance: Normal appearance.     Comments: Patient lying on a stretcher  Cardiovascular:     Rate and Rhythm: Normal rate and regular rhythm.  Pulmonary:     Effort: Pulmonary effort is normal.     Breath sounds: Normal breath sounds.  Abdominal:     General: Abdomen is flat. Bowel sounds are normal.     Palpations: Abdomen is soft.  Musculoskeletal:        General: No tenderness.     Right lower leg: Edema present.     Left lower leg: Edema present.     Comments: Negative Homans' sign.  3+ pitting edema  Skin:    General: Skin is warm and dry.  Neurological:     General: No focal deficit present.     Mental Status: She is alert and oriented to person, place, and time.  Psychiatric:        Mood and Affect: Mood normal.        Behavior: Behavior normal.   No results found for any visits on 12/16/22.      Assessment & Plan:   Problem List Items Addressed This Visit   None Visit Diagnoses     3+ pitting edema    -  Primary   Relevant Orders   Brain natriuretic peptide   Comprehensive metabolic panel   Fluid retention in legs       Relevant Orders   Brain natriuretic peptide   Comprehensive metabolic panel       Meds ordered this encounter  Medications  . furosemide (LASIX) 80 MG tablet    Sig: Take 1 tablet (80 mg total) by mouth daily.    Dispense:  30 tablet    Refill:  3  Keep appointment with Audria Nine, NP for next week.  I have increased Lasix to 80 mg once daily to help reduce fluid retention in the legs.  Advised patient and  husband to call the office if symptoms worsen or persist.  Recheck as scheduled or sooner as needed.  No follow-ups on file.  Eulis Foster, FNP

## 2022-12-16 NOTE — Telephone Encounter (Signed)
Amanda from [facility] contacted the office to notify patient's pcp that patient missed a Physical Therapy visit this past week. They were unable to reach PT.

## 2022-12-16 NOTE — Addendum Note (Signed)
Addended by: Eden Emms on: 12/16/2022 07:59 AM   Modules accepted: Orders

## 2022-12-16 NOTE — Telephone Encounter (Signed)
Called patient husband is going to be able to come in today to see provider in office. Transportation was arranged and she is on the way to office. Will call if any issues. Did not review with family information.

## 2022-12-18 NOTE — Telephone Encounter (Signed)
noted 

## 2022-12-19 ENCOUNTER — Telehealth: Payer: Self-pay | Admitting: Nurse Practitioner

## 2022-12-19 NOTE — Telephone Encounter (Signed)
noted 

## 2022-12-19 NOTE — Telephone Encounter (Signed)
See other encounter where I changed the Cardizem to metoprolol

## 2022-12-19 NOTE — Telephone Encounter (Signed)
I read the providers note. I just need to make sure whether the cardizem to metoprolol change occurred or not?

## 2022-12-19 NOTE — Telephone Encounter (Signed)
Called spoke to husband (on dpr) she had not made the change as requested. I have reviewed what needs to be updated for patient. He has repeated back to me. He will go pick up now and start Metoprolol. He will stop the cardizem.

## 2022-12-19 NOTE — Telephone Encounter (Signed)
Pt's husband, [name], called stating he read the results from [name] from last Fri, 9/20 & followed her instructions on meds & having the pt eat more eating bananas. [name] states the pt's feet are still swollen. Please advise. Call back # [phone]

## 2022-12-20 ENCOUNTER — Telehealth: Payer: Self-pay | Admitting: Nurse Practitioner

## 2022-12-20 DIAGNOSIS — E039 Hypothyroidism, unspecified: Secondary | ICD-10-CM | POA: Diagnosis not present

## 2022-12-20 DIAGNOSIS — I2699 Other pulmonary embolism without acute cor pulmonale: Secondary | ICD-10-CM | POA: Diagnosis not present

## 2022-12-20 DIAGNOSIS — S32020D Wedge compression fracture of second lumbar vertebra, subsequent encounter for fracture with routine healing: Secondary | ICD-10-CM | POA: Diagnosis not present

## 2022-12-20 DIAGNOSIS — E1165 Type 2 diabetes mellitus with hyperglycemia: Secondary | ICD-10-CM | POA: Diagnosis not present

## 2022-12-20 DIAGNOSIS — M48061 Spinal stenosis, lumbar region without neurogenic claudication: Secondary | ICD-10-CM | POA: Diagnosis not present

## 2022-12-20 DIAGNOSIS — K5909 Other constipation: Secondary | ICD-10-CM | POA: Diagnosis not present

## 2022-12-20 DIAGNOSIS — I110 Hypertensive heart disease with heart failure: Secondary | ICD-10-CM | POA: Diagnosis not present

## 2022-12-20 DIAGNOSIS — I5032 Chronic diastolic (congestive) heart failure: Secondary | ICD-10-CM | POA: Diagnosis not present

## 2022-12-20 DIAGNOSIS — I82401 Acute embolism and thrombosis of unspecified deep veins of right lower extremity: Secondary | ICD-10-CM | POA: Diagnosis not present

## 2022-12-20 NOTE — Telephone Encounter (Signed)
I spoke with Stacy Kirk and can we get the patient moved up to this Friday vs next Friday with me if possible

## 2022-12-20 NOTE — Telephone Encounter (Signed)
Contacted pt and rescheduled appointment for this Friday instead of next Friday.

## 2022-12-21 ENCOUNTER — Other Ambulatory Visit: Payer: Self-pay | Admitting: Nurse Practitioner

## 2022-12-21 DIAGNOSIS — I110 Hypertensive heart disease with heart failure: Secondary | ICD-10-CM | POA: Diagnosis not present

## 2022-12-21 DIAGNOSIS — E039 Hypothyroidism, unspecified: Secondary | ICD-10-CM | POA: Diagnosis not present

## 2022-12-21 DIAGNOSIS — I5032 Chronic diastolic (congestive) heart failure: Secondary | ICD-10-CM | POA: Diagnosis not present

## 2022-12-21 DIAGNOSIS — M48061 Spinal stenosis, lumbar region without neurogenic claudication: Secondary | ICD-10-CM | POA: Diagnosis not present

## 2022-12-21 DIAGNOSIS — S32020A Wedge compression fracture of second lumbar vertebra, initial encounter for closed fracture: Secondary | ICD-10-CM

## 2022-12-21 DIAGNOSIS — E1165 Type 2 diabetes mellitus with hyperglycemia: Secondary | ICD-10-CM | POA: Diagnosis not present

## 2022-12-21 DIAGNOSIS — K5909 Other constipation: Secondary | ICD-10-CM | POA: Diagnosis not present

## 2022-12-21 DIAGNOSIS — I2699 Other pulmonary embolism without acute cor pulmonale: Secondary | ICD-10-CM | POA: Diagnosis not present

## 2022-12-21 DIAGNOSIS — I82401 Acute embolism and thrombosis of unspecified deep veins of right lower extremity: Secondary | ICD-10-CM | POA: Diagnosis not present

## 2022-12-21 DIAGNOSIS — S32020D Wedge compression fracture of second lumbar vertebra, subsequent encounter for fracture with routine healing: Secondary | ICD-10-CM | POA: Diagnosis not present

## 2022-12-22 DIAGNOSIS — I2699 Other pulmonary embolism without acute cor pulmonale: Secondary | ICD-10-CM | POA: Diagnosis not present

## 2022-12-22 DIAGNOSIS — E039 Hypothyroidism, unspecified: Secondary | ICD-10-CM | POA: Diagnosis not present

## 2022-12-22 DIAGNOSIS — I5032 Chronic diastolic (congestive) heart failure: Secondary | ICD-10-CM | POA: Diagnosis not present

## 2022-12-22 DIAGNOSIS — K5909 Other constipation: Secondary | ICD-10-CM | POA: Diagnosis not present

## 2022-12-22 DIAGNOSIS — I82401 Acute embolism and thrombosis of unspecified deep veins of right lower extremity: Secondary | ICD-10-CM | POA: Diagnosis not present

## 2022-12-22 DIAGNOSIS — E1165 Type 2 diabetes mellitus with hyperglycemia: Secondary | ICD-10-CM | POA: Diagnosis not present

## 2022-12-22 DIAGNOSIS — I110 Hypertensive heart disease with heart failure: Secondary | ICD-10-CM | POA: Diagnosis not present

## 2022-12-22 DIAGNOSIS — S32020D Wedge compression fracture of second lumbar vertebra, subsequent encounter for fracture with routine healing: Secondary | ICD-10-CM | POA: Diagnosis not present

## 2022-12-22 DIAGNOSIS — M48061 Spinal stenosis, lumbar region without neurogenic claudication: Secondary | ICD-10-CM | POA: Diagnosis not present

## 2022-12-23 ENCOUNTER — Ambulatory Visit: Payer: Medicare HMO | Admitting: Nurse Practitioner

## 2022-12-27 ENCOUNTER — Encounter: Payer: Self-pay | Admitting: Nurse Practitioner

## 2022-12-27 ENCOUNTER — Ambulatory Visit (INDEPENDENT_AMBULATORY_CARE_PROVIDER_SITE_OTHER): Payer: Medicare HMO | Admitting: Nurse Practitioner

## 2022-12-27 VITALS — BP 122/76 | HR 106 | Temp 98.1°F | Ht 60.98 in | Wt 184.0 lb

## 2022-12-27 DIAGNOSIS — R600 Localized edema: Secondary | ICD-10-CM

## 2022-12-27 DIAGNOSIS — M79673 Pain in unspecified foot: Secondary | ICD-10-CM | POA: Diagnosis not present

## 2022-12-27 DIAGNOSIS — R609 Edema, unspecified: Secondary | ICD-10-CM | POA: Diagnosis not present

## 2022-12-27 DIAGNOSIS — Z743 Need for continuous supervision: Secondary | ICD-10-CM | POA: Diagnosis not present

## 2022-12-27 LAB — BASIC METABOLIC PANEL
BUN: 23 mg/dL (ref 6–23)
CO2: 40 meq/L — ABNORMAL HIGH (ref 19–32)
Calcium: 9.5 mg/dL (ref 8.4–10.5)
Chloride: 88 meq/L — ABNORMAL LOW (ref 96–112)
Creatinine, Ser: 1.08 mg/dL (ref 0.40–1.20)
GFR: 48.84 mL/min — ABNORMAL LOW (ref >60.00)
Glucose, Bld: 165 mg/dL — ABNORMAL HIGH (ref 70–99)
Potassium: 3.1 meq/L — ABNORMAL LOW (ref 3.5–5.1)
Sodium: 137 meq/L (ref 135–145)

## 2022-12-27 LAB — CBC
HCT: 39.4 % (ref 36.0–46.0)
Hemoglobin: 12.7 g/dL (ref 12.0–15.0)
MCHC: 32.1 g/dL (ref 30.0–36.0)
MCV: 95 fL (ref 78.0–100.0)
Platelets: 302 10*3/uL (ref 150.0–400.0)
RBC: 4.15 Mil/uL (ref 3.87–5.11)
RDW: 13.8 % (ref 11.5–15.5)
WBC: 9.3 10*3/uL (ref 4.0–10.5)

## 2022-12-27 NOTE — Patient Instructions (Signed)
Nice to see you toyda I will be in touch with the labs once I have them Follow up with me in approx 1 month for a recheck and review of MRI

## 2022-12-27 NOTE — Progress Notes (Signed)
Established Patient Office Visit  Subjective   Patient ID: Stacy Kirk, female    DOB: [birth year]  Age: 77 y.o. MRN: [id number]  Chief Complaint  Patient presents with   Follow-up    Pt complains of ongoing feet swelling. Has gone down a little last week. Pt complains of stomach cramps this morning after taking medication.     HPI   Edema: patient has been seen several times with complaints of  lower extremity edema. She was started on lasix and then titrated up to 80mg  a day. I had also switched her from the diltazem to metoprolol. She is here for a follow up. States that hse is elevating her legs at home and she is still having swelling. States that she is not having no pain. States no numbness or tingling in the feet. States that she will move her feet and they will get numb but when she stops they feel normal.  Of note patient is scheduled to get MRI of her lower back.  We have an open MRI as patient is claustrophobic.  Patient is still being fairly sedentary at home she is able to get up on a walker and pivot to bedside to potty.  Patient is concerned because her mother ended up with lower extremity edema with ulcers breaking out eventually   Review of Systems  Constitutional:  Negative for chills and fever.  Respiratory:  Negative for shortness of breath.   Cardiovascular:  Positive for leg swelling. Negative for chest pain.  Neurological:  Positive for tingling. Negative for weakness and headaches.      Objective:     BP 122/76   Pulse (!) 106   Temp 98.1 F (36.7 C) (Oral)   Ht 5' 0.98" (1.549 m)   Wt 184 lb (83.5 kg) Comment: per chart  SpO2 92%   BMI 34.79 kg/m  BP Readings from Last 3 Encounters:  12/27/22 122/76  12/16/22 132/64  12/09/22 132/84   Wt Readings from Last 3 Encounters:  12/27/22 184 lb (83.5 kg)  12/16/22 184 lb (83.5 kg)  11/22/22 179 lb 14.3 oz (81.6 kg)      Physical Exam Vitals and nursing note reviewed.  Constitutional:       Appearance: Normal appearance.  Cardiovascular:     Rate and Rhythm: Regular rhythm. Tachycardia present.     Pulses:          Dorsalis pedis pulses are 2+ on the right side and 2+ on the left side.     Heart sounds: Normal heart sounds.  Pulmonary:     Effort: Pulmonary effort is normal.     Breath sounds: Normal breath sounds.  Abdominal:     General: Bowel sounds are normal. There is no distension.     Palpations: There is no mass.     Tenderness: There is no abdominal tenderness.     Hernia: No hernia is present.  Musculoskeletal:     Right lower leg: 2+ Edema present.     Left lower leg: 2+ Edema present.  Neurological:     Mental Status: She is alert.      No results found for any visits on 12/27/22.    The ASCVD Risk score (Arnett DK, et al., 2019) failed to calculate for the following reasons:   Cannot find a previous HDL lab   Cannot find a previous total cholesterol lab    Assessment & Plan:   Problem List Items Addressed This Visit  Other   Bilateral lower extremity edema - Primary    Patient has been seen many times for lower extremity edema was started on Lasix 20 mg and bumped up to 40 mg.  No great effect with diuretic use.  Patient was switched from calcium channel blocker to beta-blocker without great reduction in her lower extremity edema.  Since lower extremity edema is resistant to traditional medical therapy likely lymphedema and ambulatory referral to vascular surgery placed today      Relevant Orders   Ambulatory referral to Vascular Surgery   Basic metabolic panel   CBC    Return in about 4 weeks (around 01/24/2023) for MRI review/lower extremity edema.    Audria Nine, NP

## 2022-12-27 NOTE — Assessment & Plan Note (Signed)
Patient has been seen many times for lower extremity edema was started on Lasix 20 mg and bumped up to 40 mg.  No great effect with diuretic use.  Patient was switched from calcium channel blocker to beta-blocker without great reduction in her lower extremity edema.  Since lower extremity edema is resistant to traditional medical therapy likely lymphedema and ambulatory referral to vascular surgery placed today

## 2022-12-29 ENCOUNTER — Other Ambulatory Visit: Payer: Self-pay | Admitting: Nurse Practitioner

## 2022-12-29 DIAGNOSIS — E1165 Type 2 diabetes mellitus with hyperglycemia: Secondary | ICD-10-CM | POA: Diagnosis not present

## 2022-12-29 DIAGNOSIS — E876 Hypokalemia: Secondary | ICD-10-CM

## 2022-12-29 DIAGNOSIS — E039 Hypothyroidism, unspecified: Secondary | ICD-10-CM | POA: Diagnosis not present

## 2022-12-29 DIAGNOSIS — I2699 Other pulmonary embolism without acute cor pulmonale: Secondary | ICD-10-CM | POA: Diagnosis not present

## 2022-12-29 DIAGNOSIS — I82401 Acute embolism and thrombosis of unspecified deep veins of right lower extremity: Secondary | ICD-10-CM | POA: Diagnosis not present

## 2022-12-29 DIAGNOSIS — I5032 Chronic diastolic (congestive) heart failure: Secondary | ICD-10-CM | POA: Diagnosis not present

## 2022-12-29 DIAGNOSIS — M48061 Spinal stenosis, lumbar region without neurogenic claudication: Secondary | ICD-10-CM | POA: Diagnosis not present

## 2022-12-29 DIAGNOSIS — S32020D Wedge compression fracture of second lumbar vertebra, subsequent encounter for fracture with routine healing: Secondary | ICD-10-CM | POA: Diagnosis not present

## 2022-12-29 DIAGNOSIS — I110 Hypertensive heart disease with heart failure: Secondary | ICD-10-CM | POA: Diagnosis not present

## 2022-12-29 DIAGNOSIS — K5909 Other constipation: Secondary | ICD-10-CM | POA: Diagnosis not present

## 2022-12-29 MED ORDER — POTASSIUM CHLORIDE CRYS ER 20 MEQ PO TBCR: 40.0000 meq | EXTENDED_RELEASE_TABLET | Freq: Every day | ORAL | 0 refills | Status: DC

## 2022-12-30 ENCOUNTER — Ambulatory Visit: Payer: Medicare HMO | Admitting: Nurse Practitioner

## 2022-12-30 ENCOUNTER — Other Ambulatory Visit: Payer: Self-pay | Admitting: Nurse Practitioner

## 2022-12-30 DIAGNOSIS — S32020A Wedge compression fracture of second lumbar vertebra, initial encounter for closed fracture: Secondary | ICD-10-CM

## 2023-01-02 ENCOUNTER — Telehealth: Payer: Self-pay | Admitting: Nurse Practitioner

## 2023-01-02 NOTE — Telephone Encounter (Signed)
-----   Message from [facility] T sent at 12/30/2022  4:52 PM EDT ----- Called patient reviewed all information and repeated back to me. Pt complains that swelling in feet has not gone down and she would like to know why. "They look like balloons" . States that everyone says its gone down but she feels like it has not. Pt states that Home health care is able to run labs just need an order or "prescription"? placed.

## 2023-01-02 NOTE — Telephone Encounter (Signed)
Contacted Home health nurse to give health orders with dx of hypokalemia. Also relayed information regarding referral to vascular for the swelling. [facility] nurse stated that they plan to draw blood from home on 10/10.

## 2023-01-02 NOTE — Telephone Encounter (Signed)
We can give home health the order for a BMP with dx of hypokalemia to be drawn later this week when they see the patient. I have referred the patient to vascular in order to figure out her swelling issue

## 2023-01-03 ENCOUNTER — Telehealth: Payer: Self-pay | Admitting: Nurse Practitioner

## 2023-01-03 NOTE — Telephone Encounter (Signed)
Prescription Request  01/03/2023  LOV: 12/27/2022  What is the name of the medication or equipment? losartan (COZAAR) 25 MG tablet (Expired)   Have you contacted your pharmacy to request a refill? No   Which pharmacy would you like this sent to?  [id number],  - [address] [place] [address] Phone: [phone] Fax: [phone]    Patient notified that their request is being sent to the clinical staff for review and that they should receive a response within 2 business days.   Please advise at Mobile There is no such number on file (mobile).

## 2023-01-03 NOTE — Telephone Encounter (Signed)
noted 

## 2023-01-03 NOTE — Telephone Encounter (Signed)
LAST APPOINTMENT DATE: 12/27/2022   NEXT APPOINTMENT DATE: Visit date not found   Losartan 25mg   LAST REFILL: 10/10/22  QTY: #30 0RF

## 2023-01-04 DIAGNOSIS — S32020D Wedge compression fracture of second lumbar vertebra, subsequent encounter for fracture with routine healing: Secondary | ICD-10-CM | POA: Diagnosis not present

## 2023-01-04 DIAGNOSIS — K5909 Other constipation: Secondary | ICD-10-CM | POA: Diagnosis not present

## 2023-01-04 DIAGNOSIS — I82401 Acute embolism and thrombosis of unspecified deep veins of right lower extremity: Secondary | ICD-10-CM | POA: Diagnosis not present

## 2023-01-04 DIAGNOSIS — E039 Hypothyroidism, unspecified: Secondary | ICD-10-CM | POA: Diagnosis not present

## 2023-01-04 DIAGNOSIS — M48061 Spinal stenosis, lumbar region without neurogenic claudication: Secondary | ICD-10-CM | POA: Diagnosis not present

## 2023-01-04 DIAGNOSIS — I110 Hypertensive heart disease with heart failure: Secondary | ICD-10-CM | POA: Diagnosis not present

## 2023-01-04 DIAGNOSIS — I2699 Other pulmonary embolism without acute cor pulmonale: Secondary | ICD-10-CM | POA: Diagnosis not present

## 2023-01-04 DIAGNOSIS — I5032 Chronic diastolic (congestive) heart failure: Secondary | ICD-10-CM | POA: Diagnosis not present

## 2023-01-04 DIAGNOSIS — E1165 Type 2 diabetes mellitus with hyperglycemia: Secondary | ICD-10-CM | POA: Diagnosis not present

## 2023-01-04 MED ORDER — LOSARTAN POTASSIUM 25 MG PO TABS: 25.0000 mg | ORAL_TABLET | Freq: Every day | ORAL | 1 refills | Status: DC

## 2023-01-04 NOTE — Telephone Encounter (Signed)
Refill provided

## 2023-01-05 DIAGNOSIS — M48061 Spinal stenosis, lumbar region without neurogenic claudication: Secondary | ICD-10-CM | POA: Diagnosis not present

## 2023-01-05 DIAGNOSIS — K5909 Other constipation: Secondary | ICD-10-CM | POA: Diagnosis not present

## 2023-01-05 DIAGNOSIS — I2699 Other pulmonary embolism without acute cor pulmonale: Secondary | ICD-10-CM | POA: Diagnosis not present

## 2023-01-05 DIAGNOSIS — S32020D Wedge compression fracture of second lumbar vertebra, subsequent encounter for fracture with routine healing: Secondary | ICD-10-CM | POA: Diagnosis not present

## 2023-01-05 DIAGNOSIS — E1165 Type 2 diabetes mellitus with hyperglycemia: Secondary | ICD-10-CM | POA: Diagnosis not present

## 2023-01-05 DIAGNOSIS — I110 Hypertensive heart disease with heart failure: Secondary | ICD-10-CM | POA: Diagnosis not present

## 2023-01-05 DIAGNOSIS — I82401 Acute embolism and thrombosis of unspecified deep veins of right lower extremity: Secondary | ICD-10-CM | POA: Diagnosis not present

## 2023-01-05 DIAGNOSIS — I5032 Chronic diastolic (congestive) heart failure: Secondary | ICD-10-CM | POA: Diagnosis not present

## 2023-01-05 DIAGNOSIS — E876 Hypokalemia: Secondary | ICD-10-CM | POA: Diagnosis not present

## 2023-01-05 DIAGNOSIS — E039 Hypothyroidism, unspecified: Secondary | ICD-10-CM | POA: Diagnosis not present

## 2023-01-06 ENCOUNTER — Telehealth: Payer: Self-pay | Admitting: Nurse Practitioner

## 2023-01-06 LAB — LAB REPORT - SCANNED: EGFR: 60

## 2023-01-06 NOTE — Telephone Encounter (Signed)
Patient's husband called and stated he had [facility], says he was confused as to what to do with this medication? He was not sure if patient was prescribed this by [facility] or not. I did inform husband that med is listed as discontinued on patient's medication list and that I would ask if she should be taking this. Please advise, thank you

## 2023-01-07 ENCOUNTER — Ambulatory Visit
Admission: RE | Admit: 2023-01-07 | Discharge: 2023-01-07 | Disposition: A | Discharge status: Home / Self Care | Payer: Medicare HMO | Source: Ambulatory Visit | Attending: Nurse Practitioner | Admitting: Nurse Practitioner

## 2023-01-07 DIAGNOSIS — M4316 Spondylolisthesis, lumbar region: Secondary | ICD-10-CM | POA: Diagnosis not present

## 2023-01-07 DIAGNOSIS — R29898 Other symptoms and signs involving the musculoskeletal system: Secondary | ICD-10-CM

## 2023-01-07 DIAGNOSIS — M4856XA Collapsed vertebra, not elsewhere classified, lumbar region, initial encounter for fracture: Secondary | ICD-10-CM | POA: Diagnosis not present

## 2023-01-07 DIAGNOSIS — M545 Low back pain, unspecified: Secondary | ICD-10-CM | POA: Diagnosis not present

## 2023-01-09 ENCOUNTER — Other Ambulatory Visit: Payer: Self-pay | Admitting: Nurse Practitioner

## 2023-01-09 MED ORDER — APIXABAN 5 MG PO TABS: 5.0000 mg | ORAL_TABLET | Freq: Two times a day (BID) | ORAL | 3 refills | Status: DC

## 2023-01-09 NOTE — Telephone Encounter (Signed)
Last seen in our office 12/27/22. Was not given by our office in past. Ok to refill?

## 2023-01-09 NOTE — Telephone Encounter (Signed)
Stacy Kirk came to my office about the potassium prescription and Cardizem prescription.  I did inform her for the patient to take 40 mEq of potassium x 1 dose.  We also discontinued the Cardizem and it was replaced with metoprolol so she does not need to take the Cardizem at all

## 2023-01-09 NOTE — Telephone Encounter (Signed)
Refill was sent to pharmacy.

## 2023-01-09 NOTE — Telephone Encounter (Signed)
Contacted pt. Spoke with pt's husband [name] [place]. Informed [name] to have the pt stop cardizem and replace with metoprolol. [name] understood and verbalized understanding. Lindi Adie to have pt take potassium 40 meq x1 dose daily. He verbalized understanding.  [name] states that he will not continue to add cardizem to medication list.   [name] also states that pt is on last pill of Eliquis and is need of a refill.    LAST APPOINTMENT DATE: 12/27/22   NEXT APPOINTMENT DATE: None   Eliquis 5mg    LAST REFILL: 10/10/22  QTY: #68 0RF

## 2023-01-09 NOTE — Telephone Encounter (Signed)
Patient husband called in to follow up on this. He stated that he hasn't heard anything about this. Please advise. Thank you!

## 2023-01-09 NOTE — Telephone Encounter (Signed)
Prescription Request  01/09/2023  LOV: 12/27/2022  What is the name of the medication or equipment? apixaban (ELIQUIS) 5 MG TABS tablet  Have you contacted your pharmacy to request a refill? Yes   Which pharmacy would you like this sent to?  [id number], [place] - [address] [place] [address] Phone: [phone] Fax: [phone]    Patient notified that their request is being sent to the clinical staff for review and that they should receive a response within 2 business days.   Please advise at [facility] [phone];

## 2023-01-10 ENCOUNTER — Other Ambulatory Visit: Payer: Self-pay | Admitting: Nurse Practitioner

## 2023-01-10 DIAGNOSIS — S32020D Wedge compression fracture of second lumbar vertebra, subsequent encounter for fracture with routine healing: Secondary | ICD-10-CM | POA: Diagnosis not present

## 2023-01-10 DIAGNOSIS — E039 Hypothyroidism, unspecified: Secondary | ICD-10-CM | POA: Diagnosis not present

## 2023-01-10 DIAGNOSIS — I110 Hypertensive heart disease with heart failure: Secondary | ICD-10-CM | POA: Diagnosis not present

## 2023-01-10 DIAGNOSIS — E1165 Type 2 diabetes mellitus with hyperglycemia: Secondary | ICD-10-CM | POA: Diagnosis not present

## 2023-01-10 DIAGNOSIS — K5909 Other constipation: Secondary | ICD-10-CM | POA: Diagnosis not present

## 2023-01-10 DIAGNOSIS — I82401 Acute embolism and thrombosis of unspecified deep veins of right lower extremity: Secondary | ICD-10-CM | POA: Diagnosis not present

## 2023-01-10 DIAGNOSIS — M48061 Spinal stenosis, lumbar region without neurogenic claudication: Secondary | ICD-10-CM | POA: Diagnosis not present

## 2023-01-10 DIAGNOSIS — I2699 Other pulmonary embolism without acute cor pulmonale: Secondary | ICD-10-CM | POA: Diagnosis not present

## 2023-01-10 DIAGNOSIS — I5032 Chronic diastolic (congestive) heart failure: Secondary | ICD-10-CM | POA: Diagnosis not present

## 2023-01-10 MED ORDER — APIXABAN 5 MG PO TABS: 5.0000 mg | ORAL_TABLET | Freq: Two times a day (BID) | ORAL | 3 refills | Status: DC

## 2023-01-10 NOTE — Telephone Encounter (Signed)
Patient husband called in and stated that the pharmacy hasn't received the refill yet. He stated that she has been out since yesterday. Please advise. Thank you!

## 2023-01-10 NOTE — Telephone Encounter (Signed)
Contacted Pharmacy. Pharmacy staff is stating that they have not received the order and would need to be resent.

## 2023-01-10 NOTE — Addendum Note (Signed)
Addended by: Eden Emms on: 01/10/2023 05:12 PM   Modules accepted: Orders

## 2023-01-10 NOTE — Telephone Encounter (Signed)
Prescription resent

## 2023-01-14 DIAGNOSIS — I5032 Chronic diastolic (congestive) heart failure: Secondary | ICD-10-CM | POA: Diagnosis not present

## 2023-01-14 DIAGNOSIS — M48061 Spinal stenosis, lumbar region without neurogenic claudication: Secondary | ICD-10-CM | POA: Diagnosis not present

## 2023-01-14 DIAGNOSIS — I2699 Other pulmonary embolism without acute cor pulmonale: Secondary | ICD-10-CM | POA: Diagnosis not present

## 2023-01-14 DIAGNOSIS — E039 Hypothyroidism, unspecified: Secondary | ICD-10-CM | POA: Diagnosis not present

## 2023-01-14 DIAGNOSIS — S32020D Wedge compression fracture of second lumbar vertebra, subsequent encounter for fracture with routine healing: Secondary | ICD-10-CM | POA: Diagnosis not present

## 2023-01-14 DIAGNOSIS — E1165 Type 2 diabetes mellitus with hyperglycemia: Secondary | ICD-10-CM | POA: Diagnosis not present

## 2023-01-14 DIAGNOSIS — K5909 Other constipation: Secondary | ICD-10-CM | POA: Diagnosis not present

## 2023-01-14 DIAGNOSIS — I110 Hypertensive heart disease with heart failure: Secondary | ICD-10-CM | POA: Diagnosis not present

## 2023-01-14 DIAGNOSIS — I82401 Acute embolism and thrombosis of unspecified deep veins of right lower extremity: Secondary | ICD-10-CM | POA: Diagnosis not present

## 2023-01-17 ENCOUNTER — Encounter (INDEPENDENT_AMBULATORY_CARE_PROVIDER_SITE_OTHER): Payer: Self-pay | Admitting: Nurse Practitioner

## 2023-01-17 ENCOUNTER — Telehealth: Payer: Self-pay | Admitting: Nurse Practitioner

## 2023-01-17 ENCOUNTER — Ambulatory Visit (INDEPENDENT_AMBULATORY_CARE_PROVIDER_SITE_OTHER): Payer: Medicare HMO | Admitting: Nurse Practitioner

## 2023-01-17 VITALS — BP 113/66 | HR 55 | Resp 16

## 2023-01-17 DIAGNOSIS — M7989 Other specified soft tissue disorders: Secondary | ICD-10-CM | POA: Diagnosis not present

## 2023-01-17 DIAGNOSIS — E118 Type 2 diabetes mellitus with unspecified complications: Secondary | ICD-10-CM | POA: Diagnosis not present

## 2023-01-17 DIAGNOSIS — I10 Essential (primary) hypertension: Secondary | ICD-10-CM | POA: Diagnosis not present

## 2023-01-17 NOTE — Telephone Encounter (Signed)
Home Health verbal orders Caller Name:Stephanie Agency Name: [facility] number: [phone]  Requesting OT/PT/Skilled nursing/Social Work/Speech:  Skilled nursing  Reason:Heart failure teaching  Frequency: Every other week   Please forward to [facility] pool or providers CMA

## 2023-01-17 NOTE — Telephone Encounter (Signed)
Verbal orders approved.

## 2023-01-18 NOTE — Telephone Encounter (Signed)
Spoke with HH to inform of verbal orders being approved. Caller understood and verbalized understanding.

## 2023-01-28 ENCOUNTER — Other Ambulatory Visit: Payer: Self-pay | Admitting: Nurse Practitioner

## 2023-01-28 DIAGNOSIS — S32020A Wedge compression fracture of second lumbar vertebra, initial encounter for closed fracture: Secondary | ICD-10-CM

## 2023-01-30 ENCOUNTER — Encounter (INDEPENDENT_AMBULATORY_CARE_PROVIDER_SITE_OTHER): Payer: Self-pay | Admitting: Nurse Practitioner

## 2023-01-30 NOTE — Progress Notes (Signed)
Subjective:    Patient ID: Stacy Kirk, female    DOB: 06-Jan-1946, 77 y.o.   MRN: [id number] Chief Complaint  Patient presents with   New Patient (Initial Visit)    Ref Toney Reil consult ble edema     Patient is seen for evaluation of leg swelling. The patient first noticed the swelling remotely but is now concerned because of a significant increase in the overall edema. The swelling isn't associated with significant pain.  There has been an increasing amount of  discoloration noted by the patient. The patient notes that in the morning the legs are improved but they steadily worsened throughout the course of the day. Elevation seems to make the swelling of the legs better, dependency makes them much worse.   There is no history of ulcerations associated with the swelling.   The patient denies any recent changes in their medications.  The patient has not been wearing graduated compression.  The patient has no had any past angiography, interventions or vascular surgery.  The patient denies a history of DVT or PE. There is no prior history of phlebitis. There is no history of primary lymphedema.  There is no history of radiation treatment to the groin or pelvis No history of malignancies. No history of trauma or groin or pelvic surgery. No history of foreign travel or parasitic infections area      Review of Systems  Cardiovascular:  Positive for leg swelling.  All other systems reviewed and are negative.      Objective:   Physical Exam Vitals reviewed.  HENT:     Head: Normocephalic.  Cardiovascular:     Rate and Rhythm: Normal rate.  Pulmonary:     Effort: Pulmonary effort is normal.  Musculoskeletal:     Right lower leg: Edema present.     Left lower leg: Edema present.  Neurological:     Mental Status: She is alert and oriented to person, place, and time.  Psychiatric:        Mood and Affect: Mood normal.        Behavior: Behavior normal.        Thought  Content: Thought content normal.        Judgment: Judgment normal.     BP 113/66 (BP Location: Left Arm)   Pulse (!) 55   Resp 16   Past Medical History:  Diagnosis Date   Arthritis    Cancer (HCC)    ovarian, skin face- skin cancer   Complication of anesthesia    slow to awaken everday, even slower with anesthesia   Constipation    Diabetes mellitus    Type II   GERD (gastroesophageal reflux disease)    Headache 05/12/2015   "after a fall" headache for 8 months   Hemorrhoids    Hernia    History of kidney stones    passed   Hypertension    Hypothyroidism    Loss of appetite    Muscle spasm    back   Osteoporosis    Redness    abd wound   Urine frequency    unable to hold urine    Wears hearing aid    bilateral     Social History   Socioeconomic History   Marital status: Married    Spouse name: Not on file   Number of children: 2   Years of education: Not on file   Highest education level: Not on file  Occupational History  Not on file  Tobacco Use   Smoking status: Former    Current packs/day: 0.00    Types: Cigarettes    Start date: 03/29/1983    Quit date: 03/28/1998    Years since quitting: 24.8   Smokeless tobacco: Never   Tobacco comments:    quit 2002  Vaping Use   Vaping status: Never Used  Substance and Sexual Activity   Alcohol use: No   Drug use: No   Sexual activity: Not on file  Other Topics Concern   Not on file  Social History Narrative   Retired      International aid/development worker of [employer] investment [employer] Strain: Not on file  Food Insecurity: No Food Insecurity (11/22/2022)   Hunger Vital Sign    Worried About Running Out of Food in the Last Year: Never true    Ran Out of Food in the Last Year: Never true  Transportation Needs: No Transportation Needs (11/22/2022)   PRAPARE - Administrator, Civil Service (Medical): No    Lack of Transportation (Non-Medical): No  Physical Activity: Not on file  Stress: Not on file   Social Connections: Not on file  Intimate Partner Violence: Not At Risk (11/22/2022)   Humiliation, Afraid, Rape, and Kick questionnaire    Fear of Current or Ex-Partner: No    Emotionally Abused: No    Physically Abused: No    Sexually Abused: No    Past Surgical History:  Procedure Laterality Date   ABDOMINAL HYSTERECTOMY     APPENDECTOMY     CHOLECYSTECTOMY     COLONOSCOPY W/ POLYPECTOMY     HERNIA REPAIR  Jun2012   open component separation w biologic mesh (Dr. Luisa Hart)   TOE SURGERY Left    2nd toe   TONSILLECTOMY      Family History  Problem Relation Age of Onset   Diabetes Mother    Early death Sister    Drug abuse Sister     Allergies  Allergen Reactions   Erythromycin Diarrhea and Other (See Comments)    "Tears" her stomach up. Major digestive upset.   Tramadol Shortness Of Breath and Other (See Comments)    Felt like lungs filled up; unable to lay down   Albumin (Human) Other (See Comments)    Pt unsure of reaction   Aspirin Itching and Other (See Comments)    Caused lethargy, also   Codeine Itching and Other (See Comments)    Cause lethargy, also   Cortisone Other (See Comments)    NO STEROID INJECTIONS; headaches   Linzess [Linaclotide] Diarrhea   Propoxyphene Other (See Comments)    Terrible reaction; headaches   Simvastatin Itching and Other (See Comments)    Severe muscle aches   Triamcinolone Acetonide Other (See Comments)    headache       Latest Ref Rng & Units 12/27/2022   11:41 AM 11/29/2022   10:28 AM 11/23/2022    2:43 AM  CBC  WBC 4.0 - 10.5 K/uL 9.3  8.8  7.5   Hemoglobin 12.0 - 15.0 g/dL 16.1  09.6  04.5   Hematocrit 36.0 - 46.0 % 39.4  39.0  37.3   Platelets 150.0 - 400.0 K/uL 302.0  301.0  201       CMP     Component Value Date/Time   NA 137 12/27/2022 1141   K 3.1 (L) 12/27/2022 1141   CL 88 (L) 12/27/2022 1141   CO2 40 (H) 12/27/2022  1141   GLUCOSE 165 (H) 12/27/2022 1141   BUN 23 12/27/2022 1141   CREATININE 1.08  12/27/2022 1141   CREATININE 1.19 (H) 08/05/2022 1512   CALCIUM 9.5 12/27/2022 1141   PROT 6.0 12/16/2022 1130   ALBUMIN 3.5 12/16/2022 1130   AST 13 12/16/2022 1130   ALT 11 12/16/2022 1130   ALKPHOS 39 12/16/2022 1130   BILITOT 0.9 12/16/2022 1130   GFR 48.84 (L) 12/27/2022 1141   EGFR 60.0 01/06/2023 0919   GFRNONAA >60 11/23/2022 0243     No results found.     Assessment & Plan:   1. Leg swelling Recommend:  I have had a long discussion with the patient regarding swelling and why it  causes symptoms.  Patient will begin wearing graduated compression on a daily basis a prescription was given. The patient will  wear the stockings first thing in the morning and removing them in the evening. The patient is instructed specifically not to sleep in the stockings.   In addition, behavioral modification will be initiated.  This will include frequent elevation, use of over the counter pain medications and exercise such as walking.  Consideration for a lymph pump will also be made based upon the effectiveness of conservative therapy.  This would help to improve the edema control and prevent sequela such as ulcers and infections   Patient should undergo duplex ultrasound of the venous system to ensure that DVT or reflux is not present.  The patient will follow-up with me after the ultrasound.   2. Hypertension, unspecified type Continue antihypertensive medications as already ordered, these medications have been reviewed and there are no changes at this time.  3. Controlled type 2 diabetes mellitus with complication, without long-term current use of insulin (HCC) Continue hypoglycemic medications as already ordered, these medications have been reviewed and there are no changes at this time.  Hgb A1C to be monitored as already arranged by primary service   Current Outpatient Medications on File Prior to Visit  Medication Sig Dispense Refill   acetaminophen (TYLENOL) 500 MG tablet  Take 1,000 mg by mouth every 6 (six) hours as needed (FOR PAIN.).     apixaban (ELIQUIS) 5 MG TABS tablet Take 1 tablet (5 mg total) by mouth 2 (two) times daily. 60 tablet 3   Baclofen 5 MG TABS TAKE 1 TABLET BY MOUTH 3 TIMES DAILY AS NEEDED. 90 tablet 0   furosemide (LASIX) 80 MG tablet Take 1 tablet (80 mg total) by mouth daily. 30 tablet 3   levothyroxine (SYNTHROID) 100 MCG tablet TAKE 1 TABLET BY MOUTH EVERY DAY 90 tablet 1   losartan (COZAAR) 25 MG tablet Take 1 tablet (25 mg total) by mouth daily. 90 tablet 1   metFORMIN (GLUCOPHAGE) 500 MG tablet Take 1 tablet (500 mg total) by mouth daily after supper. (Patient taking differently: Take 500 mg by mouth every evening.) 90 tablet 1   metoprolol succinate (TOPROL-XL) 25 MG 24 hr tablet Take 1 tablet (25 mg total) by mouth daily. 90 tablet 0   pantoprazole (PROTONIX) 40 MG tablet Take 1 tablet (40 mg total) by mouth daily. 30 tablet 1   predniSONE (DELTASONE) 10 MG tablet Take 1 tablet (10 mg total) by mouth daily with breakfast. 15 tablet 0   docusate sodium (COLACE) 100 MG capsule Take 100 mg by mouth at bedtime. (Patient not taking: Reported on 12/16/2022)     potassium chloride SA (KLOR-CON M) 20 MEQ tablet Take 2 tablets (40 mEq  total) by mouth daily for 2 days. 4 tablet 0   No current facility-administered medications on file prior to visit.    There are no Patient Instructions on file for this visit. No follow-ups on file.   Georgiana Spinner, NP

## 2023-01-31 ENCOUNTER — Other Ambulatory Visit: Payer: Self-pay | Admitting: Internal Medicine

## 2023-01-31 ENCOUNTER — Telehealth: Payer: Self-pay | Admitting: Nurse Practitioner

## 2023-01-31 ENCOUNTER — Other Ambulatory Visit: Payer: Self-pay | Admitting: Nurse Practitioner

## 2023-01-31 NOTE — Telephone Encounter (Signed)
Contacted HH. Spoke with Annabelle Harman from [place]. Gave verbal orders per PCP. No further action needed.

## 2023-01-31 NOTE — Telephone Encounter (Signed)
Home Health verbal orders Caller Name:Dana Agency Name: [facility]  Callback number:[phone]   Requesting OT and Skilled nursing   Reason:order to go out and evalute  Frequency:  Please forward to [facility] pool or providers CMA

## 2023-01-31 NOTE — Telephone Encounter (Signed)
Verbal orders approved.

## 2023-02-02 ENCOUNTER — Telehealth: Payer: Self-pay

## 2023-02-02 NOTE — Telephone Encounter (Signed)
Faxed and made copies of [facility] Care form.

## 2023-02-02 NOTE — Telephone Encounter (Signed)
Called about MRI results and patient requested follow up on refill of following medication  Metoprolol  Lasix Pantoprazole

## 2023-02-03 ENCOUNTER — Telehealth: Payer: Self-pay | Admitting: Nurse Practitioner

## 2023-02-03 NOTE — Telephone Encounter (Signed)
-----   Message from [facility] T sent at 02/02/2023  3:42 PM EST ----- Called and spoke to husband and wife. Reviewed results. States that when she stands for long time she has pain in back and hip. Husband feels she is doing good getting around with walker. They did state that they were told they were told that they were getting referral to cardiology but they haven not heard anything.  Patient also would like to know what next steps are what she can do about the pain and swelling.

## 2023-02-03 NOTE — Telephone Encounter (Signed)
If they are referring to the pain and swelling of her lower extremities she was referred to vascular. They need to check with them.   Not sure who mentioned to them to be referred to cardiology

## 2023-02-06 ENCOUNTER — Other Ambulatory Visit: Payer: Self-pay | Admitting: Nurse Practitioner

## 2023-02-06 DIAGNOSIS — I110 Hypertensive heart disease with heart failure: Secondary | ICD-10-CM | POA: Diagnosis not present

## 2023-02-06 DIAGNOSIS — S32020D Wedge compression fracture of second lumbar vertebra, subsequent encounter for fracture with routine healing: Secondary | ICD-10-CM | POA: Diagnosis not present

## 2023-02-06 DIAGNOSIS — M48061 Spinal stenosis, lumbar region without neurogenic claudication: Secondary | ICD-10-CM | POA: Diagnosis not present

## 2023-02-06 DIAGNOSIS — E039 Hypothyroidism, unspecified: Secondary | ICD-10-CM | POA: Diagnosis not present

## 2023-02-06 DIAGNOSIS — I5022 Chronic systolic (congestive) heart failure: Secondary | ICD-10-CM

## 2023-02-06 DIAGNOSIS — I5032 Chronic diastolic (congestive) heart failure: Secondary | ICD-10-CM | POA: Diagnosis not present

## 2023-02-06 DIAGNOSIS — I2699 Other pulmonary embolism without acute cor pulmonale: Secondary | ICD-10-CM | POA: Diagnosis not present

## 2023-02-06 DIAGNOSIS — E1165 Type 2 diabetes mellitus with hyperglycemia: Secondary | ICD-10-CM | POA: Diagnosis not present

## 2023-02-06 DIAGNOSIS — I82401 Acute embolism and thrombosis of unspecified deep veins of right lower extremity: Secondary | ICD-10-CM | POA: Diagnosis not present

## 2023-02-06 DIAGNOSIS — K5909 Other constipation: Secondary | ICD-10-CM | POA: Diagnosis not present

## 2023-02-06 MED ORDER — FUROSEMIDE 40 MG PO TABS: 40.0000 mg | ORAL_TABLET | Freq: Every day | ORAL | 3 refills | Status: DC

## 2023-02-06 NOTE — Telephone Encounter (Signed)
Home Health verbal orders Caller Name:Moira  Agency Name: [facility]  Callback number: [phone]  Requesting PT Evaluation   Reason: PT is not needing OT, needs PT evaluation for mobility and strength   Frequency: Evaluation only, will update with frequency   Please forward to [facility] pool or providers CMA    Prescription Request  02/06/2023  LOV: 12/27/2022  What is the name of the medication or equipment? furosemide (LASIX) 80 MG tablet   Have you contacted your pharmacy to request a refill? No   Which pharmacy would you like this sent to?  [id number], [place] - [address] [place] [address] Phone: [phone] Fax: [phone]    Patient notified that their request is being sent to the clinical staff for review and that they should receive a response within 2 business days.   Please advise at [phone]  Patient would also like to know if Susy Frizzle would like her to continue medication pantoprazole (PROTONIX) 40 MG tablet , says she is not having issues with reflux. Patient's family also waned to follow up on a mention of a cardiology referral? Looking through chart notes I did not see indication of this, only vascular surgery. Please advise with response.

## 2023-02-06 NOTE — Telephone Encounter (Signed)
She does have some heart failure. I am happy to send her to cardiology. [place] or [place].  As for the lasix she was suppose to be on the 40mg . I will refill that

## 2023-02-06 NOTE — Telephone Encounter (Signed)
Left voicemail for patient to call the office back.   

## 2023-02-06 NOTE — Telephone Encounter (Signed)
I got a separate noted about the lasix and refilled the 40mg  pill daily. I would recommend continuing the protonix as she is on anticoagulation and this will decrease the risk of a GI bleed

## 2023-02-06 NOTE — Telephone Encounter (Addendum)
Called patient let know to call vascular in regards to referral to cardiology and leg pain. Husband wanted to know if she needs to stay on pantoprazole. States patient is not having any issues with reflux. Husband also wanted to know if she needs to continue with lasix if so only has one pill left.

## 2023-02-07 ENCOUNTER — Telehealth: Payer: Self-pay | Admitting: Nurse Practitioner

## 2023-02-07 MED ORDER — PANTOPRAZOLE SODIUM 40 MG PO TBEC: 40.0000 mg | DELAYED_RELEASE_TABLET | Freq: Every day | ORAL | 3 refills | Status: DC

## 2023-02-07 NOTE — Addendum Note (Signed)
Addended by: Melina Copa on: 02/07/2023 01:07 PM   Modules accepted: Orders

## 2023-02-07 NOTE — Telephone Encounter (Addendum)
Contacted pt's Husband [name]. Advised to howard that referral for cardio and refill for pantoprazole has been placed. [name] understood and verbalized understanding. States that his wife is walking now, doing the dishes. States that her legs are still swollen but will reach out to vascular office and take it one day at a time.

## 2023-02-07 NOTE — Telephone Encounter (Addendum)
#  1 : Contacted pt. Regarding Cardio referral. Pt prefers [facility] location. Advised pt of refills of lasix sent in to pharmacy. Pt stated that they also need a refill for pantoprazole.   #2:   Home Health verbal orders Caller Name:Moira  Agency Name: [facility]   Callback number: [phone]   Requesting PT Evaluation    Reason: PT is not needing OT, needs PT evaluation for mobility and strength    Frequency: Evaluation only, will update with frequency

## 2023-02-07 NOTE — Addendum Note (Signed)
Addended by: Eden Emms on: 02/07/2023 01:36 PM   Modules accepted: Orders

## 2023-02-07 NOTE — Telephone Encounter (Signed)
Charlynne Pander, RN with [facility] wanted Susy Frizzle to be aware that they have received an order for a nursing evaluation for this patient and she has refused services. Charlynne Pander can be reached if needed at [phone], voicemail is secure.

## 2023-02-07 NOTE — Telephone Encounter (Signed)
 Referral and refill placed

## 2023-02-10 ENCOUNTER — Other Ambulatory Visit (INDEPENDENT_AMBULATORY_CARE_PROVIDER_SITE_OTHER): Payer: Self-pay | Admitting: Nurse Practitioner

## 2023-02-10 DIAGNOSIS — M7989 Other specified soft tissue disorders: Secondary | ICD-10-CM

## 2023-02-14 NOTE — Telephone Encounter (Signed)
Patients husband returned call ,I relayed message to him ,however he had a few questions.He would like a return call.

## 2023-02-14 NOTE — Telephone Encounter (Signed)
Called patient husband reviewed all information. He would like to know when they need to have follow up. Let them know we will ask and reach out.

## 2023-02-15 NOTE — Telephone Encounter (Signed)
She can follow up with me in 2 years

## 2023-02-15 NOTE — Telephone Encounter (Signed)
Contacted pt to inform of follow up in 2 months. Pt asked to schedule appointment. Appt has been made for 04/21/23. Pt verbalized understanding.

## 2023-02-16 ENCOUNTER — Other Ambulatory Visit: Payer: Self-pay | Admitting: Nurse Practitioner

## 2023-02-17 ENCOUNTER — Ambulatory Visit (INDEPENDENT_AMBULATORY_CARE_PROVIDER_SITE_OTHER): Payer: Medicare HMO

## 2023-02-17 ENCOUNTER — Encounter (INDEPENDENT_AMBULATORY_CARE_PROVIDER_SITE_OTHER): Payer: Self-pay | Admitting: Nurse Practitioner

## 2023-02-17 ENCOUNTER — Ambulatory Visit (INDEPENDENT_AMBULATORY_CARE_PROVIDER_SITE_OTHER): Payer: Medicare HMO | Admitting: Nurse Practitioner

## 2023-02-17 VITALS — BP 118/65 | HR 62 | Resp 15

## 2023-02-17 DIAGNOSIS — I10 Essential (primary) hypertension: Secondary | ICD-10-CM | POA: Diagnosis not present

## 2023-02-17 DIAGNOSIS — I824Z1 Acute embolism and thrombosis of unspecified deep veins of right distal lower extremity: Secondary | ICD-10-CM | POA: Diagnosis not present

## 2023-02-17 DIAGNOSIS — M7989 Other specified soft tissue disorders: Secondary | ICD-10-CM | POA: Diagnosis not present

## 2023-02-17 DIAGNOSIS — I890 Lymphedema, not elsewhere classified: Secondary | ICD-10-CM

## 2023-02-17 DIAGNOSIS — E118 Type 2 diabetes mellitus with unspecified complications: Secondary | ICD-10-CM | POA: Diagnosis not present

## 2023-02-18 ENCOUNTER — Encounter (INDEPENDENT_AMBULATORY_CARE_PROVIDER_SITE_OTHER): Payer: Self-pay | Admitting: Nurse Practitioner

## 2023-02-18 NOTE — Progress Notes (Signed)
Subjective:    Patient ID: Stacy Kirk, female    DOB: 19-Jul-1945, 77 y.o.   MRN: [id number] Chief Complaint  Patient presents with   Follow-up    4 week ultrasound follow up    The patient returns to the office for followup evaluation regarding leg swelling.  The swelling has persisted and the pain associated with swelling continues. There have not been any interval development of a ulcerations or wounds.  Since the previous visit the patient has been wearing graduated compression stockings and has noted little if any improvement in the lymphedema. The patient has been using compression routinely morning until night.  The patient also states elevation during the day and exercise is being done too.    Today noninvasive studies show no evidence of DVT or superficial thrombophlebitis.  Previous gastrocnemius DVT in the right lower extremity has resolved.  No evidence of deep venous insufficiency or superficial venous reflux noted bilaterally    Review of Systems  Cardiovascular:  Positive for leg swelling.  Neurological:  Positive for weakness.  All other systems reviewed and are negative.      Objective:   Physical Exam Vitals reviewed.  HENT:     Head: Normocephalic.  Cardiovascular:     Rate and Rhythm: Normal rate.     Pulses: Normal pulses.  Pulmonary:     Effort: Pulmonary effort is normal.  Skin:    General: Skin is warm and dry.  Neurological:     Mental Status: She is alert and oriented to person, place, and time.  Psychiatric:        Mood and Affect: Mood normal.        Behavior: Behavior normal.        Thought Content: Thought content normal.        Judgment: Judgment normal.     BP 118/65 (BP Location: Right Arm)   Pulse 62   Resp 15   Past Medical History:  Diagnosis Date   Arthritis    Cancer (HCC)    ovarian, skin face- skin cancer   Complication of anesthesia    slow to awaken everday, even slower with anesthesia   Constipation     Diabetes mellitus    Type II   GERD (gastroesophageal reflux disease)    Headache 05/12/2015   "after a fall" headache for 8 months   Hemorrhoids    Hernia    History of kidney stones    passed   Hypertension    Hypothyroidism    Loss of appetite    Muscle spasm    back   Osteoporosis    Redness    abd wound   Urine frequency    unable to hold urine    Wears hearing aid    bilateral     Social History   Socioeconomic History   Marital status: Married    Spouse name: Not on file   Number of children: 2   Years of education: Not on file   Highest education level: Not on file  Occupational History   Not on file  Tobacco Use   Smoking status: Former    Current packs/day: 0.00    Types: Cigarettes    Start date: 03/29/1983    Quit date: 03/28/1998    Years since quitting: 24.9   Smokeless tobacco: Never   Tobacco comments:    quit 2002  Vaping Use   Vaping status: Never Used  Substance and Sexual Activity  Alcohol use: No   Drug use: No   Sexual activity: Not on file  Other Topics Concern   Not on file  Social History Narrative   Retired      International aid/development worker of [employer] Strain: Not on file  Food Insecurity: No Food Insecurity (11/22/2022)   Hunger Vital Sign    Worried About Running Out of Food in the Last Year: Never true    Ran Out of Food in the Last Year: Never true  Transportation Needs: No Transportation Needs (11/22/2022)   PRAPARE - Administrator, Civil Service (Medical): No    Lack of Transportation (Non-Medical): No  Physical Activity: Not on file  Stress: Not on file  Social Connections: Not on file  Intimate Partner Violence: Not At Risk (11/22/2022)   Humiliation, Afraid, Rape, and Kick questionnaire    Fear of Current or Ex-Partner: No    Emotionally Abused: No    Physically Abused: No    Sexually Abused: No    Past Surgical History:  Procedure Laterality Date   ABDOMINAL HYSTERECTOMY      APPENDECTOMY     CHOLECYSTECTOMY     COLONOSCOPY W/ POLYPECTOMY     HERNIA REPAIR  Jun2012   open component separation w biologic mesh (Dr. Luisa Hart)   TOE SURGERY Left    2nd toe   TONSILLECTOMY      Family History  Problem Relation Age of Onset   Diabetes Mother    Early death Sister    Drug abuse Sister     Allergies  Allergen Reactions   Erythromycin Diarrhea and Other (See Comments)    "Tears" her stomach up. Major digestive upset.   Tramadol Shortness Of Breath and Other (See Comments)    Felt like lungs filled up; unable to lay down   Albumin (Human) Other (See Comments)    Pt unsure of reaction   Aspirin Itching and Other (See Comments)    Caused lethargy, also   Codeine Itching and Other (See Comments)    Cause lethargy, also   Cortisone Other (See Comments)    NO STEROID INJECTIONS; headaches   Linzess [Linaclotide] Diarrhea   Propoxyphene Other (See Comments)    Terrible reaction; headaches   Simvastatin Itching and Other (See Comments)    Severe muscle aches   Triamcinolone Acetonide Other (See Comments)    headache       Latest Ref Rng & Units 12/27/2022   11:41 AM 11/29/2022   10:28 AM 11/23/2022    2:43 AM  CBC  WBC 4.0 - 10.5 K/uL 9.3  8.8  7.5   Hemoglobin 12.0 - 15.0 g/dL 73.4  19.3  79.0   Hematocrit 36.0 - 46.0 % 39.4  39.0  37.3   Platelets 150.0 - 400.0 K/uL 302.0  301.0  201       CMP     Component Value Date/Time   NA 137 12/27/2022 1141   K 3.1 (L) 12/27/2022 1141   CL 88 (L) 12/27/2022 1141   CO2 40 (H) 12/27/2022 1141   GLUCOSE 165 (H) 12/27/2022 1141   BUN 23 12/27/2022 1141   CREATININE 1.08 12/27/2022 1141   CREATININE 1.19 (H) 08/05/2022 1512   CALCIUM 9.5 12/27/2022 1141   PROT 6.0 12/16/2022 1130   ALBUMIN 3.5 12/16/2022 1130   AST 13 12/16/2022 1130   ALT 11 12/16/2022 1130   ALKPHOS 39 12/16/2022 1130   BILITOT 0.9 12/16/2022 1130  GFR 48.84 (L) 12/27/2022 1141   EGFR 60.0 01/06/2023 0919   GFRNONAA >60  11/23/2022 0243     No results found.     Assessment & Plan:   1. Lymphedema Recommend:  No surgery or intervention at this point in time.   The Patient is CEAP [id number].  The patient has been wearing compression for more than 12 weeks with no or little benefit.  The patient has been exercising daily for more than 12 weeks. The patient has been elevating and taking OTC pain medications for more than 12 weeks.  None of these have have eliminated the pain related to the lymphedema or the discomfort regarding excessive swelling and venous congestion.    I have reviewed my discussion with the patient regarding lymphedema and why it  causes symptoms.  Patient will continue wearing graduated compression on a daily basis. The patient should put the compression on first thing in the morning and removing them in the evening. The patient should not sleep in the compression.   In addition, behavioral modification throughout the day will be continued.  This will include frequent elevation (such as in a recliner), use of over the counter pain medications as needed and exercise such as walking.  The systemic causes for chronic edema such as liver, kidney and cardiac etiologies do not appear to have significant changed over the past year.    The patient has chronic , severe lymphedema with hyperpigmentation of the skin and has done MLD, skin care, medication, diet, exercise, elevation and compression for 4 weeks with no improvement,  I am recommending a lymphedema pump.  The patient still has stage 3 lymphedema and therefore, I believe that a lymph pump is needed to improve the control of the patient's lymphedema and improve the quality of life.  Additionally, a lymph pump is warranted because it will reduce the risk of cellulitis and ulceration in the future.  Patient should follow-up in six months   2. Hypertension, unspecified type Continue antihypertensive medications as already ordered, these  medications have been reviewed and there are no changes at this time.  3. Controlled type 2 diabetes mellitus with complication, without long-term current use of insulin (HCC) Continue hypoglycemic medications as already ordered, these medications have been reviewed and there are no changes at this time.  Hgb A1C to be monitored as already arranged by primary service  4. Acute deep vein thrombosis (DVT) of distal vein of right lower extremity (HCC) Studies today show resolution of the right gastrocnemius DVT.  Patient will remain on Eliquis as there have been no issues.   Current Outpatient Medications on File Prior to Visit  Medication Sig Dispense Refill   acetaminophen (TYLENOL) 500 MG tablet Take 1,000 mg by mouth every 6 (six) hours as needed (FOR PAIN.).     apixaban (ELIQUIS) 5 MG TABS tablet Take 1 tablet (5 mg total) by mouth 2 (two) times daily. 60 tablet 3   Baclofen 5 MG TABS TAKE 1 TABLET BY MOUTH 3 TIMES DAILY AS NEEDED. 90 tablet 1   furosemide (LASIX) 40 MG tablet Take 1 tablet (40 mg total) by mouth daily. 30 tablet 3   levothyroxine (SYNTHROID) 100 MCG tablet TAKE 1 TABLET BY MOUTH EVERY DAY 90 tablet 1   metFORMIN (GLUCOPHAGE) 500 MG tablet Take 1 tablet (500 mg total) by mouth daily after supper. (Patient taking differently: Take 500 mg by mouth every evening.) 90 tablet 1   metoprolol succinate (TOPROL-XL) 25 MG 24  hr tablet TAKE 1 TABLET (25 MG TOTAL) BY MOUTH DAILY. 90 tablet 0   pantoprazole (PROTONIX) 40 MG tablet Take 1 tablet (40 mg total) by mouth daily. 30 tablet 3   predniSONE (DELTASONE) 10 MG tablet Take 1 tablet (10 mg total) by mouth daily with breakfast. 15 tablet 0   docusate sodium (COLACE) 100 MG capsule Take 100 mg by mouth at bedtime. (Patient not taking: Reported on 12/16/2022)     losartan (COZAAR) 25 MG tablet Take 1 tablet (25 mg total) by mouth daily. 90 tablet 1   potassium chloride SA (KLOR-CON M) 20 MEQ tablet Take 2 tablets (40 mEq total) by  mouth daily for 2 days. 4 tablet 0   No current facility-administered medications on file prior to visit.    There are no Patient Instructions on file for this visit. No follow-ups on file.   Georgiana Spinner, NP

## 2023-02-27 ENCOUNTER — Telehealth: Payer: Self-pay | Admitting: Nurse Practitioner

## 2023-02-27 NOTE — Telephone Encounter (Signed)
Patient's husband contacted the office regarding symptoms patient was having over the weekend. Husband states patient was very dizzy over the weekend, they called the pharmacy and were told that it could be a side effect of medication baclofen. Patient's husband has since stopped giving the medication to her, patient says she is feeling better. Husband wants to know if he should bring her in to be seen?

## 2023-02-27 NOTE — Telephone Encounter (Signed)
I will have patient triaged. If the dizziness has resolved and no other concerns we can discontinue the baclofen

## 2023-02-27 NOTE — Telephone Encounter (Addendum)
Pt is hard of hearing so pts husband spoke with me with pt in background also talking. Pt feeling much better today pt stopped baclofen on 02/25/23.  Pt has no dizziness today. Pyt legs are swollen and itchy and feet are red for last 3 - 5 days. NO PAIN IN LOWER EXTREMITIES.They hav special needs child and have no one to watch the child.so could not make appt to be seen or go to [facility] or ED. UC & ED precautions given and if pt needs to will call 911 and go to ED. Pts husband will call 02/28/23 withupdate. Sending note to Audria Nine NP and [employer].

## 2023-02-28 NOTE — Telephone Encounter (Signed)
Noted patient has been dealing with swollen legs and is followed by vascular

## 2023-03-01 ENCOUNTER — Other Ambulatory Visit: Payer: Self-pay | Admitting: Nurse Practitioner

## 2023-03-01 DIAGNOSIS — E118 Type 2 diabetes mellitus with unspecified complications: Secondary | ICD-10-CM

## 2023-03-02 ENCOUNTER — Other Ambulatory Visit: Payer: Self-pay | Admitting: Nurse Practitioner

## 2023-03-02 DIAGNOSIS — S32020A Wedge compression fracture of second lumbar vertebra, initial encounter for closed fracture: Secondary | ICD-10-CM

## 2023-03-03 ENCOUNTER — Encounter: Payer: Self-pay | Admitting: Nurse Practitioner

## 2023-03-03 ENCOUNTER — Ambulatory Visit (INDEPENDENT_AMBULATORY_CARE_PROVIDER_SITE_OTHER): Payer: Medicare HMO | Admitting: Nurse Practitioner

## 2023-03-03 VITALS — BP 130/80 | HR 91 | Temp 98.4°F | Ht 60.0 in

## 2023-03-03 DIAGNOSIS — I10 Essential (primary) hypertension: Secondary | ICD-10-CM | POA: Diagnosis not present

## 2023-03-03 DIAGNOSIS — J0140 Acute pansinusitis, unspecified: Secondary | ICD-10-CM | POA: Insufficient documentation

## 2023-03-03 DIAGNOSIS — R600 Localized edema: Secondary | ICD-10-CM | POA: Diagnosis not present

## 2023-03-03 DIAGNOSIS — Z7984 Long term (current) use of oral hypoglycemic drugs: Secondary | ICD-10-CM | POA: Diagnosis not present

## 2023-03-03 DIAGNOSIS — E118 Type 2 diabetes mellitus with unspecified complications: Secondary | ICD-10-CM | POA: Diagnosis not present

## 2023-03-03 LAB — POCT GLYCOSYLATED HEMOGLOBIN (HGB A1C): Hemoglobin A1C: 6.9 % — AB (ref 4.0–5.6)

## 2023-03-03 MED ORDER — DOXYCYCLINE HYCLATE 100 MG PO TABS: 100.0000 mg | ORAL_TABLET | Freq: Two times a day (BID) | ORAL | 0 refills | Status: DC

## 2023-03-03 NOTE — Assessment & Plan Note (Signed)
Patient currently maintained on metoprolol, furosemide, losartan.  Blood pressure well-controlled continue medication as prescribed

## 2023-03-03 NOTE — Patient Instructions (Signed)
Nice to see you today Get a lotion and keep your legs moisturized.  Follow up and do as the vascular specialist recommends

## 2023-03-03 NOTE — Assessment & Plan Note (Signed)
Patient currently maintained on metformin 500 mg daily.  A1c is 6.9%.  Goal would be under 8 for her due to fragility.  Continue medication as prescribed

## 2023-03-03 NOTE — Assessment & Plan Note (Signed)
History of the same.  Patient has tried diuretics without good relief.  Patient is also tried compression, exercises, elevation without good relief she is being followed by vascular pending lymphedema pump currently.  Encourage patient to keep skin hydrated this will help with the itching and hopefully prevent openings in the skin.  Follow-up with vascular as recommended

## 2023-03-03 NOTE — Assessment & Plan Note (Signed)
Will treat patient with doxycycline 100 mg twice daily for 7 days.  Follow-up if no improvement. 

## 2023-03-03 NOTE — Progress Notes (Signed)
Established Patient Office Visit  Subjective   Patient ID: Stacy Kirk, female    DOB: 1945-08-27  Age: 77 y.o. MRN: [id number]  Chief Complaint  Patient presents with   Leg Swelling    Pt states she is only able to walk to bathroom and make coffee, states she cannot walk for long. States legs are tight and itchy. Pt complains that recliner is probably not elevated enough. States she uses pillows.     HPI  Lower extremity edema: Patient been seen by me and vascular in the past for lower extremity.  Patient recently seen 02/17/2019 for for lymphedema that is not responsive to elevation or compression garments.  Patient has a lymphedema pump ordered.  Of note they did do a repeat ultrasound that showed resolution of the gastrocnemius right DVT.  Patient is on Eliquis vascular recommends continuing as patient is having any trouble.  Patient still complaining of lower extremity edema.  States that he called and will be out this coming Monday to get patient fitted and started with her lymphedema pump.  Per patient's spouse she is not the most adherent to the compression garments as they are "tight and hard to get home".  DM2: does not check her sugar at home. Is taking metformin 500mg  daily. States that she has not done that well because the "sugar calls her name".  Sinus: about 2 days ago she belw her nose and had some blood. States that she did have a headache that has since improved.  Patient does describe some sinus pressure has been going on for extended period of time.    Review of Systems  Constitutional:  Negative for chills and fever.  HENT:  Positive for congestion and sinus pain. Negative for ear pain and sore throat.   Respiratory:  Negative for shortness of breath.   Cardiovascular:  Positive for leg swelling. Negative for chest pain.  Gastrointestinal:  Negative for abdominal pain, constipation, diarrhea, nausea and vomiting.  Genitourinary:  Negative for dysuria and  hematuria.  Neurological:  Negative for dizziness.      Objective:     BP 130/80   Pulse 91   Temp 98.4 F (36.9 C) (Oral)   Ht 5' (1.524 m)   SpO2 91%   BMI 35.94 kg/m  BP Readings from Last 3 Encounters:  03/03/23 130/80  02/17/23 118/65  01/17/23 113/66   Wt Readings from Last 3 Encounters:  12/27/22 184 lb (83.5 kg)  12/16/22 184 lb (83.5 kg)  11/22/22 179 lb 14.3 oz (81.6 kg)   SpO2 Readings from Last 3 Encounters:  03/03/23 91%  12/27/22 92%  12/16/22 94%      Physical Exam Vitals and nursing note reviewed.  Constitutional:      Appearance: Normal appearance.  HENT:     Right Ear: Tympanic membrane, ear canal and external ear normal.     Left Ear: Tympanic membrane, ear canal and external ear normal.     Nose:     Left Sinus: Maxillary sinus tenderness and frontal sinus tenderness present.     Mouth/Throat:     Mouth: Mucous membranes are moist.     Pharynx: Oropharynx is clear.  Cardiovascular:     Rate and Rhythm: Normal rate and regular rhythm.     Heart sounds: Normal heart sounds.  Pulmonary:     Effort: Pulmonary effort is normal.     Breath sounds: Normal breath sounds.  Abdominal:     General: Bowel sounds  are normal.  Musculoskeletal:     Right lower leg: Edema present.     Left lower leg: Edema present.  Lymphadenopathy:     Cervical: No cervical adenopathy.  Neurological:     Mental Status: She is alert.      Results for orders placed or performed in visit on 03/03/23  POCT glycosylated hemoglobin (Hb A1C)  Result Value Ref Range   Hemoglobin A1C 6.9 (A) 4.0 - 5.6 %   HbA1c POC (<> result, manual entry)     HbA1c, POC (prediabetic range)     HbA1c, POC (controlled diabetic range)        The ASCVD Risk score (Arnett DK, et al., 2019) failed to calculate for the following reasons:   Cannot find a previous HDL lab   Cannot find a previous total cholesterol lab    Assessment & Plan:   Problem List Items Addressed This  Visit       Cardiovascular and Mediastinum   Hypertension    Patient currently maintained on metoprolol, furosemide, losartan.  Blood pressure well-controlled continue medication as prescribed        Respiratory   Acute non-recurrent pansinusitis - Primary    Will treat patient with doxycycline 100 mg twice daily for 7 days.  Follow-up if no improvement      Relevant Medications   doxycycline (VIBRA-TABS) 100 MG tablet     Endocrine   Controlled type 2 diabetes mellitus with complication, without long-term current use of insulin (HCC)    Patient currently maintained on metformin 500 mg daily.  A1c is 6.9%.  Goal would be under 8 for her due to fragility.  Continue medication as prescribed      Relevant Orders   POCT glycosylated hemoglobin (Hb A1C) (Completed)     Other   Bilateral lower extremity edema    History of the same.  Patient has tried diuretics without good relief.  Patient is also tried compression, exercises, elevation without good relief she is being followed by vascular pending lymphedema pump currently.  Encourage patient to keep skin hydrated this will help with the itching and hopefully prevent openings in the skin.  Follow-up with vascular as recommended       Return in about 4 months (around 07/02/2023) for DM recheck.    Audria Nine, NP

## 2023-03-06 ENCOUNTER — Telehealth: Payer: Self-pay | Admitting: Nurse Practitioner

## 2023-03-06 DIAGNOSIS — J0140 Acute pansinusitis, unspecified: Secondary | ICD-10-CM

## 2023-03-06 NOTE — Telephone Encounter (Signed)
It can cause the diarrhea for sure. The others are not listed at side effects. I would recommend that she takes the medication with food

## 2023-03-06 NOTE — Telephone Encounter (Signed)
Patient husband called in and stated that she was prescribed doxycycline (VIBRA-TABS) 100 MG tablet. He stated that since taking it she has had headaches, diarrhea and shakiness. He was wanting to know if this normal side effects from the medication. Please advise. Thank you!

## 2023-03-07 MED ORDER — AMOXICILLIN-POT CLAVULANATE 875-125 MG PO TABS: 1.0000 | ORAL_TABLET | Freq: Two times a day (BID) | ORAL | 0 refills | Status: AC

## 2023-03-07 NOTE — Telephone Encounter (Signed)
Contacted pt regarding Doxycycline. States that medication is making pt dizzy and shaky. Stacy Kirk would like to know if there is anything else she can take. Stacy Kirk also states that he has held off on giving pt more. Please advise.

## 2023-03-07 NOTE — Addendum Note (Signed)
Addended by: Eden Emms on: 03/07/2023 12:30 PM   Modules accepted: Orders

## 2023-03-07 NOTE — Telephone Encounter (Signed)
We can try Augmentin. This needs to be taken with food also

## 2023-03-07 NOTE — Telephone Encounter (Signed)
Patient notified rx was changed and to also take with food.

## 2023-03-08 ENCOUNTER — Other Ambulatory Visit: Payer: Self-pay | Admitting: Nurse Practitioner

## 2023-03-15 ENCOUNTER — Other Ambulatory Visit: Payer: Self-pay | Admitting: Nurse Practitioner

## 2023-03-15 NOTE — Telephone Encounter (Signed)
Copied from CRM [id number]. Topic: Clinical - Medication Refill >> Mar 15, 2023  3:53 PM Sim Boast F wrote: **Clinical Med Refill on list is not routing to clinical pool -Advised to send message as CRM**     Medication: metoprolol succinate (TOPROL-XL) 25 MG 24 hr tablet [[id number]]    Has the patient contacted their pharmacy? Yes  (Agent: If no, request that the patient contact the pharmacy for the refill. If patient does not wish to contact the pharmacy document the reason why and proceed with request.)  (Agent: If yes, when and what did the pharmacy advise?)    Is this the correct pharmacy for this prescription? Yes  If no, delete pharmacy and type the correct one.  This is the patient's preferred pharmacy:    [phone] [place] - [address]  [address]  [place] [address]  Phone: [phone] Fax: [phone]      Has the prescription been filled recently? Yes    Is the patient out of the medication? No, 4 pills left    Has the patient been seen for an appointment in the last year OR does the patient have an upcoming appointment? Yes    Can we respond through MyChart? No

## 2023-03-15 NOTE — Telephone Encounter (Signed)
Copied from CRM [id number]. Topic: Clinical - Medication Refill >> Mar 15, 2023  3:48 PM Sim Boast F wrote: Most Recent Primary Care Visit:  Provider: Eden Emms  Department: [facility]  Visit Type: OFFICE VISIT  Date: 03/03/2023  Medication: metoprolol succinate (TOPROL-XL) 25 MG 24 hr tablet [[id number]]  Has the patient contacted their pharmacy? Yes (Agent: If no, request that the patient contact the pharmacy for the refill. If patient does not wish to contact the pharmacy document the reason why and proceed with request.) (Agent: If yes, when and what did the pharmacy advise?)  Is this the correct pharmacy for this prescription? Yes If no, delete pharmacy and type the correct one.  This is the patient's preferred pharmacy:   [phone] [place] [place] - [address] [place] [address] Phone: [phone] Fax: [phone]   Has the prescription been filled recently? Yes  Is the patient out of the medication? No, 4 pills left   Has the patient been seen for an appointment in the last year OR does the patient have an upcoming appointment? Yes  Can we respond through MyChart? No  Agent: Please be advised that Rx refills may take up to 3 business days. We ask that you follow-up with your pharmacy.

## 2023-03-16 MED ORDER — METOPROLOL SUCCINATE ER 25 MG PO TB24: 25.0000 mg | ORAL_TABLET | Freq: Every day | ORAL | 2 refills | Status: AC

## 2023-04-03 ENCOUNTER — Other Ambulatory Visit: Payer: Self-pay | Admitting: Nurse Practitioner

## 2023-04-08 ENCOUNTER — Other Ambulatory Visit: Payer: Self-pay | Admitting: Nurse Practitioner

## 2023-04-08 DIAGNOSIS — S32020A Wedge compression fracture of second lumbar vertebra, initial encounter for closed fracture: Secondary | ICD-10-CM

## 2023-04-10 ENCOUNTER — Ambulatory Visit: Payer: Self-pay | Admitting: Nurse Practitioner

## 2023-04-10 NOTE — Telephone Encounter (Signed)
  Chief Complaint: BUE swelling Symptoms: swelling, clear drainage Frequency: months Pertinent Negatives: Patient denies CHF, denies cardiac hx, denies injury, denies CP, denies SOB, denies fever.  Disposition: [] ED /[] Urgent Care (no appt availability in office) / [x] Appointment(In office/virtual)/ []  [facility]/ [] Home Care/ [] Refused Recommended Disposition /[] [facility]/ []  Follow-up with PCP  Additional Notes: Pt fractured her back in July states since that occurred she has been sitting in her recliner as much as possible. Pt developed leg swelling months ago and has been seen for this prior. Pt states it is not getting worse but it is still occurring and would like to be seen for it. Denies fever, states R leg is draining. States that the drainage is clear. Pt states that she has a disabled son and the only day they have a caregiver is Fridays, pt requesting appt on fri. Pt sched. Pt advised to seek ED treatment if she develops SOB, CP, or a fever.   Copied from CRM [id number]. Topic: Clinical - Red Word Triage >> Apr 10, 2023 10:29 AM Drema MATSU wrote: Red Word that prompted transfer to Nurse Triage: Patient fractured her back awhile ago (July) and her legs and feet are swollen and right leg is leaking. Feet are red and skin looks like its rotten off. Reason for Disposition  [1] MILD swelling of both ankles (i.e., pedal edema) AND [2] is a chronic symptom (recurrent or ongoing AND present > 4 weeks)  Answer Assessment - Initial Assessment Questions 1. ONSET: When did the swelling start? (e.g., minutes, hours, days)     4 months ago 2. LOCATION: What part of the leg is swollen?  Are both legs swollen or just one leg?     Whole leg,  3. SEVERITY: How bad is the swelling? (e.g., localized; mild, moderate, severe)   - Localized: Small area of swelling localized to one leg.   - MILD pedal edema: Swelling limited to foot and ankle, pitting edema < 1/4 inch (6  mm) deep, rest and elevation eliminate most or all swelling.   - MODERATE edema: Swelling of lower leg to knee, pitting edema > 1/4 inch (6 mm) deep, rest and elevation only partially reduce swelling.   - SEVERE edema: Swelling extends above knee, facial or hand swelling present.      moderate 4. REDNESS: Does the swelling look red or infected?     Only feet are red, redness to the area that is leaking 5. PAIN: Is the swelling painful to touch? If Yes, ask: How painful is it?   (Scale 1-10; mild, moderate or severe)     Denies pain 6. FEVER: Do you have a fever? If Yes, ask: What is it, how was it measured, and when did it start?      denies 7. CAUSE: What do you think is causing the leg swelling?     Has been stuck in chair since July as she broke her back at that time 8. MEDICAL HISTORY: Do you have a history of blood clots (e.g., DVT), cancer, heart failure, kidney disease, or liver failure?     Blood clots hx 9. RECURRENT SYMPTOM: Have you had leg swelling before? If Yes, ask: When was the last time? What happened that time?     denies 10. OTHER SYMPTOMS: Do you have any other symptoms? (e.g., chest pain, difficulty breathing)       denies  Protocols used: Leg Swelling and Edema-A-AH

## 2023-04-10 NOTE — Telephone Encounter (Signed)
 Noted. I appreciate Dr Ermalene Searing evaluating patient. Patient has been sent to vascular for this issue

## 2023-04-14 ENCOUNTER — Encounter: Payer: Self-pay | Admitting: Family Medicine

## 2023-04-14 ENCOUNTER — Ambulatory Visit (INDEPENDENT_AMBULATORY_CARE_PROVIDER_SITE_OTHER): Payer: Medicare HMO | Admitting: Family Medicine

## 2023-04-14 VITALS — BP 115/62 | HR 88 | Temp 97.7°F | Ht 60.0 in | Wt 210.0 lb

## 2023-04-14 DIAGNOSIS — I890 Lymphedema, not elsewhere classified: Secondary | ICD-10-CM | POA: Diagnosis not present

## 2023-04-14 DIAGNOSIS — I5020 Unspecified systolic (congestive) heart failure: Secondary | ICD-10-CM

## 2023-04-14 DIAGNOSIS — R600 Localized edema: Secondary | ICD-10-CM

## 2023-04-14 DIAGNOSIS — R5383 Other fatigue: Secondary | ICD-10-CM | POA: Diagnosis not present

## 2023-04-14 DIAGNOSIS — E559 Vitamin D deficiency, unspecified: Secondary | ICD-10-CM | POA: Diagnosis not present

## 2023-04-14 NOTE — Progress Notes (Addendum)
Patient ID: Stacy Kirk, female    DOB: Aug 04, 1945, 78 y.o.   MRN: [id number]  This visit was conducted in person.  BP 115/62 (BP Location: Left Arm, Patient Position: Sitting, Cuff Size: Normal)   Pulse 88   Temp 97.7 F (36.5 C) (Temporal)   Ht 5' (1.524 m)   Wt 210 lb (95.3 kg)   SpO2 94%   BMI 41.01 kg/m    CC:  Chief Complaint  Patient presents with   Leg Swelling    Worsening-Leaking fluid    Fatigue    Sleeps alot   Chills    Cold all the time    Subjective:   HPI: Stacy Kirk is a 78 y.o. female  patient of Stacy Kirk with history of lymphedema, HTN, DM,  Chornic systolic heart failure, history of pulmonary embolism.presenting on 04/14/2023 for Leg Swelling (Worsening-Leaking fluid/), Fatigue (Sleeps alot), and Chills (Cold all the time)   Followed by vascular for lymphedema Reviewed last OV 03/02/2024 She is not responsive to elevation or compression garments. Patient has a lymphedema pump ordered. Of note they did do a repeat ultrasound that showed resolution of the gastrocnemius right DVT.     Has new pt OV with cardiology 05/05/2023  ECHO: 09/2022 EF 40-45%   HTN .Marland Kitchen Low normal BP in office today on metoprolol, furosemide and losartyan BP Readings from Last 3 Encounters:  04/14/23 115/62  03/03/23 130/80  02/17/23 118/65   She reports continue peripheral edema in legs, now leaking fluid... ongoing for months, not really worse.  On furosemide 40 mg daily.  Lymphedema still in process getting set... per husband paperwork being work on.   She has gained significant amount of weight in last 3 months.  Some DOE with lifting.  No chest pain.  Wakes up feeling tired... unrestful sleep... can only sleep in recliner. Wt Readings from Last 3 Encounters:  04/14/23 210 lb (95.3 kg)  12/27/22 184 lb (83.5 kg)  12/16/22 184 lb (83.5 kg)     Relevant past medical, surgical, family and social history reviewed and updated as indicated. Interim medical  history since our last visit reviewed. Allergies and medications reviewed and updated. Outpatient Medications Prior to Visit  Medication Sig Dispense Refill   acetaminophen (TYLENOL) 500 MG tablet Take 1,000 mg by mouth every 6 (six) hours as needed (FOR PAIN.).     apixaban (ELIQUIS) 5 MG TABS tablet Take 1 tablet (5 mg total) by mouth 2 (two) times daily. 60 tablet 3   furosemide (LASIX) 40 MG tablet TAKE 1 TABLET BY MOUTH EVERY DAY 90 tablet 1   levothyroxine (SYNTHROID) 100 MCG tablet TAKE 1 TABLET BY MOUTH EVERY DAY 90 tablet 1   losartan (COZAAR) 25 MG tablet Take 1 tablet (25 mg total) by mouth daily. 90 tablet 1   metFORMIN (GLUCOPHAGE) 500 MG tablet TAKE 1 TABLET (500 MG TOTAL) BY MOUTH DAILY AFTER SUPPER. 90 tablet 1   metoprolol succinate (TOPROL-XL) 25 MG 24 hr tablet Take 1 tablet (25 mg total) by mouth daily. 90 tablet 2   pantoprazole (PROTONIX) 40 MG tablet TAKE 1 TABLET BY MOUTH EVERY DAY 90 tablet 1   predniSONE (DELTASONE) 10 MG tablet Take 1 tablet (10 mg total) by mouth daily with breakfast. 15 tablet 0   potassium chloride SA (KLOR-CON M) 20 MEQ tablet Take 2 tablets (40 mEq total) by mouth daily for 2 days. (Patient not taking: Reported on 04/14/2023) 4 tablet 0  Baclofen 5 MG TABS TAKE 1 TABLET BY MOUTH THREE TIMES A DAY AS NEEDED 90 tablet 1   docusate sodium (COLACE) 100 MG capsule Take 100 mg by mouth at bedtime.     No facility-administered medications prior to visit.     Per HPI unless specifically indicated in ROS section below Review of Systems  Constitutional:  Negative for fatigue and fever.  HENT:  Negative for congestion.   Eyes:  Negative for pain.  Respiratory:  Negative for cough and shortness of breath.   Cardiovascular:  Positive for leg swelling. Negative for chest pain and palpitations.  Gastrointestinal:  Negative for abdominal pain.  Genitourinary:  Negative for dysuria and vaginal bleeding.  Musculoskeletal:  Negative for back pain.   Neurological:  Negative for syncope, light-headedness and headaches.  Psychiatric/Behavioral:  Negative for dysphoric mood.    Objective:  BP 115/62 (BP Location: Left Arm, Patient Position: Sitting, Cuff Size: Normal)   Pulse 88   Temp 97.7 F (36.5 C) (Temporal)   Ht 5' (1.524 m)   Wt 210 lb (95.3 kg)   SpO2 94%   BMI 41.01 kg/m   Wt Readings from Last 3 Encounters:  04/14/23 210 lb (95.3 kg)  12/27/22 184 lb (83.5 kg)  12/16/22 184 lb (83.5 kg)      Physical Exam Constitutional:      General: She is not in acute distress.    Appearance: Normal appearance. She is well-developed. She is obese. She is not ill-appearing or toxic-appearing.     Comments: In wheelchair  HENT:     Head: Normocephalic.     Right Ear: Hearing, tympanic membrane, ear canal and external ear normal. Tympanic membrane is not erythematous, retracted or bulging.     Left Ear: Hearing, tympanic membrane, ear canal and external ear normal. Tympanic membrane is not erythematous, retracted or bulging.     Nose: No mucosal edema or rhinorrhea.     Right Sinus: No maxillary sinus tenderness or frontal sinus tenderness.     Left Sinus: No maxillary sinus tenderness or frontal sinus tenderness.     Mouth/Throat:     Pharynx: Uvula midline.  Eyes:     General: Lids are normal. Lids are everted, no foreign bodies appreciated.     Conjunctiva/sclera: Conjunctivae normal.     Pupils: Pupils are equal, round, and reactive to light.  Neck:     Thyroid: No thyroid mass or thyromegaly.     Vascular: No carotid bruit.     Trachea: Trachea normal.  Cardiovascular:     Rate and Rhythm: Normal rate and regular rhythm.     Pulses: Normal pulses.     Heart sounds: Normal heart sounds, S1 normal and S2 normal. No murmur heard.    No friction rub. No gallop.     Comments:  Weeping on right Pulmonary:     Effort: Pulmonary effort is normal. No tachypnea or respiratory distress.     Breath sounds: Normal breath sounds.  No decreased breath sounds, wheezing, rhonchi or rales.  Abdominal:     General: Bowel sounds are normal.     Palpations: Abdomen is soft.     Tenderness: There is no abdominal tenderness.  Musculoskeletal:     Cervical back: Normal range of motion and neck supple.     Right lower leg: 2+ Edema present.     Left lower leg: 2+ Edema present.  Skin:    General: Skin is warm and dry.  Findings: No rash.  Neurological:     Mental Status: She is alert.  Psychiatric:        Mood and Affect: Mood is not anxious or depressed.        Speech: Speech normal.        Behavior: Behavior normal. Behavior is cooperative.        Thought Content: Thought content normal.        Judgment: Judgment normal.       Results for orders placed or performed in visit on 03/03/23  POCT glycosylated hemoglobin (Hb A1C)   Collection Time: 03/03/23  2:45 PM  Result Value Ref Range   Hemoglobin A1C 6.9 (A) 4.0 - 5.6 %   HbA1c POC (<> result, manual entry)     HbA1c, POC (prediabetic range)     HbA1c, POC (controlled diabetic range)      Assessment and Plan  HFrEF (heart failure with reduced ejection fraction) (HCC) Assessment & Plan: Chronic, no acute worsening of baseline shortness of breath with exertion. Patient on furosemide 40 mg daily with minimal diuresis. Will reevaluate BNP given increase in weight significantly from last check. Consider addition of spironolactone for further diuresis. Patient has upcoming new patient appointment that she was encouraged to keep with cardiology.  Orders: -     Brain natriuretic peptide -     Comprehensive metabolic panel  Bilateral lower extremity edema Assessment & Plan: Chronic, per patient not significantly acutely worse but just continued. Encouraged patient to elevate legs above her heart, she refuses compression hose, encouraged her to move forward with setting up lymphedema pumps through vascular. No evidence of unilateral change/no red flags for  cellulitis or new DVT.    Lymphedema  Other fatigue Assessment & Plan:  Acute, on chronic, will evaluate with labs for secondary cause, but fatigue likely secondary to  HFrEF,  and deconditioningand por sleep. Possible sleep apnea.    Orders: -     TSH -     CBC with Differential/Platelet -     Vitamin B12 -     VITAMIN D 25 Hydroxy (Vit-D Deficiency, Fractures)    No follow-ups on file.   Kerby Nora, MD

## 2023-04-14 NOTE — Patient Instructions (Addendum)
Elevated legs above heart, try to increase activity.  Move forward with lymphedema pumps.  Please stop at the lab to have labs drawn.   Keep appt with cardiology.  Can consider addition of spironolactone another fluid pill depending on lab findings.

## 2023-04-14 NOTE — Assessment & Plan Note (Signed)
Acute, on chronic, will evaluate with labs for secondary cause, but fatigue likely secondary to  HFrEF,  and deconditioningand por sleep. Possible sleep apnea.

## 2023-04-14 NOTE — Assessment & Plan Note (Signed)
Chronic, no acute worsening of baseline shortness of breath with exertion. Patient on furosemide 40 mg daily with minimal diuresis. Will reevaluate BNP given increase in weight significantly from last check. Consider addition of spironolactone for further diuresis. Patient has upcoming new patient appointment that she was encouraged to keep with cardiology.

## 2023-04-14 NOTE — Assessment & Plan Note (Signed)
Chronic, per patient not significantly acutely worse but just continued. Encouraged patient to elevate legs above her heart, she refuses compression hose, encouraged her to move forward with setting up lymphedema pumps through vascular. No evidence of unilateral change/no red flags for cellulitis or new DVT.

## 2023-04-17 LAB — CBC WITH DIFFERENTIAL/PLATELET
Absolute Lymphocytes: 1226 {cells}/uL (ref 850–3900)
Absolute Monocytes: 475 {cells}/uL (ref 200–950)
Basophils Absolute: 29 {cells}/uL (ref 0–200)
Basophils Relative: 0.3 %
Eosinophils Absolute: 114 {cells}/uL (ref 15–500)
Eosinophils Relative: 1.2 %
HCT: 39.1 % (ref 35.0–45.0)
Hemoglobin: 12.9 g/dL (ref 11.7–15.5)
MCH: 29.9 pg (ref 27.0–33.0)
MCHC: 33 g/dL (ref 32.0–36.0)
MCV: 90.7 fL (ref 80.0–100.0)
MPV: 9.7 fL (ref 7.5–12.5)
Monocytes Relative: 5 %
Neutro Abs: 7657 {cells}/uL (ref 1500–7800)
Neutrophils Relative %: 80.6 %
Platelets: 305 10*3/uL (ref 140–400)
RBC: 4.31 10*6/uL (ref 3.80–5.10)
RDW: 13.2 % (ref 11.0–15.0)
Total Lymphocyte: 12.9 %
WBC: 9.5 10*3/uL (ref 3.8–10.8)

## 2023-04-17 LAB — COMPREHENSIVE METABOLIC PANEL
AG Ratio: 1.4 (calc) (ref 1.0–2.5)
ALT: 12 U/L (ref 6–29)
AST: 14 U/L (ref 10–35)
Albumin: 3.9 g/dL (ref 3.6–5.1)
Alkaline phosphatase (APISO): 49 U/L (ref 37–153)
BUN/Creatinine Ratio: 22 (calc) (ref 6–22)
BUN: 26 mg/dL — ABNORMAL HIGH (ref 7–25)
CO2: 36 mmol/L — ABNORMAL HIGH (ref 20–32)
Calcium: 9.5 mg/dL (ref 8.6–10.4)
Chloride: 89 mmol/L — ABNORMAL LOW (ref 98–110)
Creat: 1.16 mg/dL — ABNORMAL HIGH (ref 0.60–1.00)
Globulin: 2.7 g/dL (ref 1.9–3.7)
Glucose, Bld: 123 mg/dL — ABNORMAL HIGH (ref 65–99)
Potassium: 4.2 mmol/L (ref 3.5–5.3)
Sodium: 139 mmol/L (ref 135–146)
Total Bilirubin: 1 mg/dL (ref 0.2–1.2)
Total Protein: 6.6 g/dL (ref 6.1–8.1)

## 2023-04-17 LAB — TSH: TSH: 2.02 m[IU]/L (ref 0.40–4.50)

## 2023-04-17 LAB — VITAMIN B12: Vitamin B-12: 636 pg/mL (ref 200–1100)

## 2023-04-17 LAB — BRAIN NATRIURETIC PEPTIDE

## 2023-04-17 LAB — VITAMIN D 25 HYDROXY (VIT D DEFICIENCY, FRACTURES): Vit D, 25-Hydroxy: 54 ng/mL (ref 30–100)

## 2023-04-19 ENCOUNTER — Other Ambulatory Visit: Payer: Self-pay | Admitting: Family Medicine

## 2023-04-19 DIAGNOSIS — I5022 Chronic systolic (congestive) heart failure: Secondary | ICD-10-CM

## 2023-04-19 DIAGNOSIS — I5020 Unspecified systolic (congestive) heart failure: Secondary | ICD-10-CM

## 2023-04-19 MED ORDER — SPIRONOLACTONE 25 MG PO TABS: 25.0000 mg | ORAL_TABLET | Freq: Every day | ORAL | 3 refills | Status: DC

## 2023-04-19 NOTE — Addendum Note (Signed)
Addended by: Vincenza Hews on: 04/19/2023 02:58 PM   Modules accepted: Orders

## 2023-04-21 ENCOUNTER — Ambulatory Visit: Payer: Medicare HMO | Admitting: Nurse Practitioner

## 2023-04-28 ENCOUNTER — Other Ambulatory Visit: Payer: Medicare HMO

## 2023-04-28 ENCOUNTER — Ambulatory Visit: Payer: Medicare HMO | Admitting: Nurse Practitioner

## 2023-05-01 ENCOUNTER — Other Ambulatory Visit: Payer: Self-pay | Admitting: Family Medicine

## 2023-05-01 ENCOUNTER — Other Ambulatory Visit: Payer: Self-pay | Admitting: Nurse Practitioner

## 2023-05-02 ENCOUNTER — Other Ambulatory Visit: Payer: Self-pay | Admitting: Family Medicine

## 2023-05-05 ENCOUNTER — Ambulatory Visit: Payer: Medicare HMO | Attending: Cardiology | Admitting: Cardiology

## 2023-05-05 VITALS — BP 102/76 | HR 95 | Ht 60.0 in

## 2023-05-05 DIAGNOSIS — I5020 Unspecified systolic (congestive) heart failure: Secondary | ICD-10-CM | POA: Diagnosis not present

## 2023-05-05 DIAGNOSIS — R0683 Snoring: Secondary | ICD-10-CM

## 2023-05-05 DIAGNOSIS — I10 Essential (primary) hypertension: Secondary | ICD-10-CM | POA: Diagnosis not present

## 2023-05-05 MED ORDER — TORSEMIDE 20 MG PO TABS: 40.0000 mg | ORAL_TABLET | Freq: Every day | ORAL | 0 refills | Status: DC

## 2023-05-05 NOTE — Patient Instructions (Addendum)
 Medication Instructions:   STOP Losartan  STOP Lasix  START Torsemide  - Take two tablet ( 40mg ) by mouth daily.   *If you need a refill on your cardiac medications before your next appointment, please call your pharmacy*   Lab Work:  None Ordered  If you have labs (blood work) drawn today and your tests are completely normal, you will receive your results only by: MyChart Message (if you have MyChart) OR A paper copy in the mail If you have any lab test that is abnormal or we need to change your treatment, we will call you to review the results.   Testing/Procedures:  Your physician has requested that you have an echocardiogram. Echocardiography is a painless test that uses sound waves to create images of your heart. It provides your doctor with information about the size and shape of your heart and how well your heart's chambers and valves are working. This procedure takes approximately one hour. There are no restrictions for this procedure. Please do NOT wear cologne, perfume, aftershave, or lotions (deodorant is allowed). Please arrive 15 minutes prior to your appointment time.  Please note: We ask at that you not bring children with you during ultrasound (echo/ vascular) testing. Due to room size and safety concerns, children are not allowed in the ultrasound rooms during exams. Our front office staff cannot provide observation of children in our lobby area while testing is being conducted. An adult accompanying a patient to their appointment will only be allowed in the ultrasound room at the discretion of the ultrasound technician under special circumstances. We apologize for any inconvenience.    Follow-Up: At [facility], you and your health needs are our priority.  As part of our continuing mission to provide you with exceptional heart care, we have created designated Provider Care Teams.  These Care Teams include your primary Cardiologist (physician) and Advanced  Practice Providers (APPs -  Physician Assistants and Nurse Practitioners) who all work together to provide you with the care you need, when you need it.  We recommend signing up for the patient portal called MyChart.  Sign up information is provided on this After Visit Summary.  MyChart is used to connect with patients for Virtual Visits (Telemedicine).  Patients are able to view lab/test results, encounter notes, upcoming appointments, etc.  Non-urgent messages can be sent to your provider as well.   To learn more about what you can do with MyChart, go to [email or web address].    Your next appointment:    After Echocardiogram  Provider:   You may see Redell Cave, MD or one of the following Advanced Practice Providers on your designated Care Team:   Lonni Meager, NP Bernardino Bring, PA-C Cadence Franchester, PA-C Tylene Lunch, NP Barnie Hila, NP

## 2023-05-05 NOTE — Progress Notes (Signed)
 Cardiology Office Note:    Date:  05/05/2023   ID:  Stacy Kirk, DOB [birth year], MRN [id number]  PCP:  Wendee Lynwood HERO, NP   [facility] Providers Cardiologist:  Redell Cave, MD     Referring MD: Wendee Lynwood HERO, NP   No chief complaint on file.   History of Present Illness:    ILLYRIA Kirk is a 78 y.o. female with a hx of HFrEF, hypertension, diabetes, lymphedema, DVT/PE on Eliquis , lumbar stenosis s/p L4-L5 fusion, hard of hearing who presents to establish care.  She was admitted to the hospital July 2024 after sustaining a mechanical fall.  Workup with chest CT revealed right lower lobe PE and right lower extremity DVT.  Echocardiogram obtained 10/04/2022 showed EF 40 to 45%, moderate aortic stenosis.  Was started on GDMT prior to discharge.  Endorses leg edema, orthopnea.  Sleeps in the recliner.  Takes Lasix  40 mg daily without significant improvement in edema.  Denies chest pain, minimal shortness of breath when she overexerts herself.  However, she does not walk much.  Past Medical History:  Diagnosis Date   Arthritis    Cancer (HCC)    ovarian, skin face- skin cancer   Complication of anesthesia    slow to awaken everday, even slower with anesthesia   Constipation    Diabetes mellitus    Type II   GERD (gastroesophageal reflux disease)    Headache 05/12/2015   after a fall headache for 8 months   Hemorrhoids    Hernia    History of kidney stones    passed   Hypertension    Hypothyroidism    Loss of appetite    Muscle spasm    back   Osteoporosis    Redness    abd wound   Urine frequency    unable to hold urine    Wears hearing aid    bilateral     Past Surgical History:  Procedure Laterality Date   ABDOMINAL HYSTERECTOMY     APPENDECTOMY     CHOLECYSTECTOMY     COLONOSCOPY W/ POLYPECTOMY     HERNIA REPAIR  Jun2012   open component separation w biologic mesh (Dr. Vanderbilt)   TOE SURGERY Left    2nd toe   TONSILLECTOMY       Current Medications: Current Meds  Medication Sig   acetaminophen  (TYLENOL ) 500 MG tablet Take 1,000 mg by mouth every 6 (six) hours as needed (FOR PAIN.).   Biotin 800 MCG TABS Take 1 tablet by mouth daily.   ELIQUIS  5 MG TABS tablet TAKE 1 TABLET BY MOUTH TWICE A DAY   levothyroxine  (SYNTHROID ) 100 MCG tablet TAKE 1 TABLET BY MOUTH EVERY DAY   metFORMIN  (GLUCOPHAGE ) 500 MG tablet TAKE 1 TABLET (500 MG TOTAL) BY MOUTH DAILY AFTER SUPPER.   metoprolol  succinate (TOPROL -XL) 25 MG 24 hr tablet Take 1 tablet (25 mg total) by mouth daily.   pantoprazole  (PROTONIX ) 40 MG tablet TAKE 1 TABLET BY MOUTH EVERY DAY   predniSONE  (DELTASONE ) 10 MG tablet Take 1 tablet (10 mg total) by mouth daily with breakfast.   spironolactone  (ALDACTONE ) 25 MG tablet Take 1 tablet (25 mg total) by mouth daily.   torsemide  (DEMADEX ) 20 MG tablet Take 2 tablets (40 mg total) by mouth daily.   [DISCONTINUED] furosemide  (LASIX ) 40 MG tablet TAKE 1 TABLET BY MOUTH EVERY DAY   [DISCONTINUED] losartan  (COZAAR ) 25 MG tablet TAKE 1 TABLET (25 MG TOTAL) BY MOUTH DAILY.  Allergies:   Erythromycin , Tramadol, Albumin (human), Aspirin , Codeine, Cortisone, Linzess  [linaclotide ], Propoxyphene, Simvastatin, and Triamcinolone acetonide   Social History   Socioeconomic History   Marital status: Married    Spouse name: Not on file   Number of children: 2   Years of education: Not on file   Highest education level: Not on file  Occupational History   Not on file  Tobacco Use   Smoking status: Former    Current packs/day: 0.00    Types: Cigarettes    Start date: 03/29/1983    Quit date: 03/28/1998    Years since quitting: 25.1   Smokeless tobacco: Never   Tobacco comments:    quit 2002  Vaping Use   Vaping status: Never Used  Substance and Sexual Activity   Alcohol  use: No   Drug use: No   Sexual activity: Not on file  Other Topics Concern   Not on file  Social History Narrative   Retired      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[employer] Strain: Not on file  Food Insecurity: No Food Insecurity (11/22/2022)   Hunger Vital Sign    Worried About Running Out of Food in the Last Year: Never true    Ran Out of Food in the Last Year: Never true  Transportation Needs: No Transportation Needs (11/22/2022)   PRAPARE - Administrator, Civil Service (Medical): No    Lack of Transportation (Non-Medical): No  Physical Activity: Not on file  Stress: Not on file  Social Connections: Not on file     Family History: The patient's family history includes Diabetes in her mother; Drug abuse in her sister; Early death in her sister.  ROS:   Please see the history of present illness.     All other systems reviewed and are negative.  EKGs/Labs/Other Studies Reviewed:    The following studies were reviewed today:  EKG Interpretation Date/Time:  Friday May 05 2023 14:47:46 EST Ventricular Rate:  99 PR Interval:  156 QRS Duration:  86 QT Interval:  388 QTC Calculation: 497 R Axis:   14  Text Interpretation: Sinus rhythm with marked sinus arrhythmia Minimal voltage criteria for LVH, may be normal variant ( R in aVL ) Cannot rule out Anterior infarct , age undetermined Confirmed by Darliss Rogue ([id number]) on 05/05/2023 2:52:54 PM    Recent Labs: 09/30/2022: Magnesium  1.2 12/16/2022: Pro B Natriuretic peptide (BNP) 31.0 04/14/2023: ALT 12; Brain Natriuretic Peptide CANCELED; BUN 26; Creat 1.16; Hemoglobin 12.9; Platelets 305; Potassium 4.2; Sodium 139; TSH 2.02  Recent Lipid Panel No results found for: CHOL, TRIG, HDL, CHOLHDL, VLDL, LDLCALC, LDLDIRECT   Risk Assessment/Calculations:              Physical Exam:    VS:  BP 102/76   Pulse 95   Ht 5' (1.524 m)   SpO2 90%   BMI 41.01 kg/m     Wt Readings from Last 3 Encounters:  04/14/23 210 lb (95.3 kg)  12/27/22 184 lb (83.5 kg)  12/16/22 184 lb (83.5 kg)     GEN:  Well nourished, well developed in no acute  distress HEENT: Normal NECK: No JVD; No carotid bruits CARDIAC: RRR, distant heart sounds. RESPIRATORY:  Clear to auscultation without rales, wheezing or rhonchi  ABDOMEN: Soft, non-tender, non-distended MUSCULOSKELETAL:  2+ edema; No deformity  SKIN: Warm and dry NEUROLOGIC:  Alert and oriented x 3 PSYCHIATRIC:  Normal affect   ASSESSMENT:  1. HFrEF (heart failure with reduced ejection fraction) (HCC)   2. Primary hypertension   3. Snoring    PLAN:    In order of problems listed above:  HFrEF, EF 40 to 45%.  2+ edema, orthopnea.  BP low normal.  Stop Lasix , stop losartan .  Start torsemide  40 mg daily.  Check BMP as scheduled next week with PCP.  Continue Aldactone  25 mg daily, Toprol -XL 25 mg daily.  Repeat echocardiogram.  Refer to advanced heart failure clinic.  Consider ischemic eval after euvolemia.   Hypertension, BP controlled.  Aldactone , Toprol -XL, torsemide  as above. Snoring, will need sleep study after euvolemia.  Follow-up after echocardiogram.      Medication Adjustments/Labs and Tests Ordered: Current medicines are reviewed at length with the patient today.  Concerns regarding medicines are outlined above.  Orders Placed This Encounter  Procedures   AMB referral to CHF clinic   EKG 12-Lead   ECHOCARDIOGRAM COMPLETE   Meds ordered this encounter  Medications   torsemide  (DEMADEX ) 20 MG tablet    Sig: Take 2 tablets (40 mg total) by mouth daily.    Dispense:  180 tablet    Refill:  0    Patient Instructions  Medication Instructions:   STOP Losartan  STOP Lasix  START Torsemide  - Take two tablet ( 40mg ) by mouth daily.   *If you need a refill on your cardiac medications before your next appointment, please call your pharmacy*   Lab Work:  None Ordered  If you have labs (blood work) drawn today and your tests are completely normal, you will receive your results only by: MyChart Message (if you have MyChart) OR A paper copy in the mail If you  have any lab test that is abnormal or we need to change your treatment, we will call you to review the results.   Testing/Procedures:  Your physician has requested that you have an echocardiogram. Echocardiography is a painless test that uses sound waves to create images of your heart. It provides your doctor with information about the size and shape of your heart and how well your heart's chambers and valves are working. This procedure takes approximately one hour. There are no restrictions for this procedure. Please do NOT wear cologne, perfume, aftershave, or lotions (deodorant is allowed). Please arrive 15 minutes prior to your appointment time.  Please note: We ask at that you not bring children with you during ultrasound (echo/ vascular) testing. Due to room size and safety concerns, children are not allowed in the ultrasound rooms during exams. Our front office staff cannot provide observation of children in our lobby area while testing is being conducted. An adult accompanying a patient to their appointment will only be allowed in the ultrasound room at the discretion of the ultrasound technician under special circumstances. We apologize for any inconvenience.    Follow-Up: At [facility], you and your health needs are our priority.  As part of our continuing mission to provide you with exceptional heart care, we have created designated Provider Care Teams.  These Care Teams include your primary Cardiologist (physician) and Advanced Practice Providers (APPs -  Physician Assistants and Nurse Practitioners) who all work together to provide you with the care you need, when you need it.  We recommend signing up for the patient portal called MyChart.  Sign up information is provided on this After Visit Summary.  MyChart is used to connect with patients for Virtual Visits (Telemedicine).  Patients are able to view lab/test results,  encounter notes, upcoming appointments, etc.   Non-urgent messages can be sent to your provider as well.   To learn more about what you can do with MyChart, go to [email or web address].    Your next appointment:    After Echocardiogram  Provider:   You may see Redell Cave, MD or one of the following Advanced Practice Providers on your designated Care Team:   Lonni Meager, NP Bernardino Bring, PA-C Cadence Franchester, PA-C Tylene Lunch, NP Barnie Hila, NP    Signed, Redell Cave, MD  05/05/2023 4:40 PM     [facility]

## 2023-05-06 ENCOUNTER — Emergency Department
Admission: EM | Admit: 2023-05-06 | Discharge: 2023-05-06 | Disposition: A | Discharge status: Home / Self Care | Payer: Medicare HMO | Attending: Emergency Medicine | Admitting: Emergency Medicine

## 2023-05-06 ENCOUNTER — Other Ambulatory Visit: Payer: Self-pay

## 2023-05-06 ENCOUNTER — Emergency Department: Payer: Medicare HMO

## 2023-05-06 DIAGNOSIS — E119 Type 2 diabetes mellitus without complications: Secondary | ICD-10-CM | POA: Insufficient documentation

## 2023-05-06 DIAGNOSIS — Z7901 Long term (current) use of anticoagulants: Secondary | ICD-10-CM | POA: Insufficient documentation

## 2023-05-06 DIAGNOSIS — S0101XA Laceration without foreign body of scalp, initial encounter: Secondary | ICD-10-CM | POA: Diagnosis not present

## 2023-05-06 DIAGNOSIS — Y92012 Bathroom of single-family (private) house as the place of occurrence of the external cause: Secondary | ICD-10-CM | POA: Diagnosis not present

## 2023-05-06 DIAGNOSIS — I10 Essential (primary) hypertension: Secondary | ICD-10-CM | POA: Diagnosis not present

## 2023-05-06 DIAGNOSIS — W01198A Fall on same level from slipping, tripping and stumbling with subsequent striking against other object, initial encounter: Secondary | ICD-10-CM | POA: Insufficient documentation

## 2023-05-06 DIAGNOSIS — R58 Hemorrhage, not elsewhere classified: Secondary | ICD-10-CM | POA: Diagnosis not present

## 2023-05-06 DIAGNOSIS — Z23 Encounter for immunization: Secondary | ICD-10-CM | POA: Insufficient documentation

## 2023-05-06 DIAGNOSIS — R9082 White matter disease, unspecified: Secondary | ICD-10-CM | POA: Diagnosis not present

## 2023-05-06 DIAGNOSIS — W19XXXA Unspecified fall, initial encounter: Secondary | ICD-10-CM

## 2023-05-06 DIAGNOSIS — S0990XA Unspecified injury of head, initial encounter: Secondary | ICD-10-CM | POA: Diagnosis not present

## 2023-05-06 DIAGNOSIS — S0003XA Contusion of scalp, initial encounter: Secondary | ICD-10-CM | POA: Diagnosis not present

## 2023-05-06 DIAGNOSIS — R9089 Other abnormal findings on diagnostic imaging of central nervous system: Secondary | ICD-10-CM | POA: Diagnosis not present

## 2023-05-06 MED ORDER — TETANUS-DIPHTH-ACELL PERTUSSIS 5-2.5-18.5 LF-MCG/0.5 IM SUSY
0.5000 mL | PREFILLED_SYRINGE | Freq: Once | INTRAMUSCULAR | Status: AC
  Administered 2023-05-06: 0.5 mL via INTRAMUSCULAR
  Filled 2023-05-06: qty 0.5

## 2023-05-06 MED ORDER — CEPHALEXIN 500 MG PO CAPS: 500.0000 mg | ORAL_CAPSULE | Freq: Four times a day (QID) | ORAL | 0 refills | Status: AC

## 2023-05-06 NOTE — ED Notes (Signed)
 EDP at bedside

## 2023-05-06 NOTE — ED Notes (Signed)
 Pt up to toilet and back in bed with fresh warm blankets.

## 2023-05-06 NOTE — ED Notes (Signed)
 PT called husband to arrange for ride. Pt's brother is coming to pick up pt.

## 2023-05-06 NOTE — ED Provider Notes (Signed)
 [name] Provider Note    Event Date/Time   First MD Initiated Contact with Patient 05/06/23 1844     (approximate)   History   Chief Complaint Fall   HPI  Stacy Kirk is a 78 y.o. female with past medical history of hypertension, diabetes, and DVT/PE on Eliquis  who presents to the ED following fall.  Patient reports that she lost her balance in the bathroom just prior to arrival, falling and striking the left side of her head on the edge of her door.  She did not lose consciousness but had significant bleeding at home that EMS was able to control with pressure.  Patient reports a headache but denies any neck pain, also denies any injuries to her trunk or extremities.     Physical Exam   Triage Vital Signs: ED Triage Vitals  Encounter Vitals Group     BP 05/06/23 1847 125/71     Systolic BP Percentile --      Diastolic BP Percentile --      Pulse Rate 05/06/23 1846 (!) 106     Resp 05/06/23 1846 17     Temp 05/06/23 1846 98.6 F (37 C)     Temp Source 05/06/23 1846 Oral     SpO2 05/06/23 1846 98 %     Weight --      Height --      Head Circumference --      Peak Flow --      Pain Score 05/06/23 1835 6     Pain Loc --      Pain Education --      Exclude from Growth Chart --     Most recent vital signs: Vitals:   05/06/23 1847 05/06/23 2106  BP: 125/71 (!) 140/69  Pulse:  85  Resp:  18  Temp:    SpO2:  96%    Constitutional: Alert and oriented. Eyes: Conjunctivae are normal. Head: Large curvilinear laceration to left parietal scalp with small area of pulsatile bleeding. Nose: No congestion/rhinnorhea. Mouth/Throat: Mucous membranes are moist.  Neck: No midline cervical spine tenderness to palpation. Cardiovascular: Normal rate, regular rhythm. Grossly normal heart sounds.  2+ radial pulses bilaterally. Respiratory: Normal respiratory effort.  No retractions. Lungs CTAB.  No chest wall tenderness to  palpation. Gastrointestinal: Soft and nontender. No distention. Musculoskeletal: No lower extremity tenderness nor edema.  No upper extremity bony tenderness to palpation. Neurologic:  Normal speech and language. No gross focal neurologic deficits are appreciated.    ED Results / Procedures / Treatments   Labs (all labs ordered are listed, but only abnormal results are displayed) Labs Reviewed - No data to display  RADIOLOGY CT head reviewed and interpreted by me with no hemorrhage or midline shift.  PROCEDURES:  Critical Care performed: No  .Laceration Repair  Date/Time: 05/06/2023 7:15 PM  Performed by: Willo Dunnings, MD Authorized by: Willo Dunnings, MD   Consent:    Consent obtained:  Verbal   Consent given by:  Patient Universal protocol:    Patient identity confirmed:  Verbally with patient and arm band Anesthesia:    Anesthesia method:  Local infiltration   Local anesthetic:  Lidocaine  2% WITH epi Laceration details:    Location:  Scalp   Scalp location:  L parietal   Length (cm):  13 Exploration:    Hemostasis achieved with:  Epinephrine  and direct pressure   Wound exploration: wound explored through full range of motion and entire depth  of wound visualized     Wound extent: areolar tissue not violated, fascia not violated, no foreign body, no signs of injury, no nerve damage, no tendon damage, no underlying fracture and no vascular damage     Contaminated: no   Treatment:    Area cleansed with:  Saline   Amount of cleaning:  Standard   Irrigation solution:  Sterile saline   Irrigation method:  Pressure wash   Debridement:  None   Undermining:  None   Scar revision: no   Skin repair:    Repair method:  Sutures   Suture size:  3-0   Suture material:  Nylon   Number of sutures:  11 Approximation:    Approximation:  Close Repair type:    Repair type:  Simple Post-procedure details:    Dressing:  Non-adherent dressing   Procedure completion:   Tolerated well, no immediate complications    MEDICATIONS ORDERED IN ED: Medications  Tdap (BOOSTRIX) injection 0.5 mL (has no administration in time range)     IMPRESSION / MDM / ASSESSMENT AND PLAN / ED COURSE  I reviewed the triage vital signs and the nursing notes.                              78 y.o. female with past medical history of hypertension, diabetes, and DVT/PE on Eliquis  who presents to the ED following fall where she hit her head on the door and had significant bleeding at home.  Patient's presentation is most consistent with acute presentation with potential threat to life or bodily function.  Differential diagnosis includes, but is not limited to, calvarial fracture, intracranial hemorrhage, laceration, cervical spine injury.  Patient nontoxic-appearing and in no acute distress, vital signs are unremarkable.  With removal of EMS dressing, patient noted to have large curvilinear laceration to her left parietal scalp with small area of pulsatile bleeding at the posterior portion.  After injection of lidocaine  with epinephrine  and 3 sutures, I was able to control the bleeding, remainder of laceration repaired without difficulty.  We will further assess with CT head and cervical spine, no evidence of traumatic injury to her trunk or extremities.  Patient denies any symptoms of anemia.  CT head and cervical spine are negative for acute process, wound remains hemostatic on reassessment.  Patient is appropriate for discharge home, will prescribe prophylactic antibiotics and she was counseled to follow-up with PCP in 1 week for suture removal.  She was counseled to return to the ED for new or worsening symptoms, patient agrees with plan.      FINAL CLINICAL IMPRESSION(S) / ED DIAGNOSES   Final diagnoses:  Fall, initial encounter  Laceration of scalp, initial encounter     Rx / DC Orders   ED Discharge Orders          Ordered    cephALEXin  (KEFLEX ) 500 MG capsule  4  times daily        05/06/23 2122             Note:  This document was prepared using Dragon voice recognition software and may include unintentional dictation errors.   Willo Dunnings, MD 05/06/23 2122

## 2023-05-06 NOTE — ED Notes (Signed)
 Changed pt into hospital gown and cleaned off as much blood from pt as possible.

## 2023-05-06 NOTE — ED Triage Notes (Signed)
 Patient from home AOX4. Patient in bathroom. Machanical fall hit head on the left side. Patient was baracaded in bathroom with walker. Hit the door on the frame. Patient takes Eliqus. Hx of PE's and DVT's.  Patient. 148/72, 55, 94%.   Patient AOX4 Ripped of BP cuff and did not allow staff to obtain VS. EDP at bedisde.

## 2023-05-08 ENCOUNTER — Telehealth: Payer: Self-pay

## 2023-05-08 NOTE — Transitions of Care (Post Inpatient/ED Visit) (Signed)
   05/08/2023  Name: Stacy Kirk MRN: [id number] DOB: [birth year]  Today's TOC FU Call Status: Today's TOC FU Call Status:: Unsuccessful Call (1st Attempt) Unsuccessful Call (1st Attempt) Date: 05/08/23  Attempted to reach the patient regarding the most recent Inpatient/ED visit.  Follow Up Plan: Additional outreach attempts will be made to reach the patient to complete the Transitions of Care (Post Inpatient/ED visit) call.   Signature  Agnes Lawrence, CMA (AAMA)  [facility] [phone]

## 2023-05-10 ENCOUNTER — Other Ambulatory Visit: Payer: Self-pay | Admitting: Family Medicine

## 2023-05-10 ENCOUNTER — Other Ambulatory Visit: Payer: Self-pay | Admitting: Nurse Practitioner

## 2023-05-12 ENCOUNTER — Ambulatory Visit: Payer: Medicare HMO | Admitting: Nurse Practitioner

## 2023-05-12 ENCOUNTER — Other Ambulatory Visit: Payer: Medicare HMO

## 2023-05-12 VITALS — BP 124/78 | HR 95 | Temp 98.1°F | Ht 60.0 in | Wt 210.0 lb

## 2023-05-12 DIAGNOSIS — I5020 Unspecified systolic (congestive) heart failure: Secondary | ICD-10-CM | POA: Diagnosis not present

## 2023-05-12 DIAGNOSIS — Z09 Encounter for follow-up examination after completed treatment for conditions other than malignant neoplasm: Secondary | ICD-10-CM

## 2023-05-12 DIAGNOSIS — I5022 Chronic systolic (congestive) heart failure: Secondary | ICD-10-CM

## 2023-05-12 DIAGNOSIS — Z4802 Encounter for removal of sutures: Secondary | ICD-10-CM | POA: Insufficient documentation

## 2023-05-12 DIAGNOSIS — R040 Epistaxis: Secondary | ICD-10-CM | POA: Insufficient documentation

## 2023-05-12 LAB — BRAIN NATRIURETIC PEPTIDE: Pro B Natriuretic peptide (BNP): 33 pg/mL (ref 0.0–100.0)

## 2023-05-12 LAB — BASIC METABOLIC PANEL
BUN: 36 mg/dL — ABNORMAL HIGH (ref 6–23)
CO2: 40 meq/L — ABNORMAL HIGH (ref 19–32)
Calcium: 9.5 mg/dL (ref 8.4–10.5)
Chloride: 87 meq/L — ABNORMAL LOW (ref 96–112)
Creatinine, Ser: 1.39 mg/dL — ABNORMAL HIGH (ref 0.40–1.20)
GFR: 35.99 mL/min — ABNORMAL LOW (ref >60.00)
Glucose, Bld: 139 mg/dL — ABNORMAL HIGH (ref 70–99)
Potassium: 3.8 meq/L (ref 3.5–5.1)
Sodium: 137 meq/L (ref 135–145)

## 2023-05-12 NOTE — Progress Notes (Signed)
Acute Office Visit  Subjective:     Patient ID: Stacy Kirk, female    DOB: 1945-10-06, 78 y.o.   MRN: [id number]  Chief Complaint  Patient presents with   Epistaxis    Pt complains of nose bleed after blowing her nose two weeks ago.  Pt complains of being exhausted more than usual. Pt states nose feels dry today.     HPI Patient is in today for Epistaxis with a history of CHF, HTN. PE, GI bleed, DM2, thyroid disease, lymphedema Patient is anticoaulated on eliquis 5mg  BID   States that she did blow her nose and she hasd a lot of blood that filled up the tissue this happened approx 2 weeks ago.  Patient had no trouble since.  She was concerned that she had a sinus infection but is feeling well.  Patient was seen in the emergency department on 05/06/2023 for fall.  Patient had a mechanical fall.  State that she has a marble top that was wet her arm slipped and she fell in the bathroom.  She was taken to the emergency department via EMS.  Patient did sustain a laceration to the left side of her skull.  Patient required 11 sutures.  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 Patient seems to be doing fine she is present with her husband today she is in a wheelchair which she normally presents when.   Review of Systems  Constitutional:  Negative for chills and fever.  Respiratory:  Negative for shortness of breath.   Cardiovascular:  Negative for chest pain.  Neurological:  Negative for dizziness and headaches.        Objective:    BP 124/78   Pulse 95   Temp 98.1 F (36.7 C) (Oral)   Ht 5' (1.524 m)   Wt 210 lb (95.3 kg)   SpO2 94%   BMI 41.01 kg/m  BP Readings from Last 3 Encounters:  05/12/23 124/78  05/06/23 (!) 126/93  05/05/23 102/76   Wt Readings from Last 3  Encounters:  05/12/23 210 lb (95.3 kg)  04/14/23 210 lb (95.3 kg)  12/27/22 184 lb (83.5 kg)   SpO2 Readings from Last 3 Encounters:  05/12/23 94%  05/06/23 96%  05/05/23 90%      Physical Exam Vitals and nursing note reviewed.  Constitutional:      Appearance: Normal appearance.  Cardiovascular:     Rate and Rhythm: Normal rate and regular rhythm.     Heart sounds: Normal heart sounds.  Pulmonary:     Effort: Pulmonary effort is normal.     Breath sounds: Normal breath sounds.  Skin:    Findings: Bruising present.       Neurological:     Mental Status: She is alert.     Results for orders placed or performed in visit on 05/12/23  Brain natriuretic peptide  Result Value Ref Range   Pro B Natriuretic peptide (BNP) 33.0 0.0 - 100.0 pg/mL        Assessment & Plan:   Problem List Items Addressed This Visit       Other   Hospital discharge follow-up - Primary   Did review ED note along with imaging.      Epistaxis   1 episode that self resolved.  Patient  is on blood thinner did recommend Ocean Spray nasal saline as directed      Visit for suture removal   Verbal consent obtained.  Did remove all 11 sutures.  Patient tolerated procedure well.  Wound well-approximated with minimal bleeding.  Patient was instructed not to scrub the head for an additional 3 days.  And to be gentle when she is washing her head thereafter.       No orders of the defined types were placed in this encounter.   Return if symptoms worsen or fail to improve, for As scheduled .  Audria Nine, NP

## 2023-05-12 NOTE — Assessment & Plan Note (Signed)
1 episode that self resolved.  Patient is on blood thinner did recommend Ocean Spray nasal saline as directed

## 2023-05-12 NOTE — Patient Instructions (Signed)
Nice to see you today Avoid truly washing or scrubbing your head for another 3 days Follow up with me as scheduled   You can use some "ocean Spray" nasal saline to help with the dry nose

## 2023-05-12 NOTE — Assessment & Plan Note (Signed)
Verbal consent obtained.  Did remove all 11 sutures.  Patient tolerated procedure well.  Wound well-approximated with minimal bleeding.  Patient was instructed not to scrub the head for an additional 3 days.  And to be gentle when she is washing her head thereafter.

## 2023-05-12 NOTE — Assessment & Plan Note (Signed)
Did review ED note along with imaging.

## 2023-05-15 ENCOUNTER — Other Ambulatory Visit: Payer: Self-pay

## 2023-05-15 ENCOUNTER — Other Ambulatory Visit: Payer: Self-pay | Admitting: Family Medicine

## 2023-05-15 MED ORDER — TORSEMIDE 20 MG PO TABS: 40.0000 mg | ORAL_TABLET | Freq: Every day | ORAL | 0 refills | Status: DC

## 2023-05-15 NOTE — Telephone Encounter (Signed)
 Requested Prescriptions   Signed Prescriptions Disp Refills   torsemide (DEMADEX) 20 MG tablet 180 tablet 0    Sig: Take 2 tablets (40 mg total) by mouth daily.    Authorizing Provider: Debbe Odea    Ordering User: Guerry Minors

## 2023-05-17 ENCOUNTER — Other Ambulatory Visit: Payer: Self-pay | Admitting: Nurse Practitioner

## 2023-05-17 NOTE — Telephone Encounter (Signed)
 Copied from CRM [id number]. Topic: Clinical - Medication Refill >> May 17, 2023  2:43 PM Kathryne Eriksson wrote: Most Recent Primary Care Visit:  Provider: Eden Emms  Department: [facility]  Visit Type: OFFICE VISIT  Date: 05/12/2023  Medication: pantoprazole (PROTONIX) 40 MG tablet  Has the patient contacted their pharmacy? Yes (Agent: If no, request that the patient contact the pharmacy for the refill. If patient does not wish to contact the pharmacy document the reason why and proceed with request.) (Agent: If yes, when and what did the pharmacy advise?)  Is this the correct pharmacy for this prescription? Yes If no, delete pharmacy and type the correct one.  This is the patient's preferred pharmacy:  [facility] [phone] [facility] [place] - [address] [place] [address] Phone: [phone] Fax: [phone]   Has the prescription been filled recently? No  Is the patient out of the medication? Yes  Has the patient been seen for an appointment in the last year OR does the patient have an upcoming appointment? Yes  Can we respond through MyChart? Yes  Agent: Please be advised that Rx refills may take up to 3 business days. We ask that you follow-up with your pharmacy.

## 2023-05-17 NOTE — Telephone Encounter (Signed)
 This RN contacted patient's husband [name] who is DPR to inform him that patient had refills left at the pharmacy for Protonix. Patient's husband verbalized understanding.

## 2023-05-18 ENCOUNTER — Telehealth: Payer: Self-pay | Admitting: Cardiology

## 2023-05-18 NOTE — Telephone Encounter (Signed)
 Unable to reach pt, called twice phone kept ringing then started clicking. To confirm appt for 05/19/23

## 2023-05-19 ENCOUNTER — Encounter: Payer: Medicare HMO | Admitting: Cardiology

## 2023-05-19 NOTE — Progress Notes (Deleted)
   ADVANCED HEART FAILURE NEW PATIENT CLINIC NOTE  Referring Physician: Debbe Odea, MD  Primary Care: Eden Emms, NP Primary Cardiologist:  HPI: Stacy Kirk is a 77 y.o. female with a PMH of HFrEF, hypertension, diabetes, lymphedema, DVT/PE on Eliquis who presents for initial visit for further evaluation and treatment of heart failure/cardiomyopathy.      {Anything typed between these two boxes will persist and can be pulled forward to future notes. This phrase will delete itself when the note is signed :1}      SUBJECTIVE:   PMH, current medications, allergies, social history, and family history reviewed in epic.  PHYSICAL EXAM: There were no vitals filed for this visit. GENERAL: Well nourished and in no apparent distress at rest.  PULM:  Normal work of breathing, clear to auscultation bilaterally. Respirations are unlabored.  CARDIAC:  JVP: ***         Normal rate with regular rhythm. No murmurs, rubs or gallops.  *** edema. Warm and well perfused extremities. ABDOMEN: Soft, non-tender, non-distended. NEUROLOGIC: Patient is oriented x3 with no focal or lateralizing neurologic deficits.    DATA REVIEW  ECG: ***    ECHO: ***  CATH: ***    Heart failure review: - Classification: {HFCLASS:30917} - Etiology: {Cardiomyopathy:30918} - NYHA Class:  - Volume status: {volumechf:30919} - ACEi/ARB/ARNI: {HF:30752} - Aldosterone antagonist: {HF:30752} - Beta-blocker: {HF:30752} - Digoxin: {HF:30752} - Hydralazine/Nitrates: {HF:30752} - SGLT2i: {HF:30752} - GLP-1: {GLP:30906} - Advanced therapies: {Advancedtherapies:30916} - ICD: {ICD:30901}   ASSESSMENT & PLAN:  ***  Follow up in ***  Clearnce Hasten, MD Advanced Heart Failure Mechanical Circulatory Support 05/19/23

## 2023-05-30 ENCOUNTER — Ambulatory Visit (INDEPENDENT_AMBULATORY_CARE_PROVIDER_SITE_OTHER): Admitting: Nurse Practitioner

## 2023-05-30 VITALS — BP 128/82 | HR 79 | Temp 98.0°F | Ht 60.0 in | Wt 210.0 lb

## 2023-05-30 DIAGNOSIS — R6883 Chills (without fever): Secondary | ICD-10-CM | POA: Insufficient documentation

## 2023-05-30 DIAGNOSIS — T148XXA Other injury of unspecified body region, initial encounter: Secondary | ICD-10-CM | POA: Insufficient documentation

## 2023-05-30 DIAGNOSIS — R339 Retention of urine, unspecified: Secondary | ICD-10-CM | POA: Insufficient documentation

## 2023-05-30 LAB — POC COVID19 BINAXNOW: SARS Coronavirus 2 Ag: NEGATIVE

## 2023-05-30 NOTE — Patient Instructions (Signed)
 Nice to see you  I will be in touch once I get the urine sample Follow up with me in 1 week Use cool compresses on the lump on the head

## 2023-05-30 NOTE — Assessment & Plan Note (Signed)
 Status post suture removal the patient scratched had causing the bleed nontender and semifluctuant in office.  No signs of infection.  Wound is approximated.  Conservative treatment with cool compresses follow-up 1 week.  Patient is on blood thinners but has not grown since it started.

## 2023-05-30 NOTE — Assessment & Plan Note (Signed)
Pending UA.  

## 2023-05-30 NOTE — Progress Notes (Addendum)
 Acute Office Visit  Subjective:     Patient ID: Stacy Kirk, female    DOB: 25-Oct-1945, 78 y.o.   MRN: [id number]  Chief Complaint  Patient presents with   lump on head    Pt complains of of hard bump on left side of head. States it appeared a couple of days after stiches were taken out. Pt states that the bump started to bleed once pt sratched the area. Complains of bump being itchy.    Chills    Pt complains of chills that started 4 days ago. No fever, no body aches, no sore throat   Medication Refill    Prednisone for back pain.      HPI Patient is in today for multiple complaints with a history of CHF, HTN, DM2,  Chills: started approx 4 days ago. That has been intermittent and has not gotten beette ror worse. States that she will sneeze and have some skin itching, no sick contacts. Last time she was out was the office visit with me   Lump on head: patient had a fall and was seen in the ED and had sutures placed. She was seen by me on 05/12/2023 and we removed all the sutures. States approx2 weeks ago she was brushing her hair and noticed some blood. After that the lump appeared. It has been present for approx 2 weeks. No pain, tenderness, warmth, headaches, or purulent discharge.  Review of Systems  Constitutional:  Positive for chills. Negative for fever.  HENT:  Negative for sore throat.   Respiratory:  Negative for shortness of breath.   Cardiovascular:  Negative for chest pain.  Genitourinary:  Negative for dysuria, frequency and urgency.       Feels like she needs to urinate a lot but a little comes out  Neurological:  Negative for dizziness and headaches.  Psychiatric/Behavioral:  Negative for hallucinations and suicidal ideas.         Objective:    BP 128/82   Pulse 79   Temp 98 F (36.7 C) (Oral)   Ht 5' (1.524 m)   Wt 210 lb (95.3 kg) Comment: per chart  SpO2 93%   BMI 41.01 kg/m    Physical Exam Vitals and nursing note reviewed.   Constitutional:      Appearance: Normal appearance.  Cardiovascular:     Rate and Rhythm: Normal rate and regular rhythm.     Heart sounds: Normal heart sounds.  Pulmonary:     Effort: Pulmonary effort is normal.     Breath sounds: Normal breath sounds.  Abdominal:     General: Bowel sounds are normal. There is no distension.     Palpations: There is no mass.     Tenderness: There is no abdominal tenderness.     Hernia: No hernia is present.  Skin:      Neurological:     Mental Status: She is alert.     Results for orders placed or performed in visit on 05/30/23  POC COVID-19  Result Value Ref Range   SARS Coronavirus 2 Ag Negative Negative        Assessment & Plan:   Problem List Items Addressed This Visit       Other   Incomplete bladder emptying   Pending UA      Relevant Orders   POCT urinalysis dipstick   Hematoma   Status post suture removal the patient scratched had causing the bleed nontender and semifluctuant in office.  No signs of infection.  Wound is approximated.  Conservative treatment with cool compresses follow-up 1 week.  Patient is on blood thinners but has not grown since it started.      Chills - Primary   Ambiguous in nature.  No overt signs of an infectious process will check a UA.  Pending result      Relevant Orders   POC COVID-19 (Completed)    No orders of the defined types were placed in this encounter.   Return in about 1 week (around 06/06/2023) for lump recheck .  Audria Nine, NP

## 2023-05-30 NOTE — Assessment & Plan Note (Signed)
 Ambiguous in nature.  No overt signs of an infectious process will check a UA.  Pending result

## 2023-06-02 ENCOUNTER — Ambulatory Visit: Payer: Medicare HMO

## 2023-06-06 ENCOUNTER — Telehealth: Payer: Self-pay | Admitting: Emergency Medicine

## 2023-06-06 NOTE — Telephone Encounter (Signed)
-----   Message from Stacy Kirk sent at 06/06/2023  7:54 AM EDT ----- [name],   Will you please contact this patient and explain the importance of her echo. If she is more comfortable we can get her scheduled with Lafonda Mosses to perform her echo. We will likely need to move her follow up with me (scheduled for 3/14) so I can see her after her echo is performed and read.   Thank you!  DW

## 2023-06-06 NOTE — Telephone Encounter (Signed)
 Per DPR pt's husband contacted and pt is scheduled for Echo on 4/3 with appt with DW for 4/13  Letter removed from outgoing mailbox

## 2023-06-06 NOTE — Telephone Encounter (Signed)
 I called this pt every 5-10 min from 7:45 until 10:20.  Phone appears to be continuously busy.  I try again later today and send a letter  Letter will be sent

## 2023-06-07 ENCOUNTER — Ambulatory Visit: Admitting: Nurse Practitioner

## 2023-06-07 VITALS — BP 110/70 | HR 100 | Temp 98.1°F | Ht 60.0 in

## 2023-06-07 DIAGNOSIS — R8290 Unspecified abnormal findings in urine: Secondary | ICD-10-CM | POA: Diagnosis not present

## 2023-06-07 DIAGNOSIS — R0982 Postnasal drip: Secondary | ICD-10-CM | POA: Diagnosis not present

## 2023-06-07 DIAGNOSIS — R339 Retention of urine, unspecified: Secondary | ICD-10-CM | POA: Diagnosis not present

## 2023-06-07 DIAGNOSIS — R6883 Chills (without fever): Secondary | ICD-10-CM

## 2023-06-07 DIAGNOSIS — T148XXA Other injury of unspecified body region, initial encounter: Secondary | ICD-10-CM | POA: Diagnosis not present

## 2023-06-07 LAB — POC URINALSYSI DIPSTICK (AUTOMATED)
Bilirubin, UA: NEGATIVE
Blood, UA: NEGATIVE
Glucose, UA: NEGATIVE
Ketones, UA: NEGATIVE
Nitrite, UA: NEGATIVE
Protein, UA: POSITIVE — AB
Spec Grav, UA: 1.015 (ref 1.010–1.025)
Urobilinogen, UA: 0.2 U/dL
pH, UA: 5.5 (ref 5.0–8.0)

## 2023-06-07 MED ORDER — CEPHALEXIN 500 MG PO CAPS: 500.0000 mg | ORAL_CAPSULE | Freq: Two times a day (BID) | ORAL | 0 refills | Status: DC

## 2023-06-07 MED ORDER — FLUTICASONE PROPIONATE 50 MCG/ACT NA SUSP: 2.0000 | Freq: Every day | NASAL | 0 refills | Status: DC

## 2023-06-07 MED ORDER — CETIRIZINE HCL 10 MG PO TABS: 10.0000 mg | ORAL_TABLET | Freq: Every day | ORAL | 11 refills | Status: DC

## 2023-06-07 NOTE — Patient Instructions (Signed)
 Nice to see you today Follow up as needed or as scheduled

## 2023-06-07 NOTE — Assessment & Plan Note (Signed)
 Not progressing but not remitting hematoma to the left scalp.  Patient may need evacuation will refer to neurosurgery for further management

## 2023-06-07 NOTE — Assessment & Plan Note (Signed)
UA today

## 2023-06-07 NOTE — Assessment & Plan Note (Signed)
 UA in office

## 2023-06-07 NOTE — Assessment & Plan Note (Signed)
 Urine culture pending.  Treat with Keflex 500 mg twice daily

## 2023-06-07 NOTE — Progress Notes (Signed)
 Acute Office Visit  Subjective:     Patient ID: Stacy Kirk, female    DOB: February 16, 1946, 78 y.o.   MRN: [id number]  Chief Complaint  Patient presents with   Follow-up    Here with husband. Brought a urine sample in today. She states she has not had an issue with bladder emptying.  Has a cough that will not go away. States she has had it for a long time.     HPI Patient is in today for Chills/ hematoma.   Patient was seen by the ED on 05/06/2023 and had a f/u with me on02/14/2025 with suture removal. She followed up with me on 05/30/2023 with a hematoma. States that it has not changed in size. It is still itching and getting in the way. No bleeding or discharge   Chills/Cough: has been intermittent for a long time. States that she is having to cough heavy that will clear it but nothing comes out. States that she has tried nasal spray but has a hard time remembering it. She does mention that she feels like she keeps a sinus infection. Not currently interested in seeing ENT    Review of Systems  Constitutional:  Positive for chills and malaise/fatigue. Negative for fever.  HENT:  Negative for ear discharge, ear pain and sore throat.   Respiratory:  Positive for cough. Negative for shortness of breath.   Musculoskeletal:  Positive for back pain. Negative for joint pain and myalgias.        Objective:    BP 110/70 (BP Location: Right Arm, Patient Position: Sitting, Cuff Size: Normal)   Pulse 100   Temp 98.1 F (36.7 C) (Oral)   Ht 5' (1.524 m)   SpO2 98%   BMI 41.01 kg/m    Physical Exam Vitals and nursing note reviewed.  Constitutional:      Appearance: Normal appearance.  HENT:     Right Ear: Tympanic membrane, ear canal and external ear normal.     Left Ear: Tympanic membrane, ear canal and external ear normal.     Nose:     Right Sinus: No maxillary sinus tenderness or frontal sinus tenderness.     Left Sinus: Maxillary sinus tenderness and frontal sinus  tenderness present.     Mouth/Throat:     Mouth: Mucous membranes are moist.     Pharynx: Oropharynx is clear.  Cardiovascular:     Rate and Rhythm: Normal rate and regular rhythm.     Heart sounds: Normal heart sounds.  Pulmonary:     Effort: Pulmonary effort is normal.     Breath sounds: Normal breath sounds.  Abdominal:     General: Bowel sounds are normal. There is no distension.     Palpations: There is no mass.     Tenderness: There is no abdominal tenderness. There is no right CVA tenderness or left CVA tenderness.     Hernia: No hernia is present.  Neurological:     Mental Status: She is alert.     Results for orders placed or performed in visit on 06/07/23  POCT Urinalysis Dipstick (Automated)  Result Value Ref Range   Color, UA Yellow    Clarity, UA clear    Glucose, UA Negative Negative   Bilirubin, UA negative    Ketones, UA negative    Spec Grav, UA 1.015 1.010 - 1.025   Blood, UA negative    pH, UA 5.5 5.0 - 8.0   Protein, UA Positive (A)  Negative   Urobilinogen, UA 0.2 0.2 or 1.0 E.U./dL   Nitrite, UA negative    Leukocytes, UA Trace (A) Negative        Assessment & Plan:   Problem List Items Addressed This Visit       Other   Incomplete bladder emptying - Primary   UA in office      Relevant Orders   POCT Urinalysis Dipstick (Automated) (Completed)   Hematoma   Not progressing but not remitting hematoma to the left scalp.  Patient may need evacuation will refer to neurosurgery for further management      Relevant Orders   Ambulatory referral to General Surgery   Chills   UA today.      PND (post-nasal drip)   Will do fluticasone nasal spray 50 mcg per actuation 2 sprays each nostril daily along with second-generation histamine nightly.      Relevant Medications   fluticasone (FLONASE) 50 MCG/ACT nasal spray   cetirizine (ZYRTEC) 10 MG tablet   Abnormal urinalysis   Urine culture pending.  Treat with Keflex 500 mg twice daily       Relevant Orders   Urine Culture    Meds ordered this encounter  Medications   fluticasone (FLONASE) 50 MCG/ACT nasal spray    Sig: Place 2 sprays into both nostrils daily.    Dispense:  16 g    Refill:  0    Supervising Provider:   Roxy Manns A [[id number]]   cetirizine (ZYRTEC) 10 MG tablet    Sig: Take 1 tablet (10 mg total) by mouth daily.    Dispense:  30 tablet    Refill:  11    Supervising Provider:   Roxy Manns A [[id number]]   cephALEXin (KEFLEX) 500 MG capsule    Sig: Take 1 capsule (500 mg total) by mouth 2 (two) times daily.    Dispense:  14 capsule    Refill:  0    Supervising Provider:   Roxy Manns A [[id number]]    No follow-ups on file.  Audria Nine, NP

## 2023-06-07 NOTE — Assessment & Plan Note (Signed)
 Will do fluticasone nasal spray 50 mcg per actuation 2 sprays each nostril daily along with second-generation histamine nightly.

## 2023-06-08 LAB — URINE CULTURE
MICRO NUMBER:: 16192177
SPECIMEN QUALITY:: ADEQUATE

## 2023-06-09 ENCOUNTER — Ambulatory Visit: Payer: Medicare HMO | Admitting: Student

## 2023-06-16 ENCOUNTER — Encounter: Payer: Medicare HMO | Admitting: Cardiology

## 2023-06-20 ENCOUNTER — Ambulatory Visit (INDEPENDENT_AMBULATORY_CARE_PROVIDER_SITE_OTHER): Admitting: General Surgery

## 2023-06-20 ENCOUNTER — Encounter: Payer: Self-pay | Admitting: General Surgery

## 2023-06-20 VITALS — BP 127/84 | HR 97 | Ht 60.0 in | Wt 212.0 lb

## 2023-06-20 DIAGNOSIS — W19XXXA Unspecified fall, initial encounter: Secondary | ICD-10-CM | POA: Diagnosis not present

## 2023-06-20 DIAGNOSIS — S0003XA Contusion of scalp, initial encounter: Secondary | ICD-10-CM | POA: Diagnosis not present

## 2023-06-20 DIAGNOSIS — S0003XS Contusion of scalp, sequela: Secondary | ICD-10-CM

## 2023-06-20 NOTE — Progress Notes (Signed)
 Patient ID: Stacy Kirk, female   DOB: 1945-09-15, 78 y.o.   MRN: [id number] CC: Left Scalp Hematoma History of Present Illness Stacy Kirk is a 78 y.o. female with past medical history significant for PE who is currently on Eliquis who presents in consultation for a left scalp hematoma.  The patient reports that several weeks ago she fell hitting her head.  She went to the emergency department and had a workup including a CT scan.  She had a laceration to her left scalp that was repaired with sutures.  She had an appointment with her primary care doctor and had the sutures removed.  Since then she had noticed some swelling and a mass on the left side of her scalp.  She reports that her main symptom from this is pruritus.  She says the pruritus is intense and she has not tried to scratch it because she is worried that when she scratches it it will bleed.  She is not using any topical ointments on the scalp nor is she using any compression to help reabsorb the hematoma.  She denies any pain from this and denies any neurologic changes.  Past Medical History Past Medical History:  Diagnosis Date   Arthritis    Cancer (HCC)    ovarian, skin face- skin cancer   Complication of anesthesia    slow to awaken everday, even slower with anesthesia   Constipation    Diabetes mellitus    Type II   GERD (gastroesophageal reflux disease)    Headache 05/12/2015   "after a fall" headache for 8 months   Hemorrhoids    Hernia    History of kidney stones    passed   Hypertension    Hypothyroidism    Loss of appetite    Muscle spasm    back   Osteoporosis    Redness    abd wound   Urine frequency    unable to hold urine    Wears hearing aid    bilateral        Past Surgical History:  Procedure Laterality Date   ABDOMINAL HYSTERECTOMY     APPENDECTOMY     CHOLECYSTECTOMY     COLONOSCOPY W/ POLYPECTOMY     HERNIA REPAIR  Jun2012   open component separation w biologic mesh (Dr.  Luisa Hart)   TOE SURGERY Left    2nd toe   TONSILLECTOMY      Allergies  Allergen Reactions   Erythromycin Diarrhea and Other (See Comments)    "Tears" her stomach up. Major digestive upset.   Tramadol Shortness Of Breath and Other (See Comments)    Felt like lungs filled up; unable to lay down   Albumin (Human) Other (See Comments)    Pt unsure of reaction   Aspirin Itching and Other (See Comments)    Caused lethargy, also   Codeine Itching and Other (See Comments)    Cause lethargy, also   Cortisone Other (See Comments)    NO STEROID INJECTIONS; headaches   Linzess [Linaclotide] Diarrhea   Propoxyphene Other (See Comments)    Terrible reaction; headaches   Simvastatin Itching and Other (See Comments)    Severe muscle aches   Triamcinolone Acetonide Other (See Comments)    headache    Current Outpatient Medications  Medication Sig Dispense Refill   acetaminophen (TYLENOL) 500 MG tablet Take 1,000 mg by mouth every 6 (six) hours as needed (FOR PAIN.).     cetirizine (ZYRTEC) 10 MG  tablet Take 1 tablet (10 mg total) by mouth daily. 30 tablet 11   ELIQUIS 5 MG TABS tablet TAKE 1 TABLET BY MOUTH TWICE A DAY 60 tablet 3   fluticasone (FLONASE) 50 MCG/ACT nasal spray Place 2 sprays into both nostrils daily. (Patient taking differently: Place 2 sprays into both nostrils as needed.) 16 g 0   levothyroxine (SYNTHROID) 100 MCG tablet TAKE 1 TABLET BY MOUTH EVERY DAY 90 tablet 1   metFORMIN (GLUCOPHAGE) 500 MG tablet TAKE 1 TABLET (500 MG TOTAL) BY MOUTH DAILY AFTER SUPPER. 90 tablet 1   metoprolol succinate (TOPROL-XL) 25 MG 24 hr tablet Take 1 tablet (25 mg total) by mouth daily. 90 tablet 2   pantoprazole (PROTONIX) 40 MG tablet TAKE 1 TABLET BY MOUTH EVERY DAY 90 tablet 1   spironolactone (ALDACTONE) 25 MG tablet TAKE 1 TABLET (25 MG TOTAL) BY MOUTH DAILY. 90 tablet 0   torsemide (DEMADEX) 20 MG tablet Take 2 tablets (40 mg total) by mouth daily. 180 tablet 0   No current  facility-administered medications for this visit.    Family History Family History  Problem Relation Age of Onset   Diabetes Mother    Early death Sister    Drug abuse Sister        Social History Social History   Tobacco Use   Smoking status: Former    Current packs/day: 0.00    Types: Cigarettes    Start date: 03/29/1983    Quit date: 03/28/1998    Years since quitting: 25.2    Passive exposure: Past   Smokeless tobacco: Never   Tobacco comments:    quit 2002  Vaping Use   Vaping status: Never Used  Substance Use Topics   Alcohol use: No   Drug use: No        ROS Full ROS of systems performed and is otherwise negative there than what is stated in the HPI  Physical Exam Blood pressure 127/84, pulse 97, height 5' (1.524 m), weight 212 lb (96.2 kg), SpO2 98%.  Alert and oriented x 3, normal work of breathing on room air, regular rate and rhythm, left scalp there is a soft area of swelling with overlying healing skin.  At the posterior edge of this there is some scabbing but there is no active bleeding.  The swelling is not mobile and with compression of it there is minimal pain.  Data Reviewed CT scan reviewed and there is a left scalp hematoma without any intracranial bleeding.  I also reviewed her notes and she is at the soft tissue swelling that has been more prominent since her sutures were removed.  I have personally reviewed the patient's imaging and medical records.    Assessment/Plan    78 year old female with a past medical history significant for pulmonary embolism who is on anticoagulation of Eliquis who presents with left scalp swelling likely a hematoma secondary to a fall several weeks ago.  Her main complaint is pruritus.  I discussed with her my concern about evacuating this is that this is a very well-vascularized area and she is on anticoagulation.  I would like to see over the next 4 to 6 weeks if there is any resorption of the hematoma.  Recommended  warm compress twice a day to try to help with resorption.  We will also recommend an over-the-counter Benadryl cream to the area to help reduce the pruritus.  I do not want to prescribe her any hydrocortisone cream as I do  not want thinning of the skin over this.  I also discussed with her that if she does not have resolution of the hematoma she may need a neurosurgeon as that will likely require some kind of undermining of the tissue to get adequate closure.    Kandis Cocking 06/20/2023, 1:44 PM

## 2023-06-20 NOTE — Patient Instructions (Addendum)
 We would like for you to use some over the counter Benadryl anti itch cream. Use this 3 times a day to the area.  Use warm heat to the area with some compression for 10 minutes twice a day.   Follow up here in 6 weeks.   Hematoma A hematoma is a collection of blood. A hematoma can happen: Under the skin. In an organ. In a body space. In a joint space. In other tissues. The blood can thicken (clot) to form a lump that you can see and feel. The lump is often hard and may become sore and tender. The lump can be very small or very big. Most hematomas get better in a few days to weeks. However, some of these may be serious and need medical care. What are the causes? This condition is caused by: An injury. Blood that leaks under the skin. Problems from surgeries. Medical conditions that cause bleeding or bruising. What increases the risk? You are more likely to develop this condition if: You are an older adult. You use medicines that thin your blood. You use NSAIDs, such as ibuprofen, often for pain. You play contact sports. What are the signs or symptoms? Symptoms depend on where the hematoma is in your body. If the hematoma is under the skin, there is: A firm lump on the body. Pain and tenderness in the area. Bruising. The skin above the lump may be blue, dark blue, purple-red, or yellowish. If the hematoma is deep in the tissues or body spaces, there may be: Blood in the stomach. This may cause pain in the belly (abdomen), weakness, passing out (fainting), and shortness of breath. Blood in the head. This may cause a headache, weakness, trouble speaking or understanding speech, or passing out. How is this treated? Treatment depends on the cause, size, and location of the hematoma. Treatment may include: Doing nothing. Many hematomas go away on their own without treatment. Surgery or close monitoring. This may be needed for large hematomas or hematomas that affect the body's  organs. Medicines. These may be given if a medical condition caused the hematoma. Follow these instructions at home: Use a warm compress to the area twice a day with some compression for 10 minutes. Use the heat source that your doctor recommends, such as a moist heat pack or a heating pad. Place a towel between your skin and the heat source. Leave the heat on for 10 minutes. If your skin turns bright red, take off the heat right away to prevent burns. The risk of burns is higher if you cannot feel pain, heat, or cold. Raise the injured area above the level of your heart while you are sitting or lying down. Wrap the affected area with an elastic bandage, if told by your doctor. Do not wrap the bandage too tight. If your hematoma is on a leg or foot and is painful, your doctor may give you crutches. Use them as told by your doctor. General instructions Take over-the-counter and prescription medicines only as told by your doctor. Keep all follow-up visits. Your doctor may want to see how your hematoma is healing with treatment. Contact a doctor if: You have a fever. The swelling or bruising gets worse. You start to get more hematomas. Your pain gets worse. Your pain is not getting better with medicine. The skin over the hematoma breaks or starts to bleed. Get help right away if: Your hematoma is in your chest or belly and you: Pass out. Feel  weak. Become short of breath. You have a hematoma on your scalp that is caused by a fall or injury, and you: Have a headache that gets worse. Have trouble speaking or understanding speech. Become less alert or you pass out. These symptoms may be an emergency. Get help right away. Call 911. Do not wait to see if the symptoms will go away. Do not drive yourself to the hospital This information is not intended to replace advice given to you by your health care provider. Make sure you discuss any questions you have with your health care  provider. Document Revised: 09/06/2021 Document Reviewed: 09/06/2021 Elsevier Patient Education  2024 [employer].

## 2023-06-29 ENCOUNTER — Ambulatory Visit

## 2023-06-30 ENCOUNTER — Ambulatory Visit (INDEPENDENT_AMBULATORY_CARE_PROVIDER_SITE_OTHER): Admitting: Nurse Practitioner

## 2023-06-30 ENCOUNTER — Ambulatory Visit (INDEPENDENT_AMBULATORY_CARE_PROVIDER_SITE_OTHER)
Admission: RE | Admit: 2023-06-30 | Discharge: 2023-06-30 | Disposition: A | Discharge status: Home / Self Care | Source: Ambulatory Visit | Attending: Nurse Practitioner | Admitting: Nurse Practitioner

## 2023-06-30 VITALS — BP 98/70 | HR 99 | Temp 97.9°F | Ht 60.0 in

## 2023-06-30 DIAGNOSIS — R7989 Other specified abnormal findings of blood chemistry: Secondary | ICD-10-CM | POA: Diagnosis not present

## 2023-06-30 DIAGNOSIS — R051 Acute cough: Secondary | ICD-10-CM | POA: Diagnosis not present

## 2023-06-30 DIAGNOSIS — R0689 Other abnormalities of breathing: Secondary | ICD-10-CM

## 2023-06-30 DIAGNOSIS — J0140 Acute pansinusitis, unspecified: Secondary | ICD-10-CM | POA: Diagnosis not present

## 2023-06-30 DIAGNOSIS — R0989 Other specified symptoms and signs involving the circulatory and respiratory systems: Secondary | ICD-10-CM | POA: Diagnosis not present

## 2023-06-30 DIAGNOSIS — R918 Other nonspecific abnormal finding of lung field: Secondary | ICD-10-CM | POA: Diagnosis not present

## 2023-06-30 MED ORDER — AMOXICILLIN-POT CLAVULANATE 875-125 MG PO TABS: 1.0000 | ORAL_TABLET | Freq: Two times a day (BID) | ORAL | 0 refills | Status: AC

## 2023-06-30 NOTE — Progress Notes (Signed)
 Acute Office Visit  Subjective:     Patient ID: Stacy Kirk, female    DOB: [birth year], 78 y.o.   MRN: [id number]  Chief Complaint  Patient presents with   Choking    Pt complains of coughing up a lot of phelm yesterday morning and afternoon. States that she has not been able to keep down medication or food. Pt complains of not being able to stay awake and incoherent.      HPI Patient is in today for choking with a history of HTN, CHG, PE, GI bleed, Dm2, OAB, compression fracture   States that she has been having chills, not feeling well for sometimes. Statse that she was having some much phlegm yesterday morning that she could not swallow food or fluids. She has been able to eat some pound cake and get her medication down  States that she is not able to sleep lying down but that is not a recent change that has been since she was discharged from rehab. She is having difficulty with day and night as she is wanting to sleep more per her spouses report. She is awake, orientated and appropriate in office.  Patient spouse mentions that the patient has been sleeping for probably the past week.  Does mention that she will sleep overnight but watches TV the other part of the night.  States that he may have breakfast for this morning and within 5 minutes patient had dozed back off.  She is easily arousable but then gets mad in for waking her up.  Review of Systems  Constitutional:  Positive for chills and malaise/fatigue. Negative for fever.  Respiratory:  Positive for cough and sputum production (clear and thick).   Gastrointestinal:  Negative for constipation, diarrhea, nausea and vomiting.       Bm yesterday with   Genitourinary:  Negative for dysuria and frequency.  Neurological:  Positive for dizziness and headaches.        Objective:    BP 98/70   Pulse 99   Temp 97.9 F (36.6 C) (Oral)   Ht 5' (1.524 m)   SpO2 90%   BMI 41.40 kg/m  BP Readings from Last 3 Encounters:   06/30/23 98/70  06/20/23 127/84  06/07/23 110/70   Wt Readings from Last 3 Encounters:  06/20/23 212 lb (96.2 kg)  05/30/23 210 lb (95.3 kg)  05/12/23 210 lb (95.3 kg)   SpO2 Readings from Last 3 Encounters:  06/30/23 90%  06/20/23 98%  06/07/23 98%      Physical Exam Vitals and nursing note reviewed.  Constitutional:      Appearance: Normal appearance.  HENT:     Mouth/Throat:     Mouth: Mucous membranes are moist.     Pharynx: Oropharynx is clear.  Eyes:     Pupils: Pupils are equal, round, and reactive to light.  Cardiovascular:     Rate and Rhythm: Normal rate and regular rhythm.     Heart sounds: Normal heart sounds.  Pulmonary:     Effort: Pulmonary effort is normal.     Breath sounds: Rales (RLL) present.  Abdominal:     General: Bowel sounds are normal. There is no distension.     Palpations: There is no mass.     Tenderness: There is no abdominal tenderness.     Hernia: No hernia is present.  Musculoskeletal:     Right lower leg: Edema present.     Left lower leg: Edema present.  Lymphadenopathy:  Cervical: No cervical adenopathy.  Neurological:     Mental Status: She is alert. Mental status is at baseline.     Comments: Bilateral upper and lower extremity strength 5/5     No results found for any visits on 06/30/23.      Assessment & Plan:   Problem List Items Addressed This Visit       Respiratory   Acute non-recurrent pansinusitis   Will treat with Augmentin 875-125 mg twice daily for 7 days.  Patient may benefit from ENT consult.      Relevant Medications   amoxicillin-clavulanate (AUGMENTIN) 875-125 MG tablet   Other Relevant Orders   Basic metabolic panel with GFR   CBC with Differential/Platelet     Other   Adventitious breath sounds   Pending stat chest x-ray will cover patient for pneumonia with Augmentin.  Patient also has lower O2 sats strict return precautions and ED precautions given      Acute cough - Primary    Pending chest x-ray.  ED precautions given      Relevant Orders   DG Chest 2 View   Basic metabolic panel with GFR   CBC with Differential/Platelet    Meds ordered this encounter  Medications   amoxicillin-clavulanate (AUGMENTIN) 875-125 MG tablet    Sig: Take 1 tablet by mouth 2 (two) times daily for 7 days.    Dispense:  14 tablet    Refill:  0    Supervising Provider:   Roxy Manns A [[id number]]    Return if symptoms worsen or fail to improve, for as scheduled .  Audria Nine, NP

## 2023-06-30 NOTE — Assessment & Plan Note (Signed)
 Pending stat chest x-ray will cover patient for pneumonia with Augmentin.  Patient also has lower O2 sats strict return precautions and ED precautions given

## 2023-06-30 NOTE — Assessment & Plan Note (Signed)
 Pending chest x-ray.  ED precautions given

## 2023-06-30 NOTE — Assessment & Plan Note (Signed)
 Will treat with Augmentin 875-125 mg twice daily for 7 days.  Patient may benefit from ENT consult.

## 2023-06-30 NOTE — Patient Instructions (Signed)
 Nice to see you today I will be in touch with the labs and xray once I have it If you get more sleepy, start feeling worse, or cannot keep food or fluid down go to the emergency department over the weekend.

## 2023-07-01 ENCOUNTER — Other Ambulatory Visit: Payer: Self-pay | Admitting: Nurse Practitioner

## 2023-07-01 DIAGNOSIS — R0982 Postnasal drip: Secondary | ICD-10-CM

## 2023-07-03 ENCOUNTER — Telehealth: Payer: Self-pay | Admitting: Nurse Practitioner

## 2023-07-03 ENCOUNTER — Other Ambulatory Visit: Payer: Self-pay | Admitting: Nurse Practitioner

## 2023-07-03 DIAGNOSIS — R7989 Other specified abnormal findings of blood chemistry: Secondary | ICD-10-CM

## 2023-07-03 NOTE — Telephone Encounter (Signed)
 The diarrhea is a common side effect and should resolve when she stops taking the medication. She can try and take it with food and see if that helps.   We can mail the IFOB. She will need to wait until the diarrhea passes to complete the test

## 2023-07-03 NOTE — Telephone Encounter (Signed)
-----   Message from [facility] T sent at 07/03/2023  4:10 PM EDT ----- Called patient reviewed all information and repeated back to me.  Spoke with howard on dpr.  [name] would like to know why the amoxicillin is causing Stacy Kirk to have diarrhea? States that it started last night and would like to know if that is a normal side effect. Will mail iFOB test to Stacy Kirk address on file.

## 2023-07-04 ENCOUNTER — Other Ambulatory Visit: Payer: Self-pay | Admitting: Nurse Practitioner

## 2023-07-04 DIAGNOSIS — E611 Iron deficiency: Secondary | ICD-10-CM

## 2023-07-04 LAB — CBC WITH DIFFERENTIAL/PLATELET
Absolute Lymphocytes: 2596 {cells}/uL (ref 850–3900)
Absolute Monocytes: 836 {cells}/uL (ref 200–950)
Basophils Absolute: 62 {cells}/uL (ref 0–200)
Basophils Relative: 0.7 %
Eosinophils Absolute: 361 {cells}/uL (ref 15–500)
Eosinophils Relative: 4.1 %
HCT: 34.6 % — ABNORMAL LOW (ref 35.0–45.0)
Hemoglobin: 10.8 g/dL — ABNORMAL LOW (ref 11.7–15.5)
MCH: 25.5 pg — ABNORMAL LOW (ref 27.0–33.0)
MCHC: 31.2 g/dL — ABNORMAL LOW (ref 32.0–36.0)
MCV: 81.8 fL (ref 80.0–100.0)
MPV: 9.5 fL (ref 7.5–12.5)
Monocytes Relative: 9.5 %
Neutro Abs: 4946 {cells}/uL (ref 1500–7800)
Neutrophils Relative %: 56.2 %
Platelets: 392 10*3/uL (ref 140–400)
RBC: 4.23 10*6/uL (ref 3.80–5.10)
RDW: 14.5 % (ref 11.0–15.0)
Total Lymphocyte: 29.5 %
WBC: 8.8 10*3/uL (ref 3.8–10.8)

## 2023-07-04 LAB — IRON,TIBC AND FERRITIN PANEL
%SAT: 4 % — ABNORMAL LOW (ref 16–45)
Ferritin: 11 ng/mL — ABNORMAL LOW (ref 16–288)
Iron: 22 ug/dL — ABNORMAL LOW (ref 45–160)
TIBC: 506 ug/dL — ABNORMAL HIGH (ref 250–450)

## 2023-07-04 LAB — BASIC METABOLIC PANEL WITH GFR
BUN/Creatinine Ratio: 17 (calc) (ref 6–22)
BUN: 32 mg/dL — ABNORMAL HIGH (ref 7–25)
CO2: 34 mmol/L — ABNORMAL HIGH (ref 20–32)
Calcium: 10.1 mg/dL (ref 8.6–10.4)
Chloride: 89 mmol/L — ABNORMAL LOW (ref 98–110)
Creat: 1.93 mg/dL — ABNORMAL HIGH (ref 0.60–0.95)
Glucose, Bld: 106 mg/dL — ABNORMAL HIGH (ref 65–99)
Potassium: 3.6 mmol/L (ref 3.5–5.3)
Sodium: 138 mmol/L (ref 135–146)
eGFR: 26 mL/min/{1.73_m2} — ABNORMAL LOW (ref >=60)

## 2023-07-04 LAB — TEST AUTHORIZATION

## 2023-07-04 MED ORDER — IRON (FERROUS SULFATE) 325 (65 FE) MG PO TABS: 325.0000 mg | ORAL_TABLET | Freq: Every day | ORAL | 1 refills | Status: DC

## 2023-07-04 NOTE — Telephone Encounter (Signed)
 Contacted pt and spoke with [name] Villarin on dpr.  Relayed advice to howard who has no questions or concerns.  Relayed iron level results and informed pt of mailed iFOB test and iron pills sent into pharmacy on file.

## 2023-07-07 ENCOUNTER — Ambulatory Visit: Admitting: Student

## 2023-07-07 ENCOUNTER — Ambulatory Visit: Payer: Medicare HMO | Admitting: Nurse Practitioner

## 2023-07-13 ENCOUNTER — Ambulatory Visit: Payer: Self-pay | Admitting: *Deleted

## 2023-07-13 NOTE — Telephone Encounter (Signed)
 Noted. Patient needs to see ENT at this point. We have them in [place] or [place]

## 2023-07-13 NOTE — Telephone Encounter (Signed)
 Patient's husband,[name] is calling: Chief Complaint: continuing symptoms: nasal drainage with blood tinged mucus, cough, R eye watering/irritation  Symptoms: see above, Patient states she is better- just has some lingering symptoms.  Frequency: 06/30/23-OV- has finished antibiotic- husband wonders if patient needs another medication.  Pertinent Negatives: Patient denies fever,pain Disposition: [] ED /[] Urgent Care (no appt availability in office) / [] Appointment(In office/virtual)/ []  [place] [facility]/ [] Home Care/ [] Refused Recommended Disposition /[] [place] [facility]/ [x]  Follow-up with PCP Additional Notes: No open OV within disposition- patient unable to come to office today- and office closed for holiday tomorrow- advised UC if symptoms continue to get worse. Offered first available appointment Monday- but patient declines- has appointment scheduled Friday. Patient's husband advised UC for change in symptoms over next few days- will send message to PCP for review and recommendation.    Copied from CRM [id number]. Topic: Clinical - Red Word Triage >> Jul 13, 2023  1:24 PM Rosamond Comes wrote: Red Word that prompted transfer to Nurse Triage: patient husband Gwen Lek stating patient had sinus infection patient has blood in mucous when blowing nose. Reason for Disposition  [1] Taking antibiotic > 7 days AND [2] nasal discharge not improved  Answer Assessment - Initial Assessment Questions 1. ANTIBIOTIC: "What antibiotic are you taking?" "How many times a day?"     Amoxicillin  2. ONSET: "When was the antibiotic started?"     06/30/23- finished 2-3 days ago 3. PAIN: "How bad is the sinus pain?"   (Scale 1-10; mild, moderate or severe)   - MILD (1-3): doesn't interfere with normal activities    - MODERATE (4-7): interferes with normal activities (e.g., work or school) or awakens from sleep   - SEVERE (8-10): excruciating pain and patient unable to do any normal activities        No pain 4.  FEVER: "Do you have a fever?" If Yes, ask: "What is it, how was it measured, and when did it start?"      no 5. SYMPTOMS: "Are there any other symptoms you're concerned about?" If Yes, ask: "When did it start?"     Cough, R eye watering/tearing, nasal drainage - blood in mucus- good amount  Protocols used: Sinus Infection on Antibiotic Follow-up Call-A-AH

## 2023-07-17 NOTE — Telephone Encounter (Signed)
 Tried to contact pt and the message states "Your call cannot be completed as dialed". Will try again tomorrow.

## 2023-07-18 NOTE — Telephone Encounter (Signed)
 Called and spoke to patient and husband they would like to keep follow up to review all options with Matt at that time.

## 2023-07-21 ENCOUNTER — Ambulatory Visit: Admitting: Nurse Practitioner

## 2023-07-26 ENCOUNTER — Ambulatory Visit: Attending: Cardiology

## 2023-07-26 DIAGNOSIS — I5020 Unspecified systolic (congestive) heart failure: Secondary | ICD-10-CM | POA: Diagnosis not present

## 2023-07-26 LAB — ECHOCARDIOGRAM COMPLETE

## 2023-07-27 ENCOUNTER — Ambulatory Visit: Admitting: General Surgery

## 2023-07-27 ENCOUNTER — Other Ambulatory Visit: Payer: Self-pay | Admitting: Nurse Practitioner

## 2023-07-27 DIAGNOSIS — E611 Iron deficiency: Secondary | ICD-10-CM

## 2023-07-28 ENCOUNTER — Ambulatory Visit: Admitting: Family Medicine

## 2023-07-28 NOTE — Progress Notes (Deleted)
 Cardiology Clinic Note   Date: 07/28/2023 ID: AMOUR TULLER, DOB [birth year], MRN [id number]  Primary Cardiologist:  Constancia Delton, MD  Chief Complaint   Stacy Kirk is a 78 y.o. female who presents to the clinic today for ***  Patient Profile   Stacy Kirk is followed by *** for the history outlined below.      Past medical history significant for: Chronic HFmrEF. Echo 10/04/2022: EF 40 to 45%.  Global hypokinesis.  Moderate concentric LVH.  Grade I DD.  Mildly reduced RV function, normal RV size.  Mild LAE.  Moderate MAC.  Mild calcification, moderate thickening of aortic valve.  Moderate aortic valve stenosis, mean gradient 12 mmHg.  Borderline dilatation of ascending aorta 38 mm. Echo 07/26/2023: EF 50 to 55%.  No RWMA.  Moderate LVH.  Indeterminate diastolic parameters.  Normal RV size/function.  Aortic valve was not assessed.  Exam start prematurely by patient. Hypertension. PE/DVT. Hypothyroidism. T2DM. GI bleed.  In summary, patient has a history of PE/DVT.  She underwent hospital admission in July 2024 for mechanical fall and CT revealed right lower lobe PE and right lower extremity DVT.  Echo at that time demonstrated EF 40 to 45% and moderate aortic stenosis detailed above.  Patient was first evaluated by Dr. Junnie Olives on 05/05/2023 to establish care.  She reported lower extremity edema and orthopnea.  She was sleeping in a recliner.  Daily Lasix  was not improving edema.  She was referred to the advanced heart failure clinic and repeat echo was ordered.  Repeat echo demonstrated improved LV function as detailed above.     History of Present Illness    Today, patient ***  Chronic HFmrEF/moderate aortic stenosis Echo in July 2024 demonstrated EF 40 to 45% and moderate aortic stenosis.  Repeat echo in April 2025 was stopped prematurely by patient but revealed EF 50 to 55% with moderate LVH, aortic valve was not assessed.  Patient*** Euvolemic and well  compensated on exam. - Continue torsemide , spironolactone , Toprol . - Keep follow-up with Dr. Julane Ny on 6/6.  Hypertension BP today*** - Continue spironolactone , Toprol .  PE/DVT PE and right lower extremity DVT found July 2024.  Repeat vascular ultrasound of lower extremities was negative for DVT.  Denies spontaneous bleeding concerns.  Patient*** - Continue Eliquis  as managed by PCP.  ROS: All other systems reviewed and are otherwise negative except as noted in History of Present Illness.  EKGs/Labs Reviewed        04/14/2023: ALT 12; AST 14 06/30/2023: BUN 32; Creat 1.93; Potassium 3.6; Sodium 138   06/30/2023: Hemoglobin 10.8; WBC 8.8   04/14/2023: TSH 2.02   04/14/2023: Brain Natriuretic Peptide CANCELED 05/12/2023: Pro B Natriuretic peptide (BNP) 33.0  ***  Risk Assessment/Calculations    {Does this patient have ATRIAL FIBRILLATION?:[phone]} No BP recorded.  {Refresh Note OR Click here to enter BP  :1}***        Physical Exam    VS:  There were no vitals taken for this visit. , BMI There is no height or weight on file to calculate BMI.  GEN: Well nourished, well developed, in no acute distress. Neck: No JVD or carotid bruits. Cardiac: *** RRR. No murmurs. No rubs or gallops.   Respiratory:  Respirations regular and unlabored. Clear to auscultation without rales, wheezing or rhonchi. GI: Soft, nontender, nondistended. Extremities: Radials/DP/PT 2+ and equal bilaterally. No clubbing or cyanosis. No edema ***  Skin: Warm and dry, no rash. Neuro: Strength intact.  Assessment &  Plan   ***  Disposition: ***     {Are you ordering a CV Procedure (e.g. stress test, cath, DCCV, TEE, etc)?   Press F2        :[id number]}   Signed, Lonell Rives. Shriley Joffe, DNP, NP-C

## 2023-07-31 ENCOUNTER — Ambulatory Visit: Admitting: Student

## 2023-08-08 ENCOUNTER — Ambulatory Visit: Admitting: General Surgery

## 2023-08-12 NOTE — Progress Notes (Deleted)
 Cardiology Clinic Note   Date: 08/12/2023 ID: Stacy Kirk, DOB 08/31/1945, MRN [id number]  Primary Cardiologist:  Constancia Delton, MD  Chief Complaint   Stacy Kirk is a 78 y.o. female who presents to the clinic today for ***  Patient Profile   Stacy Kirk is followed by *** for the history outlined below.      Past medical history significant for: Chronic HFmrEF. Echo 10/04/2022: EF 40 to 45%.  Global hypokinesis.  Moderate concentric LVH.  Grade I DD.  Mildly reduced RV function, normal RV size.  Mild LAE.  Moderate MAC.  Mild calcification, moderate thickening of aortic valve.  Moderate aortic valve stenosis, mean gradient 12 mmHg.  Borderline dilatation of ascending aorta 38 mm. Echo 07/26/2023: EF 50 to 55%.  No RWMA.  Moderate LVH.  Indeterminate diastolic parameters.  Normal RV size/function.  Aortic valve was not assessed.  Exam start prematurely by patient. Hypertension. PE/DVT. Hypothyroidism. T2DM. GI bleed.  In summary, patient has a history of PE/DVT.  She underwent hospital admission in July 2024 for mechanical fall and CT revealed right lower lobe PE and right lower extremity DVT.  Echo at that time demonstrated EF 40 to 45% and moderate aortic stenosis detailed above.  Patient was first evaluated by Dr. Junnie Olives on 05/05/2023 to establish care.  She reported lower extremity edema and orthopnea.  She was sleeping in a recliner.  Daily Lasix  was not improving edema.  She was referred to the advanced heart failure clinic and repeat echo was ordered.  Repeat echo demonstrated improved LV function as detailed above.     History of Present Illness    Today, patient ***  Chronic HFmrEF/moderate aortic stenosis Echo in July 2024 demonstrated EF 40 to 45% and moderate aortic stenosis.  Repeat echo in April 2025 was stopped prematurely by patient but revealed EF 50 to 55% with moderate LVH, aortic valve was not assessed.  Patient*** Euvolemic and well  compensated on exam. - Continue torsemide , spironolactone , Toprol . - Keep follow-up with Dr. Julane Ny on 6/6.  Hypertension BP today*** - Continue spironolactone , Toprol .  PE/DVT PE and right lower extremity DVT found July 2024.  Repeat vascular ultrasound of lower extremities was negative for DVT.  Denies spontaneous bleeding concerns.  Patient*** - Continue Eliquis  as managed by PCP.  ROS: All other systems reviewed and are otherwise negative except as noted in History of Present Illness.  EKGs/Labs Reviewed        04/14/2023: ALT 12; AST 14 06/30/2023: BUN 32; Creat 1.93; Potassium 3.6; Sodium 138   06/30/2023: Hemoglobin 10.8; WBC 8.8   04/14/2023: TSH 2.02   04/14/2023: Brain Natriuretic Peptide CANCELED 05/12/2023: Pro B Natriuretic peptide (BNP) 33.0  ***  Risk Assessment/Calculations    {Does this patient have ATRIAL FIBRILLATION?:[id number]} No BP recorded.  {Refresh Note OR Click here to enter BP  :1}***        Physical Exam    VS:  There were no vitals taken for this visit. , BMI There is no height or weight on file to calculate BMI.  GEN: Well nourished, well developed, in no acute distress. Neck: No JVD or carotid bruits. Cardiac: *** RRR. No murmurs. No rubs or gallops.   Respiratory:  Respirations regular and unlabored. Clear to auscultation without rales, wheezing or rhonchi. GI: Soft, nontender, nondistended. Extremities: Radials/DP/PT 2+ and equal bilaterally. No clubbing or cyanosis. No edema ***  Skin: Warm and dry, no rash. Neuro: Strength intact.  Assessment &  Plan   ***  Disposition: ***     {Are you ordering a CV Procedure (e.g. stress test, cath, DCCV, TEE, etc)?   Press F2        :[id number]}   Signed, Lonell Rives. Camara Renstrom, DNP, NP-C

## 2023-08-17 ENCOUNTER — Ambulatory Visit (INDEPENDENT_AMBULATORY_CARE_PROVIDER_SITE_OTHER): Admitting: General Surgery

## 2023-08-17 ENCOUNTER — Encounter: Payer: Self-pay | Admitting: General Surgery

## 2023-08-17 VITALS — BP 111/79 | HR 100 | Ht 60.0 in | Wt 212.0 lb

## 2023-08-17 DIAGNOSIS — S0003XS Contusion of scalp, sequela: Secondary | ICD-10-CM

## 2023-08-17 DIAGNOSIS — W19XXXD Unspecified fall, subsequent encounter: Secondary | ICD-10-CM | POA: Diagnosis not present

## 2023-08-17 DIAGNOSIS — S0003XD Contusion of scalp, subsequent encounter: Secondary | ICD-10-CM | POA: Diagnosis not present

## 2023-08-17 NOTE — Progress Notes (Signed)
 Outpatient Surgical Follow Up  08/17/2023  Stacy Kirk is an 78 y.o. female.   Chief Complaint  Patient presents with   Follow-up    HPI: The patient returns today in follow-up.  She had a left scalp hematoma.  At our last visit I instructed her to apply pressure to this if it absorb.  She reports that the size of it has reduced significantly.  She says that there has been a punctate opening with some drainage of liquefied blood.  She is not having any pain over it.  She denies any purulent drainage.  She says the swelling has improved significant.  Past Medical History:  Diagnosis Date   Arthritis    Cancer (HCC)    ovarian, skin face- skin cancer   Complication of anesthesia    slow to awaken everday, even slower with anesthesia   Constipation    Diabetes mellitus    Type II   GERD (gastroesophageal reflux disease)    Headache 05/12/2015   "after a fall" headache for 8 months   Hemorrhoids    Hernia    History of kidney stones    passed   Hypertension    Hypothyroidism    Loss of appetite    Muscle spasm    back   Osteoporosis    Redness    abd wound   Urine frequency    unable to hold urine    Wears hearing aid    bilateral     Past Surgical History:  Procedure Laterality Date   ABDOMINAL HYSTERECTOMY     APPENDECTOMY     CHOLECYSTECTOMY     COLONOSCOPY W/ POLYPECTOMY     HERNIA REPAIR  Jun2012   open component separation w biologic mesh (Dr. Afton Kirk)   TOE SURGERY Left    2nd toe   TONSILLECTOMY      Family History  Problem Relation Age of Onset   Diabetes Mother    Early death Sister    Drug abuse Sister     Social History:  reports that she quit smoking about 25 years ago. Her smoking use included cigarettes. She started smoking about 40 years ago. She has been exposed to tobacco smoke. She has never used smokeless tobacco. She reports that she does not drink alcohol and does not use drugs.  Allergies:  Allergies  Allergen Reactions    Erythromycin Diarrhea and Other (See Comments)    "Tears" her stomach up. Major digestive upset.   Tramadol Shortness Of Breath and Other (See Comments)    Felt like lungs filled up; unable to lay down   Albumin (Human) Other (See Comments)    Pt unsure of reaction   Aspirin  Itching and Other (See Comments)    Caused lethargy, also   Codeine Itching and Other (See Comments)    Cause lethargy, also   Cortisone Other (See Comments)    NO STEROID INJECTIONS; headaches   Linzess  [Linaclotide ] Diarrhea   Propoxyphene Other (See Comments)    Terrible reaction; headaches   Simvastatin Itching and Other (See Comments)    Severe muscle aches   Triamcinolone Acetonide Other (See Comments)    headache    Medications reviewed.    ROS Full ROS performed and is otherwise negative other than what is stated in HPI   BP 111/79   Pulse 100   Ht 5' (1.524 m)   Wt 212 lb (96.2 kg)   SpO2 92%   BMI 41.40 kg/m  Physical Exam Left scalp there is a area of punctate opening that I was able to express hematoma out of.  The area is much smaller than when I saw her last and it has almost completely resolved.  There is no pain at this.  She does have some matting of her hair with old blood.  There is no surrounding erythema.    No results found for this or any previous visit (from the past 48 hours). No results found.  Assessment/Plan: Patient with scalp hematoma secondary to a fall.  This has resolved and also drained out over the last several weeks.  It is almost completely gone.  I instructed her to just provide local wound care to the area with washing her hair and then placing Neosporin over the area.  She can follow-up on an as-needed basis  Severa Daniels, M.D. [facility]

## 2023-08-17 NOTE — Patient Instructions (Signed)
 Keep your scalp clean and dry. Ok to shampoo your hair as normal. Put Neosporin on the area after you wash your hair. The area is much smaller now and will continue to heal.   Follow-up with our office as needed.  Please call and ask to speak with a nurse if you develop questions or concerns.   Hematoma A hematoma is a collection of blood. A hematoma can happen: Under the skin. In an organ. In a body space. In a joint space. In other tissues. The blood can thicken (clot) to form a lump that you can see and feel. The lump is often hard and may become sore and tender. The lump can be very small or very big. Most hematomas get better in a few days to weeks. However, some of these may be serious and need medical care. What are the causes? This condition is caused by: An injury. Blood that leaks under the skin. Problems from surgeries. Medical conditions that cause bleeding or bruising. What increases the risk? You are more likely to develop this condition if: You are an older adult. You use medicines that thin your blood. You use NSAIDs, such as ibuprofen , often for pain. You play contact sports. What are the signs or symptoms? Symptoms depend on where the hematoma is in your body. If the hematoma is under the skin, there is: A firm lump on the body. Pain and tenderness in the area. Bruising. The skin above the lump may be blue, dark blue, purple-red, or yellowish. If the hematoma is deep in the tissues or body spaces, there may be: Blood in the stomach. This may cause pain in the belly (abdomen), weakness, passing out (fainting), and shortness of breath. Blood in the head. This may cause a headache, weakness, trouble speaking or understanding speech, or passing out. How is this treated? Treatment depends on the cause, size, and location of the hematoma. Treatment may include: Doing nothing. Many hematomas go away on their own without treatment. Surgery or close monitoring. This  may be needed for large hematomas or hematomas that affect the body's organs. Medicines. These may be given if a medical condition caused the hematoma. Follow these instructions at home: Managing pain, stiffness, and swelling  If told, put ice on the injured area. To do this: Put ice in a plastic bag. Place a towel between your skin and the bag. Leave the ice on for 20 minutes, 2-3 times a day for the first two days. If your skin turns bright red, take off the ice right away to prevent skin damage. The risk of skin damage is higher if you cannot feel pain, heat, or cold. If told, put heat on the injured area. Do this as often as told by your doctor. Use the heat source that your doctor recommends, such as a moist heat pack or a heating pad. Place a towel between your skin and the heat source. Leave the heat on for 20-30 minutes. If your skin turns bright red, take off the heat right away to prevent burns. The risk of burns is higher if you cannot feel pain, heat, or cold. Raise the injured area above the level of your heart while you are sitting or lying down. Wrap the affected area with an elastic bandage, if told by your doctor. Do not wrap the bandage too tight. If your hematoma is on a leg or foot and is painful, your doctor may give you crutches. Use them as told by your doctor.  General instructions Take over-the-counter and prescription medicines only as told by your doctor. Keep all follow-up visits. Your doctor may want to see how your hematoma is healing with treatment. Contact a doctor if: You have a fever. The swelling or bruising gets worse. You start to get more hematomas. Your pain gets worse. Your pain is not getting better with medicine. The skin over the hematoma breaks or starts to bleed. Get help right away if: Your hematoma is in your chest or belly and you: Pass out. Feel weak. Become short of breath. You have a hematoma on your scalp that is caused by a fall or  injury, and you: Have a headache that gets worse. Have trouble speaking or understanding speech. Become less alert or you pass out. These symptoms may be an emergency. Get help right away. Call 911. Do not wait to see if the symptoms will go away. Do not drive yourself to the hospital This information is not intended to replace advice given to you by your health care provider. Make sure you discuss any questions you have with your health care provider. Document Revised: 09/06/2021 Document Reviewed: 09/06/2021 Elsevier Patient Education  2024 [employer].

## 2023-08-18 ENCOUNTER — Ambulatory Visit: Admitting: Student

## 2023-08-18 ENCOUNTER — Ambulatory Visit (INDEPENDENT_AMBULATORY_CARE_PROVIDER_SITE_OTHER): Payer: Medicare HMO | Admitting: Nurse Practitioner

## 2023-08-25 ENCOUNTER — Ambulatory Visit: Admitting: Student

## 2023-08-25 ENCOUNTER — Ambulatory Visit: Admitting: Nurse Practitioner

## 2023-08-30 ENCOUNTER — Other Ambulatory Visit: Payer: Self-pay | Admitting: Nurse Practitioner

## 2023-08-30 DIAGNOSIS — E611 Iron deficiency: Secondary | ICD-10-CM

## 2023-08-30 NOTE — Progress Notes (Deleted)
 Cardiology Clinic Note   Date: 08/30/2023 ID: Stacy Kirk, DOB 1945/08/13, MRN [id number]  Primary Cardiologist:  Constancia Delton, MD  Chief Complaint   Stacy Kirk is a 78 y.o. female who presents to the clinic today for ***  Patient Profile   Stacy Kirk is followed by *** for the history outlined below.      Past medical history significant for: Chronic HFmrEF. Echo 10/04/2022: EF 40 to 45%.  Global hypokinesis.  Moderate concentric LVH.  Grade I DD.  Mildly reduced RV function, normal RV size.  Mild LAE.  Moderate MAC.  Mild calcification, moderate thickening of aortic valve.  Moderate aortic valve stenosis, mean gradient 12 mmHg.  Borderline dilatation of ascending aorta 38 mm. *** Echo 07/26/2023: EF 50 to 55%.  No RWMA.  Moderate LVH.  Indeterminate diastolic parameters.  Normal RV size/function.  Aortic valve was not assessed.  Exam start prematurely by patient. Hypertension. PE/DVT. Hypothyroidism. T2DM. GI bleed.  In summary, patient has a history of PE/DVT.  She underwent hospital admission in July 2024 for mechanical fall and CT revealed right lower lobe PE and right lower extremity DVT.  Echo at that time demonstrated EF 40 to 45% and moderate aortic stenosis detailed above.  Patient was first evaluated by Dr. Junnie Olives on 05/05/2023 to establish care.  She reported lower extremity edema and orthopnea.  She was sleeping in a recliner.  Daily Lasix  was not improving edema.  She was referred to the advanced heart failure clinic and repeat echo was ordered.  Repeat echo demonstrated improved LV function as detailed above.     History of Present Illness    Today, patient ***  Chronic HFmrEF/moderate aortic stenosis Echo in July 2024 demonstrated EF 40 to 45% and moderate aortic stenosis.  Repeat echo in April 2025 was stopped prematurely by patient but revealed EF 50 to 55% with moderate LVH, aortic valve was not assessed.  Patient*** Euvolemic and well  compensated on exam. - Continue torsemide , spironolactone , Toprol . - Keep follow-up with Dr. Julane Ny on 6/6.  Hypertension BP today*** - Continue spironolactone , Toprol .  PE/DVT PE and right lower extremity DVT found July 2024.  Repeat vascular ultrasound of lower extremities was negative for DVT.  Denies spontaneous bleeding concerns.  Patient*** - Continue Eliquis  as managed by PCP.  ROS: All other systems reviewed and are otherwise negative except as noted in History of Present Illness.  EKGs/Labs Reviewed        04/14/2023: ALT 12; AST 14 06/30/2023: BUN 32; Creat 1.93; Potassium 3.6; Sodium 138   06/30/2023: Hemoglobin 10.8; WBC 8.8   04/14/2023: TSH 2.02   04/14/2023: Brain Natriuretic Peptide CANCELED 05/12/2023: Pro B Natriuretic peptide (BNP) 33.0  ***  Risk Assessment/Calculations    {Does this patient have ATRIAL FIBRILLATION?:[id number]} No BP recorded.  {Refresh Note OR Click here to enter BP  :1}***        Physical Exam    VS:  There were no vitals taken for this visit. , BMI There is no height or weight on file to calculate BMI.  GEN: Well nourished, well developed, in no acute distress. Neck: No JVD or carotid bruits. Cardiac: *** RRR. No murmurs. No rubs or gallops.   Respiratory:  Respirations regular and unlabored. Clear to auscultation without rales, wheezing or rhonchi. GI: Soft, nontender, nondistended. Extremities: Radials/DP/PT 2+ and equal bilaterally. No clubbing or cyanosis. No edema ***  Skin: Warm and dry, no rash. Neuro: Strength intact.  Assessment &  Plan   ***  Disposition: ***     {Are you ordering a CV Procedure (e.g. stress test, cath, DCCV, TEE, etc)?   Press F2        :[id number]}   Signed, Lonell Rives. Han Vejar, DNP, NP-C

## 2023-09-01 ENCOUNTER — Encounter: Admitting: Internal Medicine

## 2023-09-08 ENCOUNTER — Emergency Department

## 2023-09-08 ENCOUNTER — Ambulatory Visit: Admitting: Student

## 2023-09-08 ENCOUNTER — Inpatient Hospital Stay
Admission: EM | Admit: 2023-09-08 | Discharge: 2023-09-14 | DRG: 481 | Disposition: A | Discharge status: Skilled Nursing Facility | Attending: Hospitalist | Admitting: Hospitalist

## 2023-09-08 ENCOUNTER — Encounter: Payer: Self-pay | Admitting: Emergency Medicine

## 2023-09-08 ENCOUNTER — Other Ambulatory Visit: Payer: Self-pay

## 2023-09-08 ENCOUNTER — Telehealth: Payer: Self-pay | Admitting: Student

## 2023-09-08 DIAGNOSIS — S72002D Fracture of unspecified part of neck of left femur, subsequent encounter for closed fracture with routine healing: Secondary | ICD-10-CM | POA: Diagnosis not present

## 2023-09-08 DIAGNOSIS — Z79890 Hormone replacement therapy (postmenopausal): Secondary | ICD-10-CM

## 2023-09-08 DIAGNOSIS — E669 Obesity, unspecified: Secondary | ICD-10-CM | POA: Diagnosis not present

## 2023-09-08 DIAGNOSIS — S72142A Displaced intertrochanteric fracture of left femur, initial encounter for closed fracture: Secondary | ICD-10-CM | POA: Diagnosis present

## 2023-09-08 DIAGNOSIS — K219 Gastro-esophageal reflux disease without esophagitis: Secondary | ICD-10-CM | POA: Diagnosis present

## 2023-09-08 DIAGNOSIS — Z885 Allergy status to narcotic agent status: Secondary | ICD-10-CM | POA: Diagnosis not present

## 2023-09-08 DIAGNOSIS — Z85828 Personal history of other malignant neoplasm of skin: Secondary | ICD-10-CM | POA: Diagnosis not present

## 2023-09-08 DIAGNOSIS — M1990 Unspecified osteoarthritis, unspecified site: Secondary | ICD-10-CM | POA: Diagnosis present

## 2023-09-08 DIAGNOSIS — W010XXA Fall on same level from slipping, tripping and stumbling without subsequent striking against object, initial encounter: Secondary | ICD-10-CM | POA: Diagnosis present

## 2023-09-08 DIAGNOSIS — S0990XA Unspecified injury of head, initial encounter: Secondary | ICD-10-CM | POA: Diagnosis not present

## 2023-09-08 DIAGNOSIS — E66812 Obesity, class 2: Secondary | ICD-10-CM | POA: Diagnosis present

## 2023-09-08 DIAGNOSIS — Y92009 Unspecified place in unspecified non-institutional (private) residence as the place of occurrence of the external cause: Secondary | ICD-10-CM | POA: Diagnosis not present

## 2023-09-08 DIAGNOSIS — N179 Acute kidney failure, unspecified: Secondary | ICD-10-CM | POA: Diagnosis present

## 2023-09-08 DIAGNOSIS — W19XXXA Unspecified fall, initial encounter: Secondary | ICD-10-CM

## 2023-09-08 DIAGNOSIS — I130 Hypertensive heart and chronic kidney disease with heart failure and stage 1 through stage 4 chronic kidney disease, or unspecified chronic kidney disease: Secondary | ICD-10-CM | POA: Diagnosis present

## 2023-09-08 DIAGNOSIS — Z6838 Body mass index (BMI) 38.0-38.9, adult: Secondary | ICD-10-CM | POA: Diagnosis not present

## 2023-09-08 DIAGNOSIS — M48061 Spinal stenosis, lumbar region without neurogenic claudication: Secondary | ICD-10-CM | POA: Diagnosis present

## 2023-09-08 DIAGNOSIS — M6281 Muscle weakness (generalized): Secondary | ICD-10-CM | POA: Diagnosis not present

## 2023-09-08 DIAGNOSIS — I10 Essential (primary) hypertension: Secondary | ICD-10-CM | POA: Diagnosis not present

## 2023-09-08 DIAGNOSIS — Z974 Presence of external hearing-aid: Secondary | ICD-10-CM

## 2023-09-08 DIAGNOSIS — E871 Hypo-osmolality and hyponatremia: Secondary | ICD-10-CM | POA: Diagnosis present

## 2023-09-08 DIAGNOSIS — Z833 Family history of diabetes mellitus: Secondary | ICD-10-CM | POA: Diagnosis not present

## 2023-09-08 DIAGNOSIS — G8929 Other chronic pain: Secondary | ICD-10-CM | POA: Diagnosis present

## 2023-09-08 DIAGNOSIS — M4856XA Collapsed vertebra, not elsewhere classified, lumbar region, initial encounter for fracture: Secondary | ICD-10-CM | POA: Diagnosis present

## 2023-09-08 DIAGNOSIS — N1831 Chronic kidney disease, stage 3a: Secondary | ICD-10-CM | POA: Diagnosis present

## 2023-09-08 DIAGNOSIS — S72002A Fracture of unspecified part of neck of left femur, initial encounter for closed fracture: Secondary | ICD-10-CM | POA: Diagnosis present

## 2023-09-08 DIAGNOSIS — Z881 Allergy status to other antibiotic agents status: Secondary | ICD-10-CM

## 2023-09-08 DIAGNOSIS — Z8719 Personal history of other diseases of the digestive system: Secondary | ICD-10-CM

## 2023-09-08 DIAGNOSIS — R2689 Other abnormalities of gait and mobility: Secondary | ICD-10-CM | POA: Diagnosis not present

## 2023-09-08 DIAGNOSIS — Z86711 Personal history of pulmonary embolism: Secondary | ICD-10-CM | POA: Diagnosis not present

## 2023-09-08 DIAGNOSIS — H9190 Unspecified hearing loss, unspecified ear: Secondary | ICD-10-CM

## 2023-09-08 DIAGNOSIS — K449 Diaphragmatic hernia without obstruction or gangrene: Secondary | ICD-10-CM | POA: Diagnosis not present

## 2023-09-08 DIAGNOSIS — Z8543 Personal history of malignant neoplasm of ovary: Secondary | ICD-10-CM

## 2023-09-08 DIAGNOSIS — I5032 Chronic diastolic (congestive) heart failure: Secondary | ICD-10-CM | POA: Diagnosis present

## 2023-09-08 DIAGNOSIS — Z7984 Long term (current) use of oral hypoglycemic drugs: Secondary | ICD-10-CM | POA: Diagnosis not present

## 2023-09-08 DIAGNOSIS — E1122 Type 2 diabetes mellitus with diabetic chronic kidney disease: Secondary | ICD-10-CM | POA: Diagnosis present

## 2023-09-08 DIAGNOSIS — I5042 Chronic combined systolic (congestive) and diastolic (congestive) heart failure: Secondary | ICD-10-CM | POA: Diagnosis not present

## 2023-09-08 DIAGNOSIS — R339 Retention of urine, unspecified: Secondary | ICD-10-CM | POA: Diagnosis present

## 2023-09-08 DIAGNOSIS — E1129 Type 2 diabetes mellitus with other diabetic kidney complication: Secondary | ICD-10-CM | POA: Diagnosis present

## 2023-09-08 DIAGNOSIS — Z87891 Personal history of nicotine dependence: Secondary | ICD-10-CM

## 2023-09-08 DIAGNOSIS — Z886 Allergy status to analgesic agent status: Secondary | ICD-10-CM | POA: Diagnosis not present

## 2023-09-08 DIAGNOSIS — M810 Age-related osteoporosis without current pathological fracture: Secondary | ICD-10-CM | POA: Diagnosis present

## 2023-09-08 DIAGNOSIS — I700 Atherosclerosis of aorta: Secondary | ICD-10-CM | POA: Diagnosis not present

## 2023-09-08 DIAGNOSIS — M25552 Pain in left hip: Secondary | ICD-10-CM | POA: Diagnosis present

## 2023-09-08 DIAGNOSIS — Z7901 Long term (current) use of anticoagulants: Secondary | ICD-10-CM | POA: Diagnosis not present

## 2023-09-08 DIAGNOSIS — M1712 Unilateral primary osteoarthritis, left knee: Secondary | ICD-10-CM | POA: Diagnosis not present

## 2023-09-08 DIAGNOSIS — R5381 Other malaise: Secondary | ICD-10-CM | POA: Diagnosis present

## 2023-09-08 DIAGNOSIS — E039 Hypothyroidism, unspecified: Secondary | ICD-10-CM | POA: Diagnosis present

## 2023-09-08 DIAGNOSIS — Z90710 Acquired absence of both cervix and uterus: Secondary | ICD-10-CM

## 2023-09-08 DIAGNOSIS — M549 Dorsalgia, unspecified: Secondary | ICD-10-CM | POA: Diagnosis not present

## 2023-09-08 DIAGNOSIS — Z79899 Other long term (current) drug therapy: Secondary | ICD-10-CM

## 2023-09-08 DIAGNOSIS — S72142D Displaced intertrochanteric fracture of left femur, subsequent encounter for closed fracture with routine healing: Secondary | ICD-10-CM | POA: Diagnosis not present

## 2023-09-08 DIAGNOSIS — R350 Frequency of micturition: Secondary | ICD-10-CM | POA: Diagnosis present

## 2023-09-08 DIAGNOSIS — Z87442 Personal history of urinary calculi: Secondary | ICD-10-CM

## 2023-09-08 DIAGNOSIS — I2699 Other pulmonary embolism without acute cor pulmonale: Secondary | ICD-10-CM | POA: Diagnosis present

## 2023-09-08 DIAGNOSIS — R000 Tachycardia, unspecified: Secondary | ICD-10-CM | POA: Diagnosis not present

## 2023-09-08 DIAGNOSIS — Z888 Allergy status to other drugs, medicaments and biological substances status: Secondary | ICD-10-CM

## 2023-09-08 NOTE — ED Triage Notes (Addendum)
 PT arrived by EMS due to fall at home on her left hip. Did not hit head.  On Eliquis .  EMS states left leg is shorter than left   Fentanyl  given by EMS IV 18g VS stable on truck

## 2023-09-08 NOTE — Telephone Encounter (Signed)
 New Message:    Patient's husband  wants to know if any way possible that patient does not have to come in for her appointment on 10-06-23. He wonder if she could get a phone call about her test results. He says it is so hard to get here, and he has a handicap child also.

## 2023-09-09 ENCOUNTER — Inpatient Hospital Stay

## 2023-09-09 ENCOUNTER — Encounter
Admission: EM | Disposition: A | Discharge status: Skilled Nursing Facility | Payer: Self-pay | Source: Home / Self Care | Attending: Internal Medicine

## 2023-09-09 ENCOUNTER — Inpatient Hospital Stay: Admitting: Anesthesiology

## 2023-09-09 ENCOUNTER — Emergency Department

## 2023-09-09 DIAGNOSIS — S72002A Fracture of unspecified part of neck of left femur, initial encounter for closed fracture: Secondary | ICD-10-CM | POA: Diagnosis present

## 2023-09-09 DIAGNOSIS — Z85828 Personal history of other malignant neoplasm of skin: Secondary | ICD-10-CM | POA: Diagnosis not present

## 2023-09-09 DIAGNOSIS — Y92009 Unspecified place in unspecified non-institutional (private) residence as the place of occurrence of the external cause: Secondary | ICD-10-CM

## 2023-09-09 DIAGNOSIS — I130 Hypertensive heart and chronic kidney disease with heart failure and stage 1 through stage 4 chronic kidney disease, or unspecified chronic kidney disease: Secondary | ICD-10-CM | POA: Diagnosis present

## 2023-09-09 DIAGNOSIS — Z79890 Hormone replacement therapy (postmenopausal): Secondary | ICD-10-CM | POA: Diagnosis not present

## 2023-09-09 DIAGNOSIS — G8929 Other chronic pain: Secondary | ICD-10-CM | POA: Diagnosis present

## 2023-09-09 DIAGNOSIS — M4856XA Collapsed vertebra, not elsewhere classified, lumbar region, initial encounter for fracture: Secondary | ICD-10-CM | POA: Diagnosis present

## 2023-09-09 DIAGNOSIS — I5032 Chronic diastolic (congestive) heart failure: Secondary | ICD-10-CM | POA: Diagnosis present

## 2023-09-09 DIAGNOSIS — M25552 Pain in left hip: Secondary | ICD-10-CM | POA: Diagnosis present

## 2023-09-09 DIAGNOSIS — M48061 Spinal stenosis, lumbar region without neurogenic claudication: Secondary | ICD-10-CM | POA: Diagnosis present

## 2023-09-09 DIAGNOSIS — R339 Retention of urine, unspecified: Secondary | ICD-10-CM | POA: Diagnosis present

## 2023-09-09 DIAGNOSIS — E1122 Type 2 diabetes mellitus with diabetic chronic kidney disease: Secondary | ICD-10-CM | POA: Diagnosis present

## 2023-09-09 DIAGNOSIS — I10 Essential (primary) hypertension: Secondary | ICD-10-CM | POA: Diagnosis not present

## 2023-09-09 DIAGNOSIS — S72142A Displaced intertrochanteric fracture of left femur, initial encounter for closed fracture: Secondary | ICD-10-CM

## 2023-09-09 DIAGNOSIS — Z6838 Body mass index (BMI) 38.0-38.9, adult: Secondary | ICD-10-CM | POA: Diagnosis not present

## 2023-09-09 DIAGNOSIS — E1129 Type 2 diabetes mellitus with other diabetic kidney complication: Secondary | ICD-10-CM | POA: Diagnosis present

## 2023-09-09 DIAGNOSIS — E66812 Obesity, class 2: Secondary | ICD-10-CM | POA: Diagnosis present

## 2023-09-09 DIAGNOSIS — N179 Acute kidney failure, unspecified: Secondary | ICD-10-CM | POA: Diagnosis present

## 2023-09-09 DIAGNOSIS — W19XXXA Unspecified fall, initial encounter: Secondary | ICD-10-CM | POA: Diagnosis not present

## 2023-09-09 DIAGNOSIS — N1831 Chronic kidney disease, stage 3a: Secondary | ICD-10-CM | POA: Diagnosis present

## 2023-09-09 DIAGNOSIS — E039 Hypothyroidism, unspecified: Secondary | ICD-10-CM | POA: Diagnosis present

## 2023-09-09 DIAGNOSIS — Z87891 Personal history of nicotine dependence: Secondary | ICD-10-CM | POA: Diagnosis not present

## 2023-09-09 DIAGNOSIS — Z86711 Personal history of pulmonary embolism: Secondary | ICD-10-CM | POA: Diagnosis not present

## 2023-09-09 DIAGNOSIS — Z833 Family history of diabetes mellitus: Secondary | ICD-10-CM | POA: Diagnosis not present

## 2023-09-09 DIAGNOSIS — E669 Obesity, unspecified: Secondary | ICD-10-CM | POA: Diagnosis present

## 2023-09-09 DIAGNOSIS — Z8543 Personal history of malignant neoplasm of ovary: Secondary | ICD-10-CM | POA: Diagnosis not present

## 2023-09-09 DIAGNOSIS — Z7984 Long term (current) use of oral hypoglycemic drugs: Secondary | ICD-10-CM | POA: Diagnosis not present

## 2023-09-09 DIAGNOSIS — W010XXA Fall on same level from slipping, tripping and stumbling without subsequent striking against object, initial encounter: Secondary | ICD-10-CM | POA: Diagnosis present

## 2023-09-09 DIAGNOSIS — E871 Hypo-osmolality and hyponatremia: Secondary | ICD-10-CM | POA: Diagnosis present

## 2023-09-09 DIAGNOSIS — Z885 Allergy status to narcotic agent status: Secondary | ICD-10-CM | POA: Diagnosis not present

## 2023-09-09 DIAGNOSIS — Z7901 Long term (current) use of anticoagulants: Secondary | ICD-10-CM | POA: Diagnosis not present

## 2023-09-09 DIAGNOSIS — Z886 Allergy status to analgesic agent status: Secondary | ICD-10-CM | POA: Diagnosis not present

## 2023-09-09 HISTORY — PX: INTRAMEDULLARY (IM) NAIL INTERTROCHANTERIC: SHX5875

## 2023-09-09 LAB — TYPE AND SCREEN
ABO/RH(D): B POS
Antibody Screen: NEGATIVE

## 2023-09-09 LAB — BASIC METABOLIC PANEL WITH GFR
Anion gap: 15 (ref 5–15)
Anion gap: 15 (ref 5–15)
BUN: 33 mg/dL — ABNORMAL HIGH (ref 8–23)
BUN: 31 mg/dL — ABNORMAL HIGH (ref 8–23)
CO2: 29 mmol/L (ref 22–32)
CO2: 29 mmol/L (ref 22–32)
Calcium: 9.2 mg/dL (ref 8.9–10.3)
Calcium: 9.5 mg/dL (ref 8.9–10.3)
Chloride: 93 mmol/L — ABNORMAL LOW (ref 98–111)
Chloride: 94 mmol/L — ABNORMAL LOW (ref 98–111)
Creatinine, Ser: 1.66 mg/dL — ABNORMAL HIGH (ref 0.44–1.00)
Creatinine, Ser: 1.59 mg/dL — ABNORMAL HIGH (ref 0.44–1.00)
GFR, Estimated: 31 mL/min — ABNORMAL LOW (ref >60)
GFR, Estimated: 33 mL/min — ABNORMAL LOW (ref >60)
Glucose, Bld: 148 mg/dL — ABNORMAL HIGH (ref 70–99)
Glucose, Bld: 171 mg/dL — ABNORMAL HIGH (ref 70–99)
Potassium: 3.5 mmol/L (ref 3.5–5.1)
Potassium: 3.7 mmol/L (ref 3.5–5.1)
Sodium: 137 mmol/L (ref 135–145)
Sodium: 138 mmol/L (ref 135–145)

## 2023-09-09 LAB — CBC WITH DIFFERENTIAL/PLATELET
Abs Immature Granulocytes: 0.06 10*3/uL (ref 0.00–0.07)
Basophils Absolute: 0 10*3/uL (ref 0.0–0.1)
Basophils Relative: 0 %
Eosinophils Absolute: 0.4 10*3/uL (ref 0.0–0.5)
Eosinophils Relative: 4 %
HCT: 38.8 % (ref 36.0–46.0)
Hemoglobin: 12 g/dL (ref 12.0–15.0)
Immature Granulocytes: 1 %
Lymphocytes Relative: 29 %
Lymphs Abs: 2.5 10*3/uL (ref 0.7–4.0)
MCH: 25.6 pg — ABNORMAL LOW (ref 26.0–34.0)
MCHC: 30.9 g/dL (ref 30.0–36.0)
MCV: 82.7 fL (ref 80.0–100.0)
Monocytes Absolute: 0.9 10*3/uL (ref 0.1–1.0)
Monocytes Relative: 10 %
Neutro Abs: 4.8 10*3/uL (ref 1.7–7.7)
Neutrophils Relative %: 56 %
Platelets: 300 10*3/uL (ref 150–400)
RBC: 4.69 MIL/uL (ref 3.87–5.11)
RDW: 20.3 % — ABNORMAL HIGH (ref 11.5–15.5)
WBC: 8.6 10*3/uL (ref 4.0–10.5)
nRBC: 0 % (ref 0.0–0.2)

## 2023-09-09 LAB — CBC
HCT: 39.7 % (ref 36.0–46.0)
Hemoglobin: 12.3 g/dL (ref 12.0–15.0)
MCH: 25.6 pg — ABNORMAL LOW (ref 26.0–34.0)
MCHC: 31 g/dL (ref 30.0–36.0)
MCV: 82.7 fL (ref 80.0–100.0)
Platelets: 294 10*3/uL (ref 150–400)
RBC: 4.8 MIL/uL (ref 3.87–5.11)
RDW: 19.9 % — ABNORMAL HIGH (ref 11.5–15.5)
WBC: 13.9 10*3/uL — ABNORMAL HIGH (ref 4.0–10.5)
nRBC: 0 % (ref 0.0–0.2)

## 2023-09-09 LAB — GLUCOSE, CAPILLARY
Glucose-Capillary: 169 mg/dL — ABNORMAL HIGH (ref 70–99)
Glucose-Capillary: 193 mg/dL — ABNORMAL HIGH (ref 70–99)
Glucose-Capillary: 167 mg/dL — ABNORMAL HIGH (ref 70–99)
Glucose-Capillary: 157 mg/dL — ABNORMAL HIGH (ref 70–99)
Glucose-Capillary: 185 mg/dL — ABNORMAL HIGH (ref 70–99)

## 2023-09-09 LAB — PROTIME-INR
INR: 1.4 — ABNORMAL HIGH (ref 0.8–1.2)
Prothrombin Time: 17.1 s — ABNORMAL HIGH (ref 11.4–15.2)

## 2023-09-09 LAB — APTT: aPTT: 36 s (ref 24–36)

## 2023-09-09 LAB — SURGICAL PCR SCREEN
MRSA, PCR: NEGATIVE
Staphylococcus aureus: NEGATIVE

## 2023-09-09 LAB — BRAIN NATRIURETIC PEPTIDE: B Natriuretic Peptide: 30.6 pg/mL (ref 0.0–100.0)

## 2023-09-09 SURGERY — FIXATION, FRACTURE, INTERTROCHANTERIC, WITH INTRAMEDULLARY ROD
Anesthesia: General | Site: Hip | Laterality: Left

## 2023-09-09 MED ORDER — METOCLOPRAMIDE HCL 5 MG/ML IJ SOLN: 5.0000 mg | Freq: Three times a day (TID) | INTRAMUSCULAR | Status: DC | PRN

## 2023-09-09 MED ORDER — OXYCODONE-ACETAMINOPHEN 5-325 MG PO TABS: 1.0000 | ORAL_TABLET | ORAL | Status: DC | PRN

## 2023-09-09 MED ORDER — TORSEMIDE 20 MG PO TABS
40.0000 mg | ORAL_TABLET | Freq: Every day | ORAL | Status: DC
  Administered 2023-09-10 – 2023-09-13 (×4): 40 mg via ORAL
  Filled 2023-09-09 (×5): qty 2

## 2023-09-09 MED ORDER — LEVOTHYROXINE SODIUM 50 MCG PO TABS
100.0000 ug | ORAL_TABLET | Freq: Every day | ORAL | Status: DC
  Administered 2023-09-09 – 2023-09-14 (×6): 100 ug via ORAL
  Filled 2023-09-09 (×6): qty 2

## 2023-09-09 MED ORDER — TRANEXAMIC ACID-NACL 1000-0.7 MG/100ML-% IV SOLN
1000.0000 mg | INTRAVENOUS | Status: AC
  Administered 2023-09-09: 1000 mg via INTRAVENOUS
  Filled 2023-09-09: qty 100

## 2023-09-09 MED ORDER — ENSURE PLUS HIGH PROTEIN PO LIQD
237.0000 mL | Freq: Two times a day (BID) | ORAL | Status: DC
  Administered 2023-09-10 (×2): 237 mL via ORAL

## 2023-09-09 MED ORDER — ACETAMINOPHEN 500 MG PO TABS
500.0000 mg | ORAL_TABLET | Freq: Four times a day (QID) | ORAL | Status: AC
  Administered 2023-09-10 (×2): 500 mg via ORAL
  Filled 2023-09-09 (×3): qty 1

## 2023-09-09 MED ORDER — SCOPOLAMINE 1 MG/3DAYS TD PT72
1.0000 | MEDICATED_PATCH | TRANSDERMAL | Status: DC
  Administered 2023-09-09: 1.5 mg via TRANSDERMAL
  Filled 2023-09-09 (×2): qty 1

## 2023-09-09 MED ORDER — SODIUM CHLORIDE 0.9 % IV SOLN: INTRAVENOUS | Status: DC | PRN

## 2023-09-09 MED ORDER — ACETAMINOPHEN 500 MG PO TABS
1000.0000 mg | ORAL_TABLET | Freq: Once | ORAL | Status: DC
  Filled 2023-09-09: qty 2

## 2023-09-09 MED ORDER — CEFAZOLIN SODIUM-DEXTROSE 2-4 GM/100ML-% IV SOLN
2.0000 g | Freq: Four times a day (QID) | INTRAVENOUS | Status: AC
  Administered 2023-09-09 – 2023-09-10 (×2): 2 g via INTRAVENOUS
  Filled 2023-09-09 (×2): qty 100

## 2023-09-09 MED ORDER — ACETAMINOPHEN 10 MG/ML IV SOLN
INTRAVENOUS | Status: AC
  Filled 2023-09-09: qty 100

## 2023-09-09 MED ORDER — DOCUSATE SODIUM 100 MG PO CAPS
100.0000 mg | ORAL_CAPSULE | Freq: Two times a day (BID) | ORAL | Status: DC
  Administered 2023-09-10 – 2023-09-14 (×6): 100 mg via ORAL
  Filled 2023-09-09 (×10): qty 1

## 2023-09-09 MED ORDER — DEXAMETHASONE SODIUM PHOSPHATE 10 MG/ML IJ SOLN
INTRAMUSCULAR | Status: DC | PRN
  Administered 2023-09-09: 10 mg via INTRAVENOUS

## 2023-09-09 MED ORDER — BUPIVACAINE-EPINEPHRINE (PF) 0.25% -1:200000 IJ SOLN
INTRAMUSCULAR | Status: DC | PRN
  Administered 2023-09-09: 20 mL via PERINEURAL

## 2023-09-09 MED ORDER — LORATADINE 10 MG PO TABS
10.0000 mg | ORAL_TABLET | Freq: Every day | ORAL | Status: DC
  Administered 2023-09-10 – 2023-09-14 (×5): 10 mg via ORAL
  Filled 2023-09-09 (×6): qty 1

## 2023-09-09 MED ORDER — CEFAZOLIN SODIUM-DEXTROSE 2-4 GM/100ML-% IV SOLN
INTRAVENOUS | Status: AC
Start: 2023-09-09 — End: 2023-09-09
  Filled 2023-09-09: qty 100

## 2023-09-09 MED ORDER — SPIRONOLACTONE 25 MG PO TABS
25.0000 mg | ORAL_TABLET | Freq: Every day | ORAL | Status: DC
  Administered 2023-09-10 – 2023-09-13 (×4): 25 mg via ORAL
  Filled 2023-09-09 (×5): qty 1

## 2023-09-09 MED ORDER — SODIUM CHLORIDE 0.9 % IV SOLN
12.5000 mg | Freq: Four times a day (QID) | INTRAVENOUS | Status: DC | PRN
  Administered 2023-09-09: 12.5 mg via INTRAVENOUS
  Filled 2023-09-09 (×3): qty 0.5

## 2023-09-09 MED ORDER — PROPOFOL 10 MG/ML IV BOLUS
INTRAVENOUS | Status: DC | PRN
Start: 2023-09-09 — End: 2023-09-09
  Administered 2023-09-09: 90 mg via INTRAVENOUS

## 2023-09-09 MED ORDER — FENTANYL CITRATE (PF) 100 MCG/2ML IJ SOLN
INTRAMUSCULAR | Status: AC
Start: 2023-09-09 — End: 2023-09-09
  Filled 2023-09-09: qty 2

## 2023-09-09 MED ORDER — RENA-VITE PO TABS
1.0000 | ORAL_TABLET | Freq: Every day | ORAL | Status: DC
  Administered 2023-09-11 – 2023-09-13 (×3): 1 via ORAL
  Filled 2023-09-09 (×6): qty 1

## 2023-09-09 MED ORDER — METOCLOPRAMIDE HCL 5 MG PO TABS: 5.0000 mg | ORAL_TABLET | Freq: Three times a day (TID) | ORAL | Status: DC | PRN

## 2023-09-09 MED ORDER — HYDROCODONE-ACETAMINOPHEN 5-325 MG PO TABS
1.0000 | ORAL_TABLET | ORAL | Status: DC | PRN
  Administered 2023-09-14 (×2): 1 via ORAL
  Filled 2023-09-09 (×2): qty 1

## 2023-09-09 MED ORDER — TRANEXAMIC ACID-NACL 1000-0.7 MG/100ML-% IV SOLN
INTRAVENOUS | Status: AC
  Filled 2023-09-09: qty 100

## 2023-09-09 MED ORDER — PHENYLEPHRINE HCL-NACL 20-0.9 MG/250ML-% IV SOLN
INTRAVENOUS | Status: DC | PRN
  Administered 2023-09-09: 30 ug/min via INTRAVENOUS

## 2023-09-09 MED ORDER — METHOCARBAMOL 500 MG PO TABS
500.0000 mg | ORAL_TABLET | Freq: Three times a day (TID) | ORAL | Status: DC | PRN
  Administered 2023-09-09 – 2023-09-13 (×4): 500 mg via ORAL
  Filled 2023-09-09 (×4): qty 1

## 2023-09-09 MED ORDER — LIDOCAINE HCL (CARDIAC) PF 100 MG/5ML IV SOSY
PREFILLED_SYRINGE | INTRAVENOUS | Status: DC | PRN
  Administered 2023-09-09: 80 mg via INTRAVENOUS

## 2023-09-09 MED ORDER — HYDROCODONE-ACETAMINOPHEN 7.5-325 MG PO TABS
1.0000 | ORAL_TABLET | ORAL | Status: DC | PRN
  Administered 2023-09-11: 2 via ORAL
  Administered 2023-09-11: 1 via ORAL
  Administered 2023-09-12: 2 via ORAL
  Administered 2023-09-12 – 2023-09-13 (×2): 1 via ORAL
  Administered 2023-09-13: 2 via ORAL
  Administered 2023-09-13: 1 via ORAL
  Administered 2023-09-13: 2 via ORAL
  Filled 2023-09-09 (×4): qty 2
  Filled 2023-09-09: qty 1
  Filled 2023-09-09: qty 2
  Filled 2023-09-09 (×2): qty 1

## 2023-09-09 MED ORDER — PHENOL 1.4 % MT LIQD
1.0000 | OROMUCOSAL | Status: DC | PRN
  Administered 2023-09-10 (×2): 1 via OROMUCOSAL
  Filled 2023-09-09: qty 177

## 2023-09-09 MED ORDER — MENTHOL 3 MG MT LOZG
1.0000 | LOZENGE | OROMUCOSAL | Status: DC | PRN
  Administered 2023-09-10: 3 mg via ORAL
  Filled 2023-09-09: qty 9

## 2023-09-09 MED ORDER — PHENYLEPHRINE HCL-NACL 20-0.9 MG/250ML-% IV SOLN
INTRAVENOUS | Status: AC
  Filled 2023-09-09: qty 250

## 2023-09-09 MED ORDER — PHENYLEPHRINE 80 MCG/ML (10ML) SYRINGE FOR IV PUSH (FOR BLOOD PRESSURE SUPPORT)
PREFILLED_SYRINGE | INTRAVENOUS | Status: DC | PRN
  Administered 2023-09-09 (×2): 80 ug via INTRAVENOUS

## 2023-09-09 MED ORDER — OXYCODONE HCL 5 MG/5ML PO SOLN: 5.0000 mg | Freq: Once | ORAL | Status: DC | PRN

## 2023-09-09 MED ORDER — METOPROLOL SUCCINATE ER 25 MG PO TB24
25.0000 mg | ORAL_TABLET | Freq: Every day | ORAL | Status: DC
  Administered 2023-09-10 – 2023-09-14 (×5): 25 mg via ORAL
  Filled 2023-09-09 (×6): qty 1

## 2023-09-09 MED ORDER — SENNOSIDES-DOCUSATE SODIUM 8.6-50 MG PO TABS
1.0000 | ORAL_TABLET | Freq: Every evening | ORAL | Status: DC | PRN
  Administered 2023-09-13 – 2023-09-14 (×2): 1 via ORAL
  Filled 2023-09-09 (×2): qty 1

## 2023-09-09 MED ORDER — FENTANYL CITRATE PF 50 MCG/ML IJ SOSY
12.5000 ug | PREFILLED_SYRINGE | INTRAMUSCULAR | Status: DC | PRN
  Administered 2023-09-09 (×3): 12.5 ug via INTRAVENOUS
  Filled 2023-09-09 (×3): qty 1

## 2023-09-09 MED ORDER — HYDRALAZINE HCL 20 MG/ML IJ SOLN: 5.0000 mg | INTRAMUSCULAR | Status: DC | PRN

## 2023-09-09 MED ORDER — PROPOFOL 10 MG/ML IV BOLUS
INTRAVENOUS | Status: AC
  Filled 2023-09-09: qty 20

## 2023-09-09 MED ORDER — ACETAMINOPHEN 10 MG/ML IV SOLN
INTRAVENOUS | Status: DC | PRN
  Administered 2023-09-09: 1000 mg via INTRAVENOUS

## 2023-09-09 MED ORDER — ACETAMINOPHEN 325 MG PO TABS
650.0000 mg | ORAL_TABLET | Freq: Four times a day (QID) | ORAL | Status: DC | PRN
  Administered 2023-09-13: 650 mg via ORAL
  Filled 2023-09-09 (×2): qty 2

## 2023-09-09 MED ORDER — ACETAMINOPHEN 325 MG PO TABS: 325.0000 mg | ORAL_TABLET | Freq: Four times a day (QID) | ORAL | Status: DC | PRN

## 2023-09-09 MED ORDER — ROCURONIUM BROMIDE 100 MG/10ML IV SOLN
INTRAVENOUS | Status: DC | PRN
  Administered 2023-09-09: 60 mg via INTRAVENOUS

## 2023-09-09 MED ORDER — DROPERIDOL 2.5 MG/ML IJ SOLN: 0.6250 mg | Freq: Once | INTRAMUSCULAR | Status: DC | PRN

## 2023-09-09 MED ORDER — FERROUS SULFATE 325 (65 FE) MG PO TABS
325.0000 mg | ORAL_TABLET | Freq: Every day | ORAL | Status: DC
  Administered 2023-09-10 – 2023-09-14 (×5): 325 mg via ORAL
  Filled 2023-09-09 (×6): qty 1

## 2023-09-09 MED ORDER — INSULIN ASPART 100 UNIT/ML IJ SOLN: 0.0000 [IU] | Freq: Every day | INTRAMUSCULAR | Status: DC

## 2023-09-09 MED ORDER — SUGAMMADEX SODIUM 200 MG/2ML IV SOLN
INTRAVENOUS | Status: DC | PRN
  Administered 2023-09-09: 200 mg via INTRAVENOUS

## 2023-09-09 MED ORDER — 0.9 % SODIUM CHLORIDE (POUR BTL) OPTIME
TOPICAL | Status: DC | PRN
  Administered 2023-09-09: 500 mL

## 2023-09-09 MED ORDER — OXYCODONE HCL 5 MG PO TABS: 5.0000 mg | ORAL_TABLET | Freq: Once | ORAL | Status: DC | PRN

## 2023-09-09 MED ORDER — INSULIN ASPART 100 UNIT/ML IJ SOLN
0.0000 [IU] | Freq: Three times a day (TID) | INTRAMUSCULAR | Status: DC
  Administered 2023-09-09 – 2023-09-10 (×3): 2 [IU] via SUBCUTANEOUS
  Administered 2023-09-10 – 2023-09-11 (×2): 1 [IU] via SUBCUTANEOUS
  Administered 2023-09-12 – 2023-09-13 (×2): 2 [IU] via SUBCUTANEOUS
  Administered 2023-09-13: 1 [IU] via SUBCUTANEOUS
  Filled 2023-09-09 (×9): qty 1

## 2023-09-09 MED ORDER — BUPIVACAINE-EPINEPHRINE (PF) 0.25% -1:200000 IJ SOLN
INTRAMUSCULAR | Status: AC
Start: 2023-09-09 — End: 2023-09-09
  Filled 2023-09-09: qty 30

## 2023-09-09 MED ORDER — CEFAZOLIN SODIUM-DEXTROSE 2-4 GM/100ML-% IV SOLN
2.0000 g | Freq: Once | INTRAVENOUS | Status: AC
  Administered 2023-09-09: 2 g via INTRAVENOUS
  Filled 2023-09-09: qty 100

## 2023-09-09 MED ORDER — LIDOCAINE 5 % EX PTCH
1.0000 | MEDICATED_PATCH | Freq: Every day | CUTANEOUS | Status: DC
  Administered 2023-09-09 – 2023-09-13 (×4): 1 via TRANSDERMAL
  Filled 2023-09-09 (×6): qty 1

## 2023-09-09 MED ORDER — MORPHINE SULFATE (PF) 2 MG/ML IV SOLN
0.5000 mg | INTRAVENOUS | Status: DC | PRN
  Administered 2023-09-12 (×2): 1 mg via INTRAVENOUS
  Filled 2023-09-09 (×3): qty 1

## 2023-09-09 MED ORDER — ACETAMINOPHEN 10 MG/ML IV SOLN: 1000.0000 mg | Freq: Once | INTRAVENOUS | Status: DC | PRN

## 2023-09-09 MED ORDER — FENTANYL CITRATE (PF) 100 MCG/2ML IJ SOLN: 25.0000 ug | INTRAMUSCULAR | Status: DC | PRN

## 2023-09-09 MED ORDER — ONDANSETRON HCL 4 MG/2ML IJ SOLN
INTRAMUSCULAR | Status: DC | PRN
  Administered 2023-09-09: 4 mg via INTRAVENOUS

## 2023-09-09 MED ORDER — PANTOPRAZOLE SODIUM 40 MG PO TBEC
40.0000 mg | DELAYED_RELEASE_TABLET | Freq: Every day | ORAL | Status: DC
  Administered 2023-09-10 – 2023-09-14 (×5): 40 mg via ORAL
  Filled 2023-09-09 (×6): qty 1

## 2023-09-09 MED ORDER — APIXABAN 5 MG PO TABS
5.0000 mg | ORAL_TABLET | Freq: Two times a day (BID) | ORAL | Status: DC
  Administered 2023-09-10 – 2023-09-14 (×9): 5 mg via ORAL
  Filled 2023-09-09 (×9): qty 1

## 2023-09-09 MED ORDER — ONDANSETRON HCL 4 MG/2ML IJ SOLN
4.0000 mg | Freq: Three times a day (TID) | INTRAMUSCULAR | Status: DC | PRN
  Administered 2023-09-09 – 2023-09-10 (×3): 4 mg via INTRAVENOUS
  Filled 2023-09-09 (×4): qty 2

## 2023-09-09 MED ORDER — CHLORHEXIDINE GLUCONATE CLOTH 2 % EX PADS
6.0000 | MEDICATED_PAD | Freq: Every day | CUTANEOUS | Status: DC
  Administered 2023-09-09: 6 via TOPICAL

## 2023-09-09 MED ORDER — FENTANYL CITRATE (PF) 100 MCG/2ML IJ SOLN
INTRAMUSCULAR | Status: DC | PRN
  Administered 2023-09-09: 50 ug via INTRAVENOUS

## 2023-09-09 SURGICAL SUPPLY — 43 items
BIT DRILL CALIBRATED 4.2 (BIT) IMPLANT
BIT DRILL CANN 16 HIP (BIT) IMPLANT
BIT DRILL CANN STP 6/9 HIP (BIT) IMPLANT
BIT DRILL TAPERED 10 (BIT) IMPLANT
BLADE TFNA HELICAL 95 NON STRL (Anchor) IMPLANT
BNDG COHESIVE 6X5 TAN ST LF (GAUZE/BANDAGES/DRESSINGS) ×2 IMPLANT
CHLORAPREP W/TINT 26 (MISCELLANEOUS) ×1 IMPLANT
DERMABOND ADVANCED .7 DNX12 (GAUZE/BANDAGES/DRESSINGS) ×1 IMPLANT
DRAPE C-ARM XRAY 36X54 (DRAPES) ×1 IMPLANT
DRAPE C-ARMOR (DRAPES) IMPLANT
DRAPE SHEET LG 3/4 BI-LAMINATE (DRAPES) ×1 IMPLANT
DRSG OPSITE POSTOP 3X4 (GAUZE/BANDAGES/DRESSINGS) IMPLANT
DRSG OPSITE POSTOP 4X6 (GAUZE/BANDAGES/DRESSINGS) IMPLANT
ELECT CAUTERY BLADE 6.4 (BLADE) IMPLANT
ELECTRODE REM PT RTRN 9FT ADLT (ELECTROSURGICAL) IMPLANT
GLOVE PI ORTHO PRO STRL 7.5 (GLOVE) ×2 IMPLANT
GLOVE SURG SYN 7.5 E (GLOVE) ×1 IMPLANT
GLOVE SURG SYN 7.5 PF PI (GLOVE) ×1 IMPLANT
GOWN SRG XL LVL 3 NONREINFORCE (GOWNS) ×1 IMPLANT
GOWN STRL REUS W/ TWL LRG LVL3 (GOWN DISPOSABLE) ×1 IMPLANT
GUIDEWIRE 3.2X400 (WIRE) IMPLANT
HANDLE YANKAUER SUCT OPEN TIP (MISCELLANEOUS) ×1 IMPLANT
KIT PATIENT CARE HANA TABLE (KITS) ×1 IMPLANT
KIT TURNOVER CYSTO (KITS) ×1 IMPLANT
MANIFOLD NEPTUNE II (INSTRUMENTS) ×1 IMPLANT
MAT ABSORB FLUID 56X50 GRAY (MISCELLANEOUS) ×1 IMPLANT
NAIL CANN TFNA 9MM/130 DEG TI (Nail) IMPLANT
NDL HYPO 21X1.5 SAFETY (NEEDLE) ×1 IMPLANT
NEEDLE HYPO 21X1.5 SAFETY (NEEDLE) ×1 IMPLANT
NS IRRIG 500ML POUR BTL (IV SOLUTION) ×1 IMPLANT
PACK HIP COMPR (MISCELLANEOUS) ×1 IMPLANT
PAD ARMBOARD POSITIONER FOAM (MISCELLANEOUS) ×1 IMPLANT
PENCIL SMOKE EVACUATOR (MISCELLANEOUS) ×1 IMPLANT
SCREW LOCK STAR 5X34 (Screw) IMPLANT
SLEEVE SCD COMPRESS KNEE MED (STOCKING) ×1 IMPLANT
SUT VIC AB 1 CT1 36 (SUTURE) ×1 IMPLANT
SUT VIC AB 1 CTX 27 (SUTURE) IMPLANT
SUT VIC AB 2-0 CT2 27 (SUTURE) ×1 IMPLANT
SUTURE STRATA SPIR 4-0 18 (SUTURE) ×1 IMPLANT
SYR 20ML LL LF (SYRINGE) ×1 IMPLANT
TAPE MICROFOAM 4IN (TAPE) IMPLANT
TRAP FLUID SMOKE EVACUATOR (MISCELLANEOUS) IMPLANT
WATER STERILE IRR 1000ML POUR (IV SOLUTION) ×1 IMPLANT

## 2023-09-09 NOTE — Plan of Care (Signed)
  Problem: Coping: Goal: Ability to adjust to condition or change in health will improve Outcome: Progressing   Problem: Clinical Measurements: Goal: Diagnostic test results will improve Outcome: Progressing   Problem: Coping: Goal: Level of anxiety will decrease Outcome: Progressing   Problem: Pain Managment: Goal: General experience of comfort will improve and/or be controlled Outcome: Progressing

## 2023-09-09 NOTE — H&P (Addendum)
 History and Physical    Stacy Kirk:[id number] DOB: [birth year] DOA: 09/08/2023  Referring MD/NP/PA:   PCP: Dorothe Gaster, NP   Patient coming from:  The patient is coming from home.     Chief Complaint: fall and left hip pain  HPI: Stacy Kirk is a 78 y.o. female with medical history significant of dCHF, PE on Eliquis , HTN, DM, hypothyroidism, GI bleeding, CKD-3A, lymphedema, hard of hearing, ovarian cancer, lumbar spinal stenosis, who presents with fall and left hip pain.  Per pt and her husband (I called her husband by phone), she cannot have going to the bathroom, tripped for steps, fell, injured her left hip, causing pain in left hip.  The pain is constant, severe, sharp, nonradiating, aggravated by movement.  She also injured her back.  She stated that she has a chronic back pain which is much worse in the middle back.  No leg numbness or weakness.  Denies chest pain, cough, SOB.  No nausea, vomiting, diarrhea or abdominal pain.  No symptoms of UTI.  No fever or chills.  She took her last dose of Eliquis  in the morning (6/13).   Data reviewed independently and ED Course: pt was found to have WBC 8.6, renal function close to baseline, temperature normal, blood pressure 127/70, heart rate 48 --> 100, RR 10, oxygen saturation 91-94% on room air.  Chest x-ray negative.  X-ray of left hip/pelvis showed comminuted left intertrochanteric fracture.  Patient is admitted to telemetry bed as inpatient.  Dr. Clyda Dark of Ortho is consulted.   EKG: I have personally reviewed.  Seems to be sinus rhythm with frequent PAC, QTc 438, early R wave progression.  Review of Systems:   General: no fevers, chills, no body weight gain, has fatigue HEENT: no blurry vision, sore throat. HOH Respiratory: no dyspnea, coughing, wheezing CV: no chest pain, no palpitations GI: no nausea, vomiting, abdominal pain, diarrhea, constipation GU: no dysuria, burning on urination, increased urinary  frequency, hematuria  Ext: no leg edema Neuro: no unilateral weakness, numbness, or tingling, no vision change or hearing loss. Has fall Skin: no rash, no skin tear. MSK: has back pain and left hip pain Heme: No easy bruising.  Travel history: No recent long distant travel.   Allergy:  Allergies  Allergen Reactions   Erythromycin Diarrhea and Other (See Comments)    Tears her stomach up. Major digestive upset.   Tramadol Shortness Of Breath and Other (See Comments)    Felt like lungs filled up; unable to lay down   Albumin (Human) Other (See Comments)    Pt unsure of reaction   Aspirin  Itching and Other (See Comments)    Caused lethargy, also   Codeine Itching and Other (See Comments)    Cause lethargy, also   Cortisone Other (See Comments)    NO STEROID INJECTIONS; headaches   Linzess  [Linaclotide ] Diarrhea   Propoxyphene Other (See Comments)    Terrible reaction; headaches   Simvastatin Itching and Other (See Comments)    Severe muscle aches   Triamcinolone Acetonide Other (See Comments)    headache    Past Medical History:  Diagnosis Date   Arthritis    Cancer (HCC)    ovarian, skin face- skin cancer   Complication of anesthesia    slow to awaken everday, even slower with anesthesia   Constipation    Diabetes mellitus    Type II   GERD (gastroesophageal reflux disease)    Headache 05/12/2015   after  a fall headache for 8 months   Hemorrhoids    Hernia    History of kidney stones    passed   Hypertension    Hypothyroidism    Loss of appetite    Muscle spasm    back   Osteoporosis    Redness    abd wound   Urine frequency    unable to hold urine    Wears hearing aid    bilateral     Past Surgical History:  Procedure Laterality Date   ABDOMINAL HYSTERECTOMY     APPENDECTOMY     CHOLECYSTECTOMY     COLONOSCOPY W/ POLYPECTOMY     HERNIA REPAIR  Jun2012   open component separation w biologic mesh (Dr. Afton Horse)   TOE SURGERY Left    2nd toe    TONSILLECTOMY      Social History:  reports that she quit smoking about 25 years ago. Her smoking use included cigarettes. She started smoking about 40 years ago. She has been exposed to tobacco smoke. She has never used smokeless tobacco. She reports that she does not drink alcohol and does not use drugs.  Family History:  Family History  Problem Relation Age of Onset   Diabetes Mother    Early death Sister    Drug abuse Sister      Prior to Admission medications   Medication Sig Start Date End Date Taking? Authorizing Provider  acetaminophen  (TYLENOL ) 500 MG tablet Take 1,000 mg by mouth every 6 (six) hours as needed (FOR PAIN.).    [provider]  cetirizine  (ZYRTEC ) 10 MG tablet Take 1 tablet (10 mg total) by mouth daily. 06/07/23   Dorothe Gaster, NP  ELIQUIS  5 MG TABS tablet TAKE 1 TABLET BY MOUTH TWICE A DAY 05/02/23   Dorothe Gaster, NP  ferrous sulfate  325 (65 FE) MG tablet TAKE 325 MG BY MOUTH DAILY. 08/30/23   Dorothe Gaster, NP  fluticasone  (FLONASE ) 50 MCG/ACT nasal spray SPRAY 2 SPRAYS INTO EACH NOSTRIL EVERY DAY 07/03/23   Dorothe Gaster, NP  levothyroxine  (SYNTHROID ) 100 MCG tablet TAKE 1 TABLET BY MOUTH EVERY DAY 03/09/23   Dorothe Gaster, NP  metFORMIN  (GLUCOPHAGE ) 500 MG tablet TAKE 1 TABLET (500 MG TOTAL) BY MOUTH DAILY AFTER SUPPER. 03/02/23   Dorothe Gaster, NP  metoprolol  succinate (TOPROL -XL) 25 MG 24 hr tablet Take 1 tablet (25 mg total) by mouth daily. 03/16/23   Dorothe Gaster, NP  pantoprazole  (PROTONIX ) 40 MG tablet TAKE 1 TABLET BY MOUTH EVERY DAY 03/02/23   Dorothe Gaster, NP  spironolactone  (ALDACTONE ) 25 MG tablet TAKE 1 TABLET (25 MG TOTAL) BY MOUTH DAILY. 05/15/23   Dorothe Gaster, NP  torsemide  (DEMADEX ) 20 MG tablet Take 2 tablets (40 mg total) by mouth daily. 05/15/23   Constancia Delton, MD    Physical Exam: Vitals:   09/08/23 2322 09/08/23 2328 09/08/23 2330 09/08/23 2331  BP:   127/70   Pulse:   100   Resp:  12 (!) 8 10  Temp:      TempSrc:       SpO2:   94%   Weight: 88.5 kg     Height: 5' (1.524 m)      General: Not in acute distress HEENT:       Eyes: PERRL, EOMI, no jaundice       ENT: No discharge from the ears and nose, no pharynx injection, no tonsillar enlargement.  Neck: No JVD, no bruit, no mass felt. Heme: No neck lymph node enlargement. Cardiac: S1/S2, RRR, No murmurs, No gallops or rubs. Respiratory: No rales, wheezing, rhonchi or rubs. GI: Soft, nondistended, nontender, no rebound pain, no organomegaly, BS present. GU: No hematuria Ext: has trace leg edema bilaterally. 1+DP/PT pulse bilaterally. Musculoskeletal: Has left hip pain, and tenderness over the midline of middle back.  Left leg is externally rotated. Skin: No rashes.  Neuro: Alert, oriented X3, cranial nerves II-XII grossly intact, moves all extremities.  Psych: Patient is not psychotic, no suicidal or hemocidal ideation.  Labs on Admission: I have personally reviewed following labs and imaging studies  CBC: Recent Labs  Lab 09/08/23 2329  WBC 8.6  NEUTROABS 4.8  HGB 12.0  HCT 38.8  MCV 82.7  PLT 300   Basic Metabolic Panel: Recent Labs  Lab 09/08/23 2329  NA 137  K 3.5  CL 93*  CO2 29  GLUCOSE 148*  BUN 33*  CREATININE 1.66*  CALCIUM 9.2   GFR: Estimated Creatinine Clearance: 26.8 mL/min (A) (by C-G formula based on SCr of 1.66 mg/dL (H)). Liver Function Tests: No results for input(s): AST, ALT, ALKPHOS, BILITOT, PROT, ALBUMIN in the last 168 hours. No results for input(s): LIPASE, AMYLASE in the last 168 hours. No results for input(s): AMMONIA in the last 168 hours. Coagulation Profile: Recent Labs  Lab 09/08/23 2329  INR 1.4*   Cardiac Enzymes: No results for input(s): CKTOTAL, CKMB, CKMBINDEX, TROPONINI in the last 168 hours. BNP (last 3 results) Recent Labs    11/29/22 1028 12/16/22 1130 05/12/23 1407  PROBNP 106.0* 31.0 33.0   HbA1C: No results for input(s): HGBA1C in the  last 72 hours. CBG: No results for input(s): GLUCAP in the last 168 hours. Lipid Profile: No results for input(s): CHOL, HDL, LDLCALC, TRIG, CHOLHDL, LDLDIRECT in the last 72 hours. Thyroid  Function Tests: No results for input(s): TSH, T4TOTAL, FREET4, T3FREE, THYROIDAB in the last 72 hours. Anemia Panel: No results for input(s): VITAMINB12, FOLATE, FERRITIN, TIBC, IRON , RETICCTPCT in the last 72 hours. Urine analysis:    Component Value Date/Time   COLORURINE YELLOW 10/22/2022 2106   APPEARANCEUR HAZY (A) 10/22/2022 2106   LABSPEC 1.026 10/22/2022 2106   PHURINE 5.0 10/22/2022 2106   GLUCOSEU NEGATIVE 10/22/2022 2106   HGBUR NEGATIVE 10/22/2022 2106   BILIRUBINUR negative 06/07/2023 1217   KETONESUR NEGATIVE 10/22/2022 2106   PROTEINUR Positive (A) 06/07/2023 1217   PROTEINUR NEGATIVE 10/22/2022 2106   UROBILINOGEN 0.2 06/07/2023 1217   NITRITE negative 06/07/2023 1217   NITRITE NEGATIVE 10/22/2022 2106   LEUKOCYTESUR Trace (A) 06/07/2023 1217   LEUKOCYTESUR NEGATIVE 10/22/2022 2106   Sepsis Labs: @LABRCNTIP (procalcitonin:4,lacticidven:4) )No results found for this or any previous visit (from the past 240 hours).   Radiological Exams on Admission:   Assessment/Plan Principal Problem:   Closed left hip fracture, initial encounter [facility]) Active Problems:   Fall at home, initial encounter   Pulmonary embolism (HCC)   Chronic diastolic CHF (congestive heart failure) (HCC)   Hypertension   Chronic kidney disease, stage 3a (HCC)   Hypothyroidism   Type II diabetes mellitus with renal manifestations (HCC)   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-L5 level   Obesity (BMI 30-39.9)   Assessment and Plan:  Closed left hip fracture, initial encounter [facility]): Comminuted left intratrochanteric fracture.  Patient has moderate pain now. No neurovascular compromise. Orthopedic surgeon, Dr. Clyda Dark was consulted.   - will admit to tele bed as inpt - Pain control:  prn morphine , percocet and tyleno - When necessary Zofran  for nausea - Robaxin for muscle spasm - Lidoderm  patch for pain - type and cross - INR/PTT - PT/OT when able to (not ordered now)  Fall at home, initial encounter -f/u CT-head  Pulmonary embolism (HCC): pt is on Eliquis , last dose was in AM of 6/13 -will temporarily hold Eliquis  for surgery - Need to be renewed as well as possible after surgery  Chronic diastolic CHF (congestive heart failure) (HCC): 2D echo on 07/26/2023 showed EF of 50-55%.  Patient does not have SOB, only has trace leg edema, CHF seem to be compensated. - Continue home torsemide  and spironolactone  - Check BNP --> 30.6  Hypertension -IV hydralazine  as needed - Patient is on metoprolol , spironolactone , torsemide   Chronic kidney disease, stage 3a (HCC): Renal function close to baseline.  Recent baseline creatinine 1.39 on 05/12/2023, 1.93 on/4/25.  Her creatinine is 1.66, BUN 33, GFR 31 today. - Follow-up with BMP  Hypothyroidism -Synthroid   Type II diabetes mellitus with renal manifestations [facility]): Recent A1c 6.9, well-controlled.  Patient is taking metformin . - Sliding scale insulin   Lumbar stenosis: Patient has chronic lower back pain, but now has worsening back pain in the middle back - Follow-up x-ray of T-spine and L-spine - Pain control as above  Addendum: since pt has left hip fracture, not be able to position for xray, therefore will change to CT of T- and L-spin.   Obesity (BMI 30-39.9): Patient has Obesity Class II, with body weight 88.5 Kg and BMI  38.12 kg/m2.  - Encourage losing weight - Exercise and healthy diet   Perioperative Cardiac Risk: pt has multiple comorbidities as listed above, including dCHF. She is on diuretics at home.  Patient does not have SOB, only has trace leg edema,  and BNP is normal 30.6. CHF is compensated. Patient is fully dependent on her husband. At this time point, no further work up is needed. Patient's GUPTA  score perioperative myocardial infarction or cardaic arrest is 2.26 %.  I discussed the risk with patient and her husband, pt would like to proceed for surgery.       DVT ppx: SCD  Code Status: Full code   Family Communication:   Yes, patient's husband by phone    Disposition Plan:  rehab  Consults called:  Dr. Clyda Dark of Ortho is consulted.   Admission status and Level of care: Telemetry Medical:   as inpt        Dispo: The patient is from: Home              Anticipated d/c is to: rehab              Anticipated d/c date is: 2 days              Patient currently is not medically stable to d/c.    Severity of Illness:  The appropriate patient status for this patient is INPATIENT. Inpatient status is judged to be reasonable and necessary in order to provide the required intensity of service to ensure the patient's safety. The patient's presenting symptoms, physical exam findings, and initial radiographic and laboratory data in the context of their chronic comorbidities is felt to place them at high risk for further clinical deterioration. Furthermore, it is not anticipated that the patient will be medically stable for discharge from the hospital within 2 midnights of admission.   * I certify that at the point of admission it is my clinical judgment that  the patient will require inpatient hospital care spanning beyond 2 midnights from the point of admission due to high intensity of service, high risk for further deterioration and high frequency of surveillance required.*       Date of Service 09/09/2023    Fidencio Hue [facility]   If 7PM-7AM, please contact night-coverage [email or web address] 09/09/2023, 1:36 AM

## 2023-09-09 NOTE — Op Note (Signed)
 Patient Name: Stacy Kirk  ZOX:[id number]  Pre-Operative Diagnosis: Left hip Intertrochanteric fracture  Post-Operative Diagnosis: (same)  Procedure: Left Hip Intramedullary nail   Components/Implants: Nail:TFNA 28mmx130 deg x  Lag Blade :95mm Locking Screw:80mm  Date of Surgery: 09/09/2023  Surgeon: Venus Ginsberg MD  Assistant: None  Anesthesiologist: Lincoln Renshaw  Anesthesia:  General   EBL: 100cc  Complications: None   Brief history: The patient is a 78 year old female who presented to the [facility] emergency room after a fall and found to have a  left hip intertrochanteric fracture.  The patient was admitted by the medical team and optimized for surgery.  A thorough discussion was had with the patient and family about the risks and benefits of surgical intervention for their hip fracture as definitive treatment.  The patient and family opted to proceed with the operation.  All preoperative films were reviewed and an appropriate surgical plan was made prior to surgery.   Description of procedure: The patient was brought to the operating room where laterality was confirmed by all those present to be the left side.  The patient was administered anesthesia on a stretcher prior to being moved supine on the operating room table. Patient was given an intravenous dose of antibiotics for surgical prophylaxis and TXA.  All bony prominences and extremities were well padded and the patient was securely attached to the table boots, a perineal post was placed and the patient had a safety strap placed.  Surgical site was prepped with alcohol and chlorhexidine. The surgical site over the hip was and draped in typical sterile fashion with multiple layers of adhesive and nonadhesive drapes.  The incision site was marked out with a sterile marker under fluoroscopic guidance.    A surgical timeout was then called with participation of all staff in the room the patient was  then a confirmed again and laterality confirmed. The hip fracture was reduced through indirect measures with traction and rotation of the leg on the fracture table. After an acceptable reduction was obtained on AP and lateral images the procedure started.  An incision was made just proximal to the greater trochanter through the skin subcutaneous tissues and an incision was made in the glut max fascia.  A guidewire was placed through the greater trochanter at the tip under x-ray guidance into the intertrochanteric region.  The position of this wire was assessed on AP and lateral fluoroscopic images to ensure position.  An opening reamer was used to create access at the tip of the greater trochanter under fluoroscopic guidance.   A size 9 x 170 nail was advanced through the reamed hole in the proximal femur and seated within the femur to an appropriate depth under fluoroscopic guidance.  The aiming jig was used to mark an incision site for a lag blade to the femoral head which was then incised with a scalpel.  A wire was then advanced through the lateral cortex of the femur into the center of the femoral head on both AP and lateral fluoroscopic imaging stopping at the subchondral bone without penetration of the femoral head.  This wire was then used to measure for a lag screw and a size 95 lag blade was inserted into the femoral head through the lateral cortex of the femur and the nail under fluoroscopic guidance.  The setscrew was then tightened in the proximal part of the nail and engaged with the lag blade with a small amount of play left so as to allow  compression of the fracture.   The lag screw driver was removed and the aiming jig was switched for the distal interlocking screw.  A drill was used to drill a hole for the distal interlocking screw under fluoroscopic guidance and a size 34 screw was placed.  The intramedullary nail was assessed on AP and lateral fluoroscopic guidance prior to removal of the  aiming jig.  Final fluoroscopic x-rays were then taken after removal of the jig.  The nail was found to be in appropriate position on AP and lateral imaging with appropriate lengths of both the lag screw in the distal locking screw.  The fascia was closed with 0 Vicryl interrupted figure-of-eight sutures.  The subcutaneous tissues were closed with 2-0 Vicryl and the skin closed with 3-0 stratafix and Dermabond.  Sterile dressings were applied to the incisions.   The patient was awoken from anesthesia transferred off of the operating room table onto a hospital bed.  The patient had a good pulse postoperatively in the foot . the patient was then transferred to the PACU in stable condition.

## 2023-09-09 NOTE — Progress Notes (Signed)
 Imaging and labs reviewed, Patient with a comminuted left intertrochanteric femur fracture Will need surgical fixation with intramedullary nail Plan for OR later this morning 09/09/2023 tentatively around 930am NPO for OR Hold chemical AC for OR Full length femur films Will review fracture, treatment options and surgery plan with patient in morning and complete consent Admit to medical team  Full Consult to follow  Venus Ginsberg MD

## 2023-09-09 NOTE — Transfer of Care (Signed)
 Immediate Anesthesia Transfer of Care Note  Patient: Stacy Kirk  Procedure(s) Performed: FIXATION, FRACTURE, INTERTROCHANTERIC, WITH INTRAMEDULLARY ROD (Left: Hip)  Patient Location: PACU  Anesthesia Type:General  Level of Consciousness: drowsy  Airway & Oxygen Therapy: Patient Spontanous Breathing and Patient connected to face mask oxygen  Post-op Assessment: Report given to RN, Post -op Vital signs reviewed and stable, and Patient moving all extremities  Post vital signs: Reviewed and stable  Last Vitals:  Vitals Value Taken Time  BP 148/61 09/09/23 15:31  Temp 37 C 09/09/23 15:31  Pulse 104 09/09/23 15:41  Resp 23 09/09/23 15:41  SpO2 100 % 09/09/23 15:41  Vitals shown include unfiled device data.  Last Pain:  Vitals:   09/09/23 1531  TempSrc:   PainSc: Asleep         Complications: No notable events documented.

## 2023-09-09 NOTE — ED Provider Notes (Signed)
 [facility] Provider Note    Event Date/Time   First MD Initiated Contact with Patient 09/08/23 2314     (approximate)   History   Hip Pain   HPI Stacy Kirk is a 78 y.o. female who presents by EMS for evaluation of pain in her left hip after a fall.  She reports a mechanical fall after tripping while using her walker.  She landed on her back but took most of the fall on her left hip and that is the only part that hurts.  She has some pre-existing back issues and has had surgery in the past but her back does not hurt at this time.  Her leg is shortened and externally rotated and she has severe pain if she tries to move the leg.  No numbness or tingling distally.  She denies hitting her head but paramedics also report that she takes Eliquis .  She is not short of breath and not having any chest pain.  No nausea or vomiting.  She received IV fentanyl  en route to the hospital.     Physical Exam   Triage Vital Signs: ED Triage Vitals  Encounter Vitals Group     BP 09/08/23 2330 127/70     Girls Systolic BP Percentile --      Girls Diastolic BP Percentile --      Boys Systolic BP Percentile --      Boys Diastolic BP Percentile --      Pulse Rate 09/08/23 2320 (!) 48     Resp 09/08/23 2320 (!) 25     Temp 09/08/23 2320 97.7 F (36.5 C)     Temp Source 09/08/23 2320 Oral     SpO2 09/08/23 2320 91 %     Weight 09/08/23 2322 88.5 kg (195 lb 3.2 oz)     Height 09/08/23 2322 1.524 m (5')     Head Circumference --      Peak Flow --      Pain Score 09/08/23 2321 10     Pain Loc --      Pain Education --      Exclude from Growth Chart --     Most recent vital signs: Vitals:   09/09/23 0206 09/09/23 0334  BP: (!) 163/97 (!) 156/98  Pulse: (!) 56 (!) 107  Resp: 16 16  Temp: 98.1 F (36.7 C) 98.3 F (36.8 C)  SpO2: 98% 97%    General: Awake, no distress as long as she is not moving. CV:  Good peripheral perfusion.  Mild tachycardia, regular  rhythm, normal heart sounds. Resp:  Normal effort. Speaking easily and comfortably, no accessory muscle usage nor intercostal retractions.  Lungs clear to auscultation. Abd:  No distention.  No tenderness to palpation of the abdomen. Other:  No evidence of head trauma including no ecchymosis nor contusion nor abrasion on the back of her head.  No tenderness to palpation of the head nor of her neck.  She is reporting no back pain or tenderness.  However her left leg is externally rotated and shortened as she has severe pain in the proximal femur with any attempt at passive range of motion.  Normal capillary refill.  Chronic peripheral edema.   ED Results / Procedures / Treatments   Labs (all labs ordered are listed, but only abnormal results are displayed) Labs Reviewed  BASIC METABOLIC PANEL WITH GFR - Abnormal; Notable for the following components:      Result Value  Chloride 93 (*)    Glucose, Bld 148 (*)    BUN 33 (*)    Creatinine, Ser 1.66 (*)    GFR, Estimated 31 (*)    All other components within normal limits  CBC WITH DIFFERENTIAL/PLATELET - Abnormal; Notable for the following components:   MCH 25.6 (*)    RDW 20.3 (*)    All other components within normal limits  PROTIME-INR - Abnormal; Notable for the following components:   Prothrombin Time 17.1 (*)    INR 1.4 (*)    All other components within normal limits  CBC - Abnormal; Notable for the following components:   WBC 13.9 (*)    MCH 25.6 (*)    RDW 19.9 (*)    All other components within normal limits  BASIC METABOLIC PANEL WITH GFR - Abnormal; Notable for the following components:   Chloride 94 (*)    Glucose, Bld 171 (*)    BUN 31 (*)    Creatinine, Ser 1.59 (*)    GFR, Estimated 33 (*)    All other components within normal limits  BRAIN NATRIURETIC PEPTIDE  APTT  TYPE AND SCREEN     EKG  ED ECG REPORT I, Lynnda Sas, the attending physician, personally viewed and interpreted this ECG.  Date:  09/08/2023 EKG Time: 23: 24 Rate: 96 Rhythm: normal sinus rhythm QRS Axis: normal Intervals: Supraventricular bigeminy with incomplete right bundle branch block, otherwise unremarkable ST/T Wave abnormalities: Non-specific ST segment / T-wave changes, but no clear evidence of acute ischemia. Narrative Interpretation: no definitive evidence of acute ischemia; does not meet STEMI criteria.    RADIOLOGY I independently viewed and interpreted the patient's 1 view chest x-ray.  I see no evidence of pneumonia or pulmonary edema.  I also read the radiologist's report, which confirmed no acute findings.  I also independently viewed and interpreted the patient's hip and pelvis x-rays.  She has an intertrochanteric fracture on the left, confirmed by the radiologist.   PROCEDURES:  Critical Care performed: No  Procedures    IMPRESSION / MDM / ASSESSMENT AND PLAN / ED COURSE  I reviewed the triage vital signs and the nursing notes.                              Differential diagnosis includes, but is not limited to, hip fracture, dislocation, pelvic fracture.  Patient's presentation is most consistent with acute presentation with potential threat to life or bodily function.  Labs/studies ordered: Chest x-ray, EKG, pelvis/hip x-rays (left hip), CBC with differential, BMP, pro time-INR, type and screen  Interventions/Medications given:  Medications  insulin  aspart (novoLOG ) injection 0-9 Units (has no administration in time range)  insulin  aspart (novoLOG ) injection 0-5 Units ( Subcutaneous Not Given 09/09/23 0124)  ondansetron  (ZOFRAN ) injection 4 mg (4 mg Intravenous Given 09/09/23 0314)  hydrALAZINE  (APRESOLINE ) injection 5 mg (has no administration in time range)  acetaminophen  (TYLENOL ) tablet 650 mg (has no administration in time range)  fentaNYL  (SUBLIMAZE ) injection 12.5 mcg (12.5 mcg Intravenous Given 09/09/23 0310)  oxyCODONE -acetaminophen  (PERCOCET/ROXICET) 5-325 MG per tablet 1  tablet (has no administration in time range)  methocarbamol (ROBAXIN) tablet 500 mg (500 mg Oral Given 09/09/23 0315)  lidocaine  (LIDODERM ) 5 % 1 patch (1 patch Transdermal Patch Applied 09/09/23 0218)  senna-docusate (Senokot-S) tablet 1 tablet (has no administration in time range)  metoprolol  succinate (TOPROL -XL) 24 hr tablet 25 mg (has no administration in time range)  spironolactone  (ALDACTONE ) tablet 25 mg (has no administration in time range)  torsemide  (DEMADEX ) tablet 40 mg (has no administration in time range)  levothyroxine  (SYNTHROID ) tablet 100 mcg (has no administration in time range)  pantoprazole  (PROTONIX ) EC tablet 40 mg (has no administration in time range)  ferrous sulfate  tablet 325 mg (has no administration in time range)  loratadine  (CLARITIN ) tablet 10 mg (has no administration in time range)    (Note:  hospital course my include additional interventions and/or labs/studies not listed above.)   No evidence of head or neck trauma, isolated left hip injury.  Confirmed left intertrochanteric fracture.  Patient has no other sign of other injury at this time.  She is very hard of hearing but I was able to let her know what is happening and her need for surgery.  I will consult orthopedic surgery and update her family.  Her pain is well-controlled at this time.     Clinical Course as of 09/09/23 0411  Sat Sep 09, 2023  0018 Paging Dr. Clyda Dark with orthopedics [CF]  0028 Consulted by phone with Dr. Clyda Dark with orthopedics.  He advised that he would take the patient to the operating room in the morning regardless of her Eliquis  status and that she would be getting an intramedullary nail.  I will consult the hospitalist team for admission and call to update the patient's husband. [CF]  0033 Spoke by phone with Mr. [name] Holian, the patient's husband.  He understands the situation and that she will be admitted for surgery in the morning. [CF]    Clinical Course User  Index [CF] Lynnda Sas, MD     FINAL CLINICAL IMPRESSION(S) / ED DIAGNOSES   Final diagnoses:  Closed comminuted intertrochanteric fracture of left femur, initial encounter (HCC)  Hard of hearing  Current use of long term anticoagulation     Rx / DC Orders   ED Discharge Orders     None        Note:  This document was prepared using Dragon voice recognition software and may include unintentional dictation errors.   Lynnda Sas, MD 09/09/23 [phone]

## 2023-09-09 NOTE — ED Notes (Signed)
 Pt called out and stated that she was in a lot of pain.  Pt rated her pain using faces scale = 8.

## 2023-09-09 NOTE — Consult Note (Signed)
 ORTHOPAEDIC CONSULTATION  REQUESTING PHYSICIAN: Alphonsus Jeans, MD  Chief Complaint:   Left hip fracture  History of Present Illness: Stacy Kirk is a 78 y.o. female  with medical history significant of dCHF, PE on Eliquis , HTN, DM, hypothyroidism, GI bleeding, CKD-3A, lymphedema, hard of hearing, ovarian cancer, lumbar spinal stenosis who presented to the emergency room after a mechanical fall going to the bathroom when she tripped over a step.  Patient reports she normally walks with a walker at baseline.  She reports she had immediate pain in her left hip denies any loss of consciousness.  Only endorses left hip pain on time of exam.  Throughout discussion patient answers questions appropriately however on further testing does not know which hospital she is at, states the year is 2024.  She is oriented to person and that she is at a hospital and is able to give information back to me correctly however is unsure of medication she is on does not recall having blood clots or pulmonary embolism last year.  Based on my evaluation she is not able to give full consent at this time.  I called and discussed with the patient's husband who is her proxy and reviewed everything and the story is consistent with that she had a fall last night and denies any pre-existing left hip pain or other ongoing issues no recent shortness of breath.  After a fall last summer she was found to have several blood clots and was placed on Eliquis .  Dose of Eliquis  was yesterday.  Past Medical History:  Diagnosis Date   Arthritis    Cancer (HCC)    ovarian, skin face- skin cancer   Complication of anesthesia    slow to awaken everday, even slower with anesthesia   Constipation    Diabetes mellitus    Type II   GERD (gastroesophageal reflux disease)    Headache 05/12/2015   after a fall headache for 8 months   Hemorrhoids    Hernia    History of  kidney stones    passed   Hypertension    Hypothyroidism    Loss of appetite    Muscle spasm    back   Osteoporosis    Redness    abd wound   Urine frequency    unable to hold urine    Wears hearing aid    bilateral    Past Surgical History:  Procedure Laterality Date   ABDOMINAL HYSTERECTOMY     APPENDECTOMY     CHOLECYSTECTOMY     COLONOSCOPY W/ POLYPECTOMY     HERNIA REPAIR  Jun2012   open component separation w biologic mesh (Dr. Afton Horse)   TOE SURGERY Left    2nd toe   TONSILLECTOMY     Social History   Socioeconomic History   Marital status: Married    Spouse name: Not on file   Number of children: 2   Years of education: Not on file   Highest education level: Not on file  Occupational History   Not on file  Tobacco Use   Smoking status: Former    Current packs/day: 0.00    Types: Cigarettes    Start date: 03/29/1983    Quit date: 03/28/1998    Years since quitting: 25.4    Passive exposure: Past   Smokeless tobacco: Never   Tobacco comments:    quit 2002  Vaping Use   Vaping status: Never Used  Substance and Sexual Activity   Alcohol  use: No   Drug use: No   Sexual activity: Not on file  Other Topics Concern   Not on file  Social History Narrative   Retired      Social Drivers of Corporate investment banker Strain: Not on file  Food Insecurity: No Food Insecurity (09/09/2023)   Hunger Vital Sign    Worried About Running Out of Food in the Last Year: Never true    Ran Out of Food in the Last Year: Never true  Transportation Needs: No Transportation Needs (09/09/2023)   PRAPARE - Administrator, Civil Service (Medical): No    Lack of Transportation (Non-Medical): No  Physical Activity: Not on file  Stress: Not on file  Social Connections: Unknown (09/09/2023)   Social Connection and Isolation Panel    Frequency of Communication with Friends and Family: Patient declined    Frequency of Social Gatherings with Friends and Family:  Patient declined    Attends Religious Services: Never    Database administrator or Organizations: No    Attends Engineer, structural: Never    Marital Status: Married   Family History  Problem Relation Age of Onset   Diabetes Mother    Early death Sister    Drug abuse Sister    Allergies  Allergen Reactions   Erythromycin Diarrhea and Other (See Comments)    Tears her stomach up. Major digestive upset.   Tramadol Shortness Of Breath and Other (See Comments)    Felt like lungs filled up; unable to lay down   Albumin (Human) Other (See Comments)    Pt unsure of reaction   Aspirin  Itching and Other (See Comments)    Caused lethargy, also   Codeine Itching and Other (See Comments)    Cause lethargy, also   Cortisone Other (See Comments)    NO STEROID INJECTIONS; headaches   Linzess  [Linaclotide ] Diarrhea   Propoxyphene Other (See Comments)    Terrible reaction; headaches   Simvastatin Itching and Other (See Comments)    Severe muscle aches   Triamcinolone Acetonide Other (See Comments)    headache   Prior to Admission medications   Medication Sig Start Date End Date Taking? Authorizing Provider  acetaminophen  (TYLENOL ) 500 MG tablet Take 1,000 mg by mouth every 6 (six) hours as needed (FOR PAIN.).    [provider]  cetirizine  (ZYRTEC ) 10 MG tablet Take 1 tablet (10 mg total) by mouth daily. 06/07/23   Dorothe Gaster, NP  ELIQUIS  5 MG TABS tablet TAKE 1 TABLET BY MOUTH TWICE A DAY 05/02/23   Dorothe Gaster, NP  ferrous sulfate  325 (65 FE) MG tablet TAKE 325 MG BY MOUTH DAILY. 08/30/23   Dorothe Gaster, NP  fluticasone  (FLONASE ) 50 MCG/ACT nasal spray SPRAY 2 SPRAYS INTO EACH NOSTRIL EVERY DAY 07/03/23   Dorothe Gaster, NP  levothyroxine  (SYNTHROID ) 100 MCG tablet TAKE 1 TABLET BY MOUTH EVERY DAY 03/09/23   Dorothe Gaster, NP  metFORMIN  (GLUCOPHAGE ) 500 MG tablet TAKE 1 TABLET (500 MG TOTAL) BY MOUTH DAILY AFTER SUPPER. 03/02/23   Dorothe Gaster, NP  metoprolol   succinate (TOPROL -XL) 25 MG 24 hr tablet Take 1 tablet (25 mg total) by mouth daily. 03/16/23   Dorothe Gaster, NP  pantoprazole  (PROTONIX ) 40 MG tablet TAKE 1 TABLET BY MOUTH EVERY DAY 03/02/23   Dorothe Gaster, NP  spironolactone  (ALDACTONE ) 25 MG tablet TAKE 1 TABLET (25 MG TOTAL) BY MOUTH DAILY. 05/15/23  Dorothe Gaster, NP  torsemide  (DEMADEX ) 20 MG tablet Take 2 tablets (40 mg total) by mouth daily. 05/15/23   Constancia Delton, MD   CT HEAD WO CONTRAST ( ) Result Date: 09/09/2023 CLINICAL DATA:  Fall with head trauma and back pain. EXAM: CT HEAD WITHOUT CONTRAST CT THORACIC AND LUMBAR SPINE WITHOUT CONTRAST TECHNIQUE: Multidetector CT imaging of the head was performed following the standard protocol from the skull base to vertex without intravenous contrast. Multiplanar CT image reconstructions were also generated. Multidetector CT imaging of the thoracic and lumbar spine was performed without contrast. Multiplanar CT image reconstructions were also generated. RADIATION DOSE REDUCTION: This exam was performed according to the departmental dose-optimization program which includes automated exposure control, adjustment of the mA and/or kV according to patient size and/or use of iterative reconstruction technique. COMPARISON:  Head CT 05/06/2023, lumbar spine CT 10/21/2022, and CTA chest 10/04/2022. Also, PA and lateral chest 06/30/2023. FINDINGS: CT HEAD FINDINGS Brain: There is moderately advanced cerebral atrophy with atrophic ventriculomegaly and moderate to severe small vessel disease of the cerebral white matter. There are lacunar infarcts in the right caudate head and external capsules, chronic. Additional chronic tiny lacunar infarcts in the thalami. The cerebellum and brainstem are unremarkable. No cortical based acute infarct, hemorrhage, mass or midline shift are seen. Basal cisterns are clear. Vascular: Patchy calcific plaques both siphons, both distal vertebral arteries. No hyperdense central  vessels. Skull: Negative for fractures or focal lesions. Sinuses/orbits: Old lens replacements with otherwise negative orbits. Clear sinuses and mastoids. Other: None. CT THORACIC SPINE FINDINGS Alignment: Mild chronic thoracic spine kyphodextroscoliosis. Trace chronic anterolisthesis T6-7. No traumatic or further listhesis. Vertebrae: Osteopenia. Bridging osteophytes and multilevel interbody ankylosis related to bridging osteophytes again is seen from T4-5 through T10-11, with background advanced spondylosis. There is reactive sclerosis in the vertebral bodies T4, T6, T8 and T10, which was seen previously and unchanged. No primary pathologic lesion or destructive vertebral mass is seen. There is spinous process enthesopathy, with spinous process bridging T8-9 and T10-11. Paraspinal and other soft tissues: The lungs show posterior atelectasis. There is no pleural effusion or pneumothorax in the visualized chest. Small caliber central airways which could be due to tracheobronchomalacia, respiration or bronchospasm. The esophagus is patulous, mildly thickened but no more than previously. Small to moderate-sized hiatal hernia. The heart is moderately enlarged. There are coronary artery calcifications. Aortic atherosclerosis and uncoiling. Disc levels: The discs are collapsed in the thoracic spine at all levels from T2-3 through T11-12. At T6-7 there is a tiny central calcified disc extrusion but no resulting mass effect. There is minimal posterior endplate ridging at most levels but this does not significantly encroach on the thecal sac. No herniated discs or cord compromise are suspected. There are facet spurs in the lower third of the thoracic spine, at T10-11 causing moderate left foraminal stenosis, at T11-12 bilateral mild foraminal stenosis. The other foramina appear clear. Other: None. CT LUMBAR SPINE FINDINGS Segmentation: There are 4 lumbar type vertebrae. The lumbosacral junction is defined L4-S1. Alignment:  Mild lumbar levoscoliosis apex at L1. Chronic grade 1 anterolisthesis of L4-S1 is unchanged. Minimal grade 1 retrolisthesis L1-2 also chronic. Vertebrae: The prior study demonstrated an acute L1 compression fracture but with only mild loss of vertebral height. This has progressed with now up to 50% anterior and 30% posterior vertebral height loss with increased posterosuperior cortical retropulsion up to 4 mm lateralizing to the left. No acute fracture is seen. Again seen is L3-4 posterior fusion hardware  with rods and pedicle screws and laminectomy defect, with interbody metal hardware. The fusion appears solid. There is definitely solid fusion over the facet joints. Generalized osteopenia. There is advanced spondylosis. No focal pathologic lesion is suspected. Reactive endplate sclerosis again noted T11-12 to the left. Paraspinal and other soft tissues: No paraspinal mass, hemorrhage or fluid collection. Heavy aortoiliac calcific plaque. Stable 2 cm right adrenal nodule, Hounsfield density is 30. Status post cholecystectomy. Low pelvic ureteral insertions consistent with pelvic floor laxity. Disc levels: The discs are diffusely degenerated. Retropulsion at T12-L1 mildly effaces the left ventral hemicanal. The foramina are clear. At L1-2, right paracentral disc osteophyte complex and mild facet spurring encroaches on the right lateral recess without frank spinal stenosis. No foraminal compromise. L2-3 again demonstrating multifactorial severe spinal canal stenosis with facet hypertrophy and disc osteophyte complex. The foramina are moderately stenotic. Stable fusion changes L3-4 with laminectomy. L4-S1 advanced facet hypertrophy and mild to moderate multifactorial spinal canal stenosis. The foramina are clear. The facets are ankylosed. IMPRESSION: 1. No acute intracranial CT findings or depressed skull fractures. 2. Chronic changes. 3. Osteopenia, scoliosis and degenerative change of the thoracic and lumbar spine  without evidence of acute fractures. 4. 4 lumbar type vertebrae with the lumbosacral junction defined L4-S1. 5. Progressive L1 compression fracture with now up to 50% anterior and 30% posterior vertebral height loss and increased posterosuperior cortical retropulsion up to 4 mm lateralizing to the left. This now has a chronic appearance. 6. Stable surgical fusion change L3-4. 7. Multifactorial severe spinal canal stenosis at L2-3. 8. Mild to moderate multifactorial spinal canal stenosis at L4-S1. 9. Aortic and coronary artery atherosclerosis. 10. Stable 2 cm right adrenal nodule. 11. Hiatal hernia. 12. Patulous, mildly thickened esophagus. Aortic Atherosclerosis (ICD10-I70.0). Electronically Signed   By: Denman Fischer M.D.   On: 09/09/2023 03:36   CT LUMBAR SPINE WO CONTRAST Result Date: 09/09/2023 CLINICAL DATA:  Fall with head trauma and back pain. EXAM: CT HEAD WITHOUT CONTRAST CT THORACIC AND LUMBAR SPINE WITHOUT CONTRAST TECHNIQUE: Multidetector CT imaging of the head was performed following the standard protocol from the skull base to vertex without intravenous contrast. Multiplanar CT image reconstructions were also generated. Multidetector CT imaging of the thoracic and lumbar spine was performed without contrast. Multiplanar CT image reconstructions were also generated. RADIATION DOSE REDUCTION: This exam was performed according to the departmental dose-optimization program which includes automated exposure control, adjustment of the mA and/or kV according to patient size and/or use of iterative reconstruction technique. COMPARISON:  Head CT 05/06/2023, lumbar spine CT 10/21/2022, and CTA chest 10/04/2022. Also, PA and lateral chest 06/30/2023. FINDINGS: CT HEAD FINDINGS Brain: There is moderately advanced cerebral atrophy with atrophic ventriculomegaly and moderate to severe small vessel disease of the cerebral white matter. There are lacunar infarcts in the right caudate head and external capsules,  chronic. Additional chronic tiny lacunar infarcts in the thalami. The cerebellum and brainstem are unremarkable. No cortical based acute infarct, hemorrhage, mass or midline shift are seen. Basal cisterns are clear. Vascular: Patchy calcific plaques both siphons, both distal vertebral arteries. No hyperdense central vessels. Skull: Negative for fractures or focal lesions. Sinuses/orbits: Old lens replacements with otherwise negative orbits. Clear sinuses and mastoids. Other: None. CT THORACIC SPINE FINDINGS Alignment: Mild chronic thoracic spine kyphodextroscoliosis. Trace chronic anterolisthesis T6-7. No traumatic or further listhesis. Vertebrae: Osteopenia. Bridging osteophytes and multilevel interbody ankylosis related to bridging osteophytes again is seen from T4-5 through T10-11, with background advanced spondylosis. There is reactive sclerosis  in the vertebral bodies T4, T6, T8 and T10, which was seen previously and unchanged. No primary pathologic lesion or destructive vertebral mass is seen. There is spinous process enthesopathy, with spinous process bridging T8-9 and T10-11. Paraspinal and other soft tissues: The lungs show posterior atelectasis. There is no pleural effusion or pneumothorax in the visualized chest. Small caliber central airways which could be due to tracheobronchomalacia, respiration or bronchospasm. The esophagus is patulous, mildly thickened but no more than previously. Small to moderate-sized hiatal hernia. The heart is moderately enlarged. There are coronary artery calcifications. Aortic atherosclerosis and uncoiling. Disc levels: The discs are collapsed in the thoracic spine at all levels from T2-3 through T11-12. At T6-7 there is a tiny central calcified disc extrusion but no resulting mass effect. There is minimal posterior endplate ridging at most levels but this does not significantly encroach on the thecal sac. No herniated discs or cord compromise are suspected. There are facet  spurs in the lower third of the thoracic spine, at T10-11 causing moderate left foraminal stenosis, at T11-12 bilateral mild foraminal stenosis. The other foramina appear clear. Other: None. CT LUMBAR SPINE FINDINGS Segmentation: There are 4 lumbar type vertebrae. The lumbosacral junction is defined L4-S1. Alignment: Mild lumbar levoscoliosis apex at L1. Chronic grade 1 anterolisthesis of L4-S1 is unchanged. Minimal grade 1 retrolisthesis L1-2 also chronic. Vertebrae: The prior study demonstrated an acute L1 compression fracture but with only mild loss of vertebral height. This has progressed with now up to 50% anterior and 30% posterior vertebral height loss with increased posterosuperior cortical retropulsion up to 4 mm lateralizing to the left. No acute fracture is seen. Again seen is L3-4 posterior fusion hardware with rods and pedicle screws and laminectomy defect, with interbody metal hardware. The fusion appears solid. There is definitely solid fusion over the facet joints. Generalized osteopenia. There is advanced spondylosis. No focal pathologic lesion is suspected. Reactive endplate sclerosis again noted T11-12 to the left. Paraspinal and other soft tissues: No paraspinal mass, hemorrhage or fluid collection. Heavy aortoiliac calcific plaque. Stable 2 cm right adrenal nodule, Hounsfield density is 30. Status post cholecystectomy. Low pelvic ureteral insertions consistent with pelvic floor laxity. Disc levels: The discs are diffusely degenerated. Retropulsion at T12-L1 mildly effaces the left ventral hemicanal. The foramina are clear. At L1-2, right paracentral disc osteophyte complex and mild facet spurring encroaches on the right lateral recess without frank spinal stenosis. No foraminal compromise. L2-3 again demonstrating multifactorial severe spinal canal stenosis with facet hypertrophy and disc osteophyte complex. The foramina are moderately stenotic. Stable fusion changes L3-4 with laminectomy. L4-S1  advanced facet hypertrophy and mild to moderate multifactorial spinal canal stenosis. The foramina are clear. The facets are ankylosed. IMPRESSION: 1. No acute intracranial CT findings or depressed skull fractures. 2. Chronic changes. 3. Osteopenia, scoliosis and degenerative change of the thoracic and lumbar spine without evidence of acute fractures. 4. 4 lumbar type vertebrae with the lumbosacral junction defined L4-S1. 5. Progressive L1 compression fracture with now up to 50% anterior and 30% posterior vertebral height loss and increased posterosuperior cortical retropulsion up to 4 mm lateralizing to the left. This now has a chronic appearance. 6. Stable surgical fusion change L3-4. 7. Multifactorial severe spinal canal stenosis at L2-3. 8. Mild to moderate multifactorial spinal canal stenosis at L4-S1. 9. Aortic and coronary artery atherosclerosis. 10. Stable 2 cm right adrenal nodule. 11. Hiatal hernia. 12. Patulous, mildly thickened esophagus. Aortic Atherosclerosis (ICD10-I70.0). Electronically Signed   By: Aleck Hurdle.D.  On: 09/09/2023 03:36   CT THORACIC SPINE WO CONTRAST Result Date: 09/09/2023 CLINICAL DATA:  Fall with head trauma and back pain. EXAM: CT HEAD WITHOUT CONTRAST CT THORACIC AND LUMBAR SPINE WITHOUT CONTRAST TECHNIQUE: Multidetector CT imaging of the head was performed following the standard protocol from the skull base to vertex without intravenous contrast. Multiplanar CT image reconstructions were also generated. Multidetector CT imaging of the thoracic and lumbar spine was performed without contrast. Multiplanar CT image reconstructions were also generated. RADIATION DOSE REDUCTION: This exam was performed according to the departmental dose-optimization program which includes automated exposure control, adjustment of the mA and/or kV according to patient size and/or use of iterative reconstruction technique. COMPARISON:  Head CT 05/06/2023, lumbar spine CT 10/21/2022, and CTA  chest 10/04/2022. Also, PA and lateral chest 06/30/2023. FINDINGS: CT HEAD FINDINGS Brain: There is moderately advanced cerebral atrophy with atrophic ventriculomegaly and moderate to severe small vessel disease of the cerebral white matter. There are lacunar infarcts in the right caudate head and external capsules, chronic. Additional chronic tiny lacunar infarcts in the thalami. The cerebellum and brainstem are unremarkable. No cortical based acute infarct, hemorrhage, mass or midline shift are seen. Basal cisterns are clear. Vascular: Patchy calcific plaques both siphons, both distal vertebral arteries. No hyperdense central vessels. Skull: Negative for fractures or focal lesions. Sinuses/orbits: Old lens replacements with otherwise negative orbits. Clear sinuses and mastoids. Other: None. CT THORACIC SPINE FINDINGS Alignment: Mild chronic thoracic spine kyphodextroscoliosis. Trace chronic anterolisthesis T6-7. No traumatic or further listhesis. Vertebrae: Osteopenia. Bridging osteophytes and multilevel interbody ankylosis related to bridging osteophytes again is seen from T4-5 through T10-11, with background advanced spondylosis. There is reactive sclerosis in the vertebral bodies T4, T6, T8 and T10, which was seen previously and unchanged. No primary pathologic lesion or destructive vertebral mass is seen. There is spinous process enthesopathy, with spinous process bridging T8-9 and T10-11. Paraspinal and other soft tissues: The lungs show posterior atelectasis. There is no pleural effusion or pneumothorax in the visualized chest. Small caliber central airways which could be due to tracheobronchomalacia, respiration or bronchospasm. The esophagus is patulous, mildly thickened but no more than previously. Small to moderate-sized hiatal hernia. The heart is moderately enlarged. There are coronary artery calcifications. Aortic atherosclerosis and uncoiling. Disc levels: The discs are collapsed in the thoracic  spine at all levels from T2-3 through T11-12. At T6-7 there is a tiny central calcified disc extrusion but no resulting mass effect. There is minimal posterior endplate ridging at most levels but this does not significantly encroach on the thecal sac. No herniated discs or cord compromise are suspected. There are facet spurs in the lower third of the thoracic spine, at T10-11 causing moderate left foraminal stenosis, at T11-12 bilateral mild foraminal stenosis. The other foramina appear clear. Other: None. CT LUMBAR SPINE FINDINGS Segmentation: There are 4 lumbar type vertebrae. The lumbosacral junction is defined L4-S1. Alignment: Mild lumbar levoscoliosis apex at L1. Chronic grade 1 anterolisthesis of L4-S1 is unchanged. Minimal grade 1 retrolisthesis L1-2 also chronic. Vertebrae: The prior study demonstrated an acute L1 compression fracture but with only mild loss of vertebral height. This has progressed with now up to 50% anterior and 30% posterior vertebral height loss with increased posterosuperior cortical retropulsion up to 4 mm lateralizing to the left. No acute fracture is seen. Again seen is L3-4 posterior fusion hardware with rods and pedicle screws and laminectomy defect, with interbody metal hardware. The fusion appears solid. There is definitely solid fusion over  the facet joints. Generalized osteopenia. There is advanced spondylosis. No focal pathologic lesion is suspected. Reactive endplate sclerosis again noted T11-12 to the left. Paraspinal and other soft tissues: No paraspinal mass, hemorrhage or fluid collection. Heavy aortoiliac calcific plaque. Stable 2 cm right adrenal nodule, Hounsfield density is 30. Status post cholecystectomy. Low pelvic ureteral insertions consistent with pelvic floor laxity. Disc levels: The discs are diffusely degenerated. Retropulsion at T12-L1 mildly effaces the left ventral hemicanal. The foramina are clear. At L1-2, right paracentral disc osteophyte complex and  mild facet spurring encroaches on the right lateral recess without frank spinal stenosis. No foraminal compromise. L2-3 again demonstrating multifactorial severe spinal canal stenosis with facet hypertrophy and disc osteophyte complex. The foramina are moderately stenotic. Stable fusion changes L3-4 with laminectomy. L4-S1 advanced facet hypertrophy and mild to moderate multifactorial spinal canal stenosis. The foramina are clear. The facets are ankylosed. IMPRESSION: 1. No acute intracranial CT findings or depressed skull fractures. 2. Chronic changes. 3. Osteopenia, scoliosis and degenerative change of the thoracic and lumbar spine without evidence of acute fractures. 4. 4 lumbar type vertebrae with the lumbosacral junction defined L4-S1. 5. Progressive L1 compression fracture with now up to 50% anterior and 30% posterior vertebral height loss and increased posterosuperior cortical retropulsion up to 4 mm lateralizing to the left. This now has a chronic appearance. 6. Stable surgical fusion change L3-4. 7. Multifactorial severe spinal canal stenosis at L2-3. 8. Mild to moderate multifactorial spinal canal stenosis at L4-S1. 9. Aortic and coronary artery atherosclerosis. 10. Stable 2 cm right adrenal nodule. 11. Hiatal hernia. 12. Patulous, mildly thickened esophagus. Aortic Atherosclerosis (ICD10-I70.0). Electronically Signed   By: Denman Fischer M.D.   On: 09/09/2023 03:36   DG FEMUR MIN 2 VIEWS LEFT Result Date: 09/09/2023 CLINICAL DATA:  [id number] Closed intertrochanteric fracture, left, initial encounter (HCC) [id number] EXAM: LEFT FEMUR 2 VIEWS COMPARISON:  None Available. FINDINGS: Acute displaced left intertrochanteric femoral fracture. No fracture of the femur more distally. No hip dislocation. No knee dislocation. Severe tricompartmental degenerative changes of the knee. Vascular calcifications. IMPRESSION: 1. Acute displaced left intertrochanteric femoral fracture. 2. No fracture of the femur more distally.  3. Severe tricompartmental degenerative changes of the knee. Electronically Signed   By: Morgane  Naveau M.D.   On: 09/09/2023 00:58   DG HIP UNILAT WITH PELVIS 2-3 VIEWS LEFT Result Date: 09/08/2023 CLINICAL DATA:  Recent fall with left hip pain, initial encounter EXAM: DG HIP (WITH OR WITHOUT PELVIS) 3V LEFT COMPARISON:  None Available. FINDINGS: Pelvic ring is intact. Comminuted intratrochanteric left femoral fracture is noted with mild impaction and angulation at the fracture site. No soft tissue abnormality is seen. No other focal abnormality is noted. IMPRESSION: Comminuted left intratrochanteric fracture. Electronically Signed   By: Violeta Grey M.D.   On: 09/08/2023 23:59   DG Chest Port 1 View Result Date: 09/08/2023 CLINICAL DATA:  Recent fall with left hip pain, initial encounter EXAM: PORTABLE CHEST 1 VIEW COMPARISON:  06/30/2023 FINDINGS: Cardiac shadow is prominent but accentuated by the frontal technique. Aortic calcifications are seen. The lungs are clear but hypoinflated. No bony abnormality is noted. IMPRESSION: No active disease. Electronically Signed   By: Violeta Grey M.D.   On: 09/08/2023 23:59    Positive ROS: All other systems have been reviewed and were otherwise negative with the exception of those mentioned in the HPI and as above.  Physical Exam: General:  Alert, no acute distress Psychiatric: Patient is oriented to person and that she  is at a hospital and injured her left hip Cardiovascular:  No pedal edema Respiratory:  No wheezing, non-labored breathing GI:  Abdomen is soft and non-tender Skin:  No lesions in the area of chief complaint Neurologic:  Sensation intact distally Lymphatic:  No axillary or cervical lymphadenopathy  Orthopedic Exam:  Left lower extremity Skin intact over the hip tender to palpation over the lateral trochanter Externally rotated and shortened No tenderness palpation of the distal femur, knee, tibia, ankle or foot and toes Mild  diffuse nonpitting edema Neurovascular intact distally with soft compartments able to dorsiflex and plantarflex the foot and toes with an intact dorsalis pedis pulse  Secondary survey No tenderness to palpation over other bony prominences in the lower extremities or bilateral upper extremities No pain with logroll or simulated axial loading of the right lower extremity All compartments soft No tenderness to palpation over the cervical or thoracic spine, no bony step-off Motor grossly intact throughout, no focal deficits Sensation grossly intact throughout, no focal deficits Good distal pulses and capillary refill on all extremities   X-rays:  X-rays of the pelvis left hip and femur images and report reviewed myself.  There is a comminuted intertrochanteric fracture of the left femur with diffuse osteopenia.  No fractures noted to the distal femur or knee.  Degenerative changes noted to the left knee with joint space narrowing sclerosis and osteophyte formation.  Agree with radiologist interpretation.  Assessment: Displaced left intertrochanteric femur fracture  Plan: Moon is an 78 year old female presents with displaced left intertrochanteric fracture.  I discussed treatment options with the patient and her husband including operative and nonoperative treatment options.  Under shared decision making model and after discussing the risks of nonoperative treatment the patient and family agree with the plan for surgical intervention.  The patient will need a left hip intramedullary nail.  A long discussion took place with the patient describing what a intramedullary nail is and what the procedure would entail. The xrays were reviewed with the patient and the implants were discussed. The ability to secure the implant utilizing screws and blade fixation was discussed. Surgical exposures were discussed with the patient.    The hospitalization and post-operative care and rehabilitation were also  discussed. The use of perioperative antibiotics and DVT prophylaxis were discussed. The risk, benefits and alternatives to a surgical intervention were discussed at length with the patient. The patient was also advised of risks related to the medical comorbidities. A lengthy discussion took place to review the most common complications including but not limited to: deep vein thrombosis, pulmonary embolus, heart attack, stroke, infection, wound breakdown, dislocation, numbness, leg length in-equality, damage to nerves, intraoperative fracture, hardware cut out, hardware failure, malunion/ nonunion, tendon,muscles, arteries or other blood vessels, death and other possible complications from anesthesia. The patient was told that we will take steps to minimize these risks by using sterile technique, antibiotics and DVT prophylaxis when appropriate and follow the patient postoperatively in the office setting to monitor progress. The possibility of recurrent pain, no improvement in pain and actual worsening of pain were also discussed with the patient.      The benefits of surgery were discussed with the patient and husband including the potential for improving the patient's current clinical condition through operative intervention. Alternatives to surgical intervention including conservative management were also discussed in detail. All questions were answered to the satisfaction of the patient. The patient and husband participated and agreed to the plan of care as well as  the use of the recommended implants for their surgery.    Plan for surgery today 09/09/2023 N.p.o.  for the operating room Hold anticoagulation  Will plan to restart Eliquis  tomorrow, no signs or symptoms of DVT at this time, discussed with vascular who do not recommend IVCF evaluation in this setting as we plan to restart Eliquis  tomorrow and  no current concern for clots.  Consent was filled out over the phone with the husband and witnessed  by nurses at bedside and is on the chart  Venus Ginsberg MD     Venus Ginsberg MD  Beeper #:  [phone]  09/09/2023 11:54 AM

## 2023-09-09 NOTE — Progress Notes (Signed)
 Initial Nutrition Assessment  DOCUMENTATION CODES:   Obesity unspecified  INTERVENTION:   - Recommend Regular diet once diet advanced  - After diet advancement, provide Ensure Plus High Protein po BID, each supplement provides 350 kcal and 20 grams of protein  - Renal MVI daily given CKD stage IIIa  NUTRITION DIAGNOSIS:   Increased nutrient needs related to hip fracture as evidenced by estimated needs.  GOAL:   Patient will meet greater than or equal to 90% of their needs  MONITOR:   PO intake, Diet advancement, Supplement acceptance, Labs, Weight trends  REASON FOR ASSESSMENT:   Consult Hip fracture protocol  ASSESSMENT:   78 year old female who presented to the ED on 6/13 after a fall. PMH of CHF, PE on Eliquis , HTN, T2DM, hypothyroidism, GI bleed, CKD stage IIIa, lymphedema, ovarian cancer, lumbar spinal stenosis. Pt admitted with left hip fracture.  RD unable to reach pt via phone call to room. Noted plan for surgery with Orthopedics today. Pt is currently NPO. Per chart review, pt experiencing vomiting earlier this morning and was unable to take PO medications. PRN IV phenergan  ordered.  Unable to obtain diet history at this time. Pt last seen by an RD in July 2024 during which Ensure and Magic Cup supplements were ordered. Reviewed weight history in chart. If weights are accurate, pt with a 7.7 kg or 8% weight loss from 08/17/23 to 09/08/23. This is severe and significant for timeframe. Pt with mild pitting edema to BLE and non-pitting generalized edema which indicates that current weight may not be true dry weight. Pt at risk for developing malnutrition.  RD will order Ensure Plush High Protein supplements to start tomorrow once diet advanced. Will also order renal MVI daily given CKD stage IIIa. Recommend Regular diet order once diet advanced.  Medications reviewed and include: ferrous sulfate , SSI, levothyroxine , protonix , scopolamine patch, spironolactone , torsemide ,  IV phenergan  PRN  Labs reviewed: BUN 31, creatinine 1.59, WBC 13.9 CBG's: 169-193  NUTRITION - FOCUSED PHYSICAL EXAM:  Unable to complete. RD working remotely.  Diet Order:   Diet Order             Diet NPO time specified Except for: Sips with Meds, Ice Chips  Diet effective now                   EDUCATION NEEDS:   Not appropriate for education at this time  Skin:  Skin Assessment: Reviewed RN Assessment  Last BM:  09/07/23  Height:   Ht Readings from Last 1 Encounters:  09/08/23 5' (1.524 m)    Weight:   Wt Readings from Last 1 Encounters:  09/08/23 88.5 kg    BMI:  Body mass index is 38.12 kg/m.  Estimated Nutritional Needs:   Kcal:  1550-1750  Protein:  80-95 grams  Fluid:  1.6-1.8 L    Ernestina Headland, MS, RD, LDN Registered Dietitian II Please see [facility] for contact information.

## 2023-09-09 NOTE — Progress Notes (Addendum)
 PROGRESS NOTE    Stacy Kirk  VWU:[id number] DOB: 1946-02-18 DOA: 09/08/2023 PCP: Dorothe Gaster, NP    Assessment & Plan:   Principal Problem:   Closed left hip fracture, initial encounter [facility]) Active Problems:   Fall at home, initial encounter   Pulmonary embolism (HCC)   Chronic diastolic CHF (congestive heart failure) (HCC)   Hypertension   Chronic kidney disease, stage 3a (HCC)   Hypothyroidism   Type II diabetes mellitus with renal manifestations (HCC)   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-L5 level   Obesity (BMI 30-39.9)  Assessment and Plan:  Closed left hip fracture : Comminuted left intratrochanteric fracture. Will go for surg today. Percocet, morphine  prn for pain.    Fall: at home. CT head shows no acute intracranial findings  Urinary retention: retaining approx 500. Foley placed b/c pt going for surg today. Will try voiding trial tomorrow   Pulmonary embolism: holding home dose of eliquis     Chronic diastolic CHF: echo on 07/26/2023 showed EF of 50-55%. Appears compensated. Continue on torsemide , aldactone . Monitor I/Os   HTN: continue on home dose of metoprolol , aldactone , torsemide   CKDIIIa: Cr is trending down. Avoid nephrotoxic meds   Hypothyroidism: continue on home dose of levothyroxine     DM2: HbA1c 6.9, well-controlled. Continue on SSI w/ accuchecks    Lumbar stenosis: w/ chronic lower back pain, but now has worsening back pain in the middle back  Chronic L1 compression fracture: percocet, morphine  prn for pain. Will consult PT/OT after surg.    Obesity: BMI 38.1. Complicates overall care & prognosis         DVT prophylaxis: SCDs Code Status: full  Family Communication:  Disposition Plan: depends on PT/OT recs (not consulted yet)  Level of care: Telemetry Medical  Status is: Inpatient Remains inpatient appropriate because: severity of illness    Consultants:  Ortho surg   Procedures:   Antimicrobials:   Subjective: Pt c/o hip pain    Objective: Vitals:   09/08/23 2331 09/09/23 0206 09/09/23 0334 09/09/23 0733  BP:  (!) 163/97 (!) 156/98 (!) 161/95  Pulse:  (!) 56 (!) 107 (!) 108  Resp: 10 16 16 16   Temp:  98.1 F (36.7 C) 98.3 F (36.8 C) 97.9 F (36.6 C)  TempSrc:    Oral  SpO2:  98% 97% 92%  Weight:      Height:       No intake or output data in the 24 hours ending 09/09/23 0815 Filed Weights   09/08/23 2322  Weight: 88.5 kg    Examination:  General exam: Appears calm but uncomfortable  Respiratory system: Clear to auscultation. Respiratory effort normal. Cardiovascular system: S1 & S2+. No rubs, gallops or clicks. No pedal edema. Gastrointestinal system: Abdomen is obese, soft and nontender. Hypoactive bowel sounds heard. Central nervous system: Alert and oriented. Moves all extremities  Psychiatry: Judgement and insight appears at baseline. Flat mood and affect     Data Reviewed: I have personally reviewed following labs and imaging studies  CBC: Recent Labs  Lab 09/08/23 2329 09/09/23 0241  WBC 8.6 13.9*  NEUTROABS 4.8  --   HGB 12.0 12.3  HCT 38.8 39.7  MCV 82.7 82.7  PLT 300 294   Basic Metabolic Panel: Recent Labs  Lab 09/08/23 2329 09/09/23 0241  NA 137 138  K 3.5 3.7  CL 93* 94*  CO2 29 29  GLUCOSE 148* 171*  BUN 33* 31*  CREATININE 1.66* 1.59*  CALCIUM 9.2 9.5  GFR: Estimated Creatinine Clearance: 27.9 mL/min (A) (by C-G formula based on SCr of 1.59 mg/dL (H)). Liver Function Tests: No results for input(s): AST, ALT, ALKPHOS, BILITOT, PROT, ALBUMIN in the last 168 hours. No results for input(s): LIPASE, AMYLASE in the last 168 hours. No results for input(s): AMMONIA in the last 168 hours. Coagulation Profile: Recent Labs  Lab 09/08/23 2329  INR 1.4*   Cardiac Enzymes: No results for input(s): CKTOTAL, CKMB, CKMBINDEX, TROPONINI in the last 168 hours. BNP (last 3 results) Recent Labs    11/29/22 1028 12/16/22 1130 05/12/23 1407   PROBNP 106.0* 31.0 33.0   HbA1C: No results for input(s): HGBA1C in the last 72 hours. CBG: Recent Labs  Lab 09/09/23 0737  GLUCAP 169*   Lipid Profile: No results for input(s): CHOL, HDL, LDLCALC, TRIG, CHOLHDL, LDLDIRECT in the last 72 hours. Thyroid  Function Tests: No results for input(s): TSH, T4TOTAL, FREET4, T3FREE, THYROIDAB in the last 72 hours. Anemia Panel: No results for input(s): VITAMINB12, FOLATE, FERRITIN, TIBC, IRON , RETICCTPCT in the last 72 hours. Sepsis Labs: No results for input(s): PROCALCITON, LATICACIDVEN in the last 168 hours.  No results found for this or any previous visit (from the past 240 hours).       Radiology Studies: CT HEAD WO CONTRAST ( ) Result Date: 09/09/2023 CLINICAL DATA:  Fall with head trauma and back pain. EXAM: CT HEAD WITHOUT CONTRAST CT THORACIC AND LUMBAR SPINE WITHOUT CONTRAST TECHNIQUE: Multidetector CT imaging of the head was performed following the standard protocol from the skull base to vertex without intravenous contrast. Multiplanar CT image reconstructions were also generated. Multidetector CT imaging of the thoracic and lumbar spine was performed without contrast. Multiplanar CT image reconstructions were also generated. RADIATION DOSE REDUCTION: This exam was performed according to the departmental dose-optimization program which includes automated exposure control, adjustment of the mA and/or kV according to patient size and/or use of iterative reconstruction technique. COMPARISON:  Head CT 05/06/2023, lumbar spine CT 10/21/2022, and CTA chest 10/04/2022. Also, PA and lateral chest 06/30/2023. FINDINGS: CT HEAD FINDINGS Brain: There is moderately advanced cerebral atrophy with atrophic ventriculomegaly and moderate to severe small vessel disease of the cerebral white matter. There are lacunar infarcts in the right caudate head and external capsules, chronic. Additional chronic tiny  lacunar infarcts in the thalami. The cerebellum and brainstem are unremarkable. No cortical based acute infarct, hemorrhage, mass or midline shift are seen. Basal cisterns are clear. Vascular: Patchy calcific plaques both siphons, both distal vertebral arteries. No hyperdense central vessels. Skull: Negative for fractures or focal lesions. Sinuses/orbits: Old lens replacements with otherwise negative orbits. Clear sinuses and mastoids. Other: None. CT THORACIC SPINE FINDINGS Alignment: Mild chronic thoracic spine kyphodextroscoliosis. Trace chronic anterolisthesis T6-7. No traumatic or further listhesis. Vertebrae: Osteopenia. Bridging osteophytes and multilevel interbody ankylosis related to bridging osteophytes again is seen from T4-5 through T10-11, with background advanced spondylosis. There is reactive sclerosis in the vertebral bodies T4, T6, T8 and T10, which was seen previously and unchanged. No primary pathologic lesion or destructive vertebral mass is seen. There is spinous process enthesopathy, with spinous process bridging T8-9 and T10-11. Paraspinal and other soft tissues: The lungs show posterior atelectasis. There is no pleural effusion or pneumothorax in the visualized chest. Small caliber central airways which could be due to tracheobronchomalacia, respiration or bronchospasm. The esophagus is patulous, mildly thickened but no more than previously. Small to moderate-sized hiatal hernia. The heart is moderately enlarged. There are coronary artery calcifications. Aortic atherosclerosis and  uncoiling. Disc levels: The discs are collapsed in the thoracic spine at all levels from T2-3 through T11-12. At T6-7 there is a tiny central calcified disc extrusion but no resulting mass effect. There is minimal posterior endplate ridging at most levels but this does not significantly encroach on the thecal sac. No herniated discs or cord compromise are suspected. There are facet spurs in the lower third of the  thoracic spine, at T10-11 causing moderate left foraminal stenosis, at T11-12 bilateral mild foraminal stenosis. The other foramina appear clear. Other: None. CT LUMBAR SPINE FINDINGS Segmentation: There are 4 lumbar type vertebrae. The lumbosacral junction is defined L4-S1. Alignment: Mild lumbar levoscoliosis apex at L1. Chronic grade 1 anterolisthesis of L4-S1 is unchanged. Minimal grade 1 retrolisthesis L1-2 also chronic. Vertebrae: The prior study demonstrated an acute L1 compression fracture but with only mild loss of vertebral height. This has progressed with now up to 50% anterior and 30% posterior vertebral height loss with increased posterosuperior cortical retropulsion up to 4 mm lateralizing to the left. No acute fracture is seen. Again seen is L3-4 posterior fusion hardware with rods and pedicle screws and laminectomy defect, with interbody metal hardware. The fusion appears solid. There is definitely solid fusion over the facet joints. Generalized osteopenia. There is advanced spondylosis. No focal pathologic lesion is suspected. Reactive endplate sclerosis again noted T11-12 to the left. Paraspinal and other soft tissues: No paraspinal mass, hemorrhage or fluid collection. Heavy aortoiliac calcific plaque. Stable 2 cm right adrenal nodule, Hounsfield density is 30. Status post cholecystectomy. Low pelvic ureteral insertions consistent with pelvic floor laxity. Disc levels: The discs are diffusely degenerated. Retropulsion at T12-L1 mildly effaces the left ventral hemicanal. The foramina are clear. At L1-2, right paracentral disc osteophyte complex and mild facet spurring encroaches on the right lateral recess without frank spinal stenosis. No foraminal compromise. L2-3 again demonstrating multifactorial severe spinal canal stenosis with facet hypertrophy and disc osteophyte complex. The foramina are moderately stenotic. Stable fusion changes L3-4 with laminectomy. L4-S1 advanced facet hypertrophy and  mild to moderate multifactorial spinal canal stenosis. The foramina are clear. The facets are ankylosed. IMPRESSION: 1. No acute intracranial CT findings or depressed skull fractures. 2. Chronic changes. 3. Osteopenia, scoliosis and degenerative change of the thoracic and lumbar spine without evidence of acute fractures. 4. 4 lumbar type vertebrae with the lumbosacral junction defined L4-S1. 5. Progressive L1 compression fracture with now up to 50% anterior and 30% posterior vertebral height loss and increased posterosuperior cortical retropulsion up to 4 mm lateralizing to the left. This now has a chronic appearance. 6. Stable surgical fusion change L3-4. 7. Multifactorial severe spinal canal stenosis at L2-3. 8. Mild to moderate multifactorial spinal canal stenosis at L4-S1. 9. Aortic and coronary artery atherosclerosis. 10. Stable 2 cm right adrenal nodule. 11. Hiatal hernia. 12. Patulous, mildly thickened esophagus. Aortic Atherosclerosis (ICD10-I70.0). Electronically Signed   By: Denman Fischer M.D.   On: 09/09/2023 03:36   CT LUMBAR SPINE WO CONTRAST Result Date: 09/09/2023 CLINICAL DATA:  Fall with head trauma and back pain. EXAM: CT HEAD WITHOUT CONTRAST CT THORACIC AND LUMBAR SPINE WITHOUT CONTRAST TECHNIQUE: Multidetector CT imaging of the head was performed following the standard protocol from the skull base to vertex without intravenous contrast. Multiplanar CT image reconstructions were also generated. Multidetector CT imaging of the thoracic and lumbar spine was performed without contrast. Multiplanar CT image reconstructions were also generated. RADIATION DOSE REDUCTION: This exam was performed according to the departmental dose-optimization program which  includes automated exposure control, adjustment of the mA and/or kV according to patient size and/or use of iterative reconstruction technique. COMPARISON:  Head CT 05/06/2023, lumbar spine CT 10/21/2022, and CTA chest 10/04/2022. Also, PA and  lateral chest 06/30/2023. FINDINGS: CT HEAD FINDINGS Brain: There is moderately advanced cerebral atrophy with atrophic ventriculomegaly and moderate to severe small vessel disease of the cerebral white matter. There are lacunar infarcts in the right caudate head and external capsules, chronic. Additional chronic tiny lacunar infarcts in the thalami. The cerebellum and brainstem are unremarkable. No cortical based acute infarct, hemorrhage, mass or midline shift are seen. Basal cisterns are clear. Vascular: Patchy calcific plaques both siphons, both distal vertebral arteries. No hyperdense central vessels. Skull: Negative for fractures or focal lesions. Sinuses/orbits: Old lens replacements with otherwise negative orbits. Clear sinuses and mastoids. Other: None. CT THORACIC SPINE FINDINGS Alignment: Mild chronic thoracic spine kyphodextroscoliosis. Trace chronic anterolisthesis T6-7. No traumatic or further listhesis. Vertebrae: Osteopenia. Bridging osteophytes and multilevel interbody ankylosis related to bridging osteophytes again is seen from T4-5 through T10-11, with background advanced spondylosis. There is reactive sclerosis in the vertebral bodies T4, T6, T8 and T10, which was seen previously and unchanged. No primary pathologic lesion or destructive vertebral mass is seen. There is spinous process enthesopathy, with spinous process bridging T8-9 and T10-11. Paraspinal and other soft tissues: The lungs show posterior atelectasis. There is no pleural effusion or pneumothorax in the visualized chest. Small caliber central airways which could be due to tracheobronchomalacia, respiration or bronchospasm. The esophagus is patulous, mildly thickened but no more than previously. Small to moderate-sized hiatal hernia. The heart is moderately enlarged. There are coronary artery calcifications. Aortic atherosclerosis and uncoiling. Disc levels: The discs are collapsed in the thoracic spine at all levels from T2-3  through T11-12. At T6-7 there is a tiny central calcified disc extrusion but no resulting mass effect. There is minimal posterior endplate ridging at most levels but this does not significantly encroach on the thecal sac. No herniated discs or cord compromise are suspected. There are facet spurs in the lower third of the thoracic spine, at T10-11 causing moderate left foraminal stenosis, at T11-12 bilateral mild foraminal stenosis. The other foramina appear clear. Other: None. CT LUMBAR SPINE FINDINGS Segmentation: There are 4 lumbar type vertebrae. The lumbosacral junction is defined L4-S1. Alignment: Mild lumbar levoscoliosis apex at L1. Chronic grade 1 anterolisthesis of L4-S1 is unchanged. Minimal grade 1 retrolisthesis L1-2 also chronic. Vertebrae: The prior study demonstrated an acute L1 compression fracture but with only mild loss of vertebral height. This has progressed with now up to 50% anterior and 30% posterior vertebral height loss with increased posterosuperior cortical retropulsion up to 4 mm lateralizing to the left. No acute fracture is seen. Again seen is L3-4 posterior fusion hardware with rods and pedicle screws and laminectomy defect, with interbody metal hardware. The fusion appears solid. There is definitely solid fusion over the facet joints. Generalized osteopenia. There is advanced spondylosis. No focal pathologic lesion is suspected. Reactive endplate sclerosis again noted T11-12 to the left. Paraspinal and other soft tissues: No paraspinal mass, hemorrhage or fluid collection. Heavy aortoiliac calcific plaque. Stable 2 cm right adrenal nodule, Hounsfield density is 30. Status post cholecystectomy. Low pelvic ureteral insertions consistent with pelvic floor laxity. Disc levels: The discs are diffusely degenerated. Retropulsion at T12-L1 mildly effaces the left ventral hemicanal. The foramina are clear. At L1-2, right paracentral disc osteophyte complex and mild facet spurring encroaches on  the right  lateral recess without frank spinal stenosis. No foraminal compromise. L2-3 again demonstrating multifactorial severe spinal canal stenosis with facet hypertrophy and disc osteophyte complex. The foramina are moderately stenotic. Stable fusion changes L3-4 with laminectomy. L4-S1 advanced facet hypertrophy and mild to moderate multifactorial spinal canal stenosis. The foramina are clear. The facets are ankylosed. IMPRESSION: 1. No acute intracranial CT findings or depressed skull fractures. 2. Chronic changes. 3. Osteopenia, scoliosis and degenerative change of the thoracic and lumbar spine without evidence of acute fractures. 4. 4 lumbar type vertebrae with the lumbosacral junction defined L4-S1. 5. Progressive L1 compression fracture with now up to 50% anterior and 30% posterior vertebral height loss and increased posterosuperior cortical retropulsion up to 4 mm lateralizing to the left. This now has a chronic appearance. 6. Stable surgical fusion change L3-4. 7. Multifactorial severe spinal canal stenosis at L2-3. 8. Mild to moderate multifactorial spinal canal stenosis at L4-S1. 9. Aortic and coronary artery atherosclerosis. 10. Stable 2 cm right adrenal nodule. 11. Hiatal hernia. 12. Patulous, mildly thickened esophagus. Aortic Atherosclerosis (ICD10-I70.0). Electronically Signed   By: Denman Fischer M.D.   On: 09/09/2023 03:36   CT THORACIC SPINE WO CONTRAST Result Date: 09/09/2023 CLINICAL DATA:  Fall with head trauma and back pain. EXAM: CT HEAD WITHOUT CONTRAST CT THORACIC AND LUMBAR SPINE WITHOUT CONTRAST TECHNIQUE: Multidetector CT imaging of the head was performed following the standard protocol from the skull base to vertex without intravenous contrast. Multiplanar CT image reconstructions were also generated. Multidetector CT imaging of the thoracic and lumbar spine was performed without contrast. Multiplanar CT image reconstructions were also generated. RADIATION DOSE REDUCTION: This exam  was performed according to the departmental dose-optimization program which includes automated exposure control, adjustment of the mA and/or kV according to patient size and/or use of iterative reconstruction technique. COMPARISON:  Head CT 05/06/2023, lumbar spine CT 10/21/2022, and CTA chest 10/04/2022. Also, PA and lateral chest 06/30/2023. FINDINGS: CT HEAD FINDINGS Brain: There is moderately advanced cerebral atrophy with atrophic ventriculomegaly and moderate to severe small vessel disease of the cerebral white matter. There are lacunar infarcts in the right caudate head and external capsules, chronic. Additional chronic tiny lacunar infarcts in the thalami. The cerebellum and brainstem are unremarkable. No cortical based acute infarct, hemorrhage, mass or midline shift are seen. Basal cisterns are clear. Vascular: Patchy calcific plaques both siphons, both distal vertebral arteries. No hyperdense central vessels. Skull: Negative for fractures or focal lesions. Sinuses/orbits: Old lens replacements with otherwise negative orbits. Clear sinuses and mastoids. Other: None. CT THORACIC SPINE FINDINGS Alignment: Mild chronic thoracic spine kyphodextroscoliosis. Trace chronic anterolisthesis T6-7. No traumatic or further listhesis. Vertebrae: Osteopenia. Bridging osteophytes and multilevel interbody ankylosis related to bridging osteophytes again is seen from T4-5 through T10-11, with background advanced spondylosis. There is reactive sclerosis in the vertebral bodies T4, T6, T8 and T10, which was seen previously and unchanged. No primary pathologic lesion or destructive vertebral mass is seen. There is spinous process enthesopathy, with spinous process bridging T8-9 and T10-11. Paraspinal and other soft tissues: The lungs show posterior atelectasis. There is no pleural effusion or pneumothorax in the visualized chest. Small caliber central airways which could be due to tracheobronchomalacia, respiration or  bronchospasm. The esophagus is patulous, mildly thickened but no more than previously. Small to moderate-sized hiatal hernia. The heart is moderately enlarged. There are coronary artery calcifications. Aortic atherosclerosis and uncoiling. Disc levels: The discs are collapsed in the thoracic spine at all levels from T2-3 through T11-12. At  T6-7 there is a tiny central calcified disc extrusion but no resulting mass effect. There is minimal posterior endplate ridging at most levels but this does not significantly encroach on the thecal sac. No herniated discs or cord compromise are suspected. There are facet spurs in the lower third of the thoracic spine, at T10-11 causing moderate left foraminal stenosis, at T11-12 bilateral mild foraminal stenosis. The other foramina appear clear. Other: None. CT LUMBAR SPINE FINDINGS Segmentation: There are 4 lumbar type vertebrae. The lumbosacral junction is defined L4-S1. Alignment: Mild lumbar levoscoliosis apex at L1. Chronic grade 1 anterolisthesis of L4-S1 is unchanged. Minimal grade 1 retrolisthesis L1-2 also chronic. Vertebrae: The prior study demonstrated an acute L1 compression fracture but with only mild loss of vertebral height. This has progressed with now up to 50% anterior and 30% posterior vertebral height loss with increased posterosuperior cortical retropulsion up to 4 mm lateralizing to the left. No acute fracture is seen. Again seen is L3-4 posterior fusion hardware with rods and pedicle screws and laminectomy defect, with interbody metal hardware. The fusion appears solid. There is definitely solid fusion over the facet joints. Generalized osteopenia. There is advanced spondylosis. No focal pathologic lesion is suspected. Reactive endplate sclerosis again noted T11-12 to the left. Paraspinal and other soft tissues: No paraspinal mass, hemorrhage or fluid collection. Heavy aortoiliac calcific plaque. Stable 2 cm right adrenal nodule, Hounsfield density is 30.  Status post cholecystectomy. Low pelvic ureteral insertions consistent with pelvic floor laxity. Disc levels: The discs are diffusely degenerated. Retropulsion at T12-L1 mildly effaces the left ventral hemicanal. The foramina are clear. At L1-2, right paracentral disc osteophyte complex and mild facet spurring encroaches on the right lateral recess without frank spinal stenosis. No foraminal compromise. L2-3 again demonstrating multifactorial severe spinal canal stenosis with facet hypertrophy and disc osteophyte complex. The foramina are moderately stenotic. Stable fusion changes L3-4 with laminectomy. L4-S1 advanced facet hypertrophy and mild to moderate multifactorial spinal canal stenosis. The foramina are clear. The facets are ankylosed. IMPRESSION: 1. No acute intracranial CT findings or depressed skull fractures. 2. Chronic changes. 3. Osteopenia, scoliosis and degenerative change of the thoracic and lumbar spine without evidence of acute fractures. 4. 4 lumbar type vertebrae with the lumbosacral junction defined L4-S1. 5. Progressive L1 compression fracture with now up to 50% anterior and 30% posterior vertebral height loss and increased posterosuperior cortical retropulsion up to 4 mm lateralizing to the left. This now has a chronic appearance. 6. Stable surgical fusion change L3-4. 7. Multifactorial severe spinal canal stenosis at L2-3. 8. Mild to moderate multifactorial spinal canal stenosis at L4-S1. 9. Aortic and coronary artery atherosclerosis. 10. Stable 2 cm right adrenal nodule. 11. Hiatal hernia. 12. Patulous, mildly thickened esophagus. Aortic Atherosclerosis (ICD10-I70.0). Electronically Signed   By: Denman Fischer M.D.   On: 09/09/2023 03:36   DG FEMUR MIN 2 VIEWS LEFT Result Date: 09/09/2023 CLINICAL DATA:  [id number] Closed intertrochanteric fracture, left, initial encounter (HCC) [id number] EXAM: LEFT FEMUR 2 VIEWS COMPARISON:  None Available. FINDINGS: Acute displaced left intertrochanteric  femoral fracture. No fracture of the femur more distally. No hip dislocation. No knee dislocation. Severe tricompartmental degenerative changes of the knee. Vascular calcifications. IMPRESSION: 1. Acute displaced left intertrochanteric femoral fracture. 2. No fracture of the femur more distally. 3. Severe tricompartmental degenerative changes of the knee. Electronically Signed   By: Morgane  Naveau M.D.   On: 09/09/2023 00:58   DG HIP UNILAT WITH PELVIS 2-3 VIEWS LEFT Result Date: 09/08/2023 CLINICAL DATA:  Recent fall with left hip pain, initial encounter EXAM: DG HIP (WITH OR WITHOUT PELVIS) 3V LEFT COMPARISON:  None Available. FINDINGS: Pelvic ring is intact. Comminuted intratrochanteric left femoral fracture is noted with mild impaction and angulation at the fracture site. No soft tissue abnormality is seen. No other focal abnormality is noted. IMPRESSION: Comminuted left intratrochanteric fracture. Electronically Signed   By: Violeta Grey M.D.   On: 09/08/2023 23:59   DG Chest Port 1 View Result Date: 09/08/2023 CLINICAL DATA:  Recent fall with left hip pain, initial encounter EXAM: PORTABLE CHEST 1 VIEW COMPARISON:  06/30/2023 FINDINGS: Cardiac shadow is prominent but accentuated by the frontal technique. Aortic calcifications are seen. The lungs are clear but hypoinflated. No bony abnormality is noted. IMPRESSION: No active disease. Electronically Signed   By: Violeta Grey M.D.   On: 09/08/2023 23:59        Scheduled Meds:  ferrous sulfate   325 mg Oral Q breakfast   insulin  aspart  0-5 Units Subcutaneous QHS   insulin  aspart  0-9 Units Subcutaneous TID WC   levothyroxine   100 mcg Oral Q0600   lidocaine   1 patch Transdermal QHS   loratadine   10 mg Oral Daily   metoprolol  succinate  25 mg Oral Daily   pantoprazole   40 mg Oral Daily   spironolactone   25 mg Oral Daily   torsemide   40 mg Oral Daily   Continuous Infusions:   LOS: 0 days       Alphonsus Jeans, MD [facility] Pager [phone]  If 7PM-7AM, please contact night-coverage [email or web address] 09/09/2023, 8:15 AM

## 2023-09-09 NOTE — Anesthesia Procedure Notes (Signed)
 Procedure Name: Intubation Date/Time: 09/09/2023 1:55 PM  Performed by: Bobette Burrs, CRNAPre-anesthesia Checklist: Patient identified, Emergency Drugs available, Suction available and Patient being monitored Patient Re-evaluated:Patient Re-evaluated prior to induction Oxygen Delivery Method: Circle system utilized Preoxygenation: Pre-oxygenation with 100% oxygen Induction Type: IV induction and Rapid sequence Ventilation: Mask ventilation without difficulty Laryngoscope Size: McGrath and 3 Grade View: Grade I Tube type: Oral Tube size: 7.0 mm Number of attempts: 1 Airway Equipment and Method: Stylet, Oral airway and Bite block Placement Confirmation: ETT inserted through vocal cords under direct vision, positive ETCO2 and breath sounds checked- equal and bilateral Secured at: 21 cm Tube secured with: Tape Dental Injury: Teeth and Oropharynx as per pre-operative assessment

## 2023-09-09 NOTE — Anesthesia Preprocedure Evaluation (Addendum)
 Anesthesia Evaluation  Patient identified by MRN, date of birth, ID band Patient awake  General Assessment Comment:On 2 L supplemental oxygen. She is alert but unaware of date. She complains of nausea. Denies CP/SOB.   Reviewed: Allergy & Precautions, H&P , NPO status , Patient's Chart, lab work & pertinent test results  Airway Mallampati: III  TM Distance: >3 FB Neck ROM: full    Dental  (+) Missing   Pulmonary former smoker   Pulmonary exam normal        Cardiovascular hypertension, Pt. on home beta blockers (-) angina +CHF (dCHF)   Rhythm:Regular Rate:Tachycardia  ECHO 4/25:  1. Left ventricular ejection fraction, by estimation, is 50 to 55%. The  left ventricle has low normal function. The left ventricle has no regional  wall motion abnormalities. There is moderate left ventricular hypertrophy.  Left ventricular diastolic  parameters are indeterminate.   2. Right ventricular systolic function is normal. The right ventricular  size is normal. Tricuspid regurgitation signal is inadequate for assessing  PA pressure.   3. The mitral valve is normal in structure. No evidence of mitral valve  regurgitation. No evidence of mitral stenosis.   4. The aortic valve was not assessed. Aortic valve regurgitation is not  visualized. Degreee of stenosis not measured.   5. Exam stopped prematurely by patient.   h/o PE on Eliquis     Neuro/Psych negative neurological ROS  negative psych ROS   GI/Hepatic Neg liver ROS, hiatal hernia,,,  Endo/Other  diabetes, Type 2Hypothyroidism    Renal/GU Renal InsufficiencyRenal disease     Musculoskeletal  (+) Arthritis ,    Abdominal  (+) + obese Abdomen: soft.   Peds  Hematology negative hematology ROS (+)   Anesthesia Other Findings  lumbar spinal stenosis H/o ovarian cancer  Pt is on Eliquis , last dose was in AM of 6/13  Past Medical History: No date: Arthritis No date:  Cancer [facility])     Comment:  ovarian, skin face- skin cancer No date: Complication of anesthesia     Comment:  slow to awaken everday, even slower with anesthesia No date: Constipation No date: Diabetes mellitus     Comment:  Type II No date: GERD (gastroesophageal reflux disease) 05/12/2015: Headache     Comment:  after a fall headache for 8 months No date: Hemorrhoids No date: Hernia No date: History of kidney stones     Comment:  passed No date: Hypertension No date: Hypothyroidism No date: Loss of appetite No date: Muscle spasm     Comment:  back No date: Osteoporosis No date: Redness     Comment:  abd wound No date: Urine frequency     Comment:  unable to hold urine  No date: Wears hearing aid     Comment:  bilateral   Past Surgical History: No date: ABDOMINAL HYSTERECTOMY No date: APPENDECTOMY No date: CHOLECYSTECTOMY No date: COLONOSCOPY W/ POLYPECTOMY Jun2012: HERNIA REPAIR     Comment:  open component separation w biologic mesh (Dr. Afton Horse) No date: TOE SURGERY; Left     Comment:  2nd toe No date: TONSILLECTOMY  BMI    Body Mass Index: 38.12 kg/m      Reproductive/Obstetrics negative OB ROS                              Anesthesia Physical Anesthesia Plan  ASA: 3  Anesthesia Plan: General ETT   Post-op Pain Management: Tylenol  PO (pre-op)*  and Precedex   Induction: Intravenous and Rapid sequence  PONV Risk Score and Plan: 2 and Ondansetron  and Dexamethasone  Airway Management Planned: Oral ETT  Additional Equipment:   Intra-op Plan:   Post-operative Plan: Extubation in OR  Informed Consent: I have reviewed the patients History and Physical, chart, labs and discussed the procedure including the risks, benefits and alternatives for the proposed anesthesia with the patient or authorized representative who has indicated his/her understanding and acceptance.     Dental Advisory Given and Interpreter used for  interview  Plan Discussed with: CRNA and Surgeon  Anesthesia Plan Comments:          Anesthesia Quick Evaluation

## 2023-09-09 NOTE — Anesthesia Postprocedure Evaluation (Addendum)
 Anesthesia Post Note  Patient: Stacy Kirk  Procedure(s) Performed: FIXATION, FRACTURE, INTERTROCHANTERIC, WITH INTRAMEDULLARY ROD (Left: Hip)  Patient location during evaluation: PACU Anesthesia Type: General Level of consciousness: awake and alert Pain management: pain level controlled Vital Signs Assessment: post-procedure vital signs reviewed and stable Respiratory status: spontaneous breathing, nonlabored ventilation, respiratory function stable and patient connected to nasal cannula oxygen Cardiovascular status: blood pressure returned to baseline and stable Postop Assessment: no apparent nausea or vomiting Anesthetic complications: no   No notable events documented.   Last Vitals:  Vitals:   09/09/23 1630 09/09/23 2019  BP: 137/75 124/87  Pulse: 98 (!) 110  Resp: 16 18  Temp: 36.7 C 36.5 C  SpO2: 99% 98%    Last Pain:  Vitals:   09/09/23 2019  TempSrc: Oral  PainSc:                  Baltazar Bonier

## 2023-09-09 NOTE — Plan of Care (Signed)
  Problem: Education: Goal: Ability to describe self-care measures that may prevent or decrease complications (Diabetes Survival Skills Education) will improve Outcome: Progressing Goal: Individualized Educational Video(s) Outcome: Progressing   Problem: Coping: Goal: Ability to adjust to condition or change in health will improve Outcome: Progressing   Problem: Fluid Volume: Goal: Ability to maintain a balanced intake and output will improve Outcome: Progressing   Problem: Pain Managment: Goal: General experience of comfort will improve and/or be controlled Outcome: Progressing   Problem: Safety: Goal: Ability to remain free from injury will improve Outcome: Progressing   Problem: Skin Integrity: Goal: Risk for impaired skin integrity will decrease Outcome: Progressing

## 2023-09-10 DIAGNOSIS — S72002A Fracture of unspecified part of neck of left femur, initial encounter for closed fracture: Secondary | ICD-10-CM | POA: Diagnosis not present

## 2023-09-10 LAB — BASIC METABOLIC PANEL WITH GFR
Anion gap: 10 (ref 5–15)
BUN: 31 mg/dL — ABNORMAL HIGH (ref 8–23)
CO2: 30 mmol/L (ref 22–32)
Calcium: 8.7 mg/dL — ABNORMAL LOW (ref 8.9–10.3)
Chloride: 94 mmol/L — ABNORMAL LOW (ref 98–111)
Creatinine, Ser: 1.54 mg/dL — ABNORMAL HIGH (ref 0.44–1.00)
GFR, Estimated: 34 mL/min — ABNORMAL LOW (ref >60)
Glucose, Bld: 174 mg/dL — ABNORMAL HIGH (ref 70–99)
Potassium: 4 mmol/L (ref 3.5–5.1)
Sodium: 134 mmol/L — ABNORMAL LOW (ref 135–145)

## 2023-09-10 LAB — CBC
HCT: 31.9 % — ABNORMAL LOW (ref 36.0–46.0)
Hemoglobin: 10.2 g/dL — ABNORMAL LOW (ref 12.0–15.0)
MCH: 26.5 pg (ref 26.0–34.0)
MCHC: 32 g/dL (ref 30.0–36.0)
MCV: 82.9 fL (ref 80.0–100.0)
Platelets: 252 10*3/uL (ref 150–400)
RBC: 3.85 MIL/uL — ABNORMAL LOW (ref 3.87–5.11)
RDW: 20.3 % — ABNORMAL HIGH (ref 11.5–15.5)
WBC: 12.3 10*3/uL — ABNORMAL HIGH (ref 4.0–10.5)
nRBC: 0 % (ref 0.0–0.2)

## 2023-09-10 LAB — GLUCOSE, CAPILLARY
Glucose-Capillary: 140 mg/dL — ABNORMAL HIGH (ref 70–99)
Glucose-Capillary: 129 mg/dL — ABNORMAL HIGH (ref 70–99)
Glucose-Capillary: 119 mg/dL — ABNORMAL HIGH (ref 70–99)
Glucose-Capillary: 118 mg/dL — ABNORMAL HIGH (ref 70–99)

## 2023-09-10 NOTE — Evaluation (Addendum)
 Physical Therapy Evaluation Patient Details Name: Stacy Kirk MRN: [id number] DOB: [birth year] Today's Date: 09/10/2023  History of Present Illness  presented to ER secondary to mechanical fall going to bathroom, acute onset of L hip, LBP; admitted for management of L1 compression fracture, comminuted L intertrochanteric hip fracture, s/p L IMN (09/09/23), WBAT.  Clinical Impression  Patient resting in bed upon arrival to room; alert and oriented to self only.  Follows some simple commands, but requires near-constant reorientation to situation and goals of session.  Patient very anxious and fearful of falling, pain-limited; requires constant encouragement from therapist.  L LE pain grossly 7-8/10 (PAINAD); medicated prior to session (but may benefit from stronger in subsequent sessions), improves with rest and repositioning. L LE grossly 2-/5, very guarded and pain-limited; requiring act assist from therapist throughout session. Currently requiring total assist +2 for bed mobility; min/mod assist +2 for sit/stand and static standing with RW (x5-10 seconds).  Demonstrates broad BOS, bilat UEs on RW to promote sit/stand (x3); very anxious and fearful of falling, quick to sit spontaneously (constant redirection required).  Excessive L ankle supination in stance, WBing only on lateral border of L foot.  Unsafe/unable to progress additional mobility efforts at this time; will continue to assess/progress in subsequent sessions as appropriate.  Do anticipate +2 for safety progression of mobility. Would benefit from skilled PT to address above deficits and promote optimal return to PLOF.; recommend post-acute PT follow up as indicated by interdisciplinary care team.     Of note, did trial patient on RA during session with noted desat to 86%.  Reapplied O2 at 2L for recovery to >90%; left on RA end of session.  RN informed/aware.       If plan is discharge home, recommend the following: Two people to  help with walking and/or transfers;Two people to help with bathing/dressing/bathroom   Can travel by private vehicle   No    Equipment Recommendations    Recommendations for Other Services       Functional Status Assessment Patient has had a recent decline in their functional status and demonstrates the ability to make significant improvements in function in a reasonable and predictable amount of time.     Precautions / Restrictions Precautions Precautions: Fall Restrictions Weight Bearing Restrictions Per Provider Order: Yes LLE Weight Bearing Per Provider Order: Weight bearing as tolerated      Mobility  Bed Mobility Overal bed mobility: Needs Assistance Bed Mobility: Supine to Sit, Sit to Supine     Supine to sit: Total assist, +2 for physical assistance Sit to supine: Total assist, +2 for physical assistance        Transfers Overall transfer level: Needs assistance Equipment used: Rolling walker (2 wheels) Transfers: Sit to/from Stand Sit to Stand: Min assist, Mod assist, +2 physical assistance           General transfer comment: broad BOS, bilat UEs on RW to promote sit/stand (x3); very anxious and fearful of falling, quick to sit spontaneously (constant redirection required).  Excessive L ankle supination in stance, WBing only on lateral border of L foot    Ambulation/Gait               General Gait Details: unsafe/unable  Stairs            Wheelchair Mobility     Tilt Bed    Modified Rankin (Stroke Patients Only)       Balance Overall balance assessment: Needs assistance Sitting-balance support: No  upper extremity supported, Feet supported Sitting balance-Leahy Scale: Good     Standing balance support: Bilateral upper extremity supported Standing balance-Leahy Scale: Fair                               Pertinent Vitals/Pain Pain Assessment Pain Assessment: PAINAD Breathing: occasional labored breathing, short  period of hyperventilation Negative Vocalization: repeated troubled calling out, loud moaning/groaning, crying Facial Expression: facial grimacing Body Language: rigid, fists clenched, knees up, pushing/pulling away, strikes out Consolability: distracted or reassured by voice/touch PAINAD Score: 8 Pain Intervention(s): Limited activity within patient's tolerance, Monitored during session, Repositioned, Premedicated before session    Home Living Family/patient expects to be discharged to:: Private residence Living Arrangements: Spouse/significant other Available Help at Discharge: Family;Available PRN/intermittently Type of Home: House         Home Layout: One level Home Equipment: Cane - single point;Grab bars - toilet;Grab bars - tub/shower;Rolling Walker (2 wheels)      Prior Function Prior Level of Function : Independent/Modified Independent;Patient poor historian/Family not available             Mobility Comments: Patient poor historian.  Endorses gait with RW; will confirm additional details from family as appropriate.       Extremity/Trunk Assessment   Upper Extremity Assessment Upper Extremity Assessment: Overall WFL for tasks assessed    Lower Extremity Assessment Lower Extremity Assessment: Generalized weakness;LLE deficits/detail LLE Deficits / Details: grossly at least 2/5 throughout, very guarded and pain-limited       Communication   Communication Communication: No apparent difficulties    Cognition Arousal: Alert Behavior During Therapy: Restless, Anxious   PT - Cognitive impairments: No family/caregiver present to determine baseline                       PT - Cognition Comments: Oriented to self only; follows some simple commands.  Poor/absent insight into situation; constant redirection to task Following commands: Impaired Following commands impaired: Follows one step commands inconsistently     Cueing Cueing Techniques: Verbal  cues, Gestural cues, Tactile cues     General Comments      Exercises     Assessment/Plan    PT Assessment Patient needs continued PT services  PT Problem List Decreased strength;Decreased range of motion;Decreased activity tolerance;Decreased balance;Decreased mobility;Decreased coordination;Decreased cognition;Decreased knowledge of use of DME;Decreased safety awareness;Decreased knowledge of precautions;Pain;Obesity       PT Treatment Interventions DME instruction;Gait training;Stair training;Functional mobility training;Therapeutic activities;Balance training;Therapeutic exercise;Patient/family education    PT Goals (Current goals can be found in the Care Plan section)  Acute Rehab PT Goals PT Goal Formulation: Patient unable to participate in goal setting Time For Goal Achievement: 09/24/23 Potential to Achieve Goals: Fair    Frequency 7X/week     Co-evaluation               AM-PAC PT 6 Clicks Mobility  Outcome Measure Help needed turning from your back to your side while in a flat bed without using bedrails?: Total Help needed moving from lying on your back to sitting on the side of a flat bed without using bedrails?: Total Help needed moving to and from a bed to a chair (including a wheelchair)?: Total Help needed standing up from a chair using your arms (e.g., wheelchair or bedside chair)?: A Lot Help needed to walk in hospital room?: Total Help needed climbing 3-5 steps with a railing? :  Total 6 Click Score: 7    End of Session Equipment Utilized During Treatment: Gait belt Activity Tolerance: Patient tolerated treatment well Patient left: with call bell/phone within reach;in bed;with bed alarm set Nurse Communication: Mobility status PT Visit Diagnosis: Muscle weakness (generalized) (M62.81);Difficulty in walking, not elsewhere classified (R26.2);Pain Pain - Right/Left: Left Pain - part of body: Hip    Time: 4098-1191 PT Time Calculation (min) (ACUTE  ONLY): 24 min   Charges:   PT Evaluation $PT Eval Moderate Complexity: 1 Mod PT Treatments $Therapeutic Activity: 8-22 mins PT General Charges $$ ACUTE PT VISIT: 1 Visit        Saarah Dewing H. Bevin Bucks, PT, DPT, NCS 09/10/23, 3:27 PM [phone]

## 2023-09-10 NOTE — Plan of Care (Signed)
  Problem: Fluid Volume: Goal: Ability to maintain a balanced intake and output will improve Outcome: Progressing   Problem: Nutritional: Goal: Maintenance of adequate nutrition will improve Outcome: Progressing   Problem: Clinical Measurements: Goal: Respiratory complications will improve Outcome: Progressing   Problem: Education: Goal: Ability to describe self-care measures that may prevent or decrease complications (Diabetes Survival Skills Education) will improve Outcome: Not Progressing   Problem: Coping: Goal: Ability to adjust to condition or change in health will improve Outcome: Not Progressing   Problem: Health Behavior/Discharge Planning: Goal: Ability to manage health-related needs will improve Outcome: Not Progressing   Problem: Nutritional: Goal: Progress toward achieving an optimal weight will improve Outcome: Not Progressing   Problem: Education: Goal: Knowledge of General Education information will improve Description: Including pain rating scale, medication(s)/side effects and non-pharmacologic comfort measures Outcome: Not Progressing   Problem: Activity: Goal: Risk for activity intolerance will decrease Outcome: Not Progressing   Problem: Coping: Goal: Level of anxiety will decrease Outcome: Not Progressing

## 2023-09-10 NOTE — Progress Notes (Signed)
 Subjective: 1 Day Post-Op Procedure(s) (LRB): FIXATION, FRACTURE, INTERTROCHANTERIC, WITH INTRAMEDULLARY ROD (Left) Patient reports pain as mild.   Patient is well, and has had no acute complaints or problems Plan is to go Rehab after hospital stay. Negative for chest pain and shortness of breath Fever: no Gastrointestinal:Negative for nausea and vomiting  Objective: Vital signs in last 24 hours: Temp:  [97.7 F (36.5 C)-98.8 F (37.1 C)] 98.8 F (37.1 C) (06/15 0359) Pulse Rate:  [96-110] 96 (06/15 0359) Resp:  [16-24] 18 (06/15 0359) BP: (97-161)/(59-95) 97/59 (06/15 0359) SpO2:  [92 %-100 %] 95 % (06/15 0359)  Intake/Output from previous day:  Intake/Output Summary (Last 24 hours) at 09/10/2023 0701 Last data filed at 09/10/2023 0420 Gross per 24 hour  Intake 2200 ml  Output 850 ml  Net 1350 ml    Intake/Output this shift: No intake/output data recorded.  Labs: Recent Labs    09/08/23 2329 09/09/23 0241 09/10/23 0338  HGB 12.0 12.3 10.2*   Recent Labs    09/09/23 0241 09/10/23 0338  WBC 13.9* 12.3*  RBC 4.80 3.85*  HCT 39.7 31.9*  PLT 294 252   Recent Labs    09/09/23 0241 09/10/23 0338  NA 138 134*  K 3.7 4.0  CL 94* 94*  CO2 29 30  BUN 31* 31*  CREATININE 1.59* 1.54*  GLUCOSE 171* 174*  CALCIUM 9.5 8.7*   Recent Labs    09/08/23 2329  INR 1.4*     EXAM General - Patient is Alert and Confused Extremity - Neurovascular intact Sensation intact distally Dorsiflexion/Plantar flexion intact Compartment soft Dressing/Incision - clean, dry, no drainage Motor Function - intact, moving foot and toes well on exam.   Past Medical History:  Diagnosis Date   Arthritis    Cancer (HCC)    ovarian, skin face- skin cancer   Complication of anesthesia    slow to awaken everday, even slower with anesthesia   Constipation    Diabetes mellitus    Type II   GERD (gastroesophageal reflux disease)    Headache 05/12/2015   after a fall headache  for 8 months   Hemorrhoids    Hernia    History of kidney stones    passed   Hypertension    Hypothyroidism    Loss of appetite    Muscle spasm    back   Osteoporosis    Redness    abd wound   Urine frequency    unable to hold urine    Wears hearing aid    bilateral     Assessment/Plan: 1 Day Post-Op Procedure(s) (LRB): FIXATION, FRACTURE, INTERTROCHANTERIC, WITH INTRAMEDULLARY ROD (Left) Principal Problem:   Closed left hip fracture, initial encounter (HCC) Active Problems:   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   Weight as of this encounter: 88.5 kg. Advance diet Up with therapy  Discharge planning:  Dressing change as needed.  Follow up in 2 weeks with [facility] for wound care  DVT Prophylaxis - TED  hose and Eliquis  Weight-Bearing as tolerated to left leg  Bert Britain, PA-C Orthopaedic Surgery 09/10/2023, 7:01 AM

## 2023-09-10 NOTE — Progress Notes (Signed)
 PROGRESS NOTE    Stacy Kirk  WUJ:[id number] DOB: 12-20-1945 DOA: 09/08/2023 PCP: Dorothe Gaster, NP    Assessment & Plan:   Principal Problem:   Closed left hip fracture, initial encounter [facility]) Active Problems:   Fall at home, initial encounter   Pulmonary embolism (HCC)   Chronic diastolic CHF (congestive heart failure) (HCC)   Hypertension   Chronic kidney disease, stage 3a (HCC)   Hypothyroidism   Type II diabetes mellitus with renal manifestations (HCC)   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-L5 level   Obesity (BMI 30-39.9)   Closed comminuted intertrochanteric fracture of proximal end of left femur (HCC)  Assessment and Plan:  Closed left hip fracture : Comminuted left intratrochanteric fracture. S/p left hip intramedullary nail on 09/09/23 as per ortho surg. Percocet, morphine  prn for pain.    Fall: at home. CT head shows no acute intracranial findings  Urinary retention: retaining approx 500. Foley placed b/c pt going for surg today. Voiding trial today   Pulmonary embolism: restarted home dose of eliquis     Chronic diastolic CHF: echo on 07/26/2023 showed EF of 50-55%. Appears compensated. Continue on metoprolol , aldactone , torsemide . Monitor I/Os   HTN: continue on home dose of torsemide , metoprolol , aldactone    CKDIIIa: Cr is trending down again today. Will continue to monitor    Hypothyroidism: continue on home dose of levothyroxine     DM2: HbA1c 6.9, well-controlled. Continue on SSI w/ accuchecks    Lumbar stenosis: w/ chronic lower back pain, but now has worsening back pain in the middle back  Chronic L1 compression fracture: percocet, morphine  prn for pain. PT/OT consulted   Obesity: BMI 38.1. Complicates overall care & prognosis         DVT prophylaxis: eliquis  Code Status: full  Family Communication:  Disposition Plan: depends on PT/OT recs   Level of care: Telemetry Medical  Status is: Inpatient Remains inpatient appropriate because: severity of  illness    Consultants:  Ortho surg   Procedures:   Antimicrobials:   Subjective: Pt c/o malaise  Objective: Vitals:   09/09/23 1630 09/09/23 2019 09/10/23 0359 09/10/23 0728  BP: 137/75 124/87 (!) 97/59 108/66  Pulse: 98 (!) 110 96 (!) 104  Resp: 16 18 18 16   Temp: 98.1 F (36.7 C) 97.7 F (36.5 C) 98.8 F (37.1 C) 98.3 F (36.8 C)  TempSrc: Oral Oral Oral Oral  SpO2: 99% 98% 95% 94%  Weight:      Height:        Intake/Output Summary (Last 24 hours) at 09/10/2023 0815 Last data filed at 09/10/2023 0420 Gross per 24 hour  Intake 2200 ml  Output 850 ml  Net 1350 ml   Filed Weights   09/08/23 2322  Weight: 88.5 kg    Examination:  General exam: Appear comfortable  Respiratory system: decreased breath sounds b/l  Cardiovascular system: S1/S2+. No gallops or rubs  Gastrointestinal system: Abd is soft, NT, obese & hypoactive bowel sounds Central nervous system: alert & oriented. Moves all extremities  Psychiatry: Judgement and insight appears at baseline. Flat mood and affect     Data Reviewed: I have personally reviewed following labs and imaging studies  CBC: Recent Labs  Lab 09/08/23 2329 09/09/23 0241 09/10/23 0338  WBC 8.6 13.9* 12.3*  NEUTROABS 4.8  --   --   HGB 12.0 12.3 10.2*  HCT 38.8 39.7 31.9*  MCV 82.7 82.7 82.9  PLT 300 294 252   Basic Metabolic Panel: Recent Labs  Lab 09/08/23  2329 09/09/23 0241 09/10/23 0338  NA 137 138 134*  K 3.5 3.7 4.0  CL 93* 94* 94*  CO2 29 29 30   GLUCOSE 148* 171* 174*  BUN 33* 31* 31*  CREATININE 1.66* 1.59* 1.54*  CALCIUM 9.2 9.5 8.7*   GFR: Estimated Creatinine Clearance: 28.8 mL/min (A) (by C-G formula based on SCr of 1.54 mg/dL (H)). Liver Function Tests: No results for input(s): AST, ALT, ALKPHOS, BILITOT, PROT, ALBUMIN in the last 168 hours. No results for input(s): LIPASE, AMYLASE in the last 168 hours. No results for input(s): AMMONIA in the last 168 hours. Coagulation  Profile: Recent Labs  Lab 09/08/23 2329  INR 1.4*   Cardiac Enzymes: No results for input(s): CKTOTAL, CKMB, CKMBINDEX, TROPONINI in the last 168 hours. BNP (last 3 results) Recent Labs    11/29/22 1028 12/16/22 1130 05/12/23 1407  PROBNP 106.0* 31.0 33.0   HbA1C: No results for input(s): HGBA1C in the last 72 hours. CBG: Recent Labs  Lab 09/09/23 1155 09/09/23 1529 09/09/23 1639 09/09/23 2202 09/10/23 0723  GLUCAP 193* 167* 157* 185* 140*   Lipid Profile: No results for input(s): CHOL, HDL, LDLCALC, TRIG, CHOLHDL, LDLDIRECT in the last 72 hours. Thyroid  Function Tests: No results for input(s): TSH, T4TOTAL, FREET4, T3FREE, THYROIDAB in the last 72 hours. Anemia Panel: No results for input(s): VITAMINB12, FOLATE, FERRITIN, TIBC, IRON , RETICCTPCT in the last 72 hours. Sepsis Labs: No results for input(s): PROCALCITON, LATICACIDVEN in the last 168 hours.  Recent Results (from the past 240 hours)  Surgical pcr screen     Status: None   Collection Time: 09/09/23  9:00 AM   Specimen: Nasal Mucosa; Nasal Swab  Result Value Ref Range Status   MRSA, PCR NEGATIVE NEGATIVE Final   Staphylococcus aureus NEGATIVE NEGATIVE Final    Comment: (NOTE) The Xpert SA Assay (FDA approved for NASAL specimens in patients 69 years of age and older), is one component of a comprehensive surveillance program. It is not intended to diagnose infection nor to guide or monitor treatment. Performed at [facility], [address] [place], [place] [address]          Radiology Studies: DG HIP UNILAT WITH PELVIS 2-3 VIEWS LEFT Result Date: 09/09/2023 CLINICAL DATA:  Left hip intramedullary nail. Intraoperative fluoroscopy. EXAM: DG HIP (WITH OR WITHOUT PELVIS) 2-3V LEFT COMPARISON:  Left hip radiographs 09/08/2023 FINDINGS: Images were performed intraoperatively without the presence of a radiologist. Interval left cephalomedullary  nail fixation of the previously seen displaced intertrochanteric fracture. No hardware complication is seen. Total fluoroscopy images: 6 Total fluoroscopy time: 143 seconds. Total dose: Radiation Exposure Index (as provided by the fluoroscopic device): 52.36 mGy air Kerma Please see intraoperative findings for further detail. IMPRESSION: Intraoperative fluoroscopy for left hip cephalomedullary nail fixation. Electronically Signed   By: Bertina Broccoli M.D.   On: 09/09/2023 16:37   DG C-Arm 1-60 Min-No Report Result Date: 09/09/2023 Fluoroscopy was utilized by the requesting physician.  No radiographic interpretation.   CT HEAD WO CONTRAST ( ) Result Date: 09/09/2023 CLINICAL DATA:  Fall with head trauma and back pain. EXAM: CT HEAD WITHOUT CONTRAST CT THORACIC AND LUMBAR SPINE WITHOUT CONTRAST TECHNIQUE: Multidetector CT imaging of the head was performed following the standard protocol from the skull base to vertex without intravenous contrast. Multiplanar CT image reconstructions were also generated. Multidetector CT imaging of the thoracic and lumbar spine was performed without contrast. Multiplanar CT image reconstructions were also generated. RADIATION DOSE REDUCTION: This exam was performed according  to the departmental dose-optimization program which includes automated exposure control, adjustment of the mA and/or kV according to patient size and/or use of iterative reconstruction technique. COMPARISON:  Head CT 05/06/2023, lumbar spine CT 10/21/2022, and CTA chest 10/04/2022. Also, PA and lateral chest 06/30/2023. FINDINGS: CT HEAD FINDINGS Brain: There is moderately advanced cerebral atrophy with atrophic ventriculomegaly and moderate to severe small vessel disease of the cerebral white matter. There are lacunar infarcts in the right caudate head and external capsules, chronic. Additional chronic tiny lacunar infarcts in the thalami. The cerebellum and brainstem are unremarkable. No cortical based  acute infarct, hemorrhage, mass or midline shift are seen. Basal cisterns are clear. Vascular: Patchy calcific plaques both siphons, both distal vertebral arteries. No hyperdense central vessels. Skull: Negative for fractures or focal lesions. Sinuses/orbits: Old lens replacements with otherwise negative orbits. Clear sinuses and mastoids. Other: None. CT THORACIC SPINE FINDINGS Alignment: Mild chronic thoracic spine kyphodextroscoliosis. Trace chronic anterolisthesis T6-7. No traumatic or further listhesis. Vertebrae: Osteopenia. Bridging osteophytes and multilevel interbody ankylosis related to bridging osteophytes again is seen from T4-5 through T10-11, with background advanced spondylosis. There is reactive sclerosis in the vertebral bodies T4, T6, T8 and T10, which was seen previously and unchanged. No primary pathologic lesion or destructive vertebral mass is seen. There is spinous process enthesopathy, with spinous process bridging T8-9 and T10-11. Paraspinal and other soft tissues: The lungs show posterior atelectasis. There is no pleural effusion or pneumothorax in the visualized chest. Small caliber central airways which could be due to tracheobronchomalacia, respiration or bronchospasm. The esophagus is patulous, mildly thickened but no more than previously. Small to moderate-sized hiatal hernia. The heart is moderately enlarged. There are coronary artery calcifications. Aortic atherosclerosis and uncoiling. Disc levels: The discs are collapsed in the thoracic spine at all levels from T2-3 through T11-12. At T6-7 there is a tiny central calcified disc extrusion but no resulting mass effect. There is minimal posterior endplate ridging at most levels but this does not significantly encroach on the thecal sac. No herniated discs or cord compromise are suspected. There are facet spurs in the lower third of the thoracic spine, at T10-11 causing moderate left foraminal stenosis, at T11-12 bilateral mild  foraminal stenosis. The other foramina appear clear. Other: None. CT LUMBAR SPINE FINDINGS Segmentation: There are 4 lumbar type vertebrae. The lumbosacral junction is defined L4-S1. Alignment: Mild lumbar levoscoliosis apex at L1. Chronic grade 1 anterolisthesis of L4-S1 is unchanged. Minimal grade 1 retrolisthesis L1-2 also chronic. Vertebrae: The prior study demonstrated an acute L1 compression fracture but with only mild loss of vertebral height. This has progressed with now up to 50% anterior and 30% posterior vertebral height loss with increased posterosuperior cortical retropulsion up to 4 mm lateralizing to the left. No acute fracture is seen. Again seen is L3-4 posterior fusion hardware with rods and pedicle screws and laminectomy defect, with interbody metal hardware. The fusion appears solid. There is definitely solid fusion over the facet joints. Generalized osteopenia. There is advanced spondylosis. No focal pathologic lesion is suspected. Reactive endplate sclerosis again noted T11-12 to the left. Paraspinal and other soft tissues: No paraspinal mass, hemorrhage or fluid collection. Heavy aortoiliac calcific plaque. Stable 2 cm right adrenal nodule, Hounsfield density is 30. Status post cholecystectomy. Low pelvic ureteral insertions consistent with pelvic floor laxity. Disc levels: The discs are diffusely degenerated. Retropulsion at T12-L1 mildly effaces the left ventral hemicanal. The foramina are clear. At L1-2, right paracentral disc osteophyte complex and mild facet  spurring encroaches on the right lateral recess without frank spinal stenosis. No foraminal compromise. L2-3 again demonstrating multifactorial severe spinal canal stenosis with facet hypertrophy and disc osteophyte complex. The foramina are moderately stenotic. Stable fusion changes L3-4 with laminectomy. L4-S1 advanced facet hypertrophy and mild to moderate multifactorial spinal canal stenosis. The foramina are clear. The facets are  ankylosed. IMPRESSION: 1. No acute intracranial CT findings or depressed skull fractures. 2. Chronic changes. 3. Osteopenia, scoliosis and degenerative change of the thoracic and lumbar spine without evidence of acute fractures. 4. 4 lumbar type vertebrae with the lumbosacral junction defined L4-S1. 5. Progressive L1 compression fracture with now up to 50% anterior and 30% posterior vertebral height loss and increased posterosuperior cortical retropulsion up to 4 mm lateralizing to the left. This now has a chronic appearance. 6. Stable surgical fusion change L3-4. 7. Multifactorial severe spinal canal stenosis at L2-3. 8. Mild to moderate multifactorial spinal canal stenosis at L4-S1. 9. Aortic and coronary artery atherosclerosis. 10. Stable 2 cm right adrenal nodule. 11. Hiatal hernia. 12. Patulous, mildly thickened esophagus. Aortic Atherosclerosis (ICD10-I70.0). Electronically Signed   By: Denman Fischer M.D.   On: 09/09/2023 03:36   CT LUMBAR SPINE WO CONTRAST Result Date: 09/09/2023 CLINICAL DATA:  Fall with head trauma and back pain. EXAM: CT HEAD WITHOUT CONTRAST CT THORACIC AND LUMBAR SPINE WITHOUT CONTRAST TECHNIQUE: Multidetector CT imaging of the head was performed following the standard protocol from the skull base to vertex without intravenous contrast. Multiplanar CT image reconstructions were also generated. Multidetector CT imaging of the thoracic and lumbar spine was performed without contrast. Multiplanar CT image reconstructions were also generated. RADIATION DOSE REDUCTION: This exam was performed according to the departmental dose-optimization program which includes automated exposure control, adjustment of the mA and/or kV according to patient size and/or use of iterative reconstruction technique. COMPARISON:  Head CT 05/06/2023, lumbar spine CT 10/21/2022, and CTA chest 10/04/2022. Also, PA and lateral chest 06/30/2023. FINDINGS: CT HEAD FINDINGS Brain: There is moderately advanced cerebral  atrophy with atrophic ventriculomegaly and moderate to severe small vessel disease of the cerebral white matter. There are lacunar infarcts in the right caudate head and external capsules, chronic. Additional chronic tiny lacunar infarcts in the thalami. The cerebellum and brainstem are unremarkable. No cortical based acute infarct, hemorrhage, mass or midline shift are seen. Basal cisterns are clear. Vascular: Patchy calcific plaques both siphons, both distal vertebral arteries. No hyperdense central vessels. Skull: Negative for fractures or focal lesions. Sinuses/orbits: Old lens replacements with otherwise negative orbits. Clear sinuses and mastoids. Other: None. CT THORACIC SPINE FINDINGS Alignment: Mild chronic thoracic spine kyphodextroscoliosis. Trace chronic anterolisthesis T6-7. No traumatic or further listhesis. Vertebrae: Osteopenia. Bridging osteophytes and multilevel interbody ankylosis related to bridging osteophytes again is seen from T4-5 through T10-11, with background advanced spondylosis. There is reactive sclerosis in the vertebral bodies T4, T6, T8 and T10, which was seen previously and unchanged. No primary pathologic lesion or destructive vertebral mass is seen. There is spinous process enthesopathy, with spinous process bridging T8-9 and T10-11. Paraspinal and other soft tissues: The lungs show posterior atelectasis. There is no pleural effusion or pneumothorax in the visualized chest. Small caliber central airways which could be due to tracheobronchomalacia, respiration or bronchospasm. The esophagus is patulous, mildly thickened but no more than previously. Small to moderate-sized hiatal hernia. The heart is moderately enlarged. There are coronary artery calcifications. Aortic atherosclerosis and uncoiling. Disc levels: The discs are collapsed in the thoracic spine at all levels  from T2-3 through T11-12. At T6-7 there is a tiny central calcified disc extrusion but no resulting mass effect.  There is minimal posterior endplate ridging at most levels but this does not significantly encroach on the thecal sac. No herniated discs or cord compromise are suspected. There are facet spurs in the lower third of the thoracic spine, at T10-11 causing moderate left foraminal stenosis, at T11-12 bilateral mild foraminal stenosis. The other foramina appear clear. Other: None. CT LUMBAR SPINE FINDINGS Segmentation: There are 4 lumbar type vertebrae. The lumbosacral junction is defined L4-S1. Alignment: Mild lumbar levoscoliosis apex at L1. Chronic grade 1 anterolisthesis of L4-S1 is unchanged. Minimal grade 1 retrolisthesis L1-2 also chronic. Vertebrae: The prior study demonstrated an acute L1 compression fracture but with only mild loss of vertebral height. This has progressed with now up to 50% anterior and 30% posterior vertebral height loss with increased posterosuperior cortical retropulsion up to 4 mm lateralizing to the left. No acute fracture is seen. Again seen is L3-4 posterior fusion hardware with rods and pedicle screws and laminectomy defect, with interbody metal hardware. The fusion appears solid. There is definitely solid fusion over the facet joints. Generalized osteopenia. There is advanced spondylosis. No focal pathologic lesion is suspected. Reactive endplate sclerosis again noted T11-12 to the left. Paraspinal and other soft tissues: No paraspinal mass, hemorrhage or fluid collection. Heavy aortoiliac calcific plaque. Stable 2 cm right adrenal nodule, Hounsfield density is 30. Status post cholecystectomy. Low pelvic ureteral insertions consistent with pelvic floor laxity. Disc levels: The discs are diffusely degenerated. Retropulsion at T12-L1 mildly effaces the left ventral hemicanal. The foramina are clear. At L1-2, right paracentral disc osteophyte complex and mild facet spurring encroaches on the right lateral recess without frank spinal stenosis. No foraminal compromise. L2-3 again  demonstrating multifactorial severe spinal canal stenosis with facet hypertrophy and disc osteophyte complex. The foramina are moderately stenotic. Stable fusion changes L3-4 with laminectomy. L4-S1 advanced facet hypertrophy and mild to moderate multifactorial spinal canal stenosis. The foramina are clear. The facets are ankylosed. IMPRESSION: 1. No acute intracranial CT findings or depressed skull fractures. 2. Chronic changes. 3. Osteopenia, scoliosis and degenerative change of the thoracic and lumbar spine without evidence of acute fractures. 4. 4 lumbar type vertebrae with the lumbosacral junction defined L4-S1. 5. Progressive L1 compression fracture with now up to 50% anterior and 30% posterior vertebral height loss and increased posterosuperior cortical retropulsion up to 4 mm lateralizing to the left. This now has a chronic appearance. 6. Stable surgical fusion change L3-4. 7. Multifactorial severe spinal canal stenosis at L2-3. 8. Mild to moderate multifactorial spinal canal stenosis at L4-S1. 9. Aortic and coronary artery atherosclerosis. 10. Stable 2 cm right adrenal nodule. 11. Hiatal hernia. 12. Patulous, mildly thickened esophagus. Aortic Atherosclerosis (ICD10-I70.0). Electronically Signed   By: Denman Fischer M.D.   On: 09/09/2023 03:36   CT THORACIC SPINE WO CONTRAST Result Date: 09/09/2023 CLINICAL DATA:  Fall with head trauma and back pain. EXAM: CT HEAD WITHOUT CONTRAST CT THORACIC AND LUMBAR SPINE WITHOUT CONTRAST TECHNIQUE: Multidetector CT imaging of the head was performed following the standard protocol from the skull base to vertex without intravenous contrast. Multiplanar CT image reconstructions were also generated. Multidetector CT imaging of the thoracic and lumbar spine was performed without contrast. Multiplanar CT image reconstructions were also generated. RADIATION DOSE REDUCTION: This exam was performed according to the departmental dose-optimization program which includes  automated exposure control, adjustment of the mA and/or kV according to patient  size and/or use of iterative reconstruction technique. COMPARISON:  Head CT 05/06/2023, lumbar spine CT 10/21/2022, and CTA chest 10/04/2022. Also, PA and lateral chest 06/30/2023. FINDINGS: CT HEAD FINDINGS Brain: There is moderately advanced cerebral atrophy with atrophic ventriculomegaly and moderate to severe small vessel disease of the cerebral white matter. There are lacunar infarcts in the right caudate head and external capsules, chronic. Additional chronic tiny lacunar infarcts in the thalami. The cerebellum and brainstem are unremarkable. No cortical based acute infarct, hemorrhage, mass or midline shift are seen. Basal cisterns are clear. Vascular: Patchy calcific plaques both siphons, both distal vertebral arteries. No hyperdense central vessels. Skull: Negative for fractures or focal lesions. Sinuses/orbits: Old lens replacements with otherwise negative orbits. Clear sinuses and mastoids. Other: None. CT THORACIC SPINE FINDINGS Alignment: Mild chronic thoracic spine kyphodextroscoliosis. Trace chronic anterolisthesis T6-7. No traumatic or further listhesis. Vertebrae: Osteopenia. Bridging osteophytes and multilevel interbody ankylosis related to bridging osteophytes again is seen from T4-5 through T10-11, with background advanced spondylosis. There is reactive sclerosis in the vertebral bodies T4, T6, T8 and T10, which was seen previously and unchanged. No primary pathologic lesion or destructive vertebral mass is seen. There is spinous process enthesopathy, with spinous process bridging T8-9 and T10-11. Paraspinal and other soft tissues: The lungs show posterior atelectasis. There is no pleural effusion or pneumothorax in the visualized chest. Small caliber central airways which could be due to tracheobronchomalacia, respiration or bronchospasm. The esophagus is patulous, mildly thickened but no more than previously. Small  to moderate-sized hiatal hernia. The heart is moderately enlarged. There are coronary artery calcifications. Aortic atherosclerosis and uncoiling. Disc levels: The discs are collapsed in the thoracic spine at all levels from T2-3 through T11-12. At T6-7 there is a tiny central calcified disc extrusion but no resulting mass effect. There is minimal posterior endplate ridging at most levels but this does not significantly encroach on the thecal sac. No herniated discs or cord compromise are suspected. There are facet spurs in the lower third of the thoracic spine, at T10-11 causing moderate left foraminal stenosis, at T11-12 bilateral mild foraminal stenosis. The other foramina appear clear. Other: None. CT LUMBAR SPINE FINDINGS Segmentation: There are 4 lumbar type vertebrae. The lumbosacral junction is defined L4-S1. Alignment: Mild lumbar levoscoliosis apex at L1. Chronic grade 1 anterolisthesis of L4-S1 is unchanged. Minimal grade 1 retrolisthesis L1-2 also chronic. Vertebrae: The prior study demonstrated an acute L1 compression fracture but with only mild loss of vertebral height. This has progressed with now up to 50% anterior and 30% posterior vertebral height loss with increased posterosuperior cortical retropulsion up to 4 mm lateralizing to the left. No acute fracture is seen. Again seen is L3-4 posterior fusion hardware with rods and pedicle screws and laminectomy defect, with interbody metal hardware. The fusion appears solid. There is definitely solid fusion over the facet joints. Generalized osteopenia. There is advanced spondylosis. No focal pathologic lesion is suspected. Reactive endplate sclerosis again noted T11-12 to the left. Paraspinal and other soft tissues: No paraspinal mass, hemorrhage or fluid collection. Heavy aortoiliac calcific plaque. Stable 2 cm right adrenal nodule, Hounsfield density is 30. Status post cholecystectomy. Low pelvic ureteral insertions consistent with pelvic floor  laxity. Disc levels: The discs are diffusely degenerated. Retropulsion at T12-L1 mildly effaces the left ventral hemicanal. The foramina are clear. At L1-2, right paracentral disc osteophyte complex and mild facet spurring encroaches on the right lateral recess without frank spinal stenosis. No foraminal compromise. L2-3 again demonstrating multifactorial severe  spinal canal stenosis with facet hypertrophy and disc osteophyte complex. The foramina are moderately stenotic. Stable fusion changes L3-4 with laminectomy. L4-S1 advanced facet hypertrophy and mild to moderate multifactorial spinal canal stenosis. The foramina are clear. The facets are ankylosed. IMPRESSION: 1. No acute intracranial CT findings or depressed skull fractures. 2. Chronic changes. 3. Osteopenia, scoliosis and degenerative change of the thoracic and lumbar spine without evidence of acute fractures. 4. 4 lumbar type vertebrae with the lumbosacral junction defined L4-S1. 5. Progressive L1 compression fracture with now up to 50% anterior and 30% posterior vertebral height loss and increased posterosuperior cortical retropulsion up to 4 mm lateralizing to the left. This now has a chronic appearance. 6. Stable surgical fusion change L3-4. 7. Multifactorial severe spinal canal stenosis at L2-3. 8. Mild to moderate multifactorial spinal canal stenosis at L4-S1. 9. Aortic and coronary artery atherosclerosis. 10. Stable 2 cm right adrenal nodule. 11. Hiatal hernia. 12. Patulous, mildly thickened esophagus. Aortic Atherosclerosis (ICD10-I70.0). Electronically Signed   By: Denman Fischer M.D.   On: 09/09/2023 03:36   DG FEMUR MIN 2 VIEWS LEFT Result Date: 09/09/2023 CLINICAL DATA:  [id number] Closed intertrochanteric fracture, left, initial encounter (HCC) [id number] EXAM: LEFT FEMUR 2 VIEWS COMPARISON:  None Available. FINDINGS: Acute displaced left intertrochanteric femoral fracture. No fracture of the femur more distally. No hip dislocation. No knee  dislocation. Severe tricompartmental degenerative changes of the knee. Vascular calcifications. IMPRESSION: 1. Acute displaced left intertrochanteric femoral fracture. 2. No fracture of the femur more distally. 3. Severe tricompartmental degenerative changes of the knee. Electronically Signed   By: Morgane  Naveau M.D.   On: 09/09/2023 00:58   DG HIP UNILAT WITH PELVIS 2-3 VIEWS LEFT Result Date: 09/08/2023 CLINICAL DATA:  Recent fall with left hip pain, initial encounter EXAM: DG HIP (WITH OR WITHOUT PELVIS) 3V LEFT COMPARISON:  None Available. FINDINGS: Pelvic ring is intact. Comminuted intratrochanteric left femoral fracture is noted with mild impaction and angulation at the fracture site. No soft tissue abnormality is seen. No other focal abnormality is noted. IMPRESSION: Comminuted left intratrochanteric fracture. Electronically Signed   By: Violeta Grey M.D.   On: 09/08/2023 23:59   DG Chest Port 1 View Result Date: 09/08/2023 CLINICAL DATA:  Recent fall with left hip pain, initial encounter EXAM: PORTABLE CHEST 1 VIEW COMPARISON:  06/30/2023 FINDINGS: Cardiac shadow is prominent but accentuated by the frontal technique. Aortic calcifications are seen. The lungs are clear but hypoinflated. No bony abnormality is noted. IMPRESSION: No active disease. Electronically Signed   By: Violeta Grey M.D.   On: 09/08/2023 23:59        Scheduled Meds:  acetaminophen   500 mg Oral Q6H   apixaban   5 mg Oral BID   Chlorhexidine Gluconate Cloth  6 each Topical Daily   docusate sodium   100 mg Oral BID   feeding supplement  237 mL Oral BID BM   ferrous sulfate   325 mg Oral Q breakfast   insulin  aspart  0-5 Units Subcutaneous QHS   insulin  aspart  0-9 Units Subcutaneous TID WC   levothyroxine   100 mcg Oral Q0600   lidocaine   1 patch Transdermal QHS   loratadine   10 mg Oral Daily   metoprolol  succinate  25 mg Oral Daily   multivitamin  1 tablet Oral QHS   pantoprazole   40 mg Oral Daily   scopolamine  1  patch Transdermal Q72H   spironolactone   25 mg Oral Daily   torsemide   40 mg Oral Daily  Continuous Infusions:  promethazine  (PHENERGAN ) injection (IM or IVPB) Stopped (09/09/23 0924)     LOS: 1 day       Alphonsus Jeans, MD [facility] Pager [phone]  If 7PM-7AM, please contact night-coverage [email or web address] 09/10/2023, 8:15 AM

## 2023-09-11 ENCOUNTER — Encounter: Payer: Self-pay | Admitting: Orthopedic Surgery

## 2023-09-11 ENCOUNTER — Ambulatory Visit (INDEPENDENT_AMBULATORY_CARE_PROVIDER_SITE_OTHER): Admitting: Nurse Practitioner

## 2023-09-11 DIAGNOSIS — S72002A Fracture of unspecified part of neck of left femur, initial encounter for closed fracture: Secondary | ICD-10-CM | POA: Diagnosis not present

## 2023-09-11 LAB — CBC
HCT: 30.1 % — ABNORMAL LOW (ref 36.0–46.0)
Hemoglobin: 9.4 g/dL — ABNORMAL LOW (ref 12.0–15.0)
MCH: 26.1 pg (ref 26.0–34.0)
MCHC: 31.2 g/dL (ref 30.0–36.0)
MCV: 83.6 fL (ref 80.0–100.0)
Platelets: 244 10*3/uL (ref 150–400)
RBC: 3.6 MIL/uL — ABNORMAL LOW (ref 3.87–5.11)
RDW: 20 % — ABNORMAL HIGH (ref 11.5–15.5)
WBC: 11 10*3/uL — ABNORMAL HIGH (ref 4.0–10.5)
nRBC: 0 % (ref 0.0–0.2)

## 2023-09-11 LAB — GLUCOSE, CAPILLARY
Glucose-Capillary: 183 mg/dL — ABNORMAL HIGH (ref 70–99)
Glucose-Capillary: 116 mg/dL — ABNORMAL HIGH (ref 70–99)
Glucose-Capillary: 127 mg/dL — ABNORMAL HIGH (ref 70–99)
Glucose-Capillary: 107 mg/dL — ABNORMAL HIGH (ref 70–99)
Glucose-Capillary: 165 mg/dL — ABNORMAL HIGH (ref 70–99)

## 2023-09-11 LAB — BASIC METABOLIC PANEL WITH GFR
Anion gap: 11 (ref 5–15)
BUN: 35 mg/dL — ABNORMAL HIGH (ref 8–23)
CO2: 30 mmol/L (ref 22–32)
Calcium: 9 mg/dL (ref 8.9–10.3)
Chloride: 92 mmol/L — ABNORMAL LOW (ref 98–111)
Creatinine, Ser: 1.53 mg/dL — ABNORMAL HIGH (ref 0.44–1.00)
GFR, Estimated: 34 mL/min — ABNORMAL LOW (ref >60)
Glucose, Bld: 118 mg/dL — ABNORMAL HIGH (ref 70–99)
Potassium: 3.7 mmol/L (ref 3.5–5.1)
Sodium: 133 mmol/L — ABNORMAL LOW (ref 135–145)

## 2023-09-11 MED ORDER — BOOST PLUS PO LIQD
237.0000 mL | Freq: Two times a day (BID) | ORAL | Status: DC
  Administered 2023-09-11 – 2023-09-14 (×6): 237 mL via ORAL

## 2023-09-11 NOTE — Progress Notes (Signed)
 Subjective: 2 Days Post-Op Procedure(s) (LRB): FIXATION, FRACTURE, INTERTROCHANTERIC, WITH INTRAMEDULLARY ROD (Left) Patient reports pain as mild.   Patient is well, and has had no acute complaints or problems Denies any CP, SOB, ABD pain. We will continue therapy today.  Plan is to go Skilled nursing facility after hospital stay.  Objective: Vital signs in last 24 hours: Temp:  [97.2 F (36.2 C)-98.5 F (36.9 C)] 98.1 F (36.7 C) (06/16 0744) Pulse Rate:  [53-120] 89 (06/16 0751) Resp:  [16-18] 18 (06/16 0744) BP: (118-143)/(61-80) 143/77 (06/16 0744) SpO2:  [81 %-95 %] 95 % (06/16 0751)  Intake/Output from previous day: 06/15 0701 - 06/16 0700 In: 240 [P.O.:240] Out: 1050 [Urine:1050] Intake/Output this shift: No intake/output data recorded.  Recent Labs    09/08/23 2329 09/09/23 0241 09/10/23 0338 09/11/23 0117  HGB 12.0 12.3 10.2* 9.4*   Recent Labs    09/10/23 0338 09/11/23 0117  WBC 12.3* 11.0*  RBC 3.85* 3.60*  HCT 31.9* 30.1*  PLT 252 244   Recent Labs    09/10/23 0338 09/11/23 0117  NA 134* 133*  K 4.0 3.7  CL 94* 92*  CO2 30 30  BUN 31* 35*  CREATININE 1.54* 1.53*  GLUCOSE 174* 118*  CALCIUM 8.7* 9.0   Recent Labs    09/08/23 2329  INR 1.4*    EXAM General - Patient is Alert, Appropriate, and Oriented Extremity - Neurovascular intact Sensation intact distally Intact pulses distally Dorsiflexion/Plantar flexion intact Dressing - dressing C/D/I and no drainage Motor Function - intact, moving foot and toes well on exam.   Past Medical History:  Diagnosis Date   Arthritis    Cancer (HCC)    ovarian, skin face- skin cancer   Complication of anesthesia    slow to awaken everday, even slower with anesthesia   Constipation    Diabetes mellitus    Type II   GERD (gastroesophageal reflux disease)    Headache 05/12/2015   after a fall headache for 8 months   Hemorrhoids    Hernia    History of kidney stones    passed    Hypertension    Hypothyroidism    Loss of appetite    Muscle spasm    back   Osteoporosis    Redness    abd wound   Urine frequency    unable to hold urine    Wears hearing aid    bilateral     Assessment/Plan:   2 Days Post-Op Procedure(s) (LRB): FIXATION, FRACTURE, INTERTROCHANTERIC, WITH INTRAMEDULLARY ROD (Left) Principal Problem:   Closed left hip fracture, initial encounter (HCC) Active Problems:   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   Weight as of this encounter: 88.5 kg. Advance diet Up with therapy Pain well controlled Labs and VSS CM to assist with discharge to SNF  Follow up with [facility] in 2 weeks   DVT Prophylaxis - TED hose and SCDs Eliquis  Weight-Bearing as tolerated to left leg   T. Larinda Plover  Trudi Fus [facility] 09/11/2023, 10:35 AM

## 2023-09-11 NOTE — Plan of Care (Signed)
  Problem: Education: Goal: Ability to describe self-care measures that may prevent or decrease complications (Diabetes Survival Skills Education) will improve Outcome: Progressing Goal: Individualized Educational Video(s) Outcome: Progressing   Problem: Coping: Goal: Ability to adjust to condition or change in health will improve Outcome: Progressing   Problem: Fluid Volume: Goal: Ability to maintain a balanced intake and output will improve Outcome: Progressing   Problem: Metabolic: Goal: Ability to maintain appropriate glucose levels will improve Outcome: Progressing   Problem: Nutritional: Goal: Maintenance of adequate nutrition will improve Outcome: Progressing Goal: Progress toward achieving an optimal weight will improve Outcome: Progressing   Problem: Skin Integrity: Goal: Risk for impaired skin integrity will decrease Outcome: Progressing   Problem: Health Behavior/Discharge Planning: Goal: Ability to manage health-related needs will improve Outcome: Progressing   Problem: Clinical Measurements: Goal: Ability to maintain clinical measurements within normal limits will improve Outcome: Progressing Goal: Will remain free from infection Outcome: Progressing Goal: Diagnostic test results will improve Outcome: Progressing Goal: Respiratory complications will improve Outcome: Progressing Goal: Cardiovascular complication will be avoided Outcome: Progressing   Problem: Activity: Goal: Risk for activity intolerance will decrease Outcome: Progressing   Problem: Nutrition: Goal: Adequate nutrition will be maintained Outcome: Progressing   Problem: Coping: Goal: Level of anxiety will decrease Outcome: Progressing

## 2023-09-11 NOTE — Progress Notes (Signed)
 Physical Therapy Treatment Patient Details Name: Stacy Kirk MRN: [id number] DOB: 1945/11/21 Today's Date: 09/11/2023   History of Present Illness presented to ER secondary to mechanical fall going to bathroom, acute onset of L hip, LBP; admitted for management of L1 compression fracture, comminuted L intertrochanteric hip fracture, s/p L IMN (09/09/23), WBAT.    PT Comments  Pt received upright in bed agreeable to PT. Reports no pain at rest. Had been pre-medicated prior to session. Pt continues to be vastly limited in functional mobility due to LLE pain with movement. Pt remains needing totalA+2 for all bed mobility.  PRN posterior bias sitting EOB due to pain in quad musculature needing close SBA and regular education and VC's for sitting posture to prevent falling with fair carryover to correct. X2 STS performed to RW needing minA+2 and bed elevated to clear buttocks. Pt with limited hip/knee extension both times with poor standing tolerance < 15 sec before needing seated rest. Unable to even attempt lateral steps to [facility] due to pain in standing. Pt repositioned back in bed with all needs in reach. D/c recs remain appropriate.    If plan is discharge home, recommend the following: Two people to help with walking and/or transfers;Two people to help with bathing/dressing/bathroom   Can travel by private vehicle     No  Equipment Recommendations       Recommendations for Other Services       Precautions / Restrictions Precautions Precautions: Fall Restrictions Weight Bearing Restrictions Per Provider Order: Yes LLE Weight Bearing Per Provider Order: Weight bearing as tolerated     Mobility  Bed Mobility Overal bed mobility: Needs Assistance Bed Mobility: Supine to Sit, Sit to Supine     Supine to sit: Total assist, +2 for physical assistance Sit to supine: Total assist, +2 for physical assistance     Patient Response: Cooperative, Anxious, Restless  Transfers Overall  transfer level: Needs assistance Equipment used: Rolling walker (2 wheels) Transfers: Sit to/from Stand Sit to Stand: From elevated surface, Min assist, +2 physical assistance           General transfer comment: requires bed elevated to assist in standing to RW. Poor hip clearance and inability to stand through extended hips/knees despite cuing and RW use. Continues to sit quickly with standing efforts due to LLE pain.    Ambulation/Gait               General Gait Details: unsafe/unable   Stairs             Wheelchair Mobility     Tilt Bed Tilt Bed Patient Response: Cooperative, Anxious, Restless  Modified Rankin (Stroke Patients Only)       Balance Overall balance assessment: Needs assistance Sitting-balance support: Feet supported, Bilateral upper extremity supported Sitting balance-Leahy Scale: Fair Sitting balance - Comments: PRN VC's to limit posterior leaning. Fait ability to respond to education on upright sitting at close SBA.   Standing balance support: Bilateral upper extremity supported, During functional activity, Reliant on assistive device for balance Standing balance-Leahy Scale: Poor Standing balance comment: heavy need for UE support with limited hip/knee extension.                            Communication Communication Communication: No apparent difficulties  Cognition   Behavior During Therapy: Restless, Anxious   PT - Cognitive impairments: No family/caregiver present to determine baseline  Following commands: Impaired Following commands impaired: Follows one step commands inconsistently    Cueing Cueing Techniques: Verbal cues, Gestural cues, Tactile cues  Exercises General Exercises - Lower Extremity Ankle Circles/Pumps: AROM, Strengthening, Left, 10 reps, Supine Quad Sets: AROM, Strengthening, Left, 10 reps, Supine Gluteal Sets: AROM, Strengthening, Supine, Left, 10 reps Long Arc  Quad: AROM, Strengthening, Left, 10 reps, Supine    General Comments        Pertinent Vitals/Pain Pain Assessment Pain Assessment: Faces Faces Pain Scale: Hurts whole lot Pain Location: L hip Pain Descriptors / Indicators: Grimacing, Crying, Discomfort Pain Intervention(s): Limited activity within patient's tolerance, Monitored during session, Repositioned    Home Living                          Prior Function            PT Goals (current goals can now be found in the care plan section) Acute Rehab PT Goals PT Goal Formulation: Patient unable to participate in goal setting Time For Goal Achievement: 09/24/23 Potential to Achieve Goals: Fair Progress towards PT goals: Progressing toward goals    Frequency    7X/week      PT Plan      Co-evaluation              AM-PAC PT 6 Clicks Mobility   Outcome Measure  Help needed turning from your back to your side while in a flat bed without using bedrails?: Total Help needed moving from lying on your back to sitting on the side of a flat bed without using bedrails?: Total Help needed moving to and from a bed to a chair (including a wheelchair)?: Total Help needed standing up from a chair using your arms (e.g., wheelchair or bedside chair)?: A Lot Help needed to walk in hospital room?: Total Help needed climbing 3-5 steps with a railing? : Total 6 Click Score: 7    End of Session Equipment Utilized During Treatment: Gait belt Activity Tolerance: Patient tolerated treatment well Patient left: with call bell/phone within reach;in bed;with bed alarm set Nurse Communication: Mobility status PT Visit Diagnosis: Muscle weakness (generalized) (M62.81);Difficulty in walking, not elsewhere classified (R26.2);Pain Pain - Right/Left: Left Pain - part of body: Hip     Time: 8469-6295 PT Time Calculation (min) (ACUTE ONLY): 23 min  Charges:    $Therapeutic Exercise: 8-22 mins $Therapeutic Activity: 8-22  mins PT General Charges $$ ACUTE PT VISIT: 1 Visit                     Marc Senior. Fairly IV, PT, DPT Physical Therapist- [place]  [facility] 09/11/2023, 11:09 AM

## 2023-09-11 NOTE — Progress Notes (Signed)
 PROGRESS NOTE    Stacy Kirk  ZOX:[id number] DOB: Oct 25, 1945 DOA: 09/08/2023 PCP: Dorothe Gaster, NP    Assessment & Plan:   Principal Problem:   Closed left hip fracture, initial encounter [facility]) Active Problems:   Fall at home, initial encounter   Pulmonary embolism (HCC)   Chronic diastolic CHF (congestive heart failure) (HCC)   Hypertension   Chronic kidney disease, stage 3a (HCC)   Hypothyroidism   Type II diabetes mellitus with renal manifestations (HCC)   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-L5 level   Obesity (BMI 30-39.9)   Closed comminuted intertrochanteric fracture of proximal end of left femur (HCC)  Assessment and Plan:  Closed left hip fracture : Comminuted left intratrochanteric fracture. S/p left hip intramedullary nail on 09/09/23 as per ortho surg. Percocet, morphine  prn for pain. PT recs SNF.    Fall: at home. CT head shows no acute intracranial findings  Urinary retention: retaining approx 500. Foley d/c on 09/10/23. Resolved   Pulmonary embolism: continue on home dose of eliquis     Chronic diastolic CHF: echo on 07/26/2023 showed EF of 50-55%. Appears compensated. Continue on torsemide , metoprolol , aldactone . Monitor I/Os    HTN: continue on home dose of aldactone , torsemide , metoprolol    CKDIIIa: Cr is trending down today. Avoid nephrotoxic meds    Hypothyroidism: continue on home dose of levothyroxine      DM2: HbA1c 6.9, well-controlled. Continue on SSI w/ accuchecks    Lumbar stenosis: w/ chronic lower back pain, but now has worsening back pain in the middle back  Chronic L1 compression fracture: percocet, morphine  prn for pain. PT recs SNF. Pt wants to think over SNF    Obesity: BMI 38.1. Complicates overall care & prognosis         DVT prophylaxis: eliquis  Code Status: full  Family Communication:  Disposition Plan: possibly d/c to SNF but pt wants to think over SNF.  Level of care: Telemetry Medical  Status is: Inpatient Remains inpatient  appropriate because: severity of illness    Consultants:  Ortho surg   Procedures:   Antimicrobials:   Subjective: Pt c/o fatigue   Objective: Vitals:   09/10/23 2004 09/11/23 0431 09/11/23 0744 09/11/23 0751  BP: 118/61 119/75 (!) 143/77   Pulse: 94 (!) 53 (!) 120 89  Resp:  18 18   Temp: 98.5 F (36.9 C) (!) 97.2 F (36.2 C) 98.1 F (36.7 C)   TempSrc: Oral  Oral   SpO2: 90% 93% (!) 81% 95%  Weight:      Height:        Intake/Output Summary (Last 24 hours) at 09/11/2023 0828 Last data filed at 09/11/2023 0500 Gross per 24 hour  Intake 240 ml  Output 700 ml  Net -460 ml   Filed Weights   09/08/23 2322  Weight: 88.5 kg    Examination:  General exam: appears calm & comfortable   Respiratory system: diminished breath sounds b/l  Cardiovascular system: S1 & S2+. No rubs or clicks  Gastrointestinal system: abd is soft, NT, obese & hypoactive bowel sounds  Central nervous system: alert & awake. Moves all extremities  Psychiatry: Judgement and insight appears at baseline. Flat mood and affect    Data Reviewed: I have personally reviewed following labs and imaging studies  CBC: Recent Labs  Lab 09/08/23 2329 09/09/23 0241 09/10/23 0338 09/11/23 0117  WBC 8.6 13.9* 12.3* 11.0*  NEUTROABS 4.8  --   --   --   HGB 12.0 12.3 10.2* 9.4*  HCT 38.8 39.7 31.9* 30.1*  MCV 82.7 82.7 82.9 83.6  PLT 300 294 252 244   Basic Metabolic Panel: Recent Labs  Lab 09/08/23 2329 09/09/23 0241 09/10/23 0338 09/11/23 0117  NA 137 138 134* 133*  K 3.5 3.7 4.0 3.7  CL 93* 94* 94* 92*  CO2 29 29 30 30   GLUCOSE 148* 171* 174* 118*  BUN 33* 31* 31* 35*  CREATININE 1.66* 1.59* 1.54* 1.53*  CALCIUM 9.2 9.5 8.7* 9.0   GFR: Estimated Creatinine Clearance: 29 mL/min (A) (by C-G formula based on SCr of 1.53 mg/dL (H)). Liver Function Tests: No results for input(s): AST, ALT, ALKPHOS, BILITOT, PROT, ALBUMIN in the last 168 hours. No results for input(s):  LIPASE, AMYLASE in the last 168 hours. No results for input(s): AMMONIA in the last 168 hours. Coagulation Profile: Recent Labs  Lab 09/08/23 2329  INR 1.4*   Cardiac Enzymes: No results for input(s): CKTOTAL, CKMB, CKMBINDEX, TROPONINI in the last 168 hours. BNP (last 3 results) Recent Labs    11/29/22 1028 12/16/22 1130 05/12/23 1407  PROBNP 106.0* 31.0 33.0   HbA1C: No results for input(s): HGBA1C in the last 72 hours. CBG: Recent Labs  Lab 09/10/23 0723 09/10/23 1248 09/10/23 1629 09/10/23 2205 09/11/23 0757  GLUCAP 140* 129* 119* 118* 116*   Lipid Profile: No results for input(s): CHOL, HDL, LDLCALC, TRIG, CHOLHDL, LDLDIRECT in the last 72 hours. Thyroid  Function Tests: No results for input(s): TSH, T4TOTAL, FREET4, T3FREE, THYROIDAB in the last 72 hours. Anemia Panel: No results for input(s): VITAMINB12, FOLATE, FERRITIN, TIBC, IRON , RETICCTPCT in the last 72 hours. Sepsis Labs: No results for input(s): PROCALCITON, LATICACIDVEN in the last 168 hours.  Recent Results (from the past 240 hours)  Surgical pcr screen     Status: None   Collection Time: 09/09/23  9:00 AM   Specimen: Nasal Mucosa; Nasal Swab  Result Value Ref Range Status   MRSA, PCR NEGATIVE NEGATIVE Final   Staphylococcus aureus NEGATIVE NEGATIVE Final    Comment: (NOTE) The Xpert SA Assay (FDA approved for NASAL specimens in patients 65 years of age and older), is one component of a comprehensive surveillance program. It is not intended to diagnose infection nor to guide or monitor treatment. Performed at [facility], [address] [place], [place] [address]          Radiology Studies: DG HIP UNILAT WITH PELVIS 2-3 VIEWS LEFT Result Date: 09/09/2023 CLINICAL DATA:  Left hip intramedullary nail. Intraoperative fluoroscopy. EXAM: DG HIP (WITH OR WITHOUT PELVIS) 2-3V LEFT COMPARISON:  Left hip radiographs 09/08/2023  FINDINGS: Images were performed intraoperatively without the presence of a radiologist. Interval left cephalomedullary nail fixation of the previously seen displaced intertrochanteric fracture. No hardware complication is seen. Total fluoroscopy images: 6 Total fluoroscopy time: 143 seconds. Total dose: Radiation Exposure Index (as provided by the fluoroscopic device): 52.36 mGy air Kerma Please see intraoperative findings for further detail. IMPRESSION: Intraoperative fluoroscopy for left hip cephalomedullary nail fixation. Electronically Signed   By: Bertina Broccoli M.D.   On: 09/09/2023 16:37   DG C-Arm 1-60 Min-No Report Result Date: 09/09/2023 Fluoroscopy was utilized by the requesting physician.  No radiographic interpretation.        Scheduled Meds:  apixaban   5 mg Oral BID   docusate sodium   100 mg Oral BID   feeding supplement  237 mL Oral BID BM   ferrous sulfate   325 mg Oral Q breakfast   insulin  aspart  0-5 Units  Subcutaneous QHS   insulin  aspart  0-9 Units Subcutaneous TID WC   levothyroxine   100 mcg Oral Q0600   lidocaine   1 patch Transdermal QHS   loratadine   10 mg Oral Daily   metoprolol  succinate  25 mg Oral Daily   multivitamin  1 tablet Oral QHS   pantoprazole   40 mg Oral Daily   scopolamine  1 patch Transdermal Q72H   spironolactone   25 mg Oral Daily   torsemide   40 mg Oral Daily   Continuous Infusions:  promethazine  (PHENERGAN ) injection (IM or IVPB) Stopped (09/09/23 0924)     LOS: 2 days       Alphonsus Jeans, MD [facility] Pager [phone]  If 7PM-7AM, please contact night-coverage [email or web address] 09/11/2023, 8:28 AM

## 2023-09-11 NOTE — Plan of Care (Signed)
  Problem: Education: Goal: Ability to describe self-care measures that may prevent or decrease complications (Diabetes Survival Skills Education) will improve Outcome: Progressing Goal: Individualized Educational Video(s) Outcome: Progressing   Problem: Coping: Goal: Ability to adjust to condition or change in health will improve Outcome: Progressing   Problem: Fluid Volume: Goal: Ability to maintain a balanced intake and output will improve Outcome: Progressing   Problem: Health Behavior/Discharge Planning: Goal: Ability to identify and utilize available resources and services will improve Outcome: Progressing Goal: Ability to manage health-related needs will improve Outcome: Progressing   Problem: Skin Integrity: Goal: Risk for impaired skin integrity will decrease Outcome: Progressing   Problem: Nutritional: Goal: Maintenance of adequate nutrition will improve Outcome: Progressing Goal: Progress toward achieving an optimal weight will improve Outcome: Progressing   Problem: Tissue Perfusion: Goal: Adequacy of tissue perfusion will improve Outcome: Progressing   Problem: Education: Goal: Knowledge of General Education information will improve Description: Including pain rating scale, medication(s)/side effects and non-pharmacologic comfort measures Outcome: Progressing   Problem: Clinical Measurements: Goal: Ability to maintain clinical measurements within normal limits will improve Outcome: Progressing Goal: Will remain free from infection Outcome: Progressing Goal: Diagnostic test results will improve Outcome: Progressing Goal: Respiratory complications will improve Outcome: Progressing Goal: Cardiovascular complication will be avoided Outcome: Progressing   Problem: Health Behavior/Discharge Planning: Goal: Ability to manage health-related needs will improve Outcome: Progressing   Problem: Nutrition: Goal: Adequate nutrition will be maintained Outcome:  Progressing   Problem: Activity: Goal: Risk for activity intolerance will decrease Outcome: Progressing   Problem: Coping: Goal: Level of anxiety will decrease Outcome: Progressing   Problem: Safety: Goal: Ability to remain free from injury will improve Outcome: Progressing   Problem: Pain Managment: Goal: General experience of comfort will improve and/or be controlled Outcome: Progressing   Problem: Skin Integrity: Goal: Risk for impaired skin integrity will decrease Outcome: Progressing   Problem: Education: Goal: Verbalization of understanding the information provided (i.e., activity precautions, restrictions, etc) will improve Outcome: Progressing Goal: Individualized Educational Video(s) Outcome: Progressing   Problem: Self-Concept: Goal: Ability to maintain and perform role responsibilities to the fullest extent possible will improve Outcome: Progressing

## 2023-09-11 NOTE — Progress Notes (Signed)
 Nutrition Follow-up  DOCUMENTATION CODES:   Obesity unspecified  INTERVENTION:   -D/c Ensure Plus High Protein po BID, each supplement provides 350 kcal and 20 grams of protein  -Boost Plus BID, each supplement provides 360 kcals and 16 grams protein -Continue regular diet  NUTRITION DIAGNOSIS:   Increased nutrient needs related to hip fracture as evidenced by estimated needs.  Ongoing  GOAL:   Patient will meet greater than or equal to 90% of their needs  Progressing   MONITOR:   PO intake, Diet advancement, Supplement acceptance, Labs, Weight trends  REASON FOR ASSESSMENT:   Consult Hip fracture protocol  ASSESSMENT:   78 year old female who presented to the ED on 6/13 after a fall. PMH of CHF, PE on Eliquis , HTN, T2DM, hypothyroidism, GI bleed, CKD stage IIIa, lymphedema, ovarian cancer, lumbar spinal stenosis. Pt admitted with left hip fracture.  6/14- s/p Left Hip Intramedullary nail   Reviewed I/O's: -810 ml x 24 hours and +540 ml since admission  UOP: 1.1 L x 24 hours  Spoke with pt at bedside, who reports feeling better today. Pt with some confusion and needed to be redirected. She reports poor appetite PTA, but unable to provide further diet history. She reports she ate a hamburger for breakfast.   Pt reports concern about weight loss, but unsure how much or her UBW. Reviewed wt hx; pt has experienced a 7.1% wt loss over the past 3 months, which is significant for time frame.   Discussed importance of good meal and supplement intake to promote healing. Pt reports she drinks chocolate Boost and prefers this supplement.   Medications reviewed and include colace, ferrous sulfate , claritin , protonix , aldactone , demadex , and phenergan .   Lab Results  Component Value Date   HGBA1C 6.9 (A) 03/03/2023   PTA DM medications are 500 mg metformin  daily.   Labs reviewed: Na: 133, CBGS: 116-140 (inpatient orders for glycemic control are 0-9 units insulin  aspart TID  with meals and 0-5 units insulin  aspart daily at bedtime).    NUTRITION - FOCUSED PHYSICAL EXAM:  Flowsheet Row Most Recent Value  Orbital Region No depletion  Upper Arm Region Mild depletion  Thoracic and Lumbar Region No depletion  Buccal Region No depletion  Temple Region No depletion  Clavicle Bone Region No depletion  Clavicle and Acromion Bone Region No depletion  Scapular Bone Region No depletion  Dorsal Hand Mild depletion  Patellar Region No depletion  Anterior Thigh Region No depletion  Posterior Calf Region No depletion  Edema (RD Assessment) Mild  Hair Reviewed  Eyes Reviewed  Mouth Reviewed  Skin Reviewed  Nails Reviewed    Diet Order:   Diet Order             Diet regular Room service appropriate? Yes; Fluid consistency: Thin  Diet effective now                   EDUCATION NEEDS:   Not appropriate for education at this time  Skin:  Skin Assessment: Skin Integrity Issues: Skin Integrity Issues:: Incisions Incisions: closed lt hip  Last BM:  09/10/23  Height:   Ht Readings from Last 1 Encounters:  09/08/23 5' (1.524 m)    Weight:   Wt Readings from Last 1 Encounters:  09/08/23 88.5 kg    Ideal Body Weight:  45.5 kg  BMI:  Body mass index is 38.12 kg/m.  Estimated Nutritional Needs:   Kcal:  1550-1750  Protein:  80-95 grams  Fluid:  1.6-1.8  [place], RD, LDN, CDCES Registered Dietitian III Certified Diabetes Care and Education Specialist If unable to reach this RD, please use RD Inpatient group chat on secure chat between hours of 8am-4 pm daily

## 2023-09-12 ENCOUNTER — Ambulatory Visit: Payer: Self-pay

## 2023-09-12 DIAGNOSIS — S72002A Fracture of unspecified part of neck of left femur, initial encounter for closed fracture: Secondary | ICD-10-CM | POA: Diagnosis not present

## 2023-09-12 LAB — BASIC METABOLIC PANEL WITH GFR
Anion gap: 16 — ABNORMAL HIGH (ref 5–15)
BUN: 42 mg/dL — ABNORMAL HIGH (ref 8–23)
CO2: 23 mmol/L (ref 22–32)
Calcium: 8.8 mg/dL — ABNORMAL LOW (ref 8.9–10.3)
Chloride: 94 mmol/L — ABNORMAL LOW (ref 98–111)
Creatinine, Ser: 1.69 mg/dL — ABNORMAL HIGH (ref 0.44–1.00)
GFR, Estimated: 30 mL/min — ABNORMAL LOW (ref >60)
Glucose, Bld: 80 mg/dL (ref 70–99)
Potassium: 5 mmol/L (ref 3.5–5.1)
Sodium: 133 mmol/L — ABNORMAL LOW (ref 135–145)

## 2023-09-12 LAB — CBC
HCT: 34.6 % — ABNORMAL LOW (ref 36.0–46.0)
Hemoglobin: 10.3 g/dL — ABNORMAL LOW (ref 12.0–15.0)
MCH: 26.5 pg (ref 26.0–34.0)
MCHC: 29.8 g/dL — ABNORMAL LOW (ref 30.0–36.0)
MCV: 88.9 fL (ref 80.0–100.0)
Platelets: 194 10*3/uL (ref 150–400)
RBC: 3.89 MIL/uL (ref 3.87–5.11)
RDW: 20.4 % — ABNORMAL HIGH (ref 11.5–15.5)
WBC: 9 10*3/uL (ref 4.0–10.5)
nRBC: 0.2 % (ref 0.0–0.2)

## 2023-09-12 LAB — GLUCOSE, CAPILLARY
Glucose-Capillary: 117 mg/dL — ABNORMAL HIGH (ref 70–99)
Glucose-Capillary: 163 mg/dL — ABNORMAL HIGH (ref 70–99)
Glucose-Capillary: 108 mg/dL — ABNORMAL HIGH (ref 70–99)
Glucose-Capillary: 139 mg/dL — ABNORMAL HIGH (ref 70–99)

## 2023-09-12 MED ORDER — HYDROCODONE-ACETAMINOPHEN 5-325 MG PO TABS: 1.0000 | ORAL_TABLET | Freq: Four times a day (QID) | ORAL | 0 refills | Status: DC | PRN

## 2023-09-12 NOTE — Care Management Important Message (Signed)
 Important Message  Patient Details  Name: Stacy Kirk MRN: [id number] Date of Birth: 1945/12/30   Important Message Given:  Yes - Medicare IM     Jomaira Darr W, CMA 09/12/2023, 12:20 PM

## 2023-09-12 NOTE — Progress Notes (Signed)
 Subjective: 3 Days Post-Op Procedure(s) (LRB): FIXATION, FRACTURE, INTERTROCHANTERIC, WITH INTRAMEDULLARY ROD (Left) Patient reports pain as mild.   Patient is well, and has had no acute complaints or problems Denies any CP, SOB, ABD pain. We will continue therapy today.  Plan is to go Skilled nursing facility after hospital stay.  Objective: Vital signs in last 24 hours: Temp:  [98.1 F (36.7 C)-99.8 F (37.7 C)] 98.6 F (37 C) (06/17 0756) Pulse Rate:  [90-110] 110 (06/17 0756) Resp:  [14-20] 14 (06/17 0756) BP: (95-132)/(62-96) 131/96 (06/17 0756) SpO2:  [92 %-96 %] 92 % (06/17 0756)  Intake/Output from previous day: 06/16 0701 - 06/17 0700 In: -  Out: 500 [Urine:500] Intake/Output this shift: No intake/output data recorded.  Recent Labs    09/10/23 0338 09/11/23 0117 09/12/23 0341  HGB 10.2* 9.4* 10.3*   Recent Labs    09/11/23 0117 09/12/23 0341  WBC 11.0* 9.0  RBC 3.60* 3.89  HCT 30.1* 34.6*  PLT 244 194   Recent Labs    09/11/23 0117 09/12/23 0341  NA 133* 133*  K 3.7 5.0  CL 92* 94*  CO2 30 23  BUN 35* 42*  CREATININE 1.53* 1.69*  GLUCOSE 118* 80  CALCIUM 9.0 8.8*   No results for input(s): LABPT, INR in the last 72 hours.   EXAM General - Patient is Alert and Appropriate Extremity - Neurovascular intact Sensation intact distally Intact pulses distally Dorsiflexion/Plantar flexion intact Dressing - dressing C/D/I and no drainage Motor Function - intact, moving foot and toes well on exam.   Past Medical History:  Diagnosis Date   Arthritis    Cancer (HCC)    ovarian, skin face- skin cancer   Complication of anesthesia    slow to awaken everday, even slower with anesthesia   Constipation    Diabetes mellitus    Type II   GERD (gastroesophageal reflux disease)    Headache 05/12/2015   after a fall headache for 8 months   Hemorrhoids    Hernia    History of kidney stones    passed   Hypertension    Hypothyroidism     Loss of appetite    Muscle spasm    back   Osteoporosis    Redness    abd wound   Urine frequency    unable to hold urine    Wears hearing aid    bilateral     Assessment/Plan:   3 Days Post-Op Procedure(s) (LRB): FIXATION, FRACTURE, INTERTROCHANTERIC, WITH INTRAMEDULLARY ROD (Left) Principal Problem:   Closed left hip fracture, initial encounter (HCC) Active Problems:   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   Weight as of this encounter: 88.5 kg. Advance diet Up with therapy, sow progress with PT Pain well controlled Labs and VSS CM to assist with discharge to SNF  Follow up with [facility] in 2 weeks   DVT Prophylaxis - TED hose and SCDs Eliquis  Weight-Bearing as tolerated to left leg   T. Larinda Plover  Trudi Fus [facility] 09/12/2023, 8:20 AM

## 2023-09-12 NOTE — Telephone Encounter (Signed)
 Copied from CRM [id number]. Topic: Clinical - Medical Advice >> Sep 12, 2023 10:45 AM [employer]ene [employer] wrote: Reason for CRM: Patient's husband, Gwen Lek, is calling because his wife fell, broke her hip, and is currently at the hospital. He stated he requested from the hospital to give him a call with updates about her but he has not received any calls. He is very worried and stated that he visited her yesterday 09/12/23 and she was confused but he is unsure if it is because of medication she is on but he has a special needs child which he takes care of and can't go to the hospital to check on her. He would like to check if NP Tivis Forster can call the hospital and call him back with any information regarding his wife. Mr. Gwen Lek sounded very stressed and cried on the phone due to his concerns. Please call him back with any information available regarding his wife's condition.  She is at [facility] Address: [address] [place], [place], [place] [address] Phone: [phone]  Callback: [phone]

## 2023-09-12 NOTE — Telephone Encounter (Signed)
 I spoke with pts husband (DPR signed) he spoke with pt yesterday and asked for nurse or doctor to call him but Mr Sardinas has not gotten cb. I spoke with Autumn nurse at [facility] taking care of pt today in rm 140. Autumn said pt has had PT and is up in chair; pt ate her breakfast this morning and seems to be doing OK. Autumn said she was with another pt and she will call Mr Ege back today. I let Mr Meiner know what Autumn said about how pt was doing today and  that Autumn would call Mr Feagans back to discuss pt. Mr Croft appreciative. Sending FYI to Margarie Shay NP.

## 2023-09-12 NOTE — Progress Notes (Signed)
 Physical Therapy Treatment Patient Details Name: Stacy Kirk MRN: [id number] DOB: 11-Oct-1945 Today's Date: 09/12/2023   History of Present Illness presented to ER secondary to mechanical fall going to bathroom, acute onset of L hip, LBP; admitted for management of L1 compression fracture, comminuted L intertrochanteric hip fracture, s/p L IMN (09/09/23), WBAT.    PT Comments  Pt was long sitting in bed upon arrival. She is awake and alert but does have cognition concerns and poor overall insight concerns of current deficits. Discussed at length PT recommendation and continued need for skilled PT. Eventually pt in agreement that rehab at [facility] is only safe disposition. Pt does require increased time due to pain and anxiety. Pat needed max-total assist to safely achieve EOB sitting. Stood several times to RW however from elevated bed height. Poor tolerance to wt bearing. Unable to progress to taking steps away from EOB. She did tolerate stand pivot to recliner with author supporting under both axillas. Acute PT will continue to follow and progress per current POC.    If plan is discharge home, recommend the following: A lot of help with bathing/dressing/bathroom;A lot of help with walking and/or transfers;Assistance with cooking/housework;Direct supervision/assist for medications management;Direct supervision/assist for financial management;Assist for transportation;Supervision due to cognitive status;Help with stairs or ramp for entrance     Equipment Recommendations  Rolling walker (2 wheels);BSC/3in1       Precautions / Restrictions Precautions Precautions: Fall Recall of Precautions/Restrictions: Intact Restrictions Weight Bearing Restrictions Per Provider Order: Yes LLE Weight Bearing Per Provider Order: Weight bearing as tolerated     Mobility  Bed Mobility Overal bed mobility: Needs Assistance Bed Mobility: Supine to Sit, Sit to Supine  Supine to sit: Max assist, Total assist,  Used rails, HOB elevated  General bed mobility comments: Pt requires extensive assistance+ time+ encouargement to achieve EOB sitting. pt tends to yell out in pain/fear of pain throughout all movements    Transfers Overall transfer level: Needs assistance Equipment used: Rolling walker (2 wheels) Transfers: Sit to/from Stand Sit to Stand: From elevated surface, Mod assist  General transfer comment: Pt stood three total times. 2 x from EOB (elevated) to RW. unable to tolerate wt on LEs to advance to taking steps to recliner. Elected to perform stand pivot form elevated bed height to recliner with extensive vcing and encouragement for full participation.    Ambulation/Gait  General Gait Details: unable   Balance Overall balance assessment: Needs assistance Sitting-balance support: Feet supported, Bilateral upper extremity supported Sitting balance-Leahy Scale: Fair     Standing balance support: Bilateral upper extremity supported, During functional activity, Reliant on assistive device for balance Standing balance-Leahy Scale: Poor       Communication Communication Communication: No apparent difficulties  Cognition Arousal: Alert Behavior During Therapy: Anxious, WFL for tasks assessed/performed   PT - Cognitive impairments: No family/caregiver present to determine baseline    PT - Cognition Comments: Pt is alert and able to follow commands however has poor insight of her deficits and poor overall awarenes of her abilities. discussed at length her need for post acute inpatient rehab. Following commands: Impaired Following commands impaired: Follows one step commands with increased time    Cueing Cueing Techniques: Verbal cues, Gestural cues, Tactile cues     General Comments General comments (skin integrity, edema, etc.): Author encouaged increased ther ex while in bed and chair to promote increased strength and independnece      Pertinent Vitals/Pain Pain Assessment Pain  Assessment: PAINAD Breathing: occasional  labored breathing, short period of hyperventilation Negative Vocalization: repeated troubled calling out, loud moaning/groaning, crying Facial Expression: facial grimacing Body Language: rigid, fists clenched, knees up, pushing/pulling away, strikes out Consolability: distracted or reassured by voice/touch PAINAD Score: 8 Pain Location: L hip Pain Descriptors / Indicators: Grimacing, Crying, Discomfort Pain Intervention(s): Limited activity within patient's tolerance, Monitored during session, Repositioned, Patient requesting pain meds-RN notified     PT Goals (current goals can now be found in the care plan section) Acute Rehab PT Goals Patient Stated Goal: go home to be with my autisti son Progress towards PT goals: Progressing toward goals    Frequency    7X/week       AM-PAC PT 6 Clicks Mobility   Outcome Measure  Help needed turning from your back to your side while in a flat bed without using bedrails?: A Lot Help needed moving from lying on your back to sitting on the side of a flat bed without using bedrails?: Total Help needed moving to and from a bed to a chair (including a wheelchair)?: Total Help needed standing up from a chair using your arms (e.g., wheelchair or bedside chair)?: A Lot Help needed to walk in hospital room?: Total Help needed climbing 3-5 steps with a railing? : Total 6 Click Score: 8    End of Session   Activity Tolerance: Patient tolerated treatment well Patient left: in bed;with call bell/phone within reach;in chair;with chair alarm set;with nursing/sitter in room Nurse Communication: Mobility status PT Visit Diagnosis: Muscle weakness (generalized) (M62.81);Difficulty in walking, not elsewhere classified (R26.2);Pain Pain - Right/Left: Left Pain - part of body: Hip     Time: 8295-6213 PT Time Calculation (min) (ACUTE ONLY): 20 min  Charges:    $Therapeutic Activity: 8-22 mins PT General  Charges $$ ACUTE PT VISIT: 1 Visit                     Chester Costa PTA 09/12/23, 9:14 AM

## 2023-09-12 NOTE — NC FL2 (Signed)
   MEDICAID FL2 LEVEL OF CARE FORM     IDENTIFICATION  Patient Name: Stacy Kirk Birthdate: 1946/01/10 Sex: female Admission Date (Current Location): 09/08/2023  [facility] and [place] Number:  Producer, television/film/video and Address:  [facility], [address] [place], [place] [address]      Provider Number: [id number]  Attending Physician Name and Address:  Alphonsus Jeans, MD  Relative Name and Phone Number:  Sunya Humbarger (Husband) [phone]    Current Level of Care: Hospital Recommended Level of Care: Skilled Nursing Facility Prior Approval Number:    Date Approved/Denied:   PASRR Number: [id number]  Discharge Plan: SNF    Current Diagnoses: Patient Active Problem List   Diagnosis Date Noted   Closed left hip fracture, initial encounter (HCC) 09/09/2023   Fall at home, initial encounter 09/09/2023   Type II diabetes mellitus with renal manifestations (HCC) 09/09/2023   Chronic kidney disease, stage 3a (HCC) 09/09/2023   Chronic diastolic CHF (congestive heart failure) (HCC) 09/09/2023   Obesity (BMI 30-39.9) 09/09/2023   Closed left hip fracture (HCC) 09/09/2023   Closed comminuted intertrochanteric fracture of proximal end of left femur (HCC) 09/09/2023   Adventitious breath sounds 06/30/2023   Acute cough 06/30/2023   PND (post-nasal drip) 06/07/2023   Abnormal urinalysis 06/07/2023   Incomplete bladder emptying 05/30/2023   Hematoma 05/30/2023   Chills 05/30/2023   Epistaxis 05/12/2023   Visit for suture removal 05/12/2023   Lymphedema 04/14/2023   Acute non-recurrent pansinusitis 03/03/2023   Pulmonary embolism (HCC) 12/09/2022   Hospital discharge follow-up 11/29/2022   Bilateral lower extremity edema 11/29/2022   Left leg weakness 11/29/2022   Rectal bleed 11/23/2022   Lower GI bleed 11/22/2022   Chronic systolic heart failure (HCC) 11/22/2022   GI bleed 11/22/2022   BRBPR (bright red blood per rectum)  11/22/2022   Lumbar compression fracture (HCC) 10/02/2022   Closed compression fracture of L2 lumbar vertebra, initial encounter (HCC) 09/30/2022   Hypokalemia 09/30/2022   Hypertension 09/30/2022   Controlled type 2 diabetes mellitus with complication, without long-term current use of insulin  (HCC) 08/05/2022   Chronic constipation 08/05/2022   OAB (overactive bladder) 08/05/2022   Hypothyroidism 08/05/2022   Fatigue 08/05/2022   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-L5 level 09/07/2016   Abdominal pain, RLQ 01/24/2011   Abdominal right lower quadrant swelling 01/24/2011   Low back pain 01/24/2011   Knee pain, right 01/24/2011    Orientation RESPIRATION BLADDER Height & Weight     Self, Place  Normal Incontinent Weight: 88.5 kg Height:  5' (152.4 cm)  BEHAVIORAL SYMPTOMS/MOOD NEUROLOGICAL BOWEL NUTRITION STATUS        Diet  AMBULATORY STATUS COMMUNICATION OF NEEDS Skin   Extensive Assist Verbally                         Personal Care Assistance Level of Assistance  Total care       Total Care Assistance: Maximum assistance   Functional Limitations Info             SPECIAL CARE FACTORS FREQUENCY  OT (By licensed OT), PT (By licensed PT)     PT Frequency: 5 x wk OT Frequency: 5 x wk            Contractures Contractures Info: Not present    Additional Factors Info  Code Status Code Status Info: Full Code  Current Medications (09/12/2023):  This is the current hospital active medication list Current Facility-Administered Medications  Medication Dose Route Frequency Provider Last Rate Last Admin   acetaminophen  (TYLENOL ) tablet 325-650 mg  325-650 mg Oral Q6H PRN Aberman, Zachary, MD       acetaminophen  (TYLENOL ) tablet 650 mg  650 mg Oral Q6H PRN Niu, Xilin, MD       apixaban  (ELIQUIS ) tablet 5 mg  5 mg Oral BID Aberman, Zachary, MD   5 mg at 09/12/23 1610   docusate sodium  (COLACE) capsule 100 mg  100 mg Oral BID Aberman, Zachary, MD   100 mg at  09/11/23 2051   ferrous sulfate  tablet 325 mg  325 mg Oral Q breakfast Niu, Xilin, MD   325 mg at 09/12/23 0920   hydrALAZINE  (APRESOLINE ) injection 5 mg  5 mg Intravenous Q2H PRN Niu, Xilin, MD       HYDROcodone -acetaminophen  (NORCO) 7.5-325 MG per tablet 1-2 tablet  1-2 tablet Oral Q4H PRN Aberman, Zachary, MD   1 tablet at 09/12/23 0920   HYDROcodone -acetaminophen  (NORCO/VICODIN) 5-325 MG per tablet 1-2 tablet  1-2 tablet Oral Q4H PRN Aberman, Zachary, MD       insulin  aspart (novoLOG ) injection 0-5 Units  0-5 Units Subcutaneous QHS Niu, Xilin, MD       insulin  aspart (novoLOG ) injection 0-9 Units  0-9 Units Subcutaneous TID WC Niu, Xilin, MD   2 Units at 09/12/23 1254   lactose free nutrition (BOOST PLUS) liquid 237 mL  237 mL Oral BID BM Alphonsus Jeans, MD   237 mL at 09/12/23 1255   levothyroxine  (SYNTHROID ) tablet 100 mcg  100 mcg Oral Q0600 Niu, Xilin, MD   100 mcg at 09/12/23 9604   lidocaine  (LIDODERM ) 5 % 1 patch  1 patch Transdermal QHS Niu, Xilin, MD   1 patch at 09/11/23 2051   loratadine  (CLARITIN ) tablet 10 mg  10 mg Oral Daily Niu, Xilin, MD   10 mg at 09/12/23 0920   menthol -cetylpyridinium (CEPACOL) lozenge 3 mg  1 lozenge Oral PRN Aberman, Zachary, MD   3 mg at 09/10/23 1100   Or   phenol (CHLORASEPTIC) mouth spray 1 spray  1 spray Mouth/Throat PRN Aberman, Zachary, MD   1 spray at 09/10/23 1719   methocarbamol (ROBAXIN) tablet 500 mg  500 mg Oral Q8H PRN Niu, Xilin, MD   500 mg at 09/09/23 0315   metoCLOPramide  (REGLAN ) tablet 5-10 mg  5-10 mg Oral Q8H PRN Aberman, Zachary, MD       Or   metoCLOPramide  (REGLAN ) injection 5-10 mg  5-10 mg Intravenous Q8H PRN Aberman, Zachary, MD       metoprolol  succinate (TOPROL -XL) 24 hr tablet 25 mg  25 mg Oral Daily Niu, Xilin, MD   25 mg at 09/12/23 0920   morphine  (PF) 2 MG/ML injection 0.5-1 mg  0.5-1 mg Intravenous Q2H PRN Aberman, Zachary, MD       multivitamin (RENA-VIT) tablet 1 tablet  1 tablet Oral QHS Alphonsus Jeans, MD    1 tablet at 09/11/23 2050   ondansetron  (ZOFRAN ) injection 4 mg  4 mg Intravenous Q8H PRN Niu, Xilin, MD   4 mg at 09/10/23 1719   pantoprazole  (PROTONIX ) EC tablet 40 mg  40 mg Oral Daily Niu, Xilin, MD   40 mg at 09/12/23 0920   promethazine  (PHENERGAN ) 12.5 mg in sodium chloride  0.9 % 50 mL IVPB  12.5 mg Intravenous Q6H PRN Alphonsus Jeans, MD   Stopped at  09/09/23 0924   scopolamine (TRANSDERM-SCOP) 1 MG/3DAYS 1.5 mg  1 patch Transdermal Q72H Alphonsus Jeans, MD   1.5 mg at 09/09/23 1251   senna-docusate (Senokot-S) tablet 1 tablet  1 tablet Oral QHS PRN Niu, Xilin, MD       spironolactone  (ALDACTONE ) tablet 25 mg  25 mg Oral Daily Niu, Xilin, MD   25 mg at 09/12/23 0920   torsemide  (DEMADEX ) tablet 40 mg  40 mg Oral Daily Niu, Xilin, MD   40 mg at 09/12/23 9562     Discharge Medications: Please see discharge summary for a list of discharge medications.  Relevant Imaging Results:  Relevant Lab Results:   Additional Information SSN: [id number]  Zoe Hinds, RN

## 2023-09-12 NOTE — Plan of Care (Signed)
  Problem: Coping: Goal: Ability to adjust to condition or change in health will improve Outcome: Progressing   Problem: Fluid Volume: Goal: Ability to maintain a balanced intake and output will improve Outcome: Progressing   Problem: Health Behavior/Discharge Planning: Goal: Ability to identify and utilize available resources and services will improve Outcome: Progressing Goal: Ability to manage health-related needs will improve Outcome: Progressing   Problem: Nutritional: Goal: Maintenance of adequate nutrition will improve Outcome: Progressing Goal: Progress toward achieving an optimal weight will improve Outcome: Progressing   Problem: Skin Integrity: Goal: Risk for impaired skin integrity will decrease Outcome: Progressing   Problem: Tissue Perfusion: Goal: Adequacy of tissue perfusion will improve Outcome: Progressing   Problem: Education: Goal: Knowledge of General Education information will improve Description: Including pain rating scale, medication(s)/side effects and non-pharmacologic comfort measures Outcome: Progressing   Problem: Clinical Measurements: Goal: Will remain free from infection Outcome: Progressing Goal: Respiratory complications will improve Outcome: Progressing Goal: Cardiovascular complication will be avoided Outcome: Progressing   Problem: Nutrition: Goal: Adequate nutrition will be maintained Outcome: Progressing   Problem: Coping: Goal: Level of anxiety will decrease Outcome: Progressing   Problem: Elimination: Goal: Will not experience complications related to urinary retention Outcome: Progressing   Problem: Pain Managment: Goal: General experience of comfort will improve and/or be controlled Outcome: Progressing   Problem: Safety: Goal: Ability to remain free from injury will improve Outcome: Progressing   Problem: Skin Integrity: Goal: Risk for impaired skin integrity will decrease Outcome: Progressing

## 2023-09-12 NOTE — Progress Notes (Signed)
 PROGRESS NOTE   HPI was taken from Dr. Rosalea Collin: Stacy Kirk is a 78 y.o. female with medical history significant of dCHF, PE on Eliquis , HTN, DM, hypothyroidism, GI bleeding, CKD-3A, lymphedema, hard of hearing, ovarian cancer, lumbar spinal stenosis, who presents with fall and left hip pain.   Per pt and her husband (I called her husband by phone), she cannot have going to the bathroom, tripped for steps, fell, injured her left hip, causing pain in left hip.  The pain is constant, severe, sharp, nonradiating, aggravated by movement.  She also injured her back.  She stated that she has a chronic back pain which is much worse in the middle back.  No leg numbness or weakness.  Denies chest pain, cough, SOB.  No nausea, vomiting, diarrhea or abdominal pain.  No symptoms of UTI.  No fever or chills.  She took her last dose of Eliquis  in the morning (6/13).     Data reviewed independently and ED Course: pt was found to have WBC 8.6, renal function close to baseline, temperature normal, blood pressure 127/70, heart rate 48 --> 100, RR 10, oxygen saturation 91-94% on room air.  Chest x-ray negative.  X-ray of left hip/pelvis showed comminuted left intertrochanteric fracture.  Patient is admitted to telemetry bed as inpatient.  Dr. Clyda Dark of Ortho is consulted.    Stacy Kirk  ZOX:[id number] DOB: [birth year] DOA: 09/08/2023 PCP: Dorothe Gaster, NP    Assessment & Plan:   Principal Problem:   Closed left hip fracture, initial encounter [facility]) Active Problems:   Fall at home, initial encounter   Pulmonary embolism (HCC)   Chronic diastolic CHF (congestive heart failure) (HCC)   Hypertension   Chronic kidney disease, stage 3a (HCC)   Hypothyroidism   Type II diabetes mellitus with renal manifestations (HCC)   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-L5 level   Obesity (BMI 30-39.9)   Closed comminuted intertrochanteric fracture of proximal end of left femur (HCC)  Assessment and Plan:  Closed left hip  fracture : Comminuted left intratrochanteric fracture. S/p left hip intramedullary nail on 09/09/23 as per ortho surg. Percocet, morphine  prn for pain. PT/OT recs SNF. Messaged CM about starting SNF work up but no response yet   Fall: at home. CT head shows no acute intracranial findings  Urinary retention: retaining approx 500. Foley d/c on 09/10/23. Resolved   Pulmonary embolism: continue on home dose of eliquis     Chronic diastolic CHF: echo on 07/26/2023 showed EF of 50-55%.  Appears compensated. Continue on metoprolol , torsemide , aldactone . Monitor I/Os    HTN: continue on metoprolol , aldactone , torsemide    CKDIIIa: Cr is labile. Avoid nephrotoxic meds    Hypothyroidism: continue on home dose of levothyroxine      DM2: HbA1c 6.9, well-controlled. Continue on SSI w/ accuchecks    Lumbar stenosis: w/ chronic lower back pain, but now has worsening back pain in the middle back  Chronic L1 compression fracture: percocet, morphine  prn for pain. PT recs SNF. Pt is agreeable to SNF    Obesity: BMI 38.1. Complicates overall care & prognosis.         DVT prophylaxis: eliquis  Code Status: full  Family Communication:  Disposition Plan: possibly d/c to SNF. Messaged CM about starting SNF work up but no response yet  Level of care: Telemetry Medical  Status is: Inpatient Remains inpatient appropriate because: needs SNF placement     Consultants:  Ortho surg   Procedures:   Antimicrobials:   Subjective: Pt c/o  malaise   Objective: Vitals:   09/11/23 2042 09/11/23 2355 09/12/23 0510 09/12/23 0756  BP:  109/62 (!) 132/90 (!) 131/96  Pulse: 90 99 (!) 109 (!) 110  Resp:  17 18 14   Temp:  98.1 F (36.7 C) 99.8 F (37.7 C) 98.6 F (37 C)  TempSrc:  Oral Oral   SpO2: 94% 96% 96% 92%  Weight:      Height:        Intake/Output Summary (Last 24 hours) at 09/12/2023 1610 Last data filed at 09/11/2023 1836 Gross per 24 hour  Intake --  Output 500 ml  Net -500 ml   Filed  Weights   09/08/23 2322  Weight: 88.5 kg    Examination:  General exam: appears calm & comfortable   Respiratory system: decreased breath sounds b/l  Cardiovascular system: S1/S2+. No rubs or clicks  Gastrointestinal system: abd is soft, NT, obese & hypoactive bowel sounds  Central nervous system: alert & awake. Moves all extremities  Psychiatry: Judgement and insight appears at baseline. Flat mood and affect     Data Reviewed: I have personally reviewed following labs and imaging studies  CBC: Recent Labs  Lab 09/08/23 2329 09/09/23 0241 09/10/23 0338 09/11/23 0117 09/12/23 0341  WBC 8.6 13.9* 12.3* 11.0* 9.0  NEUTROABS 4.8  --   --   --   --   HGB 12.0 12.3 10.2* 9.4* 10.3*  HCT 38.8 39.7 31.9* 30.1* 34.6*  MCV 82.7 82.7 82.9 83.6 88.9  PLT 300 294 252 244 194   Basic Metabolic Panel: Recent Labs  Lab 09/08/23 2329 09/09/23 0241 09/10/23 0338 09/11/23 0117 09/12/23 0341  NA 137 138 134* 133* 133*  K 3.5 3.7 4.0 3.7 5.0  CL 93* 94* 94* 92* 94*  CO2 29 29 30 30 23   GLUCOSE 148* 171* 174* 118* 80  BUN 33* 31* 31* 35* 42*  CREATININE 1.66* 1.59* 1.54* 1.53* 1.69*  CALCIUM 9.2 9.5 8.7* 9.0 8.8*   GFR: Estimated Creatinine Clearance: 26.3 mL/min (A) (by C-G formula based on SCr of 1.69 mg/dL (H)). Liver Function Tests: No results for input(s): AST, ALT, ALKPHOS, BILITOT, PROT, ALBUMIN in the last 168 hours. No results for input(s): LIPASE, AMYLASE in the last 168 hours. No results for input(s): AMMONIA in the last 168 hours. Coagulation Profile: Recent Labs  Lab 09/08/23 2329  INR 1.4*   Cardiac Enzymes: No results for input(s): CKTOTAL, CKMB, CKMBINDEX, TROPONINI in the last 168 hours. BNP (last 3 results) Recent Labs    11/29/22 1028 12/16/22 1130 05/12/23 1407  PROBNP 106.0* 31.0 33.0   HbA1C: No results for input(s): HGBA1C in the last 72 hours. CBG: Recent Labs  Lab 09/11/23 0757 09/11/23 1209 09/11/23 1555  09/11/23 2035 09/12/23 0749  GLUCAP 116* 127* 107* 165* 117*   Lipid Profile: No results for input(s): CHOL, HDL, LDLCALC, TRIG, CHOLHDL, LDLDIRECT in the last 72 hours. Thyroid  Function Tests: No results for input(s): TSH, T4TOTAL, FREET4, T3FREE, THYROIDAB in the last 72 hours. Anemia Panel: No results for input(s): VITAMINB12, FOLATE, FERRITIN, TIBC, IRON , RETICCTPCT in the last 72 hours. Sepsis Labs: No results for input(s): PROCALCITON, LATICACIDVEN in the last 168 hours.  Recent Results (from the past 240 hours)  Surgical pcr screen     Status: None   Collection Time: 09/09/23  9:00 AM   Specimen: Nasal Mucosa; Nasal Swab  Result Value Ref Range Status   MRSA, PCR NEGATIVE NEGATIVE Final   Staphylococcus aureus NEGATIVE NEGATIVE Final  Comment: (NOTE) The Xpert SA Assay (FDA approved for NASAL specimens in patients 45 years of age and older), is one component of a comprehensive surveillance program. It is not intended to diagnose infection nor to guide or monitor treatment. Performed at [facility], [address], [place], [place] [address]          Radiology Studies: No results found.       Scheduled Meds:  apixaban   5 mg Oral BID   docusate sodium   100 mg Oral BID   ferrous sulfate   325 mg Oral Q breakfast   insulin  aspart  0-5 Units Subcutaneous QHS   insulin  aspart  0-9 Units Subcutaneous TID WC   lactose free nutrition  237 mL Oral BID BM   levothyroxine   100 mcg Oral Q0600   lidocaine   1 patch Transdermal QHS   loratadine   10 mg Oral Daily   metoprolol  succinate  25 mg Oral Daily   multivitamin  1 tablet Oral QHS   pantoprazole   40 mg Oral Daily   scopolamine  1 patch Transdermal Q72H   spironolactone   25 mg Oral Daily   torsemide   40 mg Oral Daily   Continuous Infusions:  promethazine  (PHENERGAN ) injection (IM or IVPB) Stopped (09/09/23 0924)     LOS: 3 days       Alphonsus Jeans,  MD [facility] Pager [phone]  If 7PM-7AM, please contact night-coverage [email or web address] 09/12/2023, 8:22 AM

## 2023-09-12 NOTE — TOC Initial Note (Signed)
 Transition of Care [facility]) - Initial/Assessment Note    Patient Details  Name: Stacy Kirk MRN: [id number] Date of Birth: 22-Jun-1945  Transition of [facility]) CM/SW Contact:    Zoe Hinds, RN Phone Number: 09/12/2023, 4:41 PM  Clinical Narrative:                 This CM introduced role  completed Initial assessment with pt's husband . Pt A&Ox 2 per bedside  RN . Mr. Arya shared pt lives in a single family ranch style home with hisself and their 14 y.o son that has special needs . Home has steps to enter, and ramp entry.  [facility] [place] & PCP is Guerry Leek, DNP, AGNP-C located at [facility] [address] [place], [place], [place] [address].Pt has access to DME at home that includes RW, & [employer] in One . Pt was receiving minimal assistance with ADL'S from Mr. Lilley prior to this admission he confirmed. Mr. Manganelli informed pt rec'd rehab last yr @ SNF [facility] and informed that is his 1st choice rehab bed. This CM completed FL2 and sent referral awaiting reply reply. Pt will need insurance auth started once bed offer accepted . TOC will cont to follow dc planning / care  coordination during hospital stay and update as applicable.   Expected Discharge Plan: Skilled Nursing Facility Barriers to Discharge: No Barriers Identified   Patient Goals and CMS Choice Patient states their goals for this hospitalization and ongoing recovery are:: Mr Remmert To transition SNF for rehab   Choice offered to / list presented to : Spouse      Expected Discharge Plan and Services   Discharge Planning Services: CM Consult Post Acute Care Choice: Skilled Nursing Facility Living arrangements for the past 2 months: Single Family Home                                      Prior Living Arrangements/Services Living arrangements for the past 2 months: Single Family Home Lives with:: Adult Children,  Spouse Patient language and need for interpreter reviewed:: No Do you feel safe going back to the place where you live?: Yes      Need for Family Participation in Patient Care: No (Comment) Care giver support system in place?: Yes (comment)      Activities of Daily Living   ADL Screening (condition at time of admission) Independently performs ADLs?: Yes (appropriate for developmental age) Is the patient deaf or have difficulty hearing?: Yes Does the patient have difficulty seeing, even when wearing glasses/contacts?: No Does the patient have difficulty concentrating, remembering, or making decisions?: No  Permission Sought/Granted Permission sought to share information with : Case Manager                Emotional Assessment     Affect (typically observed): Calm Orientation: : Fluctuating Orientation (Suspected and/or reported Sundowners) Alcohol / Substance Use: Not Applicable Psych Involvement: No (comment)  Admission diagnosis:  Closed left hip fracture (HCC) [S72.002A] Closed left hip fracture, initial encounter [facility]) [S72.002A] Patient Active Problem List   Diagnosis Date Noted   Closed left hip fracture, initial encounter (HCC) 09/09/2023   Fall at home, initial encounter 09/09/2023   Type II diabetes mellitus with renal manifestations (HCC) 09/09/2023   Chronic kidney disease, stage 3a (HCC) 09/09/2023  Chronic diastolic CHF (congestive heart failure) (HCC) 09/09/2023   Obesity (BMI 30-39.9) 09/09/2023   Closed left hip fracture (HCC) 09/09/2023   Closed comminuted intertrochanteric fracture of proximal end of left femur (HCC) 09/09/2023   Adventitious breath sounds 06/30/2023   Acute cough 06/30/2023   PND (post-nasal drip) 06/07/2023   Abnormal urinalysis 06/07/2023   Incomplete bladder emptying 05/30/2023   Hematoma 05/30/2023   Chills 05/30/2023   Epistaxis 05/12/2023   Visit for suture removal 05/12/2023   Lymphedema 04/14/2023   Acute non-recurrent  pansinusitis 03/03/2023   Pulmonary embolism (HCC) 12/09/2022   Hospital discharge follow-up 11/29/2022   Bilateral lower extremity edema 11/29/2022   Left leg weakness 11/29/2022   Rectal bleed 11/23/2022   Lower GI bleed 11/22/2022   Chronic systolic heart failure (HCC) 11/22/2022   GI bleed 11/22/2022   BRBPR (bright red blood per rectum) 11/22/2022   Lumbar compression fracture (HCC) 10/02/2022   Closed compression fracture of L2 lumbar vertebra, initial encounter (HCC) 09/30/2022   Hypokalemia 09/30/2022   Hypertension 09/30/2022   Controlled type 2 diabetes mellitus with complication, without long-term current use of insulin  (HCC) 08/05/2022   Chronic constipation 08/05/2022   OAB (overactive bladder) 08/05/2022   Hypothyroidism 08/05/2022   Fatigue 08/05/2022   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-L5 level 09/07/2016   Abdominal pain, RLQ 01/24/2011   Abdominal right lower quadrant swelling 01/24/2011   Low back pain 01/24/2011   Knee pain, right 01/24/2011   PCP:  Dorothe Gaster, NP [facility]acy[phone] [address] [place] - [address] [place] [address] Phone: [phone] Fax: [phone]     Social Drivers of Health (SDOH) Social History: SDOH Screenings   Food Insecurity: No Food Insecurity (09/09/2023)  Housing: Low Risk  (09/09/2023)  Transportation Needs: No Transportation Needs (09/09/2023)  Utilities: Not At Risk (09/09/2023)  Social Connections: Unknown (09/09/2023)  Tobacco Use: Medium Risk (09/08/2023)   SDOH Interventions:     Readmission Risk Interventions     No data to display

## 2023-09-13 DIAGNOSIS — S72002A Fracture of unspecified part of neck of left femur, initial encounter for closed fracture: Secondary | ICD-10-CM | POA: Diagnosis not present

## 2023-09-13 LAB — BASIC METABOLIC PANEL WITH GFR
Anion gap: 14 (ref 5–15)
BUN: 55 mg/dL — ABNORMAL HIGH (ref 8–23)
CO2: 29 mmol/L (ref 22–32)
Calcium: 9.1 mg/dL (ref 8.9–10.3)
Chloride: 90 mmol/L — ABNORMAL LOW (ref 98–111)
Creatinine, Ser: 1.86 mg/dL — ABNORMAL HIGH (ref 0.44–1.00)
GFR, Estimated: 27 mL/min — ABNORMAL LOW (ref >60)
Glucose, Bld: 105 mg/dL — ABNORMAL HIGH (ref 70–99)
Potassium: 4.3 mmol/L (ref 3.5–5.1)
Sodium: 133 mmol/L — ABNORMAL LOW (ref 135–145)

## 2023-09-13 LAB — GLUCOSE, CAPILLARY
Glucose-Capillary: 112 mg/dL — ABNORMAL HIGH (ref 70–99)
Glucose-Capillary: 140 mg/dL — ABNORMAL HIGH (ref 70–99)
Glucose-Capillary: 152 mg/dL — ABNORMAL HIGH (ref 70–99)
Glucose-Capillary: 137 mg/dL — ABNORMAL HIGH (ref 70–99)

## 2023-09-13 LAB — CBC
HCT: 29.2 % — ABNORMAL LOW (ref 36.0–46.0)
Hemoglobin: 9 g/dL — ABNORMAL LOW (ref 12.0–15.0)
MCH: 26.1 pg (ref 26.0–34.0)
MCHC: 30.8 g/dL (ref 30.0–36.0)
MCV: 84.6 fL (ref 80.0–100.0)
Platelets: 233 10*3/uL (ref 150–400)
RBC: 3.45 MIL/uL — ABNORMAL LOW (ref 3.87–5.11)
RDW: 19.9 % — ABNORMAL HIGH (ref 11.5–15.5)
WBC: 8.1 10*3/uL (ref 4.0–10.5)
nRBC: 0.2 % (ref 0.0–0.2)

## 2023-09-13 MED ORDER — SODIUM CHLORIDE 0.9 % IV SOLN: INTRAVENOUS | Status: AC

## 2023-09-13 NOTE — Progress Notes (Signed)
 Physical Therapy Treatment Patient Details Name: Stacy Kirk MRN: [id number] DOB: 09/05/1945 Today's Date: 09/13/2023   History of Present Illness presented to ER secondary to mechanical fall going to bathroom, acute onset of L hip, LBP; admitted for management of L1 compression fracture, comminuted L intertrochanteric hip fracture, s/p L IMN (09/09/23), WBAT.    PT Comments  Pt supine in bed with HOB elevated upon entry, pt was pleasantly confused throughout treatment with inconsistently ability to maintain a conversation and or answer questions with efficacy. Pt was agreeable to session, but remained hesitant to participate due to reports of increased discomfort and pain. PT attempted to sit pt EOB 2x trials without success due to aforementioned symptomology and increased discomfort. PT focused the rest of the session on BLE strengthening in a very limited range to promote strengthening and increase blood flow to LE. Pt demonstrates increased fatigue with minimal exercises, and states not being able to get much sleep today. Cognition and pain continue to be limiting factors in progressing towards goals and functional mobility improvement. Nurse notified of treatment session and pt's request for pain medications. Pt would benefit from skilled PT to continue to work towards PT goals and return to PLOF.      If plan is discharge home, recommend the following: A lot of help with bathing/dressing/bathroom;A lot of help with walking and/or transfers;Assistance with cooking/housework;Direct supervision/assist for medications management;Direct supervision/assist for financial management;Assist for transportation;Supervision due to cognitive status;Help with stairs or ramp for entrance   Can travel by private vehicle     No  Equipment Recommendations  Rolling walker (2 wheels);BSC/3in1    Recommendations for Other Services       Precautions / Restrictions Precautions Precautions: Fall Recall  of Precautions/Restrictions: Intact Restrictions Weight Bearing Restrictions Per Provider Order: Yes LLE Weight Bearing Per Provider Order: Weight bearing as tolerated     Mobility  Bed Mobility Overal bed mobility: Needs Assistance Bed Mobility: Supine to Sit, Sit to Supine     Supine to sit: Max assist, Total assist, Used rails, HOB elevated (inability to complete due to increased reports of pain, halfway throught bed mobility)     General bed mobility comments: Due to increased reports of pain, moaning and grimacing, pt defered further mobility to subsequent session.    Transfers   General transfer comment: Due to increased reports of pain, moaning and grimacing, pt defered further mobility to subsequent session.    Ambulation/Gait   General Gait Details: Due to increased reports of pain, moaning and grimacing, pt defered further mobility to subsequent session.      Balance     Sitting balance - Comments: Due to increased reports of pain, moaning and grimacing, pt defered further balance assesment to subsequent session.   Standing balance comment: Due to increased reports of pain, moaning and grimacing, pt defered further balance assesment to subsequent session.     Communication Communication Communication: Impaired Factors Affecting Communication: Difficulty expressing self  Cognition Arousal: Alert Behavior During Therapy: Anxious, Flat affect, Agitated   PT - Cognitive impairments: No family/caregiver present to determine baseline     PT - Cognition Comments: Pt able to recall name and DOB, inconsistently answering questions and inability to maintain conversations Following commands: Impaired Following commands impaired: Follows one step commands inconsistently    Cueing Cueing Techniques: Verbal cues, Gestural cues, Tactile cues  Exercises General Exercises - Lower Extremity Ankle Circles/Pumps: AROM, Strengthening, Left, Supine, Other reps (comment) (3x10  reps) Straight Leg Raises:  AAROM, Strengthening, Both, Other reps (comment), Limitations (2x 3 reps, limited by pain) Straight Leg Raises Limitations: Increased P! B    General Comments General comments (skin integrity, edema, etc.): Pt is severely limited in refrence to baseline ability. Reports incease in pain and fatigue today, will little motivation to perform activities today. Dressing dry and free of exudate throughout session. Pre session HR:53 bpm, SpO2:94% on 2L O2, Post session HR:52 bpm, SpO2:96+% on 2L O2.      Pertinent Vitals/Pain Pain Assessment Pain Assessment: Faces Faces Pain Scale: Hurts little more Pain Location: L hip Pain Descriptors / Indicators: Grimacing, Discomfort, Moaning Pain Intervention(s): Limited activity within patient's tolerance, Monitored during session, Repositioned, Patient requesting pain meds-RN notified     PT Goals (current goals can now be found in the care plan section) Acute Rehab PT Goals Patient Stated Goal: go home to be with my autistic son PT Goal Formulation: Patient unable to participate in goal setting Time For Goal Achievement: 09/24/23 Potential to Achieve Goals: Fair Progress towards PT goals: Progressing toward goals    Frequency    7X/week       AM-PAC PT 6 Clicks Mobility   Outcome Measure  Help needed turning from your back to your side while in a flat bed without using bedrails?: A Lot Help needed moving from lying on your back to sitting on the side of a flat bed without using bedrails?: Total Help needed moving to and from a bed to a chair (including a wheelchair)?: Total Help needed standing up from a chair using your arms (e.g., wheelchair or bedside chair)?: A Lot Help needed to walk in hospital room?: Total Help needed climbing 3-5 steps with a railing? : Total 6 Click Score: 8    End of Session Equipment Utilized During Treatment: Gait belt Activity Tolerance: Patient limited by fatigue;Patient  limited by pain Patient left: in bed;with call bell/phone within reach;with bed alarm set Nurse Communication: Mobility status;Patient requests pain meds;Other (comment) (Pt's request for SCDs to be removed for a little while) PT Visit Diagnosis: Muscle weakness (generalized) (M62.81);Difficulty in walking, not elsewhere classified (R26.2);Pain Pain - Right/Left: Left (Right) Pain - part of body: Hip     Time: 1440-1458 PT Time Calculation (min) (ACUTE ONLY): 18 min  Charges:                           Kaeleb Emond, SPT 09/13/23, 3:33 PM

## 2023-09-13 NOTE — Progress Notes (Signed)
 Subjective: 4 Days Post-Op Procedure(s) (LRB): FIXATION, FRACTURE, INTERTROCHANTERIC, WITH INTRAMEDULLARY ROD (Left) Patient reports pain as mild.   Patient is well, and has had no acute complaints or problems Denies any CP, SOB, ABD pain. We will continue therapy today.  Plan is to go Skilled nursing facility after hospital stay.  Objective: Vital signs in last 24 hours: Temp:  [97.1 F (36.2 C)-98.1 F (36.7 C)] 97.1 F (36.2 C) (06/18 0455) Pulse Rate:  [65-99] 65 (06/18 0455) Resp:  [16-18] 18 (06/18 0455) BP: (91-161)/(56-130) 126/84 (06/18 0605) SpO2:  [86 %-96 %] 89 % (06/18 0455)  Intake/Output from previous day: 06/17 0701 - 06/18 0700 In: -  Out: 300 [Urine:300] Intake/Output this shift: No intake/output data recorded.  Recent Labs    09/11/23 0117 09/12/23 0341 09/13/23 0351  HGB 9.4* 10.3* 9.0*   Recent Labs    09/12/23 0341 09/13/23 0351  WBC 9.0 8.1  RBC 3.89 3.45*  HCT 34.6* 29.2*  PLT 194 233   Recent Labs    09/12/23 0341 09/13/23 0351  NA 133* 133*  K 5.0 4.3  CL 94* 90*  CO2 23 29  BUN 42* 55*  CREATININE 1.69* 1.86*  GLUCOSE 80 105*  CALCIUM 8.8* 9.1   No results for input(s): LABPT, INR in the last 72 hours.   EXAM General - Patient is Alert and Appropriate Extremity - Neurovascular intact Sensation intact distally Intact pulses distally Dorsiflexion/Plantar flexion intact Dressing - dressing C/D/I and no drainage Motor Function - intact, moving foot and toes well on exam.   Past Medical History:  Diagnosis Date   Arthritis    Cancer (HCC)    ovarian, skin face- skin cancer   Complication of anesthesia    slow to awaken everday, even slower with anesthesia   Constipation    Diabetes mellitus    Type II   GERD (gastroesophageal reflux disease)    Headache 05/12/2015   after a fall headache for 8 months   Hemorrhoids    Hernia    History of kidney stones    passed   Hypertension    Hypothyroidism     Loss of appetite    Muscle spasm    back   Osteoporosis    Redness    abd wound   Urine frequency    unable to hold urine    Wears hearing aid    bilateral     Assessment/Plan:   4 Days Post-Op Procedure(s) (LRB): FIXATION, FRACTURE, INTERTROCHANTERIC, WITH INTRAMEDULLARY ROD (Left) Principal Problem:   Closed left hip fracture, initial encounter (HCC) Active Problems:   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   Weight as of this encounter: 88.5 kg. Advance diet Up with therapy, sow progress with PT Pain well controlled Labs and VSS CM to assist with discharge to SNF  Follow up with [facility] in 2 weeks   DVT Prophylaxis - TED hose and SCDs Eliquis  Weight-Bearing as tolerated to left leg   T. Larinda Plover  Trudi Fus [facility] 09/13/2023, 8:10 AM

## 2023-09-13 NOTE — Progress Notes (Signed)
  PROGRESS NOTE    Stacy Kirk  ZOX:[id number] DOB: [birth year] DOA: 09/08/2023 PCP: Stacy Gaster, NP  140A/140A-AA  LOS: 4 days   Brief hospital course:   Assessment & Plan: Stacy Kirk is a 78 y.o. female with medical history significant of dCHF, PE on Eliquis , HTN, DM, hypothyroidism, GI bleeding, CKD-3A, lymphedema, hard of hearing, ovarian cancer, lumbar spinal stenosis, who presents with fall and left hip pain.    Closed left hip fracture :  S/p left hip intramedullary nail on 09/09/23  --pain control --PT/OT, SNF rehab   Fall: at home.  CT head shows no acute intracranial findings   Urinary retention: Foley d/c on 09/10/23. Resolved    Pulmonary embolism:  --cont Eliquis    Chronic diastolic CHF:  echo on 07/26/2023 showed EF of 50-55%.  Appears compensated.  --cont Toprol  --hold torsemide  and aldactone  due to Cr increase   HTN:  --cont Toprol  --hold torsemide  and aldactone  due to Cr increase   CKDIIIa:  --Cr trending up --NS@50  for 20 hours --hold torsemide  and aldactone     Hypothyroidism: continue on home dose of levothyroxine      DM2: HbA1c 6.9, well-controlled.  --d/c BG checks and SSI   Lumbar stenosis: w/ chronic lower back pain, but now has worsening back pain in the middle back   Chronic L1 compression fracture:  percocet, morphine  prn for pain. PT recs SNF. Pt is agreeable to SNF    Obesity: BMI 38.1. Complicates overall care & prognosis.   Hyponatremia --Na low 130's --monitor   DVT prophylaxis: On:Eliquis  Code Status: Full code  Family Communication:  Level of care: Telemetry Medical Dispo:   The patient is from: home Anticipated d/c is to: SNF rehab Anticipated d/c date is: 1-2 days   Subjective and Interval History:  No dyspnea.  Complained of pain in the left LE.  Pt reported good oral hydration, however, Cr has been trending up.   Objective: Vitals:   09/13/23 0605 09/13/23 0831 09/13/23 1800 09/13/23 2043   BP: 126/84 (!) 102/49 93/69 (!) 138/91  Pulse:  97 98 91  Resp:  18 17 18   Temp:  99 F (37.2 C) 98.7 F (37.1 C) 98.7 F (37.1 C)  TempSrc:  Oral    SpO2:  96% 95% 97%  Weight:      Height:        Intake/Output Summary (Last 24 hours) at 09/13/2023 2133 Last data filed at 09/13/2023 1800 Gross per 24 hour  Intake 235.95 ml  Output 500 ml  Net -264.05 ml   Filed Weights   09/08/23 2322  Weight: 88.5 kg    Examination:   Constitutional: NAD, AAOx3 HEENT: conjunctivae and lids normal, EOMI CV: No cyanosis.   RESP: normal respiratory effort, on 2L Neuro: II - XII grossly intact.   Psych: Normal mood and affect.     Data Reviewed: I have personally reviewed labs and imaging studies  Time spent: 50 minutes  Garrison Kanner, MD [facility] If 7PM-7AM, please contact night-coverage 09/13/2023, 9:33 PM

## 2023-09-13 NOTE — TOC Progression Note (Signed)
 Transition of Care [facility]) - Progression Note    Patient Details  Name: Stacy Kirk MRN: [id number] Date of Birth: February 03, 1946  Transition of Care [facility] CM/SW Contact  Zoe Hinds, RN Phone Number: 09/13/2023, 10:46 AM  Clinical Narrative:    This CM confirmed Bed offer at [facility] spoke with Admission Liaison [place] when pt is medically cleared to dc. TOC will cont to follow dc planning / care coordination and update as applicable.    Expected Discharge Plan: Skilled Nursing Facility Barriers to Discharge: No Barriers Identified  Expected Discharge Plan and Services   Discharge Planning Services: CM Consult Post Acute Care Choice: Skilled Nursing Facility Living arrangements for the past 2 months: Single Family Home                                       Social Determinants of Health (SDOH) Interventions SDOH Screenings   Food Insecurity: No Food Insecurity (09/09/2023)  Housing: Low Risk  (09/09/2023)  Transportation Needs: No Transportation Needs (09/09/2023)  Utilities: Not At Risk (09/09/2023)  Social Connections: Unknown (09/09/2023)  Tobacco Use: Medium Risk (09/08/2023)    Readmission Risk Interventions     No data to display

## 2023-09-13 NOTE — Progress Notes (Signed)
 PT Cancellation Note  Patient Details Name: LOLETHA BERTINI MRN: [id number] DOB: Feb 07, 1946   Cancelled Treatment:    Reason Eval/Treat Not Completed: PT attempted to provide services 2x this morning. First attempt, pt stated to come back closer to 10am. 2nd attempt, pt was obtunded and refused therapy secondary to increased fatigue/lethargy limiting ability to participate;Other (comment) (PT suggested a reduced exercise load today with a main focus on bed mobility, pt stated hell no)   Chiana Wamser 09/13/2023, 9:45 AM

## 2023-09-14 DIAGNOSIS — S79929A Unspecified injury of unspecified thigh, initial encounter: Secondary | ICD-10-CM | POA: Diagnosis not present

## 2023-09-14 DIAGNOSIS — I2699 Other pulmonary embolism without acute cor pulmonale: Secondary | ICD-10-CM | POA: Diagnosis not present

## 2023-09-14 DIAGNOSIS — E1129 Type 2 diabetes mellitus with other diabetic kidney complication: Secondary | ICD-10-CM | POA: Diagnosis not present

## 2023-09-14 DIAGNOSIS — S72302D Unspecified fracture of shaft of left femur, subsequent encounter for closed fracture with routine healing: Secondary | ICD-10-CM | POA: Diagnosis not present

## 2023-09-14 DIAGNOSIS — E119 Type 2 diabetes mellitus without complications: Secondary | ICD-10-CM | POA: Diagnosis not present

## 2023-09-14 DIAGNOSIS — I130 Hypertensive heart and chronic kidney disease with heart failure and stage 1 through stage 4 chronic kidney disease, or unspecified chronic kidney disease: Secondary | ICD-10-CM | POA: Diagnosis not present

## 2023-09-14 DIAGNOSIS — S72002A Fracture of unspecified part of neck of left femur, initial encounter for closed fracture: Secondary | ICD-10-CM | POA: Diagnosis not present

## 2023-09-14 DIAGNOSIS — Z7401 Bed confinement status: Secondary | ICD-10-CM | POA: Diagnosis not present

## 2023-09-14 DIAGNOSIS — I10 Essential (primary) hypertension: Secondary | ICD-10-CM | POA: Diagnosis not present

## 2023-09-14 DIAGNOSIS — M48061 Spinal stenosis, lumbar region without neurogenic claudication: Secondary | ICD-10-CM | POA: Diagnosis not present

## 2023-09-14 DIAGNOSIS — I5030 Unspecified diastolic (congestive) heart failure: Secondary | ICD-10-CM | POA: Diagnosis not present

## 2023-09-14 DIAGNOSIS — S7292XA Unspecified fracture of left femur, initial encounter for closed fracture: Secondary | ICD-10-CM | POA: Diagnosis not present

## 2023-09-14 DIAGNOSIS — Z96642 Presence of left artificial hip joint: Secondary | ICD-10-CM | POA: Diagnosis not present

## 2023-09-14 DIAGNOSIS — S72142D Displaced intertrochanteric fracture of left femur, subsequent encounter for closed fracture with routine healing: Secondary | ICD-10-CM | POA: Diagnosis not present

## 2023-09-14 DIAGNOSIS — R0989 Other specified symptoms and signs involving the circulatory and respiratory systems: Secondary | ICD-10-CM | POA: Diagnosis not present

## 2023-09-14 DIAGNOSIS — N1831 Chronic kidney disease, stage 3a: Secondary | ICD-10-CM | POA: Diagnosis not present

## 2023-09-14 DIAGNOSIS — I5032 Chronic diastolic (congestive) heart failure: Secondary | ICD-10-CM | POA: Diagnosis not present

## 2023-09-14 DIAGNOSIS — Z86711 Personal history of pulmonary embolism: Secondary | ICD-10-CM | POA: Diagnosis not present

## 2023-09-14 DIAGNOSIS — Z7901 Long term (current) use of anticoagulants: Secondary | ICD-10-CM | POA: Diagnosis not present

## 2023-09-14 DIAGNOSIS — R296 Repeated falls: Secondary | ICD-10-CM | POA: Diagnosis not present

## 2023-09-14 DIAGNOSIS — E871 Hypo-osmolality and hyponatremia: Secondary | ICD-10-CM | POA: Diagnosis not present

## 2023-09-14 DIAGNOSIS — R531 Weakness: Secondary | ICD-10-CM | POA: Diagnosis not present

## 2023-09-14 DIAGNOSIS — N179 Acute kidney failure, unspecified: Secondary | ICD-10-CM | POA: Diagnosis not present

## 2023-09-14 DIAGNOSIS — I129 Hypertensive chronic kidney disease with stage 1 through stage 4 chronic kidney disease, or unspecified chronic kidney disease: Secondary | ICD-10-CM | POA: Diagnosis not present

## 2023-09-14 DIAGNOSIS — R2689 Other abnormalities of gait and mobility: Secondary | ICD-10-CM | POA: Diagnosis not present

## 2023-09-14 DIAGNOSIS — E669 Obesity, unspecified: Secondary | ICD-10-CM | POA: Diagnosis not present

## 2023-09-14 DIAGNOSIS — S91302A Unspecified open wound, left foot, initial encounter: Secondary | ICD-10-CM | POA: Diagnosis not present

## 2023-09-14 DIAGNOSIS — S72002D Fracture of unspecified part of neck of left femur, subsequent encounter for closed fracture with routine healing: Secondary | ICD-10-CM | POA: Diagnosis not present

## 2023-09-14 DIAGNOSIS — I110 Hypertensive heart disease with heart failure: Secondary | ICD-10-CM | POA: Diagnosis not present

## 2023-09-14 DIAGNOSIS — Z5189 Encounter for other specified aftercare: Secondary | ICD-10-CM | POA: Diagnosis not present

## 2023-09-14 DIAGNOSIS — I1310 Hypertensive heart and chronic kidney disease without heart failure, with stage 1 through stage 4 chronic kidney disease, or unspecified chronic kidney disease: Secondary | ICD-10-CM | POA: Diagnosis not present

## 2023-09-14 DIAGNOSIS — E1122 Type 2 diabetes mellitus with diabetic chronic kidney disease: Secondary | ICD-10-CM | POA: Diagnosis not present

## 2023-09-14 DIAGNOSIS — M6281 Muscle weakness (generalized): Secondary | ICD-10-CM | POA: Diagnosis not present

## 2023-09-14 LAB — BASIC METABOLIC PANEL WITH GFR
Anion gap: 7 (ref 5–15)
BUN: 59 mg/dL — ABNORMAL HIGH (ref 8–23)
CO2: 33 mmol/L — ABNORMAL HIGH (ref 22–32)
Calcium: 8.9 mg/dL (ref 8.9–10.3)
Chloride: 92 mmol/L — ABNORMAL LOW (ref 98–111)
Creatinine, Ser: 1.47 mg/dL — ABNORMAL HIGH (ref 0.44–1.00)
GFR, Estimated: 36 mL/min — ABNORMAL LOW (ref >60)
Glucose, Bld: 110 mg/dL — ABNORMAL HIGH (ref 70–99)
Potassium: 4.1 mmol/L (ref 3.5–5.1)
Sodium: 132 mmol/L — ABNORMAL LOW (ref 135–145)

## 2023-09-14 LAB — GLUCOSE, CAPILLARY: Glucose-Capillary: 106 mg/dL — ABNORMAL HIGH (ref 70–99)

## 2023-09-14 LAB — CBC
HCT: 26.7 % — ABNORMAL LOW (ref 36.0–46.0)
Hemoglobin: 8.1 g/dL — ABNORMAL LOW (ref 12.0–15.0)
MCH: 25.6 pg — ABNORMAL LOW (ref 26.0–34.0)
MCHC: 30.3 g/dL (ref 30.0–36.0)
MCV: 84.5 fL (ref 80.0–100.0)
Platelets: 248 10*3/uL (ref 150–400)
RBC: 3.16 MIL/uL — ABNORMAL LOW (ref 3.87–5.11)
RDW: 19.1 % — ABNORMAL HIGH (ref 11.5–15.5)
WBC: 7.5 10*3/uL (ref 4.0–10.5)
nRBC: 0 % (ref 0.0–0.2)

## 2023-09-14 LAB — MAGNESIUM: Magnesium: 2.1 mg/dL (ref 1.7–2.4)

## 2023-09-14 MED ORDER — BOOST PLUS PO LIQD: 237.0000 mL | Freq: Two times a day (BID) | ORAL | Status: AC

## 2023-09-14 MED ORDER — POLYETHYLENE GLYCOL 3350 17 G PO PACK
34.0000 g | PACK | ORAL | Status: DC
  Administered 2023-09-14: 34 g via ORAL
  Filled 2023-09-14: qty 2

## 2023-09-14 MED ORDER — FLEET ENEMA RE ENEM
1.0000 | ENEMA | Freq: Once | RECTAL | Status: AC
  Administered 2023-09-14: 1 via RECTAL

## 2023-09-14 MED ORDER — LIDOCAINE 5 % EX PTCH: 1.0000 | MEDICATED_PATCH | Freq: Every day | CUTANEOUS | Status: DC

## 2023-09-14 MED ORDER — MAGNESIUM CITRATE PO SOLN
1.0000 | Freq: Once | ORAL | Status: DC
  Filled 2023-09-14: qty 296

## 2023-09-14 MED ORDER — METHOCARBAMOL 500 MG PO TABS: 500.0000 mg | ORAL_TABLET | Freq: Three times a day (TID) | ORAL | Status: DC | PRN

## 2023-09-14 MED ORDER — RENA-VITE PO TABS: 1.0000 | ORAL_TABLET | Freq: Every day | ORAL | Status: AC

## 2023-09-14 NOTE — Plan of Care (Signed)
  Problem: Education: Goal: Ability to describe self-care measures that may prevent or decrease complications (Diabetes Survival Skills Education) will improve Outcome: Adequate for Discharge Goal: Individualized Educational Video(s) Outcome: Adequate for Discharge   Problem: Coping: Goal: Ability to adjust to condition or change in health will improve Outcome: Adequate for Discharge   Problem: Fluid Volume: Goal: Ability to maintain a balanced intake and output will improve Outcome: Adequate for Discharge   Problem: Health Behavior/Discharge Planning: Goal: Ability to identify and utilize available resources and services will improve Outcome: Adequate for Discharge Goal: Ability to manage health-related needs will improve Outcome: Adequate for Discharge   Problem: Metabolic: Goal: Ability to maintain appropriate glucose levels will improve Outcome: Adequate for Discharge   Problem: Nutritional: Goal: Maintenance of adequate nutrition will improve Outcome: Adequate for Discharge Goal: Progress toward achieving an optimal weight will improve Outcome: Adequate for Discharge   Problem: Skin Integrity: Goal: Risk for impaired skin integrity will decrease Outcome: Adequate for Discharge   Problem: Tissue Perfusion: Goal: Adequacy of tissue perfusion will improve Outcome: Adequate for Discharge   Problem: Education: Goal: Knowledge of General Education information will improve Description: Including pain rating scale, medication(s)/side effects and non-pharmacologic comfort measures Outcome: Adequate for Discharge   Problem: Health Behavior/Discharge Planning: Goal: Ability to manage health-related needs will improve Outcome: Adequate for Discharge   Problem: Clinical Measurements: Goal: Ability to maintain clinical measurements within normal limits will improve Outcome: Adequate for Discharge Goal: Will remain free from infection Outcome: Adequate for Discharge Goal:  Diagnostic test results will improve Outcome: Adequate for Discharge Goal: Respiratory complications will improve Outcome: Adequate for Discharge Goal: Cardiovascular complication will be avoided Outcome: Adequate for Discharge   Problem: Activity: Goal: Risk for activity intolerance will decrease Outcome: Adequate for Discharge   Problem: Nutrition: Goal: Adequate nutrition will be maintained Outcome: Adequate for Discharge   Problem: Coping: Goal: Level of anxiety will decrease Outcome: Adequate for Discharge   Problem: Elimination: Goal: Will not experience complications related to bowel motility Outcome: Adequate for Discharge Goal: Will not experience complications related to urinary retention Outcome: Adequate for Discharge   Problem: Pain Managment: Goal: General experience of comfort will improve and/or be controlled Outcome: Adequate for Discharge   Problem: Safety: Goal: Ability to remain free from injury will improve Outcome: Adequate for Discharge   Problem: Skin Integrity: Goal: Risk for impaired skin integrity will decrease Outcome: Adequate for Discharge   Problem: Education: Goal: Verbalization of understanding the information provided (i.e., activity precautions, restrictions, etc) will improve Outcome: Adequate for Discharge Goal: Individualized Educational Video(s) Outcome: Adequate for Discharge   Problem: Activity: Goal: Ability to ambulate and perform ADLs will improve Outcome: Adequate for Discharge   Problem: Clinical Measurements: Goal: Postoperative complications will be avoided or minimized Outcome: Adequate for Discharge   Problem: Self-Concept: Goal: Ability to maintain and perform role responsibilities to the fullest extent possible will improve Outcome: Adequate for Discharge   Problem: Pain Management: Goal: Pain level will decrease Outcome: Adequate for Discharge

## 2023-09-14 NOTE — TOC Progression Note (Signed)
 Transition of Care [facility]) - Progression Note    Patient Details  Name: Stacy Kirk MRN: [id number] Date of Birth: 1945-09-30  [facility] CM/SW Contact  Alexandra Ice, RN Phone Number: 09/14/2023, 9:39 AM  Clinical Narrative:     Patient has [employer] approval for [facility], Approved   Plan AuthID:  [id number]  Dates:  6/18-6/20/25   Next Review Date:  09/15/2023    Expected Discharge Plan: Skilled Nursing Facility Barriers to Discharge: No Barriers Identified  Expected Discharge Plan and Services   Discharge Planning Services: CM Consult Post Acute Care Choice: Skilled Nursing Facility Living arrangements for the past 2 months: Single Family Home Expected Discharge Date: 09/14/23                                     Social Determinants of Health (SDOH) Interventions SDOH Screenings   Food Insecurity: No Food Insecurity (09/09/2023)  Housing: Low Risk  (09/09/2023)  Transportation Needs: No Transportation Needs (09/09/2023)  Utilities: Not At Risk (09/09/2023)  Social Connections: Unknown (09/09/2023)  Tobacco Use: Medium Risk (09/08/2023)    Readmission Risk Interventions     No data to display

## 2023-09-14 NOTE — TOC Transition Note (Signed)
 Transition of Care [facility]) - Discharge Note   Patient Details  Name: Stacy Kirk MRN: [id number] Date of Birth: Feb 19, 1946  Transition of Care [facility]) CM/SW Contact:  Alexandra Ice, RN Phone Number: 09/14/2023, 10:37 AM   Clinical Narrative:    Patient to discharge today to [facility]. Discharge summary and orders sent to facility via HUB. Patient going to room 105B, nurse to call report to [phone]. TOC spoke with Myrtie Atkinson at [facility], scheduled for 3pm pick up time. EMS packet printed to nurse station. Notifed MD and bedside nurse.    Final next level of care: Skilled Nursing Facility Barriers to Discharge: Barriers Unresolved (comment)   Patient Goals and CMS Choice Patient states their goals for this hospitalization and ongoing recovery are:: [name] To transition SNF for rehab CMS Medicare.gov Compare Post Acute Care list provided to:: Patient Choice offered to / list presented to : Patient      Discharge Placement                Patient to be transferred to facility by: [facility] Name of family member notified: Gwen Lek Patient and family notified of of transfer: 09/14/23  Discharge Plan and Services Additional resources added to the After Visit Summary for     Discharge Planning Services: CM Consult Post Acute Care Choice: Skilled Nursing Facility            DME Agency: NA       HH Arranged: NA          Social Drivers of Health (SDOH) Interventions SDOH Screenings   Food Insecurity: No Food Insecurity (09/09/2023)  Housing: Low Risk  (09/09/2023)  Transportation Needs: No Transportation Needs (09/09/2023)  Utilities: Not At Risk (09/09/2023)  Social Connections: Unknown (09/09/2023)  Tobacco Use: Medium Risk (09/08/2023)     Readmission Risk Interventions     No data to display

## 2023-09-14 NOTE — Progress Notes (Signed)
 Report called to Debria Fang, LPN at [facility]. This RN made receiving LPN aware that no BM has been charted on patient since admission but that multiple bowel medications have been given today and still no BM. Debria Fang stated that this was fine and patient could still be transported today. [facility] here now to transport patient.

## 2023-09-14 NOTE — Progress Notes (Signed)
 Physical Therapy Treatment Patient Details Name: Stacy Kirk MRN: [id number] DOB: Feb 24, 1946 Today's Date: 09/14/2023   History of Present Illness presented to ER secondary to mechanical fall going to bathroom, acute onset of L hip, LBP; admitted for management of L1 compression fracture, comminuted L intertrochanteric hip fracture, s/p L IMN (09/09/23), WBAT.    PT Comments  Pt was sound asleep upon arrival. She remains lethargic and very resistive to session. Pt unwilling to perform any/all desired task. Max encouragement and education provided on importance of OOB activity and exercises to promote return to PLOF. Pt seems more disoriented than observed two days prior however author questions if due to pain medication recently received. Pt remains unwilling to perform anything other log roll R to allow RN to start enema. Acute PT will continue to follow and progress as able per current POC.    If plan is discharge home, recommend the following: A lot of help with bathing/dressing/bathroom;A lot of help with walking and/or transfers;Assistance with cooking/housework;Direct supervision/assist for medications management;Direct supervision/assist for financial management;Assist for transportation;Supervision due to cognitive status;Help with stairs or ramp for entrance   Can travel by private vehicle     No  Equipment Recommendations  Rolling walker (2 wheels);BSC/3in1       Precautions / Restrictions Precautions Precautions: Fall Recall of Precautions/Restrictions: Impaired Restrictions Weight Bearing Restrictions Per Provider Order: Yes LLE Weight Bearing Per Provider Order: Weight bearing as tolerated     Mobility  Bed Mobility Overal bed mobility: Needs Assistance Bed Mobility: Rolling Rolling: Max assist, Total assist, Used rails, +2 for safety/equipment  General bed mobility comments: Pt extremely resistive to attempting any desired task. Required max-total assist to roll R  to allow RN to perform enema    Transfers  General transfer comment: pt unwilling at this time       Balance Overall balance assessment: Needs assistance     Communication Communication Communication: Impaired  Cognition Arousal: Suspect due to medications, Lethargic, Alert Behavior During Therapy: Agitated   PT - Cognitive impairments: No family/caregiver present to determine baseline, Awareness, Memory, Attention, Initiation, Sequencing, Problem solving, Safety/Judgement, Orientation      PT - Cognition Comments: Pt was sound asleep upon arrival. She gets very agitated quickly when author encouraged any/all participation. Pt unwilling to attempt desired task but did allow author to assist her with rolling to allow RN  start enema. Pt yells out in fear/pain with any/all movements Following commands: Impaired Following commands impaired: Follows one step commands inconsistently    Cueing Cueing Techniques: Verbal cues, Gestural cues, Tactile cues     General Comments General comments (skin integrity, edema, etc.): Alot of time spent educating pt on importance of performing routine mobility and OOB activity if she wishes to return to PLOF. Leave me alone!!      Pertinent Vitals/Pain Pain Assessment Pain Assessment: PAINAD Breathing: occasional labored breathing, short period of hyperventilation Negative Vocalization: repeated troubled calling out, loud moaning/groaning, crying Facial Expression: facial grimacing Body Language: rigid, fists clenched, knees up, pushing/pulling away, strikes out Consolability: distracted or reassured by voice/touch PAINAD Score: 8 Pain Location: L hip Pain Descriptors / Indicators: Grimacing, Discomfort, Moaning Pain Intervention(s): Limited activity within patient's tolerance, Monitored during session, Premedicated before session, Repositioned     PT Goals (current goals can now be found in the care plan section) Acute Rehab PT  Goals Patient Stated Goal: go home to be with my autistic son Progress towards PT goals: Progressing toward goals  Frequency    7X/week       AM-PAC PT 6 Clicks Mobility   Outcome Measure  Help needed turning from your back to your side while in a flat bed without using bedrails?: A Lot Help needed moving from lying on your back to sitting on the side of a flat bed without using bedrails?: A Lot Help needed moving to and from a bed to a chair (including a wheelchair)?: Total Help needed standing up from a chair using your arms (e.g., wheelchair or bedside chair)?: Total Help needed to walk in hospital room?: Total Help needed climbing 3-5 steps with a railing? : Total 6 Click Score: 8    End of Session   Activity Tolerance: Patient limited by pain;Other (comment) (self limiting) Patient left: in bed;with call bell/phone within reach;with bed alarm set Nurse Communication: Mobility status;Patient requests pain meds;Other (comment) PT Visit Diagnosis: Muscle weakness (generalized) (M62.81);Difficulty in walking, not elsewhere classified (R26.2);Pain Pain - Right/Left: Left Pain - part of body: Hip     Time: 1120-1130 PT Time Calculation (min) (ACUTE ONLY): 10 min  Charges:    $Therapeutic Activity: 8-22 mins PT General Charges $$ ACUTE PT VISIT: 1 Visit                     Chester Costa PTA 09/14/23, 12:20 PM

## 2023-09-14 NOTE — Discharge Summary (Signed)
 Physician Discharge Summary   Stacy Kirk  female DOB: [birth year]  ZOX:[id number]  PCP: Dorothe Gaster, NP  Admit date: 09/08/2023 Discharge date: 09/14/2023  Admitted From: home Disposition:  SNF rehab CODE STATUS: Full code  Discharge Instructions     No wound care   Complete by: As directed       Hospital Course:  For full details, please see H&P, progress notes, consult notes and ancillary notes.  Briefly,  Stacy Kirk is a 78 y.o. female with medical history significant of dCHF, PE on Eliquis , HTN, DM, hypothyroidism, GI bleeding, CKD-3A, lymphedema, ovarian cancer, lumbar spinal stenosis, who presented with fall and left hip pain.    Closed left hip fracture :  S/p left hip intramedullary nail on 09/09/23  --pain control --PT/OT, SNF rehab --DVT Prophylaxis - Eliquis  --Weight-Bearing as tolerated to left leg --Follow up with [facility] in 2 weeks   Fall:  at home.  CT head showed no acute intracranial findings   Urinary retention, resolved Foley d/c on 09/10/23.    Pulmonary embolism:  --cont Eliquis    Chronic diastolic CHF:  echo on 07/26/2023 showed EF of 50-55%.  Appears compensated.  --cont Toprol  --hold torsemide  and aldactone  due to AKI and low normal blood pressure.    HTN:  --cont Toprol  --hold torsemide  and aldactone  due to AKI and low normal blood pressure.    AKI CKDIIIa:  --Cr improved with gentle after NS@50  for 20 hours.  Cr 1.47 prior to discharge. --hold torsemide  and aldactone     Hypothyroidism:  continue on home dose of levothyroxine      DM2:  HbA1c 6.9, well-controlled.  --resume metformin  after discharge.   Lumbar stenosis:  Chronic L1 compression fracture:  w/ chronic lower back pain, but now has worsening back pain in the middle back   Obesity: BMI 38.1.  Complicates overall care & prognosis.    Hyponatremia --Na low 130's, stable   Discharge Diagnoses:  Principal Problem:   Closed left hip fracture,  initial encounter [facility]) Active Problems:   Fall at home, initial encounter   Pulmonary embolism (HCC)   Chronic diastolic CHF (congestive heart failure) (HCC)   Hypertension   Chronic kidney disease, stage 3a (HCC)   Hypothyroidism   Type II diabetes mellitus with renal manifestations (HCC)   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-L5 level   Obesity (BMI 30-39.9)   Closed comminuted intertrochanteric fracture of proximal end of left femur (HCC)   30 Day Unplanned Readmission Risk Score    Flowsheet Row ED to Hosp-Admission (Current) from 09/08/2023 in [facility] (1A)  30 Day Unplanned Readmission Risk Score (%) 22.03 Filed at 09/14/2023 0801    This score is the patient's risk of an unplanned readmission within 30 days of being discharged (0 -100%). The score is based on dignosis, age, lab data, medications, orders, and past utilization.   Low:  0-14.9   Medium: 15-21.9   High: 22-29.9   Extreme: 30 and above         Discharge Instructions:  Allergies as of 09/14/2023       Reactions   Erythromycin Diarrhea, Other (See Comments)   Tears her stomach up. Major digestive upset.   Tramadol Shortness Of Breath, Other (See Comments)   Felt like lungs filled up; unable to lay down   Albumin (human) Other (See Comments)   Pt unsure of reaction   Aspirin  Itching, Other (See Comments)   Caused lethargy, also  Codeine Itching, Other (See Comments)   Cause lethargy, also   Cortisone Other (See Comments)   NO STEROID INJECTIONS; headaches   Linzess  [linaclotide ] Diarrhea   Propoxyphene Other (See Comments)   Terrible reaction; headaches   Simvastatin Itching, Other (See Comments)   Severe muscle aches   Triamcinolone Acetonide Other (See Comments)   headache        Medication List     PAUSE taking these medications    spironolactone  25 MG tablet Wait to take this until your doctor or other care provider tells you to start again. Due to AKI and low  normal blood pressure. Commonly known as: ALDACTONE  TAKE 1 TABLET (25 MG TOTAL) BY MOUTH DAILY.   torsemide  20 MG tablet Wait to take this until your doctor or other care provider tells you to start again. Due to AKI and low normal blood pressure. Commonly known as: DEMADEX  Take 2 tablets (40 mg total) by mouth daily.       TAKE these medications    acetaminophen  500 MG tablet Commonly known as: TYLENOL  Take 1,000 mg by mouth every 6 (six) hours as needed (FOR PAIN.).   cetirizine  10 MG tablet Commonly known as: ZYRTEC  Take 1 tablet (10 mg total) by mouth daily.   Eliquis  5 MG Tabs tablet Generic drug: apixaban  TAKE 1 TABLET BY MOUTH TWICE A DAY   ferrous sulfate  325 (65 FE) MG tablet TAKE 325 MG BY MOUTH DAILY.   fluticasone  50 MCG/ACT nasal spray Commonly known as: FLONASE  SPRAY 2 SPRAYS INTO EACH NOSTRIL EVERY DAY   HYDROcodone -acetaminophen  5-325 MG tablet Commonly known as: NORCO/VICODIN Take 1 tablet by mouth every 6 (six) hours as needed for moderate pain (pain score 4-6).   lactose free nutrition Liqd Take 237 mLs by mouth 2 (two) times daily between meals.   levothyroxine  100 MCG tablet Commonly known as: SYNTHROID  TAKE 1 TABLET BY MOUTH EVERY DAY   lidocaine  5 % Commonly known as: LIDODERM  Place 1-2 patches onto the skin at bedtime. Remove & Discard patch within 12 hours or as directed by MD   metFORMIN  500 MG tablet Commonly known as: GLUCOPHAGE  TAKE 1 TABLET (500 MG TOTAL) BY MOUTH DAILY AFTER SUPPER.   methocarbamol 500 MG tablet Commonly known as: ROBAXIN Take 1 tablet (500 mg total) by mouth every 8 (eight) hours as needed for muscle spasms.   metoprolol  succinate 25 MG 24 hr tablet Commonly known as: TOPROL -XL Take 1 tablet (25 mg total) by mouth daily.   multivitamin Tabs tablet Take 1 tablet by mouth at bedtime.   pantoprazole  40 MG tablet Commonly known as: PROTONIX  TAKE 1 TABLET BY MOUTH EVERY DAY         Follow-up  Information     Coralyn Derry, PA-C Follow up in 2 week(s).   Specialties: Orthopedic Surgery, Emergency Medicine Contact information: [address] [place] [address] [phone]         Dorothe Gaster, NP Follow up.   Specialties: Nurse Practitioner, Family Medicine Contact information: [address] [place] [address] [phone]                 Allergies  Allergen Reactions   Erythromycin Diarrhea and Other (See Comments)    Tears her stomach up. Major digestive upset.   Tramadol Shortness Of Breath and Other (See Comments)    Felt like lungs filled up; unable to lay down   Albumin (Human) Other (See Comments)  Pt unsure of reaction   Aspirin  Itching and Other (See Comments)    Caused lethargy, also   Codeine Itching and Other (See Comments)    Cause lethargy, also   Cortisone Other (See Comments)    NO STEROID INJECTIONS; headaches   Linzess  [Linaclotide ] Diarrhea   Propoxyphene Other (See Comments)    Terrible reaction; headaches   Simvastatin Itching and Other (See Comments)    Severe muscle aches   Triamcinolone Acetonide Other (See Comments)    headache     The results of significant diagnostics from this hospitalization (including imaging, microbiology, ancillary and laboratory) are listed below for reference.   Consultations:   Procedures/Studies: DG HIP UNILAT WITH PELVIS 2-3 VIEWS LEFT Result Date: 09/09/2023 CLINICAL DATA:  Left hip intramedullary nail. Intraoperative fluoroscopy. EXAM: DG HIP (WITH OR WITHOUT PELVIS) 2-3V LEFT COMPARISON:  Left hip radiographs 09/08/2023 FINDINGS: Images were performed intraoperatively without the presence of a radiologist. Interval left cephalomedullary nail fixation of the previously seen displaced intertrochanteric fracture. No hardware complication is seen. Total fluoroscopy images: 6 Total fluoroscopy time: 143 seconds. Total dose: Radiation Exposure Index (as provided by the  fluoroscopic device): 52.36 mGy air Kerma Please see intraoperative findings for further detail. IMPRESSION: Intraoperative fluoroscopy for left hip cephalomedullary nail fixation. Electronically Signed   By: Bertina Broccoli M.D.   On: 09/09/2023 16:37   DG C-Arm 1-60 Min-No Report Result Date: 09/09/2023 Fluoroscopy was utilized by the requesting physician.  No radiographic interpretation.   CT HEAD WO CONTRAST ( ) Result Date: 09/09/2023 CLINICAL DATA:  Fall with head trauma and back pain. EXAM: CT HEAD WITHOUT CONTRAST CT THORACIC AND LUMBAR SPINE WITHOUT CONTRAST TECHNIQUE: Multidetector CT imaging of the head was performed following the standard protocol from the skull base to vertex without intravenous contrast. Multiplanar CT image reconstructions were also generated. Multidetector CT imaging of the thoracic and lumbar spine was performed without contrast. Multiplanar CT image reconstructions were also generated. RADIATION DOSE REDUCTION: This exam was performed according to the departmental dose-optimization program which includes automated exposure control, adjustment of the mA and/or kV according to patient size and/or use of iterative reconstruction technique. COMPARISON:  Head CT 05/06/2023, lumbar spine CT 10/21/2022, and CTA chest 10/04/2022. Also, PA and lateral chest 06/30/2023. FINDINGS: CT HEAD FINDINGS Brain: There is moderately advanced cerebral atrophy with atrophic ventriculomegaly and moderate to severe small vessel disease of the cerebral white matter. There are lacunar infarcts in the right caudate head and external capsules, chronic. Additional chronic tiny lacunar infarcts in the thalami. The cerebellum and brainstem are unremarkable. No cortical based acute infarct, hemorrhage, mass or midline shift are seen. Basal cisterns are clear. Vascular: Patchy calcific plaques both siphons, both distal vertebral arteries. No hyperdense central vessels. Skull: Negative for fractures or focal  lesions. Sinuses/orbits: Old lens replacements with otherwise negative orbits. Clear sinuses and mastoids. Other: None. CT THORACIC SPINE FINDINGS Alignment: Mild chronic thoracic spine kyphodextroscoliosis. Trace chronic anterolisthesis T6-7. No traumatic or further listhesis. Vertebrae: Osteopenia. Bridging osteophytes and multilevel interbody ankylosis related to bridging osteophytes again is seen from T4-5 through T10-11, with background advanced spondylosis. There is reactive sclerosis in the vertebral bodies T4, T6, T8 and T10, which was seen previously and unchanged. No primary pathologic lesion or destructive vertebral mass is seen. There is spinous process enthesopathy, with spinous process bridging T8-9 and T10-11. Paraspinal and other soft tissues: The lungs show posterior atelectasis. There is no pleural effusion or pneumothorax in the visualized chest. Small caliber  central airways which could be due to tracheobronchomalacia, respiration or bronchospasm. The esophagus is patulous, mildly thickened but no more than previously. Small to moderate-sized hiatal hernia. The heart is moderately enlarged. There are coronary artery calcifications. Aortic atherosclerosis and uncoiling. Disc levels: The discs are collapsed in the thoracic spine at all levels from T2-3 through T11-12. At T6-7 there is a tiny central calcified disc extrusion but no resulting mass effect. There is minimal posterior endplate ridging at most levels but this does not significantly encroach on the thecal sac. No herniated discs or cord compromise are suspected. There are facet spurs in the lower third of the thoracic spine, at T10-11 causing moderate left foraminal stenosis, at T11-12 bilateral mild foraminal stenosis. The other foramina appear clear. Other: None. CT LUMBAR SPINE FINDINGS Segmentation: There are 4 lumbar type vertebrae. The lumbosacral junction is defined L4-S1. Alignment: Mild lumbar levoscoliosis apex at L1. Chronic  grade 1 anterolisthesis of L4-S1 is unchanged. Minimal grade 1 retrolisthesis L1-2 also chronic. Vertebrae: The prior study demonstrated an acute L1 compression fracture but with only mild loss of vertebral height. This has progressed with now up to 50% anterior and 30% posterior vertebral height loss with increased posterosuperior cortical retropulsion up to 4 mm lateralizing to the left. No acute fracture is seen. Again seen is L3-4 posterior fusion hardware with rods and pedicle screws and laminectomy defect, with interbody metal hardware. The fusion appears solid. There is definitely solid fusion over the facet joints. Generalized osteopenia. There is advanced spondylosis. No focal pathologic lesion is suspected. Reactive endplate sclerosis again noted T11-12 to the left. Paraspinal and other soft tissues: No paraspinal mass, hemorrhage or fluid collection. Heavy aortoiliac calcific plaque. Stable 2 cm right adrenal nodule, Hounsfield density is 30. Status post cholecystectomy. Low pelvic ureteral insertions consistent with pelvic floor laxity. Disc levels: The discs are diffusely degenerated. Retropulsion at T12-L1 mildly effaces the left ventral hemicanal. The foramina are clear. At L1-2, right paracentral disc osteophyte complex and mild facet spurring encroaches on the right lateral recess without frank spinal stenosis. No foraminal compromise. L2-3 again demonstrating multifactorial severe spinal canal stenosis with facet hypertrophy and disc osteophyte complex. The foramina are moderately stenotic. Stable fusion changes L3-4 with laminectomy. L4-S1 advanced facet hypertrophy and mild to moderate multifactorial spinal canal stenosis. The foramina are clear. The facets are ankylosed. IMPRESSION: 1. No acute intracranial CT findings or depressed skull fractures. 2. Chronic changes. 3. Osteopenia, scoliosis and degenerative change of the thoracic and lumbar spine without evidence of acute fractures. 4. 4  lumbar type vertebrae with the lumbosacral junction defined L4-S1. 5. Progressive L1 compression fracture with now up to 50% anterior and 30% posterior vertebral height loss and increased posterosuperior cortical retropulsion up to 4 mm lateralizing to the left. This now has a chronic appearance. 6. Stable surgical fusion change L3-4. 7. Multifactorial severe spinal canal stenosis at L2-3. 8. Mild to moderate multifactorial spinal canal stenosis at L4-S1. 9. Aortic and coronary artery atherosclerosis. 10. Stable 2 cm right adrenal nodule. 11. Hiatal hernia. 12. Patulous, mildly thickened esophagus. Aortic Atherosclerosis (ICD10-I70.0). Electronically Signed   By: Denman Fischer M.D.   On: 09/09/2023 03:36   CT LUMBAR SPINE WO CONTRAST Result Date: 09/09/2023 CLINICAL DATA:  Fall with head trauma and back pain. EXAM: CT HEAD WITHOUT CONTRAST CT THORACIC AND LUMBAR SPINE WITHOUT CONTRAST TECHNIQUE: Multidetector CT imaging of the head was performed following the standard protocol from the skull base to vertex without intravenous contrast. Multiplanar  CT image reconstructions were also generated. Multidetector CT imaging of the thoracic and lumbar spine was performed without contrast. Multiplanar CT image reconstructions were also generated. RADIATION DOSE REDUCTION: This exam was performed according to the departmental dose-optimization program which includes automated exposure control, adjustment of the mA and/or kV according to patient size and/or use of iterative reconstruction technique. COMPARISON:  Head CT 05/06/2023, lumbar spine CT 10/21/2022, and CTA chest 10/04/2022. Also, PA and lateral chest 06/30/2023. FINDINGS: CT HEAD FINDINGS Brain: There is moderately advanced cerebral atrophy with atrophic ventriculomegaly and moderate to severe small vessel disease of the cerebral white matter. There are lacunar infarcts in the right caudate head and external capsules, chronic. Additional chronic tiny lacunar  infarcts in the thalami. The cerebellum and brainstem are unremarkable. No cortical based acute infarct, hemorrhage, mass or midline shift are seen. Basal cisterns are clear. Vascular: Patchy calcific plaques both siphons, both distal vertebral arteries. No hyperdense central vessels. Skull: Negative for fractures or focal lesions. Sinuses/orbits: Old lens replacements with otherwise negative orbits. Clear sinuses and mastoids. Other: None. CT THORACIC SPINE FINDINGS Alignment: Mild chronic thoracic spine kyphodextroscoliosis. Trace chronic anterolisthesis T6-7. No traumatic or further listhesis. Vertebrae: Osteopenia. Bridging osteophytes and multilevel interbody ankylosis related to bridging osteophytes again is seen from T4-5 through T10-11, with background advanced spondylosis. There is reactive sclerosis in the vertebral bodies T4, T6, T8 and T10, which was seen previously and unchanged. No primary pathologic lesion or destructive vertebral mass is seen. There is spinous process enthesopathy, with spinous process bridging T8-9 and T10-11. Paraspinal and other soft tissues: The lungs show posterior atelectasis. There is no pleural effusion or pneumothorax in the visualized chest. Small caliber central airways which could be due to tracheobronchomalacia, respiration or bronchospasm. The esophagus is patulous, mildly thickened but no more than previously. Small to moderate-sized hiatal hernia. The heart is moderately enlarged. There are coronary artery calcifications. Aortic atherosclerosis and uncoiling. Disc levels: The discs are collapsed in the thoracic spine at all levels from T2-3 through T11-12. At T6-7 there is a tiny central calcified disc extrusion but no resulting mass effect. There is minimal posterior endplate ridging at most levels but this does not significantly encroach on the thecal sac. No herniated discs or cord compromise are suspected. There are facet spurs in the lower third of the thoracic  spine, at T10-11 causing moderate left foraminal stenosis, at T11-12 bilateral mild foraminal stenosis. The other foramina appear clear. Other: None. CT LUMBAR SPINE FINDINGS Segmentation: There are 4 lumbar type vertebrae. The lumbosacral junction is defined L4-S1. Alignment: Mild lumbar levoscoliosis apex at L1. Chronic grade 1 anterolisthesis of L4-S1 is unchanged. Minimal grade 1 retrolisthesis L1-2 also chronic. Vertebrae: The prior study demonstrated an acute L1 compression fracture but with only mild loss of vertebral height. This has progressed with now up to 50% anterior and 30% posterior vertebral height loss with increased posterosuperior cortical retropulsion up to 4 mm lateralizing to the left. No acute fracture is seen. Again seen is L3-4 posterior fusion hardware with rods and pedicle screws and laminectomy defect, with interbody metal hardware. The fusion appears solid. There is definitely solid fusion over the facet joints. Generalized osteopenia. There is advanced spondylosis. No focal pathologic lesion is suspected. Reactive endplate sclerosis again noted T11-12 to the left. Paraspinal and other soft tissues: No paraspinal mass, hemorrhage or fluid collection. Heavy aortoiliac calcific plaque. Stable 2 cm right adrenal nodule, Hounsfield density is 30. Status post cholecystectomy. Low pelvic ureteral insertions consistent  with pelvic floor laxity. Disc levels: The discs are diffusely degenerated. Retropulsion at T12-L1 mildly effaces the left ventral hemicanal. The foramina are clear. At L1-2, right paracentral disc osteophyte complex and mild facet spurring encroaches on the right lateral recess without frank spinal stenosis. No foraminal compromise. L2-3 again demonstrating multifactorial severe spinal canal stenosis with facet hypertrophy and disc osteophyte complex. The foramina are moderately stenotic. Stable fusion changes L3-4 with laminectomy. L4-S1 advanced facet hypertrophy and mild to  moderate multifactorial spinal canal stenosis. The foramina are clear. The facets are ankylosed. IMPRESSION: 1. No acute intracranial CT findings or depressed skull fractures. 2. Chronic changes. 3. Osteopenia, scoliosis and degenerative change of the thoracic and lumbar spine without evidence of acute fractures. 4. 4 lumbar type vertebrae with the lumbosacral junction defined L4-S1. 5. Progressive L1 compression fracture with now up to 50% anterior and 30% posterior vertebral height loss and increased posterosuperior cortical retropulsion up to 4 mm lateralizing to the left. This now has a chronic appearance. 6. Stable surgical fusion change L3-4. 7. Multifactorial severe spinal canal stenosis at L2-3. 8. Mild to moderate multifactorial spinal canal stenosis at L4-S1. 9. Aortic and coronary artery atherosclerosis. 10. Stable 2 cm right adrenal nodule. 11. Hiatal hernia. 12. Patulous, mildly thickened esophagus. Aortic Atherosclerosis (ICD10-I70.0). Electronically Signed   By: Denman Fischer M.D.   On: 09/09/2023 03:36   CT THORACIC SPINE WO CONTRAST Result Date: 09/09/2023 CLINICAL DATA:  Fall with head trauma and back pain. EXAM: CT HEAD WITHOUT CONTRAST CT THORACIC AND LUMBAR SPINE WITHOUT CONTRAST TECHNIQUE: Multidetector CT imaging of the head was performed following the standard protocol from the skull base to vertex without intravenous contrast. Multiplanar CT image reconstructions were also generated. Multidetector CT imaging of the thoracic and lumbar spine was performed without contrast. Multiplanar CT image reconstructions were also generated. RADIATION DOSE REDUCTION: This exam was performed according to the departmental dose-optimization program which includes automated exposure control, adjustment of the mA and/or kV according to patient size and/or use of iterative reconstruction technique. COMPARISON:  Head CT 05/06/2023, lumbar spine CT 10/21/2022, and CTA chest 10/04/2022. Also, PA and lateral  chest 06/30/2023. FINDINGS: CT HEAD FINDINGS Brain: There is moderately advanced cerebral atrophy with atrophic ventriculomegaly and moderate to severe small vessel disease of the cerebral white matter. There are lacunar infarcts in the right caudate head and external capsules, chronic. Additional chronic tiny lacunar infarcts in the thalami. The cerebellum and brainstem are unremarkable. No cortical based acute infarct, hemorrhage, mass or midline shift are seen. Basal cisterns are clear. Vascular: Patchy calcific plaques both siphons, both distal vertebral arteries. No hyperdense central vessels. Skull: Negative for fractures or focal lesions. Sinuses/orbits: Old lens replacements with otherwise negative orbits. Clear sinuses and mastoids. Other: None. CT THORACIC SPINE FINDINGS Alignment: Mild chronic thoracic spine kyphodextroscoliosis. Trace chronic anterolisthesis T6-7. No traumatic or further listhesis. Vertebrae: Osteopenia. Bridging osteophytes and multilevel interbody ankylosis related to bridging osteophytes again is seen from T4-5 through T10-11, with background advanced spondylosis. There is reactive sclerosis in the vertebral bodies T4, T6, T8 and T10, which was seen previously and unchanged. No primary pathologic lesion or destructive vertebral mass is seen. There is spinous process enthesopathy, with spinous process bridging T8-9 and T10-11. Paraspinal and other soft tissues: The lungs show posterior atelectasis. There is no pleural effusion or pneumothorax in the visualized chest. Small caliber central airways which could be due to tracheobronchomalacia, respiration or bronchospasm. The esophagus is patulous, mildly thickened but no more  than previously. Small to moderate-sized hiatal hernia. The heart is moderately enlarged. There are coronary artery calcifications. Aortic atherosclerosis and uncoiling. Disc levels: The discs are collapsed in the thoracic spine at all levels from T2-3 through  T11-12. At T6-7 there is a tiny central calcified disc extrusion but no resulting mass effect. There is minimal posterior endplate ridging at most levels but this does not significantly encroach on the thecal sac. No herniated discs or cord compromise are suspected. There are facet spurs in the lower third of the thoracic spine, at T10-11 causing moderate left foraminal stenosis, at T11-12 bilateral mild foraminal stenosis. The other foramina appear clear. Other: None. CT LUMBAR SPINE FINDINGS Segmentation: There are 4 lumbar type vertebrae. The lumbosacral junction is defined L4-S1. Alignment: Mild lumbar levoscoliosis apex at L1. Chronic grade 1 anterolisthesis of L4-S1 is unchanged. Minimal grade 1 retrolisthesis L1-2 also chronic. Vertebrae: The prior study demonstrated an acute L1 compression fracture but with only mild loss of vertebral height. This has progressed with now up to 50% anterior and 30% posterior vertebral height loss with increased posterosuperior cortical retropulsion up to 4 mm lateralizing to the left. No acute fracture is seen. Again seen is L3-4 posterior fusion hardware with rods and pedicle screws and laminectomy defect, with interbody metal hardware. The fusion appears solid. There is definitely solid fusion over the facet joints. Generalized osteopenia. There is advanced spondylosis. No focal pathologic lesion is suspected. Reactive endplate sclerosis again noted T11-12 to the left. Paraspinal and other soft tissues: No paraspinal mass, hemorrhage or fluid collection. Heavy aortoiliac calcific plaque. Stable 2 cm right adrenal nodule, Hounsfield density is 30. Status post cholecystectomy. Low pelvic ureteral insertions consistent with pelvic floor laxity. Disc levels: The discs are diffusely degenerated. Retropulsion at T12-L1 mildly effaces the left ventral hemicanal. The foramina are clear. At L1-2, right paracentral disc osteophyte complex and mild facet spurring encroaches on the  right lateral recess without frank spinal stenosis. No foraminal compromise. L2-3 again demonstrating multifactorial severe spinal canal stenosis with facet hypertrophy and disc osteophyte complex. The foramina are moderately stenotic. Stable fusion changes L3-4 with laminectomy. L4-S1 advanced facet hypertrophy and mild to moderate multifactorial spinal canal stenosis. The foramina are clear. The facets are ankylosed. IMPRESSION: 1. No acute intracranial CT findings or depressed skull fractures. 2. Chronic changes. 3. Osteopenia, scoliosis and degenerative change of the thoracic and lumbar spine without evidence of acute fractures. 4. 4 lumbar type vertebrae with the lumbosacral junction defined L4-S1. 5. Progressive L1 compression fracture with now up to 50% anterior and 30% posterior vertebral height loss and increased posterosuperior cortical retropulsion up to 4 mm lateralizing to the left. This now has a chronic appearance. 6. Stable surgical fusion change L3-4. 7. Multifactorial severe spinal canal stenosis at L2-3. 8. Mild to moderate multifactorial spinal canal stenosis at L4-S1. 9. Aortic and coronary artery atherosclerosis. 10. Stable 2 cm right adrenal nodule. 11. Hiatal hernia. 12. Patulous, mildly thickened esophagus. Aortic Atherosclerosis (ICD10-I70.0). Electronically Signed   By: Denman Fischer M.D.   On: 09/09/2023 03:36   DG FEMUR MIN 2 VIEWS LEFT Result Date: 09/09/2023 CLINICAL DATA:  [id number] Closed intertrochanteric fracture, left, initial encounter (HCC) [id number] EXAM: LEFT FEMUR 2 VIEWS COMPARISON:  None Available. FINDINGS: Acute displaced left intertrochanteric femoral fracture. No fracture of the femur more distally. No hip dislocation. No knee dislocation. Severe tricompartmental degenerative changes of the knee. Vascular calcifications. IMPRESSION: 1. Acute displaced left intertrochanteric femoral fracture. 2. No fracture of the femur  more distally. 3. Severe tricompartmental  degenerative changes of the knee. Electronically Signed   By: Morgane  Naveau M.D.   On: 09/09/2023 00:58   DG HIP UNILAT WITH PELVIS 2-3 VIEWS LEFT Result Date: 09/08/2023 CLINICAL DATA:  Recent fall with left hip pain, initial encounter EXAM: DG HIP (WITH OR WITHOUT PELVIS) 3V LEFT COMPARISON:  None Available. FINDINGS: Pelvic ring is intact. Comminuted intratrochanteric left femoral fracture is noted with mild impaction and angulation at the fracture site. No soft tissue abnormality is seen. No other focal abnormality is noted. IMPRESSION: Comminuted left intratrochanteric fracture. Electronically Signed   By: Violeta Grey M.D.   On: 09/08/2023 23:59   DG Chest Port 1 View Result Date: 09/08/2023 CLINICAL DATA:  Recent fall with left hip pain, initial encounter EXAM: PORTABLE CHEST 1 VIEW COMPARISON:  06/30/2023 FINDINGS: Cardiac shadow is prominent but accentuated by the frontal technique. Aortic calcifications are seen. The lungs are clear but hypoinflated. No bony abnormality is noted. IMPRESSION: No active disease. Electronically Signed   By: Violeta Grey M.D.   On: 09/08/2023 23:59      Labs: BNP (last 3 results) Recent Labs    04/14/23 1609 09/08/23 2328  BNP CANCELED 30.6   Basic Metabolic Panel: Recent Labs  Lab 09/10/23 0338 09/11/23 0117 09/12/23 0341 09/13/23 0351 09/14/23 0342  NA 134* 133* 133* 133* 132*  K 4.0 3.7 5.0 4.3 4.1  CL 94* 92* 94* 90* 92*  CO2 30 30 23 29  33*  GLUCOSE 174* 118* 80 105* 110*  BUN 31* 35* 42* 55* 59*  CREATININE 1.54* 1.53* 1.69* 1.86* 1.47*  CALCIUM 8.7* 9.0 8.8* 9.1 8.9  MG  --   --   --   --  2.1   Liver Function Tests: No results for input(s): AST, ALT, ALKPHOS, BILITOT, PROT, ALBUMIN in the last 168 hours. No results for input(s): LIPASE, AMYLASE in the last 168 hours. No results for input(s): AMMONIA in the last 168 hours. CBC: Recent Labs  Lab 09/08/23 2329 09/09/23 0241 09/10/23 0338 09/11/23 0117  09/12/23 0341 09/13/23 0351 09/14/23 0342  WBC 8.6   < > 12.3* 11.0* 9.0 8.1 7.5  NEUTROABS 4.8  --   --   --   --   --   --   HGB 12.0   < > 10.2* 9.4* 10.3* 9.0* 8.1*  HCT 38.8   < > 31.9* 30.1* 34.6* 29.2* 26.7*  MCV 82.7   < > 82.9 83.6 88.9 84.6 84.5  PLT 300   < > 252 244 194 233 248   < > = values in this interval not displayed.   Cardiac Enzymes: No results for input(s): CKTOTAL, CKMB, CKMBINDEX, TROPONINI in the last 168 hours. BNP: Invalid input(s): POCBNP CBG: Recent Labs  Lab 09/13/23 0804 09/13/23 1117 09/13/23 1747 09/13/23 2046 09/14/23 0831  GLUCAP 112* 140* 152* 137* 106*   D-Dimer No results for input(s): DDIMER in the last 72 hours. Hgb A1c No results for input(s): HGBA1C in the last 72 hours. Lipid Profile No results for input(s): CHOL, HDL, LDLCALC, TRIG, CHOLHDL, LDLDIRECT in the last 72 hours. Thyroid  function studies No results for input(s): TSH, T4TOTAL, T3FREE, THYROIDAB in the last 72 hours.  Invalid input(s): FREET3 Anemia work up No results for input(s): VITAMINB12, FOLATE, FERRITIN, TIBC, IRON , RETICCTPCT in the last 72 hours. Urinalysis    Component Value Date/Time   COLORURINE YELLOW 10/22/2022 2106   APPEARANCEUR HAZY (A) 10/22/2022 2106   LABSPEC 1.026  10/22/2022 2106   PHURINE 5.0 10/22/2022 2106   GLUCOSEU NEGATIVE 10/22/2022 2106   HGBUR NEGATIVE 10/22/2022 2106   BILIRUBINUR negative 06/07/2023 1217   KETONESUR NEGATIVE 10/22/2022 2106   PROTEINUR Positive (A) 06/07/2023 1217   PROTEINUR NEGATIVE 10/22/2022 2106   UROBILINOGEN 0.2 06/07/2023 1217   NITRITE negative 06/07/2023 1217   NITRITE NEGATIVE 10/22/2022 2106   LEUKOCYTESUR Trace (A) 06/07/2023 1217   LEUKOCYTESUR NEGATIVE 10/22/2022 2106   Sepsis Labs Recent Labs  Lab 09/11/23 0117 09/12/23 0341 09/13/23 0351 09/14/23 0342  WBC 11.0* 9.0 8.1 7.5   Microbiology Recent Results (from the past 240 hours)   Surgical pcr screen     Status: None   Collection Time: 09/09/23  9:00 AM   Specimen: Nasal Mucosa; Nasal Swab  Result Value Ref Range Status   MRSA, PCR NEGATIVE NEGATIVE Final   Staphylococcus aureus NEGATIVE NEGATIVE Final    Comment: (NOTE) The Xpert SA Assay (FDA approved for NASAL specimens in patients 2 years of age and older), is one component of a comprehensive surveillance program. It is not intended to diagnose infection nor to guide or monitor treatment. Performed at [facility], [address] [place], [place] [address]      Total time spend on discharging this patient, including the last patient exam, discussing the hospital stay, instructions for ongoing care as it relates to all pertinent caregivers, as well as preparing the medical discharge records, prescriptions, and/or referrals as applicable, is 35 minutes.    Garrison Kanner, MD  [facility] 09/14/2023, 8:58 AM

## 2023-09-15 ENCOUNTER — Ambulatory Visit: Admitting: Nurse Practitioner

## 2023-09-15 DIAGNOSIS — I5032 Chronic diastolic (congestive) heart failure: Secondary | ICD-10-CM | POA: Diagnosis not present

## 2023-09-15 DIAGNOSIS — E871 Hypo-osmolality and hyponatremia: Secondary | ICD-10-CM | POA: Diagnosis not present

## 2023-09-15 DIAGNOSIS — I2699 Other pulmonary embolism without acute cor pulmonale: Secondary | ICD-10-CM | POA: Diagnosis not present

## 2023-09-15 DIAGNOSIS — Z7901 Long term (current) use of anticoagulants: Secondary | ICD-10-CM | POA: Diagnosis not present

## 2023-09-15 DIAGNOSIS — R296 Repeated falls: Secondary | ICD-10-CM | POA: Diagnosis not present

## 2023-09-15 DIAGNOSIS — R2689 Other abnormalities of gait and mobility: Secondary | ICD-10-CM | POA: Diagnosis not present

## 2023-09-15 DIAGNOSIS — I110 Hypertensive heart disease with heart failure: Secondary | ICD-10-CM | POA: Diagnosis not present

## 2023-09-15 DIAGNOSIS — M6281 Muscle weakness (generalized): Secondary | ICD-10-CM | POA: Diagnosis not present

## 2023-09-15 DIAGNOSIS — N179 Acute kidney failure, unspecified: Secondary | ICD-10-CM | POA: Diagnosis not present

## 2023-09-15 DIAGNOSIS — S7292XA Unspecified fracture of left femur, initial encounter for closed fracture: Secondary | ICD-10-CM | POA: Diagnosis not present

## 2023-09-15 DIAGNOSIS — N1831 Chronic kidney disease, stage 3a: Secondary | ICD-10-CM | POA: Diagnosis not present

## 2023-09-15 DIAGNOSIS — E119 Type 2 diabetes mellitus without complications: Secondary | ICD-10-CM | POA: Diagnosis not present

## 2023-09-15 DIAGNOSIS — S72142D Displaced intertrochanteric fracture of left femur, subsequent encounter for closed fracture with routine healing: Secondary | ICD-10-CM | POA: Diagnosis not present

## 2023-09-18 DIAGNOSIS — E1122 Type 2 diabetes mellitus with diabetic chronic kidney disease: Secondary | ICD-10-CM | POA: Diagnosis not present

## 2023-09-18 DIAGNOSIS — I10 Essential (primary) hypertension: Secondary | ICD-10-CM | POA: Diagnosis not present

## 2023-09-18 DIAGNOSIS — N1831 Chronic kidney disease, stage 3a: Secondary | ICD-10-CM | POA: Diagnosis not present

## 2023-09-18 DIAGNOSIS — I2699 Other pulmonary embolism without acute cor pulmonale: Secondary | ICD-10-CM | POA: Diagnosis not present

## 2023-09-18 DIAGNOSIS — S72002A Fracture of unspecified part of neck of left femur, initial encounter for closed fracture: Secondary | ICD-10-CM | POA: Diagnosis not present

## 2023-09-18 DIAGNOSIS — E871 Hypo-osmolality and hyponatremia: Secondary | ICD-10-CM | POA: Diagnosis not present

## 2023-09-18 DIAGNOSIS — I5032 Chronic diastolic (congestive) heart failure: Secondary | ICD-10-CM | POA: Diagnosis not present

## 2023-09-19 DIAGNOSIS — R296 Repeated falls: Secondary | ICD-10-CM | POA: Diagnosis not present

## 2023-09-19 DIAGNOSIS — M6281 Muscle weakness (generalized): Secondary | ICD-10-CM | POA: Diagnosis not present

## 2023-09-19 DIAGNOSIS — S72002A Fracture of unspecified part of neck of left femur, initial encounter for closed fracture: Secondary | ICD-10-CM | POA: Diagnosis not present

## 2023-09-19 DIAGNOSIS — I2699 Other pulmonary embolism without acute cor pulmonale: Secondary | ICD-10-CM | POA: Diagnosis not present

## 2023-09-19 DIAGNOSIS — E1122 Type 2 diabetes mellitus with diabetic chronic kidney disease: Secondary | ICD-10-CM | POA: Diagnosis not present

## 2023-09-19 DIAGNOSIS — N1831 Chronic kidney disease, stage 3a: Secondary | ICD-10-CM | POA: Diagnosis not present

## 2023-09-19 DIAGNOSIS — E871 Hypo-osmolality and hyponatremia: Secondary | ICD-10-CM | POA: Diagnosis not present

## 2023-09-19 DIAGNOSIS — R2689 Other abnormalities of gait and mobility: Secondary | ICD-10-CM | POA: Diagnosis not present

## 2023-09-19 DIAGNOSIS — I5032 Chronic diastolic (congestive) heart failure: Secondary | ICD-10-CM | POA: Diagnosis not present

## 2023-09-19 DIAGNOSIS — N179 Acute kidney failure, unspecified: Secondary | ICD-10-CM | POA: Diagnosis not present

## 2023-09-19 DIAGNOSIS — I10 Essential (primary) hypertension: Secondary | ICD-10-CM | POA: Diagnosis not present

## 2023-09-19 DIAGNOSIS — S72142D Displaced intertrochanteric fracture of left femur, subsequent encounter for closed fracture with routine healing: Secondary | ICD-10-CM | POA: Diagnosis not present

## 2023-09-20 DIAGNOSIS — I129 Hypertensive chronic kidney disease with stage 1 through stage 4 chronic kidney disease, or unspecified chronic kidney disease: Secondary | ICD-10-CM | POA: Diagnosis not present

## 2023-09-20 DIAGNOSIS — I5030 Unspecified diastolic (congestive) heart failure: Secondary | ICD-10-CM | POA: Diagnosis not present

## 2023-09-20 DIAGNOSIS — I2699 Other pulmonary embolism without acute cor pulmonale: Secondary | ICD-10-CM | POA: Diagnosis not present

## 2023-09-20 DIAGNOSIS — S72002A Fracture of unspecified part of neck of left femur, initial encounter for closed fracture: Secondary | ICD-10-CM | POA: Diagnosis not present

## 2023-09-20 DIAGNOSIS — E871 Hypo-osmolality and hyponatremia: Secondary | ICD-10-CM | POA: Diagnosis not present

## 2023-09-21 ENCOUNTER — Inpatient Hospital Stay: Admitting: Nurse Practitioner

## 2023-09-21 NOTE — Progress Notes (Deleted)
   Acute Office Visit  Subjective:     Patient ID: Stacy Kirk, female    DOB: Jan 20, 1946, 78 y.o.   MRN: [id number]  No chief complaint on file.   HPI Patient is in today for hospital follow-up  Patient was seen in the emergency department on 09/09/2023.  Patient was brought in for EMS evaluation of pain in the left hip after a fall.  She was found to have a left intra-articular enteric fracture orthopedic was consulted.  She is admitted to take to the OR the following day.  Patient was discharged on 09/14/2023.  I recommended PT/OT and a SNF for rehab.  Weightbearing as tolerated she is post follow-up with orthopedist in 2 weeks.  She did have a AKI and low normal blood pressure I did hold patient's torsemide  and Aldactone  but continue metoprolol .  Patient is here for follow-up  ROS      Objective:    There were no vitals taken for this visit. {Vitals History (Optional):23777}  Physical Exam  No results found for any visits on 09/21/23.      Assessment & Plan:   Problem List Items Addressed This Visit   None   No orders of the defined types were placed in this encounter.   No follow-ups on file.  Adina Crandall, NP

## 2023-09-22 ENCOUNTER — Ambulatory Visit: Admitting: Nurse Practitioner

## 2023-09-22 DIAGNOSIS — I129 Hypertensive chronic kidney disease with stage 1 through stage 4 chronic kidney disease, or unspecified chronic kidney disease: Secondary | ICD-10-CM | POA: Diagnosis not present

## 2023-09-22 DIAGNOSIS — I2699 Other pulmonary embolism without acute cor pulmonale: Secondary | ICD-10-CM | POA: Diagnosis not present

## 2023-09-22 DIAGNOSIS — I5030 Unspecified diastolic (congestive) heart failure: Secondary | ICD-10-CM | POA: Diagnosis not present

## 2023-09-22 DIAGNOSIS — S72002A Fracture of unspecified part of neck of left femur, initial encounter for closed fracture: Secondary | ICD-10-CM | POA: Diagnosis not present

## 2023-09-22 DIAGNOSIS — E871 Hypo-osmolality and hyponatremia: Secondary | ICD-10-CM | POA: Diagnosis not present

## 2023-09-25 DIAGNOSIS — I10 Essential (primary) hypertension: Secondary | ICD-10-CM | POA: Diagnosis not present

## 2023-09-25 DIAGNOSIS — Z86711 Personal history of pulmonary embolism: Secondary | ICD-10-CM | POA: Diagnosis not present

## 2023-09-25 DIAGNOSIS — Z7901 Long term (current) use of anticoagulants: Secondary | ICD-10-CM | POA: Diagnosis not present

## 2023-09-25 DIAGNOSIS — S7292XA Unspecified fracture of left femur, initial encounter for closed fracture: Secondary | ICD-10-CM | POA: Diagnosis not present

## 2023-09-25 DIAGNOSIS — E871 Hypo-osmolality and hyponatremia: Secondary | ICD-10-CM | POA: Diagnosis not present

## 2023-09-25 DIAGNOSIS — I5030 Unspecified diastolic (congestive) heart failure: Secondary | ICD-10-CM | POA: Diagnosis not present

## 2023-09-25 DIAGNOSIS — N1831 Chronic kidney disease, stage 3a: Secondary | ICD-10-CM | POA: Diagnosis not present

## 2023-09-26 DIAGNOSIS — I5032 Chronic diastolic (congestive) heart failure: Secondary | ICD-10-CM | POA: Diagnosis not present

## 2023-09-26 DIAGNOSIS — R296 Repeated falls: Secondary | ICD-10-CM | POA: Diagnosis not present

## 2023-09-26 DIAGNOSIS — S72142D Displaced intertrochanteric fracture of left femur, subsequent encounter for closed fracture with routine healing: Secondary | ICD-10-CM | POA: Diagnosis not present

## 2023-09-26 DIAGNOSIS — R2689 Other abnormalities of gait and mobility: Secondary | ICD-10-CM | POA: Diagnosis not present

## 2023-09-26 DIAGNOSIS — M6281 Muscle weakness (generalized): Secondary | ICD-10-CM | POA: Diagnosis not present

## 2023-09-27 DIAGNOSIS — S72002A Fracture of unspecified part of neck of left femur, initial encounter for closed fracture: Secondary | ICD-10-CM | POA: Diagnosis not present

## 2023-09-28 DIAGNOSIS — I1310 Hypertensive heart and chronic kidney disease without heart failure, with stage 1 through stage 4 chronic kidney disease, or unspecified chronic kidney disease: Secondary | ICD-10-CM | POA: Diagnosis not present

## 2023-09-28 DIAGNOSIS — E871 Hypo-osmolality and hyponatremia: Secondary | ICD-10-CM | POA: Diagnosis not present

## 2023-09-28 DIAGNOSIS — I2699 Other pulmonary embolism without acute cor pulmonale: Secondary | ICD-10-CM | POA: Diagnosis not present

## 2023-09-28 DIAGNOSIS — Z7901 Long term (current) use of anticoagulants: Secondary | ICD-10-CM | POA: Diagnosis not present

## 2023-09-28 DIAGNOSIS — I5030 Unspecified diastolic (congestive) heart failure: Secondary | ICD-10-CM | POA: Diagnosis not present

## 2023-09-28 DIAGNOSIS — S72302D Unspecified fracture of shaft of left femur, subsequent encounter for closed fracture with routine healing: Secondary | ICD-10-CM | POA: Diagnosis not present

## 2023-10-03 ENCOUNTER — Telehealth: Payer: Self-pay | Admitting: Nurse Practitioner

## 2023-10-03 DIAGNOSIS — I5032 Chronic diastolic (congestive) heart failure: Secondary | ICD-10-CM | POA: Diagnosis not present

## 2023-10-03 DIAGNOSIS — S91302A Unspecified open wound, left foot, initial encounter: Secondary | ICD-10-CM | POA: Diagnosis not present

## 2023-10-03 DIAGNOSIS — S72142D Displaced intertrochanteric fracture of left femur, subsequent encounter for closed fracture with routine healing: Secondary | ICD-10-CM | POA: Diagnosis not present

## 2023-10-03 DIAGNOSIS — R2689 Other abnormalities of gait and mobility: Secondary | ICD-10-CM | POA: Diagnosis not present

## 2023-10-03 DIAGNOSIS — M6281 Muscle weakness (generalized): Secondary | ICD-10-CM | POA: Diagnosis not present

## 2023-10-03 DIAGNOSIS — R296 Repeated falls: Secondary | ICD-10-CM | POA: Diagnosis not present

## 2023-10-03 NOTE — Telephone Encounter (Signed)
 Traditionally there are providers at the rehab centers that do there own referrals and orders. She would need to be seen by me for a referral

## 2023-10-03 NOTE — Telephone Encounter (Signed)
  Copied from CRM [id number]. Topic: Clinical - Medication Question >> Oct 03, 2023 10:16 AM Aleatha C wrote: Reason for CRM: patient spouse called because patient foot is in bad shape and the rehabilitation center  advise that she gets orthopedic surgery for it

## 2023-10-03 NOTE — Telephone Encounter (Signed)
 Called pt and spoke with Stacy Kirk.  Pt Is scheduled for 7/11 @ 4pm  Stacy Kirk states that he has to contact transportation services at [facility] to take pt to appointment.  Advised Stacy Kirk to reach out after speaking with them so we know to either reschedule or keep the same appt date.

## 2023-10-06 ENCOUNTER — Ambulatory Visit: Admitting: Nurse Practitioner

## 2023-10-06 ENCOUNTER — Encounter: Admitting: Family

## 2023-10-06 ENCOUNTER — Ambulatory Visit: Admitting: Student

## 2023-10-06 DIAGNOSIS — I5032 Chronic diastolic (congestive) heart failure: Secondary | ICD-10-CM | POA: Diagnosis not present

## 2023-10-06 DIAGNOSIS — Z86711 Personal history of pulmonary embolism: Secondary | ICD-10-CM | POA: Diagnosis not present

## 2023-10-06 DIAGNOSIS — R2689 Other abnormalities of gait and mobility: Secondary | ICD-10-CM | POA: Diagnosis not present

## 2023-10-06 DIAGNOSIS — Z5189 Encounter for other specified aftercare: Secondary | ICD-10-CM | POA: Diagnosis not present

## 2023-10-06 DIAGNOSIS — R296 Repeated falls: Secondary | ICD-10-CM | POA: Diagnosis not present

## 2023-10-06 DIAGNOSIS — S91302A Unspecified open wound, left foot, initial encounter: Secondary | ICD-10-CM | POA: Diagnosis not present

## 2023-10-06 DIAGNOSIS — M6281 Muscle weakness (generalized): Secondary | ICD-10-CM | POA: Diagnosis not present

## 2023-10-06 DIAGNOSIS — N1831 Chronic kidney disease, stage 3a: Secondary | ICD-10-CM | POA: Diagnosis not present

## 2023-10-06 DIAGNOSIS — S72302D Unspecified fracture of shaft of left femur, subsequent encounter for closed fracture with routine healing: Secondary | ICD-10-CM | POA: Diagnosis not present

## 2023-10-06 DIAGNOSIS — I10 Essential (primary) hypertension: Secondary | ICD-10-CM | POA: Diagnosis not present

## 2023-10-06 DIAGNOSIS — S72142D Displaced intertrochanteric fracture of left femur, subsequent encounter for closed fracture with routine healing: Secondary | ICD-10-CM | POA: Diagnosis not present

## 2023-10-10 DIAGNOSIS — R2689 Other abnormalities of gait and mobility: Secondary | ICD-10-CM | POA: Diagnosis not present

## 2023-10-10 DIAGNOSIS — R296 Repeated falls: Secondary | ICD-10-CM | POA: Diagnosis not present

## 2023-10-10 DIAGNOSIS — I5032 Chronic diastolic (congestive) heart failure: Secondary | ICD-10-CM | POA: Diagnosis not present

## 2023-10-10 DIAGNOSIS — S72142D Displaced intertrochanteric fracture of left femur, subsequent encounter for closed fracture with routine healing: Secondary | ICD-10-CM | POA: Diagnosis not present

## 2023-10-10 DIAGNOSIS — S91302A Unspecified open wound, left foot, initial encounter: Secondary | ICD-10-CM | POA: Diagnosis not present

## 2023-10-10 DIAGNOSIS — M6281 Muscle weakness (generalized): Secondary | ICD-10-CM | POA: Diagnosis not present

## 2023-10-11 DIAGNOSIS — S91302A Unspecified open wound, left foot, initial encounter: Secondary | ICD-10-CM | POA: Diagnosis not present

## 2023-10-11 DIAGNOSIS — S7292XA Unspecified fracture of left femur, initial encounter for closed fracture: Secondary | ICD-10-CM | POA: Diagnosis not present

## 2023-10-11 DIAGNOSIS — I130 Hypertensive heart and chronic kidney disease with heart failure and stage 1 through stage 4 chronic kidney disease, or unspecified chronic kidney disease: Secondary | ICD-10-CM | POA: Diagnosis not present

## 2023-10-11 DIAGNOSIS — Z96642 Presence of left artificial hip joint: Secondary | ICD-10-CM | POA: Diagnosis not present

## 2023-10-12 ENCOUNTER — Other Ambulatory Visit: Payer: Self-pay | Admitting: Nurse Practitioner

## 2023-10-12 DIAGNOSIS — R0982 Postnasal drip: Secondary | ICD-10-CM

## 2023-10-13 ENCOUNTER — Telehealth: Payer: Self-pay

## 2023-10-13 NOTE — Transitions of Care (Post Inpatient/ED Visit) (Signed)
   10/13/2023  Name: Stacy Kirk MRN: [id number] DOB: 1945/11/29  Today's TOC FU Call Status: Today's TOC FU Call Status:: Unsuccessful Call (1st Attempt) Unsuccessful Call (1st Attempt) Date: 10/13/23  Attempted to reach the patient regarding the most recent Inpatient/ED visit.  Follow Up Plan: Additional outreach attempts will be made to reach the patient to complete the Transitions of Care (Post Inpatient/ED visit) call.   Signature Julian Lemmings, LPN [facility] Nurse Health Advisor Direct Dial [phone]

## 2023-10-15 ENCOUNTER — Other Ambulatory Visit: Payer: Self-pay | Admitting: Nurse Practitioner

## 2023-10-15 DIAGNOSIS — K219 Gastro-esophageal reflux disease without esophagitis: Secondary | ICD-10-CM | POA: Diagnosis not present

## 2023-10-15 DIAGNOSIS — L89626 Pressure-induced deep tissue damage of left heel: Secondary | ICD-10-CM | POA: Diagnosis not present

## 2023-10-15 DIAGNOSIS — E039 Hypothyroidism, unspecified: Secondary | ICD-10-CM | POA: Diagnosis not present

## 2023-10-15 DIAGNOSIS — S72142D Displaced intertrochanteric fracture of left femur, subsequent encounter for closed fracture with routine healing: Secondary | ICD-10-CM | POA: Diagnosis not present

## 2023-10-15 DIAGNOSIS — I890 Lymphedema, not elsewhere classified: Secondary | ICD-10-CM | POA: Diagnosis not present

## 2023-10-15 DIAGNOSIS — N1831 Chronic kidney disease, stage 3a: Secondary | ICD-10-CM | POA: Diagnosis not present

## 2023-10-15 DIAGNOSIS — E1122 Type 2 diabetes mellitus with diabetic chronic kidney disease: Secondary | ICD-10-CM | POA: Diagnosis not present

## 2023-10-15 DIAGNOSIS — I130 Hypertensive heart and chronic kidney disease with heart failure and stage 1 through stage 4 chronic kidney disease, or unspecified chronic kidney disease: Secondary | ICD-10-CM | POA: Diagnosis not present

## 2023-10-15 DIAGNOSIS — I5032 Chronic diastolic (congestive) heart failure: Secondary | ICD-10-CM | POA: Diagnosis not present

## 2023-10-16 ENCOUNTER — Telehealth: Payer: Self-pay

## 2023-10-16 ENCOUNTER — Other Ambulatory Visit: Payer: Self-pay | Admitting: Cardiology

## 2023-10-16 NOTE — Transitions of Care (Post Inpatient/ED Visit) (Unsigned)
   10/16/2023  Name: Stacy Kirk MRN: [id number] DOB: 11-09-1945  Today's TOC FU Call Status: Today's TOC FU Call Status:: Unsuccessful Call (2nd Attempt) Unsuccessful Call (1st Attempt) Date: 10/13/23 Unsuccessful Call (2nd Attempt) Date: 10/16/23  Attempted to reach the patient regarding the most recent Inpatient/ED visit.  Follow Up Plan: Additional outreach attempts will be made to reach the patient to complete the Transitions of Care (Post Inpatient/ED visit) call.   Signature Julian Lemmings, LPN [facility] Nurse Health Advisor Direct Dial [phone]

## 2023-10-16 NOTE — Telephone Encounter (Signed)
Verbal orders approved.

## 2023-10-16 NOTE — Telephone Encounter (Signed)
 Copied from CRM [id number]. Topic: Clinical - Home Health Verbal Orders >> Oct 16, 2023 11:22 AM Lavanda D wrote: Caller/Agency: Pamela/RN [facility] Callback Number: [phone] (secure line) Service Requested: Skilled Nursing Frequency: 1x a week for 9 weeks, 2 PRN's for aftercare ulcer on heel. Fracture of of left hip.  Any new concerns about the patient? No, she is currently bedbound. Patients husband has reported that she has not been eating well.

## 2023-10-16 NOTE — Telephone Encounter (Signed)
 Contacted pamela with [facility].  Relayed information per PCP of the approved verbal orders.  No questions or concerns at this time.

## 2023-10-17 DIAGNOSIS — I130 Hypertensive heart and chronic kidney disease with heart failure and stage 1 through stage 4 chronic kidney disease, or unspecified chronic kidney disease: Secondary | ICD-10-CM | POA: Diagnosis not present

## 2023-10-17 DIAGNOSIS — I5032 Chronic diastolic (congestive) heart failure: Secondary | ICD-10-CM | POA: Diagnosis not present

## 2023-10-17 DIAGNOSIS — E039 Hypothyroidism, unspecified: Secondary | ICD-10-CM | POA: Diagnosis not present

## 2023-10-17 DIAGNOSIS — K219 Gastro-esophageal reflux disease without esophagitis: Secondary | ICD-10-CM | POA: Diagnosis not present

## 2023-10-17 DIAGNOSIS — E1122 Type 2 diabetes mellitus with diabetic chronic kidney disease: Secondary | ICD-10-CM | POA: Diagnosis not present

## 2023-10-17 DIAGNOSIS — S72142D Displaced intertrochanteric fracture of left femur, subsequent encounter for closed fracture with routine healing: Secondary | ICD-10-CM | POA: Diagnosis not present

## 2023-10-17 DIAGNOSIS — I890 Lymphedema, not elsewhere classified: Secondary | ICD-10-CM | POA: Diagnosis not present

## 2023-10-17 DIAGNOSIS — N1831 Chronic kidney disease, stage 3a: Secondary | ICD-10-CM | POA: Diagnosis not present

## 2023-10-17 DIAGNOSIS — L89626 Pressure-induced deep tissue damage of left heel: Secondary | ICD-10-CM | POA: Diagnosis not present

## 2023-10-17 NOTE — Transitions of Care (Post Inpatient/ED Visit) (Signed)
 10/17/2023  Name: Stacy Kirk MRN: [id number] DOB: 1945-10-19  Today's TOC FU Call Status: Today's TOC FU Call Status:: Successful TOC FU Call Completed Unsuccessful Call (1st Attempt) Date: 10/13/23 Unsuccessful Call (2nd Attempt) Date: 10/16/23 [facility] FU Call Complete Date: 10/17/23 Patient's Name and Date of Birth confirmed.  Transition Care Management Follow-up Telephone Call Date of Discharge: 10/12/23 Discharge Facility: Other [facility]) Name of Other (Non[facility]) Discharge Facility: [facility] Type of Discharge: Inpatient Admission Primary Inpatient Discharge Diagnosis:: fracture of left femur How have you been since you were released from the hospital?: Better Any questions or concerns?: No  Items Reviewed: Did you receive and understand the discharge instructions provided?: Yes Medications obtained,verified, and reconciled?: Yes (Medications Reviewed) Any new allergies since your discharge?: No Dietary orders reviewed?: Yes Do you have support at home?: Yes People in Home [RPT]: spouse  Medications Reviewed Today: Medications Reviewed Today     Reviewed by Emmitt Pan, LPN (Licensed Practical Nurse) on 10/17/23 at 1335  Med List Status: <None>   Medication Order Taking? Sig Documenting Provider Last Dose Status Informant  acetaminophen  (TYLENOL ) 500 MG tablet [id number] Yes Take 1,000 mg by mouth every 6 (six) hours as needed (FOR PAIN.). [provider]  Active Spouse/Significant Other, Pharmacy Records  cetirizine  (ZYRTEC ) 10 MG tablet [id number] Yes Take 1 tablet (10 mg total) by mouth daily. Wendee Lynwood HERO, NP  Active Spouse/Significant Other, Pharmacy Records  ELIQUIS  5 MG TABS tablet [id number] Yes TAKE 1 TABLET BY MOUTH TWICE A DAY Wendee Lynwood HERO, NP  Active Spouse/Significant Other, Pharmacy Records  ferrous sulfate  325 (65 FE) MG tablet [id number] Yes TAKE 325 MG BY MOUTH DAILY. Wendee Lynwood HERO, NP  Active Spouse/Significant Other,  Pharmacy Records  fluticasone  (FLONASE ) 50 MCG/ACT nasal spray [id number] Yes SPRAY 2 SPRAYS INTO EACH NOSTRIL EVERY DAY Wendee Lynwood HERO, NP  Active   HYDROcodone -acetaminophen  (NORCO/VICODIN) 5-325 MG tablet [id number] Yes Take 1 tablet by mouth every 6 (six) hours as needed for moderate pain (pain score 4-6). Charlene Debby BROCKS, PA-C  Active   lactose free nutrition (BOOST PLUS) LIQD [id number] Yes Take 237 mLs by mouth 2 (two) times daily between meals. Awanda City, MD  Active   levothyroxine  (SYNTHROID ) 100 MCG tablet [id number] Yes TAKE 1 TABLET BY MOUTH EVERY DAY Wendee Lynwood HERO, NP  Active Spouse/Significant Other, Pharmacy Records  lidocaine  (LIDODERM ) 5 % [id number] Yes Place 1-2 patches onto the skin at bedtime. Remove & Discard patch within 12 hours or as directed by MD Awanda City, MD  Active   metFORMIN  (GLUCOPHAGE ) 500 MG tablet [id number] Yes TAKE 1 TABLET (500 MG TOTAL) BY MOUTH DAILY AFTER SUPPER. Wendee Lynwood HERO, NP  Active Spouse/Significant Other, Pharmacy Records  methocarbamol  (ROBAXIN ) 500 MG tablet [id number] Yes Take 1 tablet (500 mg total) by mouth every 8 (eight) hours as needed for muscle spasms. Awanda City, MD  Active   metoprolol  succinate (TOPROL -XL) 25 MG 24 hr tablet [id number] Yes Take 1 tablet (25 mg total) by mouth daily. Wendee Lynwood HERO, NP  Active Spouse/Significant Other, Pharmacy Records  multivitamin (RENA-VIT) TABS tablet [id number] Yes Take 1 tablet by mouth at bedtime. Awanda City, MD  Active   pantoprazole  (PROTONIX ) 40 MG tablet [id number] Yes TAKE 1 TABLET BY MOUTH EVERY DAY Wendee Lynwood HERO, NP  Active Spouse/Significant Other, Pharmacy Records  spironolactone  (ALDACTONE ) 25 MG tablet [id number]  TAKE 1 TABLET (25 MG TOTAL) BY MOUTH DAILY.  Patient not taking: Reported on 10/17/2023  Wendee Lynwood HERO, NP  Active Spouse/Significant Other, Pharmacy Records  torsemide  (DEMADEX ) 20 MG tablet [id number]  Take 2 tablets (40 mg total) by mouth daily.  Patient not taking: Reported on  10/17/2023   Darliss Rogue, MD  Active Spouse/Significant Other, Pharmacy Records  Med List Note Rayne Arley HERO Bishop 11/22/22 1427): Iglehart,[name]  Spouse   [phone]            Home Care and Equipment/Supplies: Were Home Health Services Ordered?: Yes Name of Home Health Agency:: [facility] Has Agency set up a time to come to your home?: Yes First Home Health Visit Date: 10/17/23 Any new equipment or medical supplies ordered?: Yes Name of Medical supply agency?: unknown Were you able to get the equipment/medical supplies?: Yes Do you have any questions related to the use of the equipment/supplies?: No  Functional Questionnaire: Do you need assistance with bathing/showering or dressing?: Yes Do you need assistance with meal preparation?: Yes Do you need assistance with eating?: No Do you have difficulty maintaining continence: No Do you need assistance with getting out of bed/getting out of a chair/moving?: Yes Do you have difficulty managing or taking your medications?: No  Follow up appointments reviewed: PCP Follow-up appointment confirmed?: Yes Date of PCP follow-up appointment?: 11/02/23 (appt was already schedule and they don't wish to change it) Follow-up Provider: [facility] Follow-up appointment confirmed?: NA Do you need transportation to your follow-up appointment?: No Do you understand care options if your condition(s) worsen?: Yes-patient verbalized understanding    SIGNATURE Julian Lemmings, LPN [facility] Nurse Health Advisor Direct Dial [phone]

## 2023-10-17 NOTE — Telephone Encounter (Signed)
 Pt's pharmacy is requesting a refill on medication torsemide . This medication is on pause. Would Dr. Darliss like for pt to resume this medication ? Please address

## 2023-10-18 ENCOUNTER — Telehealth: Payer: Self-pay

## 2023-10-18 NOTE — Telephone Encounter (Signed)
 Copied from CRM [id number]. Topic: Clinical - Home Health Verbal Orders >> Oct 18, 2023  3:19 PM Deaijah H wrote: Caller/Agency: Mark PT [facility] Callback Number: [phone] (secured line) Service Requested: Physical Therapy Frequency: 2x a week for 1 wk / 1x a wk for 1 wk / 2x a week for 6wks Any new concerns about the patient? No

## 2023-10-18 NOTE — Telephone Encounter (Signed)
 Copied from CRM [id number]. Topic: Clinical - Medication Question >> Oct 18, 2023 11:20 AM Henretta I wrote: Reason for CRM: Patient's husband would like a call to discuss HYDROcodone -acetaminophen  (NORCO/VICODIN) 5-325 MG tablet and to make sure it's okay for wife to take.

## 2023-10-18 NOTE — Telephone Encounter (Signed)
 Can we gather more information. As in what is he worried about with her taking the medication?

## 2023-10-19 ENCOUNTER — Inpatient Hospital Stay
Admission: EM | Admit: 2023-10-19 | Discharge: 2023-10-27 | DRG: 189 | Disposition: A | Discharge status: Home Health | Attending: Hospitalist | Admitting: Hospitalist

## 2023-10-19 ENCOUNTER — Encounter: Payer: Self-pay | Admitting: Emergency Medicine

## 2023-10-19 ENCOUNTER — Ambulatory Visit: Payer: Self-pay

## 2023-10-19 ENCOUNTER — Emergency Department

## 2023-10-19 ENCOUNTER — Other Ambulatory Visit: Payer: Self-pay

## 2023-10-19 DIAGNOSIS — E039 Hypothyroidism, unspecified: Secondary | ICD-10-CM | POA: Diagnosis not present

## 2023-10-19 DIAGNOSIS — R5381 Other malaise: Secondary | ICD-10-CM | POA: Diagnosis present

## 2023-10-19 DIAGNOSIS — I959 Hypotension, unspecified: Secondary | ICD-10-CM | POA: Diagnosis not present

## 2023-10-19 DIAGNOSIS — R338 Other retention of urine: Secondary | ICD-10-CM | POA: Diagnosis present

## 2023-10-19 DIAGNOSIS — I10 Essential (primary) hypertension: Secondary | ICD-10-CM | POA: Diagnosis not present

## 2023-10-19 DIAGNOSIS — I4891 Unspecified atrial fibrillation: Secondary | ICD-10-CM | POA: Diagnosis present

## 2023-10-19 DIAGNOSIS — E1165 Type 2 diabetes mellitus with hyperglycemia: Secondary | ICD-10-CM | POA: Diagnosis present

## 2023-10-19 DIAGNOSIS — Z789 Other specified health status: Secondary | ICD-10-CM | POA: Diagnosis not present

## 2023-10-19 DIAGNOSIS — M25372 Other instability, left ankle: Secondary | ICD-10-CM | POA: Diagnosis not present

## 2023-10-19 DIAGNOSIS — F0390 Unspecified dementia without behavioral disturbance: Secondary | ICD-10-CM | POA: Diagnosis present

## 2023-10-19 DIAGNOSIS — B9620 Unspecified Escherichia coli [E. coli] as the cause of diseases classified elsewhere: Secondary | ICD-10-CM | POA: Diagnosis present

## 2023-10-19 DIAGNOSIS — W44F9XA Other object of natural or organic material, entering into or through a natural orifice, initial encounter: Secondary | ICD-10-CM | POA: Diagnosis present

## 2023-10-19 DIAGNOSIS — Z66 Do not resuscitate: Secondary | ICD-10-CM | POA: Diagnosis not present

## 2023-10-19 DIAGNOSIS — M79672 Pain in left foot: Secondary | ICD-10-CM | POA: Diagnosis not present

## 2023-10-19 DIAGNOSIS — N1831 Chronic kidney disease, stage 3a: Secondary | ICD-10-CM | POA: Diagnosis not present

## 2023-10-19 DIAGNOSIS — Z1152 Encounter for screening for COVID-19: Secondary | ICD-10-CM | POA: Diagnosis not present

## 2023-10-19 DIAGNOSIS — D509 Iron deficiency anemia, unspecified: Secondary | ICD-10-CM | POA: Diagnosis present

## 2023-10-19 DIAGNOSIS — R0602 Shortness of breath: Secondary | ICD-10-CM | POA: Diagnosis not present

## 2023-10-19 DIAGNOSIS — Z6834 Body mass index (BMI) 34.0-34.9, adult: Secondary | ICD-10-CM

## 2023-10-19 DIAGNOSIS — Z974 Presence of external hearing-aid: Secondary | ICD-10-CM

## 2023-10-19 DIAGNOSIS — E1122 Type 2 diabetes mellitus with diabetic chronic kidney disease: Secondary | ICD-10-CM | POA: Diagnosis present

## 2023-10-19 DIAGNOSIS — E8729 Other acidosis: Secondary | ICD-10-CM | POA: Diagnosis present

## 2023-10-19 DIAGNOSIS — R627 Adult failure to thrive: Secondary | ICD-10-CM | POA: Diagnosis present

## 2023-10-19 DIAGNOSIS — R4182 Altered mental status, unspecified: Secondary | ICD-10-CM | POA: Diagnosis not present

## 2023-10-19 DIAGNOSIS — R410 Disorientation, unspecified: Secondary | ICD-10-CM | POA: Diagnosis not present

## 2023-10-19 DIAGNOSIS — E669 Obesity, unspecified: Secondary | ICD-10-CM | POA: Diagnosis present

## 2023-10-19 DIAGNOSIS — G9341 Metabolic encephalopathy: Secondary | ICD-10-CM | POA: Diagnosis not present

## 2023-10-19 DIAGNOSIS — Z79890 Hormone replacement therapy (postmenopausal): Secondary | ICD-10-CM

## 2023-10-19 DIAGNOSIS — Z85828 Personal history of other malignant neoplasm of skin: Secondary | ICD-10-CM

## 2023-10-19 DIAGNOSIS — J441 Chronic obstructive pulmonary disease with (acute) exacerbation: Secondary | ICD-10-CM | POA: Diagnosis not present

## 2023-10-19 DIAGNOSIS — K5909 Other constipation: Secondary | ICD-10-CM | POA: Diagnosis not present

## 2023-10-19 DIAGNOSIS — I2699 Other pulmonary embolism without acute cor pulmonale: Secondary | ICD-10-CM | POA: Diagnosis present

## 2023-10-19 DIAGNOSIS — Z79899 Other long term (current) drug therapy: Secondary | ICD-10-CM

## 2023-10-19 DIAGNOSIS — R000 Tachycardia, unspecified: Secondary | ICD-10-CM | POA: Diagnosis not present

## 2023-10-19 DIAGNOSIS — E1129 Type 2 diabetes mellitus with other diabetic kidney complication: Secondary | ICD-10-CM | POA: Diagnosis present

## 2023-10-19 DIAGNOSIS — Z7189 Other specified counseling: Secondary | ICD-10-CM | POA: Diagnosis not present

## 2023-10-19 DIAGNOSIS — Z743 Need for continuous supervision: Secondary | ICD-10-CM | POA: Diagnosis not present

## 2023-10-19 DIAGNOSIS — J9811 Atelectasis: Secondary | ICD-10-CM | POA: Diagnosis not present

## 2023-10-19 DIAGNOSIS — Z86711 Personal history of pulmonary embolism: Secondary | ICD-10-CM

## 2023-10-19 DIAGNOSIS — I672 Cerebral atherosclerosis: Secondary | ICD-10-CM | POA: Diagnosis not present

## 2023-10-19 DIAGNOSIS — J690 Pneumonitis due to inhalation of food and vomit: Secondary | ICD-10-CM | POA: Diagnosis not present

## 2023-10-19 DIAGNOSIS — I130 Hypertensive heart and chronic kidney disease with heart failure and stage 1 through stage 4 chronic kidney disease, or unspecified chronic kidney disease: Secondary | ICD-10-CM | POA: Diagnosis present

## 2023-10-19 DIAGNOSIS — G928 Other toxic encephalopathy: Secondary | ICD-10-CM | POA: Diagnosis not present

## 2023-10-19 DIAGNOSIS — J984 Other disorders of lung: Secondary | ICD-10-CM | POA: Diagnosis not present

## 2023-10-19 DIAGNOSIS — S72142A Displaced intertrochanteric fracture of left femur, initial encounter for closed fracture: Secondary | ICD-10-CM | POA: Diagnosis not present

## 2023-10-19 DIAGNOSIS — Z7401 Bed confinement status: Secondary | ICD-10-CM

## 2023-10-19 DIAGNOSIS — Z7901 Long term (current) use of anticoagulants: Secondary | ICD-10-CM

## 2023-10-19 DIAGNOSIS — M129 Arthropathy, unspecified: Secondary | ICD-10-CM | POA: Diagnosis not present

## 2023-10-19 DIAGNOSIS — N3000 Acute cystitis without hematuria: Secondary | ICD-10-CM | POA: Diagnosis not present

## 2023-10-19 DIAGNOSIS — J9602 Acute respiratory failure with hypercapnia: Principal | ICD-10-CM | POA: Diagnosis present

## 2023-10-19 DIAGNOSIS — Z886 Allergy status to analgesic agent status: Secondary | ICD-10-CM

## 2023-10-19 DIAGNOSIS — Z885 Allergy status to narcotic agent status: Secondary | ICD-10-CM

## 2023-10-19 DIAGNOSIS — K219 Gastro-esophageal reflux disease without esophagitis: Secondary | ICD-10-CM | POA: Diagnosis not present

## 2023-10-19 DIAGNOSIS — T17890A Other foreign object in other parts of respiratory tract causing asphyxiation, initial encounter: Secondary | ICD-10-CM | POA: Diagnosis not present

## 2023-10-19 DIAGNOSIS — G9340 Encephalopathy, unspecified: Secondary | ICD-10-CM | POA: Diagnosis not present

## 2023-10-19 DIAGNOSIS — Q6672 Congenital pes cavus, left foot: Secondary | ICD-10-CM

## 2023-10-19 DIAGNOSIS — L89620 Pressure ulcer of left heel, unstageable: Secondary | ICD-10-CM | POA: Diagnosis present

## 2023-10-19 DIAGNOSIS — T17990A Other foreign object in respiratory tract, part unspecified in causing asphyxiation, initial encounter: Secondary | ICD-10-CM | POA: Diagnosis not present

## 2023-10-19 DIAGNOSIS — H9190 Unspecified hearing loss, unspecified ear: Secondary | ICD-10-CM | POA: Diagnosis present

## 2023-10-19 DIAGNOSIS — Z515 Encounter for palliative care: Secondary | ICD-10-CM

## 2023-10-19 DIAGNOSIS — S72142D Displaced intertrochanteric fracture of left femur, subsequent encounter for closed fracture with routine healing: Secondary | ICD-10-CM | POA: Diagnosis not present

## 2023-10-19 DIAGNOSIS — K5641 Fecal impaction: Secondary | ICD-10-CM | POA: Diagnosis not present

## 2023-10-19 DIAGNOSIS — I700 Atherosclerosis of aorta: Secondary | ICD-10-CM | POA: Diagnosis not present

## 2023-10-19 DIAGNOSIS — R0902 Hypoxemia: Secondary | ICD-10-CM | POA: Diagnosis not present

## 2023-10-19 DIAGNOSIS — G629 Polyneuropathy, unspecified: Secondary | ICD-10-CM | POA: Diagnosis not present

## 2023-10-19 DIAGNOSIS — Z8543 Personal history of malignant neoplasm of ovary: Secondary | ICD-10-CM

## 2023-10-19 DIAGNOSIS — K449 Diaphragmatic hernia without obstruction or gangrene: Secondary | ICD-10-CM | POA: Diagnosis not present

## 2023-10-19 DIAGNOSIS — R339 Retention of urine, unspecified: Secondary | ICD-10-CM | POA: Diagnosis not present

## 2023-10-19 DIAGNOSIS — Z90710 Acquired absence of both cervix and uterus: Secondary | ICD-10-CM

## 2023-10-19 DIAGNOSIS — M810 Age-related osteoporosis without current pathological fracture: Secondary | ICD-10-CM | POA: Diagnosis present

## 2023-10-19 DIAGNOSIS — J209 Acute bronchitis, unspecified: Secondary | ICD-10-CM | POA: Diagnosis present

## 2023-10-19 DIAGNOSIS — Z87442 Personal history of urinary calculi: Secondary | ICD-10-CM

## 2023-10-19 DIAGNOSIS — E876 Hypokalemia: Secondary | ICD-10-CM | POA: Diagnosis not present

## 2023-10-19 DIAGNOSIS — Z888 Allergy status to other drugs, medicaments and biological substances status: Secondary | ICD-10-CM

## 2023-10-19 DIAGNOSIS — J9622 Acute and chronic respiratory failure with hypercapnia: Secondary | ICD-10-CM | POA: Diagnosis not present

## 2023-10-19 DIAGNOSIS — Z87891 Personal history of nicotine dependence: Secondary | ICD-10-CM

## 2023-10-19 DIAGNOSIS — J9601 Acute respiratory failure with hypoxia: Secondary | ICD-10-CM | POA: Diagnosis not present

## 2023-10-19 DIAGNOSIS — T380X5A Adverse effect of glucocorticoids and synthetic analogues, initial encounter: Secondary | ICD-10-CM | POA: Diagnosis present

## 2023-10-19 DIAGNOSIS — N390 Urinary tract infection, site not specified: Secondary | ICD-10-CM | POA: Diagnosis present

## 2023-10-19 DIAGNOSIS — L89626 Pressure-induced deep tissue damage of left heel: Secondary | ICD-10-CM | POA: Diagnosis not present

## 2023-10-19 DIAGNOSIS — Z9049 Acquired absence of other specified parts of digestive tract: Secondary | ICD-10-CM

## 2023-10-19 DIAGNOSIS — K5730 Diverticulosis of large intestine without perforation or abscess without bleeding: Secondary | ICD-10-CM | POA: Diagnosis not present

## 2023-10-19 DIAGNOSIS — I2510 Atherosclerotic heart disease of native coronary artery without angina pectoris: Secondary | ICD-10-CM | POA: Diagnosis not present

## 2023-10-19 DIAGNOSIS — Z7984 Long term (current) use of oral hypoglycemic drugs: Secondary | ICD-10-CM

## 2023-10-19 DIAGNOSIS — R918 Other nonspecific abnormal finding of lung field: Secondary | ICD-10-CM | POA: Diagnosis not present

## 2023-10-19 DIAGNOSIS — K5900 Constipation, unspecified: Secondary | ICD-10-CM | POA: Diagnosis present

## 2023-10-19 DIAGNOSIS — N179 Acute kidney failure, unspecified: Secondary | ICD-10-CM | POA: Diagnosis not present

## 2023-10-19 DIAGNOSIS — I5032 Chronic diastolic (congestive) heart failure: Secondary | ICD-10-CM | POA: Diagnosis present

## 2023-10-19 DIAGNOSIS — J9809 Other diseases of bronchus, not elsewhere classified: Secondary | ICD-10-CM | POA: Diagnosis present

## 2023-10-19 DIAGNOSIS — I890 Lymphedema, not elsewhere classified: Secondary | ICD-10-CM | POA: Diagnosis not present

## 2023-10-19 DIAGNOSIS — E66811 Obesity, class 1: Secondary | ICD-10-CM | POA: Diagnosis present

## 2023-10-19 DIAGNOSIS — R140 Abdominal distension (gaseous): Secondary | ICD-10-CM | POA: Diagnosis not present

## 2023-10-19 DIAGNOSIS — Z813 Family history of other psychoactive substance abuse and dependence: Secondary | ICD-10-CM

## 2023-10-19 DIAGNOSIS — Z833 Family history of diabetes mellitus: Secondary | ICD-10-CM

## 2023-10-19 LAB — RESP PANEL BY RT-PCR (RSV, FLU A&B, COVID)  RVPGX2
Influenza A by PCR: NEGATIVE
Influenza B by PCR: NEGATIVE
Resp Syncytial Virus by PCR: NEGATIVE
SARS Coronavirus 2 by RT PCR: NEGATIVE

## 2023-10-19 LAB — BASIC METABOLIC PANEL WITH GFR
Anion gap: 13 (ref 5–15)
BUN: 20 mg/dL (ref 8–23)
CO2: 38 mmol/L — ABNORMAL HIGH (ref 22–32)
Calcium: 9.4 mg/dL (ref 8.9–10.3)
Chloride: 86 mmol/L — ABNORMAL LOW (ref 98–111)
Creatinine, Ser: 1.26 mg/dL — ABNORMAL HIGH (ref 0.44–1.00)
GFR, Estimated: 43 mL/min — ABNORMAL LOW (ref >60)
Glucose, Bld: 127 mg/dL — ABNORMAL HIGH (ref 70–99)
Potassium: 3 mmol/L — ABNORMAL LOW (ref 3.5–5.1)
Sodium: 137 mmol/L (ref 135–145)

## 2023-10-19 LAB — BLOOD GAS, VENOUS
Acid-Base Excess: 21.1 mmol/L — ABNORMAL HIGH (ref 0.0–2.0)
Acid-Base Excess: 21 mmol/L — ABNORMAL HIGH (ref 0.0–2.0)
Bicarbonate: 52 mmol/L — ABNORMAL HIGH (ref 20.0–28.0)
Bicarbonate: 52.7 mmol/L — ABNORMAL HIGH (ref 20.0–28.0)
Delivery systems: POSITIVE
FIO2: 40 %
O2 Saturation: 69.7 %
O2 Saturation: 19.8 %
PEEP: 5 cmH2O
Patient temperature: 37
Patient temperature: 37
Pressure control: 10 cmH2O
RATE: 20 {breaths}/min
pCO2, Ven: 92 mmHg (ref 44–60)
pCO2, Ven: 100 mmHg (ref 44–60)
pH, Ven: 7.36 (ref 7.25–7.43)
pH, Ven: 7.33 (ref 7.25–7.43)
pO2, Ven: 43 mmHg (ref 32–45)

## 2023-10-19 LAB — URINALYSIS, ROUTINE W REFLEX MICROSCOPIC
Bilirubin Urine: NEGATIVE
Glucose, UA: NEGATIVE mg/dL
Hgb urine dipstick: NEGATIVE
Ketones, ur: NEGATIVE mg/dL
Nitrite: NEGATIVE
Protein, ur: NEGATIVE mg/dL
Specific Gravity, Urine: 1.009 (ref 1.005–1.030)
pH: 5 (ref 5.0–8.0)

## 2023-10-19 LAB — HEPATIC FUNCTION PANEL
ALT: 14 U/L (ref 0–44)
AST: 23 U/L (ref 15–41)
Albumin: 3.6 g/dL (ref 3.5–5.0)
Alkaline Phosphatase: 96 U/L (ref 38–126)
Bilirubin, Direct: 0.1 mg/dL (ref 0.0–0.2)
Indirect Bilirubin: 0.5 mg/dL (ref 0.3–0.9)
Total Bilirubin: 0.6 mg/dL (ref 0.0–1.2)
Total Protein: 7.2 g/dL (ref 6.5–8.1)

## 2023-10-19 LAB — CBC
HCT: 39.8 % (ref 36.0–46.0)
Hemoglobin: 12.5 g/dL (ref 12.0–15.0)
MCH: 28.3 pg (ref 26.0–34.0)
MCHC: 31.4 g/dL (ref 30.0–36.0)
MCV: 90.2 fL (ref 80.0–100.0)
Platelets: 365 K/uL (ref 150–400)
RBC: 4.41 MIL/uL (ref 3.87–5.11)
RDW: 18 % — ABNORMAL HIGH (ref 11.5–15.5)
WBC: 7.2 K/uL (ref 4.0–10.5)
nRBC: 0 % (ref 0.0–0.2)

## 2023-10-19 LAB — PHOSPHORUS: Phosphorus: 4.2 mg/dL (ref 2.5–4.6)

## 2023-10-19 LAB — BRAIN NATRIURETIC PEPTIDE: B Natriuretic Peptide: 57.3 pg/mL (ref 0.0–100.0)

## 2023-10-19 LAB — TROPONIN I (HIGH SENSITIVITY)
Troponin I (High Sensitivity): 16 ng/L (ref <18)
Troponin I (High Sensitivity): 13 ng/L (ref <18)

## 2023-10-19 LAB — CBG MONITORING, ED: Glucose-Capillary: 91 mg/dL (ref 70–99)

## 2023-10-19 LAB — MAGNESIUM: Magnesium: 1.5 mg/dL — ABNORMAL LOW (ref 1.7–2.4)

## 2023-10-19 LAB — LACTIC ACID, PLASMA
Lactic Acid, Venous: 0.9 mmol/L (ref 0.5–1.9)
Lactic Acid, Venous: 1.3 mmol/L (ref 0.5–1.9)

## 2023-10-19 MED ORDER — SODIUM CHLORIDE 0.9 % IV SOLN
2.0000 g | INTRAVENOUS | Status: DC
  Administered 2023-10-20: 2 g via INTRAVENOUS
  Filled 2023-10-19 (×2): qty 20

## 2023-10-19 MED ORDER — LACTATED RINGERS IV SOLN: INTRAVENOUS | Status: AC

## 2023-10-19 MED ORDER — INSULIN ASPART 100 UNIT/ML IJ SOLN: 0.0000 [IU] | Freq: Three times a day (TID) | INTRAMUSCULAR | Status: DC

## 2023-10-19 MED ORDER — LACTATED RINGERS IV BOLUS
1000.0000 mL | Freq: Once | INTRAVENOUS | Status: AC
  Administered 2023-10-19: 1000 mL via INTRAVENOUS

## 2023-10-19 MED ORDER — FERROUS SULFATE 325 (65 FE) MG PO TABS
325.0000 mg | ORAL_TABLET | Freq: Every day | ORAL | Status: DC
  Administered 2023-10-21 – 2023-10-27 (×7): 325 mg via ORAL
  Filled 2023-10-19 (×7): qty 1

## 2023-10-19 MED ORDER — METHOCARBAMOL 500 MG PO TABS
500.0000 mg | ORAL_TABLET | Freq: Three times a day (TID) | ORAL | Status: DC | PRN
  Administered 2023-10-26 – 2023-10-27 (×2): 500 mg via ORAL
  Filled 2023-10-19 (×2): qty 1

## 2023-10-19 MED ORDER — HYDROCODONE-ACETAMINOPHEN 5-325 MG PO TABS
1.0000 | ORAL_TABLET | Freq: Four times a day (QID) | ORAL | Status: DC | PRN
  Administered 2023-10-20 – 2023-10-25 (×4): 1 via ORAL
  Filled 2023-10-19 (×4): qty 1

## 2023-10-19 MED ORDER — HYDRALAZINE HCL 20 MG/ML IJ SOLN
5.0000 mg | INTRAMUSCULAR | Status: DC | PRN
  Administered 2023-10-26 – 2023-10-27 (×2): 5 mg via INTRAVENOUS
  Filled 2023-10-19 (×2): qty 1

## 2023-10-19 MED ORDER — INSULIN ASPART 100 UNIT/ML IJ SOLN: 0.0000 [IU] | Freq: Every day | INTRAMUSCULAR | Status: DC

## 2023-10-19 MED ORDER — PANTOPRAZOLE SODIUM 40 MG PO TBEC
40.0000 mg | DELAYED_RELEASE_TABLET | Freq: Every day | ORAL | Status: DC
  Administered 2023-10-21 – 2023-10-27 (×7): 40 mg via ORAL
  Filled 2023-10-19 (×7): qty 1

## 2023-10-19 MED ORDER — RENA-VITE PO TABS
1.0000 | ORAL_TABLET | Freq: Every day | ORAL | Status: DC
  Administered 2023-10-20 – 2023-10-26 (×7): 1 via ORAL
  Filled 2023-10-19 (×8): qty 1

## 2023-10-19 MED ORDER — ACETAMINOPHEN 650 MG RE SUPP: 650.0000 mg | Freq: Four times a day (QID) | RECTAL | Status: DC | PRN

## 2023-10-19 MED ORDER — LORATADINE 10 MG PO TABS
10.0000 mg | ORAL_TABLET | Freq: Every day | ORAL | Status: DC
  Administered 2023-10-21 – 2023-10-27 (×7): 10 mg via ORAL
  Filled 2023-10-19 (×7): qty 1

## 2023-10-19 MED ORDER — IPRATROPIUM-ALBUTEROL 0.5-2.5 (3) MG/3ML IN SOLN
3.0000 mL | RESPIRATORY_TRACT | Status: DC
  Administered 2023-10-19 – 2023-10-20 (×6): 3 mL via RESPIRATORY_TRACT
  Filled 2023-10-19 (×6): qty 3

## 2023-10-19 MED ORDER — IOHEXOL 350 MG/ML SOLN
75.0000 mL | Freq: Once | INTRAVENOUS | Status: AC | PRN
  Administered 2023-10-19: 75 mL via INTRAVENOUS

## 2023-10-19 MED ORDER — SODIUM CHLORIDE 0.9 % IV SOLN
1.0000 g | Freq: Once | INTRAVENOUS | Status: AC
  Administered 2023-10-19: 1 g via INTRAVENOUS
  Filled 2023-10-19: qty 10

## 2023-10-19 MED ORDER — DIPHENHYDRAMINE HCL 50 MG/ML IJ SOLN
12.5000 mg | Freq: Three times a day (TID) | INTRAMUSCULAR | Status: DC | PRN
  Administered 2023-10-23: 12.5 mg via INTRAVENOUS
  Filled 2023-10-19: qty 1

## 2023-10-19 MED ORDER — MIDODRINE HCL 5 MG PO TABS: 10.0000 mg | ORAL_TABLET | Freq: Three times a day (TID) | ORAL | Status: DC

## 2023-10-19 MED ORDER — DM-GUAIFENESIN ER 30-600 MG PO TB12: 1.0000 | ORAL_TABLET | Freq: Two times a day (BID) | ORAL | Status: DC | PRN

## 2023-10-19 MED ORDER — APIXABAN 5 MG PO TABS
5.0000 mg | ORAL_TABLET | Freq: Two times a day (BID) | ORAL | Status: DC
  Administered 2023-10-20 – 2023-10-27 (×14): 5 mg via ORAL
  Filled 2023-10-19 (×14): qty 1

## 2023-10-19 MED ORDER — MAGNESIUM SULFATE 2 GM/50ML IV SOLN
2.0000 g | Freq: Once | INTRAVENOUS | Status: AC
  Administered 2023-10-19: 2 g via INTRAVENOUS
  Filled 2023-10-19: qty 50

## 2023-10-19 MED ORDER — LEVOTHYROXINE SODIUM 100 MCG PO TABS
100.0000 ug | ORAL_TABLET | Freq: Every day | ORAL | Status: DC
  Administered 2023-10-21 – 2023-10-27 (×7): 100 ug via ORAL
  Filled 2023-10-19 (×7): qty 1

## 2023-10-19 MED ORDER — ALBUTEROL SULFATE (2.5 MG/3ML) 0.083% IN NEBU: 2.5000 mg | INHALATION_SOLUTION | RESPIRATORY_TRACT | Status: DC | PRN

## 2023-10-19 MED ORDER — POTASSIUM CHLORIDE 10 MEQ/100ML IV SOLN
10.0000 meq | INTRAVENOUS | Status: AC
  Administered 2023-10-19 (×2): 10 meq via INTRAVENOUS
  Filled 2023-10-19 (×2): qty 100

## 2023-10-19 NOTE — Telephone Encounter (Signed)
Verbal orders approved.

## 2023-10-19 NOTE — Telephone Encounter (Signed)
 Contacted mark to approve verbal orders.

## 2023-10-19 NOTE — Telephone Encounter (Signed)
 FYI Only or Action Required?: FYI only for provider.  Patient was last seen in primary care on 06/30/2023 by Wendee Lynwood HERO, NP.  Called Nurse Triage reporting Shortness of Breath.  Symptoms began today.  Interventions attempted: Nothing.  Symptoms are: rapidly worsening.  Triage Disposition: Call EMS 911 Now  Patient/caregiver understands and will follow disposition?: YesCopied from CRM #[id number]. Topic: Clinical - Red Word Triage >> Oct 19, 2023 12:49 PM Franky GRADE wrote: Red Word that prompted transfer to Nurse Triage: Glennda, from [facility] is calling because patient was diagnosed with chronic diastolic heart failure, while evaluating the patient, the patient mentioned she was having difficulty breathing and that it felt like an elephant was sitting on her chest. Reason for Disposition  SEVERE difficulty breathing (e.g., struggling for each breath, speaks in single words)  Answer Assessment - Initial Assessment Questions 1. RESPIRATORY STATUS: Describe your breathing? (e.g., wheezing, shortness of breath, unable to speak, severe coughing)    Moira from [facility] called with concerns. When Moira walked in room pt immediately told her she is having hard time breathing and feels like an elephant is sitting on chest. O2 ranged from 79-84%. OT was able to get it up to 90. RN cut triage and offered to call 911. Glennda stated she is going to call now.  Protocols used: Breathing Difficulty-A-AH

## 2023-10-19 NOTE — H&P (Signed)
 History and Physical    Stacy Kirk FMW:[id number] DOB: 1945/10/26 DOA: 10/19/2023  Referring MD/NP/PA:   PCP: Stacy Lynwood HERO, NP   Patient coming from:  The patient is coming from home.     Chief Complaint: Altered mental status and SOB.  HPI: Stacy Kirk is a 78 y.o. female with medical history significant of dCHF, PE on Eliquis , HTN, DM, hypothyroidism, GI bleeding, CKD-3A, lymphedema, hard of hearing, ovarian cancer, lumbar spinal stenosis, s/p of surgery for left hip fracture 09/09/23, who presents with AMS and SOB.  Patient has AMS, and is unable to provide accurate medical history, therefore, most of the history is obtained by discussing the case with ED physician, per EMS report, and with the nursing staff. Per report, pt has SOB in the past several days and has been confused.  Patient was found to have acute respiratory distress with oxygen desaturation to 87%, requiring BiPAP.  No active nausea vomiting, diarrhea noted.  Not sure if patient has symptoms of UTI.  Temperature normal 98.2 in ED.  VBG at 17:00 showed 7.36, CO2 92, O2 43. The repeated VBG on 21:55 showed pH 7.33, CO2 100, O2 16.   Data reviewed independently and ED Course: pt was found to have WBC 7.2, potassium 3.0, magnesium  1.5, stable renal function, troponin 16 --> 13, lactic acid 0.9 --> 1.3, UA (clear appearance, small amount leukocyte, rare bacteria, WBC 6-10).  Temperature normal, blood pressure 84/60, which improved to 04/28/2000/66 after giving 1 L LR in ED, heart rate of 36 --> 94 --> 73, RR 21. Chest x-ray negative.  CT of head negative for acute intracranial abnormalities.  CTA negative for PE.  CT abdomen/pelvis showed bladder distention and possible bladder outlet obstruction. Patient is admitted to SDU as inpatient. Consulted Dr. Isadora and APP Griffin Hopes of ICU  CTA: 1. No pulmonary embolus. 2. Scattered atelectasis. Bronchial thickening in the lower lobes with mucoid impaction on  the left. 3. 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 4. Moderate-sized hiatal hernia.   Aortic Atherosclerosis (ICD10-I70.0).  CT-abdomen/pelvis: 1. Bladder is markedly distended with moderate fullness of the bilateral ureters and collecting systems. No evidence of renal or ureteral calculi. Recommend correlation for bladder outlet obstruction. 2. Descending sigmoid colonic diverticulosis without evidence of acute diverticulitis. 3. Moderate volume of stool distending the rectum. 4. Small to moderate hiatal hernia. 5. Interval progression of superior endplate compression deformity of the L2 vertebral body now demonstrating approximately 45-50% height loss, compared to 30% previously. Similar mild retropulsion of the posterosuperior endplate. 6. Status post ORIF of a left proximal femur fracture. 7.  Aortic Atherosclerosis (ICD10-I70.0).     EKG: I have personally reviewed.  A-fib, QTc 531, early R wave progression, heart rate 107.   Review of Systems: Could not be reviewed due to altered mental status.  Allergy:  Allergies  Allergen Reactions   Erythromycin Diarrhea and Other (See Comments)    Tears her stomach up. Major digestive upset.   Tramadol Shortness Of Breath and Other (See Comments)    Felt like lungs filled up; unable to lay down   Albumin (Human) Other (See Comments)    Pt unsure of reaction   Aspirin  Itching and Other (See Comments)    Caused lethargy, also   Codeine Itching and Other (See Comments)    Cause lethargy, also   Cortisone Other (See Comments)    NO STEROID INJECTIONS; headaches   Linzess  [Linaclotide ] Diarrhea  Propoxyphene Other (See Comments)    Terrible reaction; headaches   Simvastatin Itching and Other (See Comments)    Severe muscle aches   Triamcinolone Acetonide Other (See Comments)    headache    Past Medical History:  Diagnosis Date   Arthritis    Cancer (HCC)    ovarian, skin  face- skin cancer   Complication of anesthesia    slow to awaken everday, even slower with anesthesia   Constipation    Diabetes mellitus    Type II   GERD (gastroesophageal reflux disease)    Headache 05/12/2015   after a fall headache for 8 months   Hemorrhoids    Hernia    History of kidney stones    passed   Hypertension    Hypothyroidism    Loss of appetite    Muscle spasm    back   Osteoporosis    Redness    abd wound   Urine frequency    unable to hold urine    Wears hearing aid    bilateral     Past Surgical History:  Procedure Laterality Date   ABDOMINAL HYSTERECTOMY     APPENDECTOMY     CHOLECYSTECTOMY     COLONOSCOPY W/ POLYPECTOMY     HERNIA REPAIR  Jun2012   open component separation w biologic mesh (Dr. Vanderbilt)   INTRAMEDULLARY (IM) NAIL INTERTROCHANTERIC Left 09/09/2023   Procedure: FIXATION, FRACTURE, INTERTROCHANTERIC, WITH INTRAMEDULLARY ROD;  Surgeon: Lorelle Hussar, MD;  Location: [facility] ORS;  Service: Orthopedics;  Laterality: Left;   TOE SURGERY Left    2nd toe   TONSILLECTOMY      Social History:  reports that she quit smoking about 25 years ago. Her smoking use included cigarettes. She started smoking about 40 years ago. She has been exposed to tobacco smoke. She has never used smokeless tobacco. She reports that she does not drink alcohol and does not use drugs.  Family History:  Family History  Problem Relation Age of Onset   Diabetes Mother    Early death Sister    Drug abuse Sister      Prior to Admission medications   Medication Sig Start Date End Date Taking? Authorizing Provider  acetaminophen  (TYLENOL ) 500 MG tablet Take 1,000 mg by mouth every 6 (six) hours as needed (FOR PAIN.).    [provider]  cetirizine  (ZYRTEC ) 10 MG tablet Take 1 tablet (10 mg total) by mouth daily. 06/07/23   Stacy Lynwood HERO, NP  ELIQUIS  5 MG TABS tablet TAKE 1 TABLET BY MOUTH TWICE A DAY 05/02/23   Stacy Lynwood HERO, NP  ferrous sulfate  325 (65  FE) MG tablet TAKE 325 MG BY MOUTH DAILY. 08/30/23   Stacy Lynwood HERO, NP  fluticasone  (FLONASE ) 50 MCG/ACT nasal spray SPRAY 2 SPRAYS INTO EACH NOSTRIL EVERY DAY 10/13/23   Stacy Lynwood HERO, NP  HYDROcodone -acetaminophen  (NORCO/VICODIN) 5-325 MG tablet Take 1 tablet by mouth every 6 (six) hours as needed for moderate pain (pain score 4-6). 09/12/23   Charlene Debby BROCKS, PA-C  lactose free nutrition (BOOST PLUS) LIQD Take 237 mLs by mouth 2 (two) times daily between meals. 09/14/23   Awanda City, MD  levothyroxine  (SYNTHROID ) 100 MCG tablet TAKE 1 TABLET BY MOUTH EVERY DAY 03/09/23   Stacy Lynwood HERO, NP  lidocaine  (LIDODERM ) 5 % Place 1-2 patches onto the skin at bedtime. Remove & Discard patch within 12 hours or as directed by MD 09/14/23   Awanda [place], MD  metFORMIN  (  GLUCOPHAGE ) 500 MG tablet TAKE 1 TABLET (500 MG TOTAL) BY MOUTH DAILY AFTER SUPPER. 03/02/23   Stacy Lynwood HERO, NP  methocarbamol  (ROBAXIN ) 500 MG tablet Take 1 tablet (500 mg total) by mouth every 8 (eight) hours as needed for muscle spasms. 09/14/23   Awanda City, MD  metoprolol  succinate (TOPROL -XL) 25 MG 24 hr tablet Take 1 tablet (25 mg total) by mouth daily. 03/16/23   Stacy Lynwood HERO, NP  multivitamin (RENA-VIT) TABS tablet Take 1 tablet by mouth at bedtime. 09/14/23   Awanda City, MD  pantoprazole  (PROTONIX ) 40 MG tablet TAKE 1 TABLET BY MOUTH EVERY DAY 03/02/23   Stacy Lynwood HERO, NP  spironolactone  (ALDACTONE ) 25 MG tablet TAKE 1 TABLET (25 MG TOTAL) BY MOUTH DAILY. Patient not taking: Reported on 10/17/2023 05/15/23   Stacy Lynwood HERO, NP  torsemide  (DEMADEX ) 20 MG tablet Take 2 tablets (40 mg total) by mouth daily. Patient not taking: Reported on 10/17/2023 05/15/23   Darliss Rogue, MD    Physical Exam: Vitals:   10/19/23 1845 10/19/23 1900 10/19/23 1930 10/19/23 2115  BP: (!) 121/101 (!) 124/91 102/66 (!) 108/53  Pulse: 86 94 73 67  Resp: 14 18 18 20   Temp:      TempSrc:      SpO2: 100% 100% 97% 98%  Weight:      Height:       General: Has  acute respiratory distress, HEENT:       Eyes: PERRL, EOMI, no jaundice       ENT: No discharge from the ears and nose, no pharynx injection.       Neck: No JVD, no bruit, no mass felt. Heme: No neck lymph node enlargement. Cardiac: S1/S2, RRR, No murmurs, No gallops or rubs. Respiratory: No rales, wheezing, rhonchi or rubs. GI: Soft, nondistended, patient seems to have tenderness in the suprapubic area, no organomegaly, BS present. GU: No hematuria Ext: No pitting leg edema bilaterally. 1+DP/PT pulse bilaterally. Musculoskeletal: No joint deformities, No joint redness or warmth, no limitation of ROM in spin. Skin: No rashes.  Neuro: Confused, arousable, knows her own name, not oriented to place and time, Cranial nerves II-XII grossly intact, moves all extremities. Psych: Patient is not psychotic, no suicidal or hemocidal ideation.  Labs on Admission: I have personally reviewed following labs and imaging studies  CBC: Recent Labs  Lab 10/19/23 1434  WBC 7.2  HGB 12.5  HCT 39.8  MCV 90.2  PLT 365   Basic Metabolic Panel: Recent Labs  Lab 10/19/23 1434 10/19/23 1658  NA 137  --   K 3.0*  --   CL 86*  --   CO2 38*  --   GLUCOSE 127*  --   BUN 20  --   CREATININE 1.26*  --   CALCIUM 9.4  --   MG  --  1.5*  PHOS  --  4.2   GFR: Estimated Creatinine Clearance: 35.6 mL/min (A) (by C-G formula based on SCr of 1.26 mg/dL (H)). Liver Function Tests: Recent Labs  Lab 10/19/23 1658  AST 23  ALT 14  ALKPHOS 96  BILITOT 0.6  PROT 7.2  ALBUMIN 3.6   No results for input(s): LIPASE, AMYLASE in the last 168 hours. No results for input(s): AMMONIA in the last 168 hours. Coagulation Profile: No results for input(s): INR, PROTIME in the last 168 hours. Cardiac Enzymes: No results for input(s): CKTOTAL, CKMB, CKMBINDEX, TROPONINI in the last 168 hours. BNP (last 3 results) Recent Labs  11/29/22 1028 12/16/22 1130 05/12/23 1407  PROBNP 106.0* 31.0  33.0   HbA1C: No results for input(s): HGBA1C in the last 72 hours. CBG: No results for input(s): GLUCAP in the last 168 hours. Lipid Profile: No results for input(s): CHOL, HDL, LDLCALC, TRIG, CHOLHDL, LDLDIRECT in the last 72 hours. Thyroid  Function Tests: No results for input(s): TSH, T4TOTAL, FREET4, T3FREE, THYROIDAB in the last 72 hours. Anemia Panel: No results for input(s): VITAMINB12, FOLATE, FERRITIN, TIBC, IRON , RETICCTPCT in the last 72 hours. Urine analysis:    Component Value Date/Time   COLORURINE YELLOW (A) 10/19/2023 1928   APPEARANCEUR CLEAR (A) 10/19/2023 1928   LABSPEC 1.009 10/19/2023 1928   PHURINE 5.0 10/19/2023 1928   GLUCOSEU NEGATIVE 10/19/2023 1928   HGBUR NEGATIVE 10/19/2023 1928   BILIRUBINUR NEGATIVE 10/19/2023 1928   BILIRUBINUR negative 06/07/2023 1217   KETONESUR NEGATIVE 10/19/2023 1928   PROTEINUR NEGATIVE 10/19/2023 1928   UROBILINOGEN 0.2 06/07/2023 1217   NITRITE NEGATIVE 10/19/2023 1928   LEUKOCYTESUR SMALL (A) 10/19/2023 1928   Sepsis Labs: @LABRCNTIP (procalcitonin:4,lacticidven:4) ) Recent Results (from the past 240 hours)  Resp panel by RT-PCR (RSV, Flu A&B, Covid) Anterior Nasal Swab     Status: None   Collection Time: 10/19/23  9:55 PM   Specimen: Anterior Nasal Swab  Result Value Ref Range Status   SARS Coronavirus 2 by RT PCR NEGATIVE NEGATIVE Final    Comment: (NOTE) SARS-CoV-2 target nucleic acids are NOT DETECTED.  The SARS-CoV-2 RNA is generally detectable in upper respiratory specimens during the acute phase of infection. The lowest concentration of SARS-CoV-2 viral copies this assay can detect is 138 copies/mL. A negative result does not preclude SARS-Cov-2 infection and should not be used as the sole basis for treatment or other patient management decisions. A negative result may occur with  improper specimen collection/handling, submission of specimen other than nasopharyngeal  swab, presence of viral mutation(s) within the areas targeted by this assay, and inadequate number of viral copies(<138 copies/mL). A negative result must be combined with clinical observations, patient history, and epidemiological information. The expected result is Negative.  Fact Sheet for Patients:  BloggerCourse.com  Fact Sheet for Healthcare Providers:  SeriousBroker.it  This test is no t yet approved or cleared by the [place]  FDA and  has been authorized for detection and/or diagnosis of SARS-CoV-2 by FDA under an Emergency Use Authorization (EUA). This EUA will remain  in effect (meaning this test can be used) for the duration of the COVID-19 declaration under Section 564(b)(1) of the Act, 21 U.S.C.section 360bbb-3(b)(1), unless the authorization is terminated  or revoked sooner.       Influenza A by PCR NEGATIVE NEGATIVE Final   Influenza B by PCR NEGATIVE NEGATIVE Final    Comment: (NOTE) The Xpert Xpress SARS-CoV-2/FLU/RSV plus assay is intended as an aid in the diagnosis of influenza from Nasopharyngeal swab specimens and should not be used as a sole basis for treatment. Nasal washings and aspirates are unacceptable for Xpert Xpress SARS-CoV-2/FLU/RSV testing.  Fact Sheet for Patients: BloggerCourse.com  Fact Sheet for Healthcare Providers: SeriousBroker.it  This test is not yet approved or cleared by the [place]  FDA and has been authorized for detection and/or diagnosis of SARS-CoV-2 by FDA under an Emergency Use Authorization (EUA). This EUA will remain in effect (meaning this test can be used) for the duration of the COVID-19 declaration under Section 564(b)(1) of the Act, 21 U.S.C. section 360bbb-3(b)(1), unless the authorization is terminated or revoked.  Resp Syncytial Virus by PCR NEGATIVE NEGATIVE Final    Comment: (NOTE) Fact Sheet for  Patients: BloggerCourse.com  Fact Sheet for Healthcare Providers: SeriousBroker.it  This test is not yet approved or cleared by the [place]  FDA and has been authorized for detection and/or diagnosis of SARS-CoV-2 by FDA under an Emergency Use Authorization (EUA). This EUA will remain in effect (meaning this test can be used) for the duration of the COVID-19 declaration under Section 564(b)(1) of the Act, 21 U.S.C. section 360bbb-3(b)(1), unless the authorization is terminated or revoked.  Performed at [facility], [address] [place], [place] [address]      Radiological Exams on Admission:   Assessment/Plan Principal Problem:   Acute respiratory failure with hypoxia and hypercapnia (HCC) Active Problems:   Acute metabolic encephalopathy   Hypotension   UTI (urinary tract infection)   Acute urinary retention   Chronic diastolic CHF (congestive heart failure) (HCC)   Hypertension   Pulmonary embolism (HCC)   Chronic kidney disease, stage 3a (HCC)   Hypothyroidism   Type II diabetes mellitus with renal manifestations (HCC)   Iron  deficiency anemia   Hypokalemia   Hypomagnesemia   Obesity (BMI 30-39.9)   Assessment and Plan:  Acute respiratory failure with hypoxia and hypercapnia (HCC): currently requiring BiPAP. VBG at 17:00 showed 7.36, CO2 92, O2 43. The repeated VBG on 21:55 showed pH 7.33, CO2 100, O2 16. Seems to be worsening.  Etiology is not clear.  No signs of CHF exacerbation.  BNP 57.  CTA negative for PE.  No fever or leukocytosis, chest x-ray negative for infiltration, clinically no pneumonia.  Negative PCR for COVID, flu and RSV.  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 Consulted Dr. Isadora and APP Griffin Hopes of ICU.  -admit to SDU as inpt -continue BIPAP - Bronchodilators, as needed Mucinex  - Deep  suction  Acute metabolic encephalopathy: Likely multifactorial etiology including hypoxia, hypercapnia, possible UTI, electrolyte disturbance.  No focal neurodeficit on physical examination.  CT of head negative for acute intracranial abnormalities. - Frequent neurocheck - Fall precaution  Hypotension: Initial blood pressure 84/60, which improved to 102/66.  No fever or leukocytosis, does not seem to have sepsis.  Lactic acid normal x 2. - IV fluid: 1 L LR, then 75 cc/h - Hold blood pressure medications and diuretics. - start midodrine  10 mg tid  Possible UTI (urinary tract infection): -Rocephin  -f/u blood culture urine culture  Acute urinary retention -Foley catheter placement  Chronic diastolic CHF (congestive heart failure) (HCC): 2D echo on 07/26/2023 showed EF of 50-55%.  Patient does not have leg edema, BNP normal 57.3.  CHF seem to be compensated. - Hold torsemide  and spironolactone  due to soft blood pressure  Hypertension -Hold blood pressure medications the diuretics due to softer blood pressure - IV hydralazine  as needed  Pulmonary embolism (HCC) -Eliquis   Chronic kidney disease, stage 3a (HCC): Renal function stable.  Recent baseline creatinine 1.47 on 09/14/2023.  Her creatinine is 1.26, BUN 20. - Follow-up with BMP  Hypothyroidism -Synthroid   Type II diabetes mellitus with renal manifestations [facility]): Recent A1c 6.9, well-controlled.  Patient is taking metformin . -SSI  Iron  deficiency anemia: Hemoglobin stable, 12.5 -Iron  supplement  Hypokalemia and Hypomagnesemia: Potassium 3.0, magnesium  1.5.  Phosphorus normal 4.2 -Replaced potassium and magnesium   Obesity (BMI 30-39.9): Patient has Obesity Class I, with body weight 83.0 Kg and BMI 33.47 kg/m2.  - Encourage losing weight -  Exercise and healthy diet      DVT ppx: on Eliquis   Code Status: Full code  Family Communication:     not done, no family member is at bed side.      Disposition Plan:   Anticipate discharge back to previous environment  Consults called:  Consulted Dr. Isadora and APP Griffin Hopes of ICU  Admission status and Level of care: Stepdown:  as inpt        Dispo: The patient is from: Home              Anticipated d/c is to: Home              Anticipated d/c date is: 2 days              Patient currently is not medically stable to d/c.    Severity of Illness:  The appropriate patient status for this patient is INPATIENT. Inpatient status is judged to be reasonable and necessary in order to provide the required intensity of service to ensure the patient's safety. The patient's presenting symptoms, physical exam findings, and initial radiographic and laboratory data in the context of their chronic comorbidities is felt to place them at high risk for further clinical deterioration. Furthermore, it is not anticipated that the patient will be medically stable for discharge from the hospital within 2 midnights of admission.   * I certify that at the point of admission it is my clinical judgment that the patient will require inpatient hospital care spanning beyond 2 midnights from the point of admission due to high intensity of service, high risk for further deterioration and high frequency of surveillance required.*       Date of Service 10/19/2023    Caleb Exon [facility]   If 7PM-7AM, please contact night-coverage [email or web address] 10/19/2023, 11:05 PM

## 2023-10-19 NOTE — ED Provider Notes (Signed)
 [facility] Provider Note    Event Date/Time   First MD Initiated Contact with Patient 10/19/23 1617     (approximate)   History   Chief Complaint Altered Mental Status and Shortness of Breath   HPI  Stacy Kirk is a 78 y.o. female with past medical history of hypertension, diabetes, HFpEF, PE on Eliquis , and CKD who presents to the ED for altered mental status.  Per EMS, patient has been having difficulty breathing and increased confusion over the past 3 days.  She recently returned home from rehab following surgery for left hip fracture about 1 month ago, was found to be hypoxic to 87% on room air and placed on 4 L nasal cannula.  She was apparently alert and oriented to person and place on arrival, but is somnolent after being placed in a room, no verbal response noted.     Physical Exam   Triage Vital Signs: ED Triage Vitals  Encounter Vitals Group     BP 10/19/23 1432 112/64     Girls Systolic BP Percentile --      Girls Diastolic BP Percentile --      Boys Systolic BP Percentile --      Boys Diastolic BP Percentile --      Pulse Rate 10/19/23 1432 (!) 44     Resp 10/19/23 1432 17     Temp 10/19/23 1432 98.2 F (36.8 C)     Temp Source 10/19/23 1432 Oral     SpO2 10/19/23 1432 98 %     Weight 10/19/23 1430 183 lb (83 kg)     Height 10/19/23 1430 5' 2 (1.575 m)     Head Circumference --      Peak Flow --      Pain Score 10/19/23 1430 2     Pain Loc --      Pain Education --      Exclude from Growth Chart --     Most recent vital signs: Vitals:   10/19/23 1930 10/19/23 2115  BP: 102/66 (!) 108/53  Pulse: 73 67  Resp: 18 20  Temp:    SpO2: 97% 98%    Constitutional: Somnolent but arousable to voice, no verbal response. Eyes: Conjunctivae are normal.  Pupils equal, round, and reactive to light bilaterally. Head: Atraumatic. Nose: No congestion/rhinnorhea. Mouth/Throat: Mucous membranes are moist.  Cardiovascular: Normal  rate, irregularly irregular rhythm. Grossly normal heart sounds.  2+ radial pulses bilaterally. Respiratory: Normal respiratory effort.  No retractions. Lungs CTAB. Gastrointestinal: Firm and diffusely tender to palpation with moderate distention. Musculoskeletal: No lower extremity tenderness nor edema.  Neurologic: No verbal response.  No gross focal neurologic deficits are appreciated, localizes to pain in all 4 extremities.    ED Results / Procedures / Treatments   Labs (all labs ordered are listed, but only abnormal results are displayed) Labs Reviewed  BASIC METABOLIC PANEL WITH GFR - Abnormal; Notable for the following components:      Result Value   Potassium 3.0 (*)    Chloride 86 (*)    CO2 38 (*)    Glucose, Bld 127 (*)    Creatinine, Ser 1.26 (*)    GFR, Estimated 43 (*)    All other components within normal limits  CBC - Abnormal; Notable for the following components:   RDW 18.0 (*)    All other components within normal limits  URINALYSIS, ROUTINE W REFLEX MICROSCOPIC - Abnormal; Notable for the following components:  Color, Urine YELLOW (*)    APPearance CLEAR (*)    Leukocytes,Ua SMALL (*)    Bacteria, UA RARE (*)    All other components within normal limits  BLOOD GAS, VENOUS - Abnormal; Notable for the following components:   pCO2, Ven 92 (*)    Bicarbonate 52.0 (*)    Acid-Base Excess 21.1 (*)    All other components within normal limits  MAGNESIUM  - Abnormal; Notable for the following components:   Magnesium  1.5 (*)    All other components within normal limits  BLOOD GAS, VENOUS - Abnormal; Notable for the following components:   pCO2, Ven 100 (*)    Bicarbonate 52.7 (*)    Acid-Base Excess 21.0 (*)    All other components within normal limits  RESP PANEL BY RT-PCR (RSV, FLU A&B, COVID)  RVPGX2  CULTURE, BLOOD (ROUTINE X 2)  CULTURE, BLOOD (ROUTINE X 2)  URINE CULTURE  BRAIN NATRIURETIC PEPTIDE  HEPATIC FUNCTION PANEL  LACTIC ACID, PLASMA  LACTIC  ACID, PLASMA  PHOSPHORUS  BASIC METABOLIC PANEL WITH GFR  CBC  BLOOD GAS, VENOUS  CBG MONITORING, ED  TROPONIN I (HIGH SENSITIVITY)  TROPONIN I (HIGH SENSITIVITY)     EKG  ED ECG REPORT I, Carlin Palin, the attending physician, personally viewed and interpreted this ECG.   Date: 10/19/2023  EKG Time: 14:37  Rate: 107  Rhythm: atrial fibrillation  Axis: Normal  Intervals:right bundle branch block and prolonged QT  ST&T Change: Anterolateral T wave inversions  RADIOLOGY Chest x-ray reviewed and interpreted by me with no infiltrate, edema, or effusion.  PROCEDURES:  Critical Care performed: Yes, see critical care procedure note(s)  .Critical Care  Performed by: Palin Carlin, MD Authorized by: Palin Carlin, MD   Critical care provider statement:    Critical care time (minutes):  30   Critical care time was exclusive of:  Separately billable procedures and treating other patients and teaching time   Critical care was necessary to treat or prevent imminent or life-threatening deterioration of the following conditions:  Respiratory failure   Critical care was time spent personally by me on the following activities:  Development of treatment plan with patient or surrogate, discussions with consultants, evaluation of patient's response to treatment, examination of patient, ordering and review of laboratory studies, ordering and review of radiographic studies, ordering and performing treatments and interventions, pulse oximetry, re-evaluation of patient's condition and review of old charts   I assumed direction of critical care for this patient from another provider in my specialty: no     Care discussed with: admitting provider      MEDICATIONS ORDERED IN ED: Medications  diphenhydrAMINE  (BENADRYL ) injection 12.5 mg (has no administration in time range)  hydrALAZINE  (APRESOLINE ) injection 5 mg (has no administration in time range)  acetaminophen  (TYLENOL ) suppository  650 mg (has no administration in time range)  cefTRIAXone  (ROCEPHIN ) 2 g in sodium chloride  0.9 % 100 mL IVPB (has no administration in time range)  ipratropium-albuterol  (DUONEB) 0.5-2.5 (3) MG/3ML nebulizer solution 3 mL (3 mLs Nebulization Given 10/19/23 2201)  albuterol  (PROVENTIL ) (2.5 MG/3ML) 0.083% nebulizer solution 2.5 mg (has no administration in time range)  dextromethorphan-guaiFENesin  (MUCINEX  DM) 30-600 MG per 12 hr tablet 1 tablet (has no administration in time range)  potassium chloride  10 mEq in 100 mL IVPB (10 mEq Intravenous New Bag/Given 10/19/23 2302)  insulin  aspart (novoLOG ) injection 0-9 Units (has no administration in time range)  insulin  aspart (novoLOG ) injection 0-5 Units ( Subcutaneous Not  Given 10/19/23 2306)  lactated ringers  infusion ( Intravenous New Bag/Given 10/19/23 2310)  midodrine  (PROAMATINE ) tablet 10 mg (10 mg Oral Not Given 10/19/23 2301)  HYDROcodone -acetaminophen  (NORCO/VICODIN) 5-325 MG per tablet 1 tablet (has no administration in time range)  levothyroxine  (SYNTHROID ) tablet 100 mcg (has no administration in time range)  pantoprazole  (PROTONIX ) EC tablet 40 mg (has no administration in time range)  apixaban  (ELIQUIS ) tablet 5 mg (has no administration in time range)  ferrous sulfate  tablet 325 mg (has no administration in time range)  methocarbamol  (ROBAXIN ) tablet 500 mg (has no administration in time range)  multivitamin (RENA-VIT) tablet 1 tablet (has no administration in time range)  loratadine  (CLARITIN ) tablet 10 mg (has no administration in time range)  lactated ringers  bolus 1,000 mL (0 mLs Intravenous Stopped 10/19/23 1929)  iohexol  (OMNIPAQUE ) 350 MG/ML injection 75 mL (75 mLs Intravenous Contrast Given 10/19/23 1744)  magnesium  sulfate IVPB 2 g 50 mL (0 g Intravenous Stopped 10/19/23 2020)  cefTRIAXone  (ROCEPHIN ) 1 g in sodium chloride  0.9 % 100 mL IVPB (0 g Intravenous Stopped 10/19/23 2119)     IMPRESSION / MDM / ASSESSMENT AND PLAN / ED  COURSE  I reviewed the triage vital signs and the nursing notes.                              78 y.o. female with past medical history of hypertension, diabetes, HFpEF, PE on Eliquis , and CKD who presents to the ED with increasing confusion and difficulty breathing over the past 3 days.  Patient's presentation is most consistent with acute presentation with potential threat to life or bodily function.  Differential diagnosis includes, but is not limited to, respiratory failure, CHF exacerbation, COPD, pneumonia, sepsis, UTI, intracranial process, hypercapnia, anemia, electrolyte abnormality, AKI, ACS, PE.  Patient ill-appearing and somnolent at the time my assessment after reportedly being A&O x 2 at the time of arrival.  She does not appear to have any focal neurologic deficits, localizes to pain in all 4 extremities.  EKG appears to show atrial fibrillation with anterolateral T wave inversions, troponin pending.  Renal function is stable compared to previous with mild hypokalemia, she does have elevated bicarb concerning for hypercapnic respiratory failure and we will check VBG.  No significant anemia or leukocytosis noted, BNP within normal limits and chest x-ray is unremarkable.  VBG shows significant hypercapnia, although this appears somewhat compensated with reassuring pH.  However, given patient's significant change in mental status we will trial BiPAP.  Additional labs without significant anemia, leukocytosis, Shuaib abnormality, or AKI.  Lactic acid within normal limits and I doubt sepsis, BP improved following IV fluids.  CT head is negative for acute process, CTA chest is negative for PE or other acute finding.  CT of the abdomen/pelvis does show significantly distended bladder and bladder scan with greater than 1 L of urine.  Foley catheter was placed and urinalysis shows possible UTI, will send for culture and treat with IV Rocephin .  Case discussed with hospitalist for admission.       FINAL CLINICAL IMPRESSION(S) / ED DIAGNOSES   Final diagnoses:  Altered mental status, unspecified altered mental status type  Acute on chronic respiratory failure with hypercapnia (HCC)  Urinary retention     Rx / DC Orders   ED Discharge Orders     None        Note:  This document was prepared using Dragon voice recognition software  and may include unintentional dictation errors.   Willo Dunnings, MD 10/19/23 [phone]

## 2023-10-19 NOTE — Telephone Encounter (Signed)
 Agree with immediate evaluation

## 2023-10-19 NOTE — ED Triage Notes (Signed)
 Patient to ED via [facility] from home for AMS and SOB x3 days. Pt recently dc'd home from rehab after left leg injury requiring surgery in June. Pt bed bound since. Hx CHF. Initially 87% on RA, placed on 4L [place].  Pt alert to place and self. NAD noted.

## 2023-10-20 ENCOUNTER — Inpatient Hospital Stay

## 2023-10-20 DIAGNOSIS — G9340 Encephalopathy, unspecified: Secondary | ICD-10-CM | POA: Diagnosis not present

## 2023-10-20 DIAGNOSIS — R338 Other retention of urine: Secondary | ICD-10-CM | POA: Diagnosis not present

## 2023-10-20 DIAGNOSIS — J9602 Acute respiratory failure with hypercapnia: Secondary | ICD-10-CM | POA: Diagnosis not present

## 2023-10-20 DIAGNOSIS — G9341 Metabolic encephalopathy: Secondary | ICD-10-CM | POA: Diagnosis not present

## 2023-10-20 DIAGNOSIS — J9622 Acute and chronic respiratory failure with hypercapnia: Secondary | ICD-10-CM

## 2023-10-20 DIAGNOSIS — I959 Hypotension, unspecified: Secondary | ICD-10-CM | POA: Diagnosis not present

## 2023-10-20 DIAGNOSIS — R4182 Altered mental status, unspecified: Secondary | ICD-10-CM

## 2023-10-20 DIAGNOSIS — R339 Retention of urine, unspecified: Secondary | ICD-10-CM | POA: Diagnosis not present

## 2023-10-20 DIAGNOSIS — J9601 Acute respiratory failure with hypoxia: Secondary | ICD-10-CM | POA: Diagnosis not present

## 2023-10-20 LAB — BASIC METABOLIC PANEL WITH GFR
Anion gap: 14 (ref 5–15)
BUN: 21 mg/dL (ref 8–23)
CO2: 36 mmol/L — ABNORMAL HIGH (ref 22–32)
Calcium: 9.3 mg/dL (ref 8.9–10.3)
Chloride: 90 mmol/L — ABNORMAL LOW (ref 98–111)
Creatinine, Ser: 1.47 mg/dL — ABNORMAL HIGH (ref 0.44–1.00)
GFR, Estimated: 36 mL/min — ABNORMAL LOW (ref >60)
Glucose, Bld: 114 mg/dL — ABNORMAL HIGH (ref 70–99)
Potassium: 4 mmol/L (ref 3.5–5.1)
Sodium: 140 mmol/L (ref 135–145)

## 2023-10-20 LAB — HEMOGLOBIN A1C
Hgb A1c MFr Bld: 5.1 % (ref 4.8–5.6)
Mean Plasma Glucose: 99.67 mg/dL

## 2023-10-20 LAB — CBC
HCT: 39.7 % (ref 36.0–46.0)
HCT: 38.3 % (ref 36.0–46.0)
Hemoglobin: 12.3 g/dL (ref 12.0–15.0)
Hemoglobin: 11.7 g/dL — ABNORMAL LOW (ref 12.0–15.0)
MCH: 28.3 pg (ref 26.0–34.0)
MCH: 28 pg (ref 26.0–34.0)
MCHC: 31 g/dL (ref 30.0–36.0)
MCHC: 30.5 g/dL (ref 30.0–36.0)
MCV: 91.5 fL (ref 80.0–100.0)
MCV: 91.6 fL (ref 80.0–100.0)
Platelets: 336 K/uL (ref 150–400)
Platelets: 310 K/uL (ref 150–400)
RBC: 4.34 MIL/uL (ref 3.87–5.11)
RBC: 4.18 MIL/uL (ref 3.87–5.11)
RDW: 17.7 % — ABNORMAL HIGH (ref 11.5–15.5)
RDW: 17.9 % — ABNORMAL HIGH (ref 11.5–15.5)
WBC: 9.9 K/uL (ref 4.0–10.5)
WBC: 8.9 K/uL (ref 4.0–10.5)
nRBC: 0 % (ref 0.0–0.2)
nRBC: 0 % (ref 0.0–0.2)

## 2023-10-20 LAB — BLOOD GAS, ARTERIAL
Acid-Base Excess: 13.1 mmol/L — ABNORMAL HIGH (ref 0.0–2.0)
Acid-Base Excess: 13.2 mmol/L — ABNORMAL HIGH (ref 0.0–2.0)
Bicarbonate: 39.6 mmol/L — ABNORMAL HIGH (ref 20.0–28.0)
Bicarbonate: 43.3 mmol/L — ABNORMAL HIGH (ref 20.0–28.0)
Delivery systems: POSITIVE
Delivery systems: POSITIVE
Expiratory PAP: 5 cmH2O
FIO2: 30 %
FIO2: 40 %
Mechanical Rate: 20
O2 Saturation: 98.5 %
O2 Saturation: 98.5 %
PEEP: 5 cmH2O
Patient temperature: 37
Patient temperature: 37
Pressure control: 10 cmH2O
Pressure control: 14 cmH2O
pCO2 arterial: 57 mmHg — ABNORMAL HIGH (ref 32–48)
pCO2 arterial: 84 mmHg (ref 32–48)
pH, Arterial: 7.32 — ABNORMAL LOW (ref 7.35–7.45)
pH, Arterial: 7.45 (ref 7.35–7.45)
pO2, Arterial: 81 mmHg — ABNORMAL LOW (ref 83–108)
pO2, Arterial: 85 mmHg (ref 83–108)

## 2023-10-20 LAB — GLUCOSE, CAPILLARY
Glucose-Capillary: 100 mg/dL — ABNORMAL HIGH (ref 70–99)
Glucose-Capillary: 100 mg/dL — ABNORMAL HIGH (ref 70–99)
Glucose-Capillary: 100 mg/dL — ABNORMAL HIGH (ref 70–99)
Glucose-Capillary: 105 mg/dL — ABNORMAL HIGH (ref 70–99)
Glucose-Capillary: 109 mg/dL — ABNORMAL HIGH (ref 70–99)
Glucose-Capillary: 141 mg/dL — ABNORMAL HIGH (ref 70–99)
Glucose-Capillary: 94 mg/dL (ref 70–99)

## 2023-10-20 LAB — COMPREHENSIVE METABOLIC PANEL WITH GFR
ALT: 12 U/L (ref 0–44)
AST: 20 U/L (ref 15–41)
Albumin: 3.2 g/dL — ABNORMAL LOW (ref 3.5–5.0)
Alkaline Phosphatase: 87 U/L (ref 38–126)
Anion gap: 16 — ABNORMAL HIGH (ref 5–15)
BUN: 20 mg/dL (ref 8–23)
CO2: 35 mmol/L — ABNORMAL HIGH (ref 22–32)
Calcium: 9.3 mg/dL (ref 8.9–10.3)
Chloride: 87 mmol/L — ABNORMAL LOW (ref 98–111)
Creatinine, Ser: 1.42 mg/dL — ABNORMAL HIGH (ref 0.44–1.00)
GFR, Estimated: 37 mL/min — ABNORMAL LOW (ref >60)
Glucose, Bld: 123 mg/dL — ABNORMAL HIGH (ref 70–99)
Potassium: 3.7 mmol/L (ref 3.5–5.1)
Sodium: 138 mmol/L (ref 135–145)
Total Bilirubin: 0.6 mg/dL (ref 0.0–1.2)
Total Protein: 6.2 g/dL — ABNORMAL LOW (ref 6.5–8.1)

## 2023-10-20 LAB — PROCALCITONIN: Procalcitonin: <0.1 ng/mL

## 2023-10-20 LAB — MAGNESIUM: Magnesium: 1.9 mg/dL (ref 1.7–2.4)

## 2023-10-20 LAB — LACTIC ACID, PLASMA
Lactic Acid, Venous: 1.7 mmol/L (ref 0.5–1.9)
Lactic Acid, Venous: 1.4 mmol/L (ref 0.5–1.9)

## 2023-10-20 LAB — MRSA NEXT GEN BY PCR, NASAL: MRSA by PCR Next Gen: NOT DETECTED

## 2023-10-20 LAB — PHOSPHORUS: Phosphorus: 4.3 mg/dL (ref 2.5–4.6)

## 2023-10-20 MED ORDER — CHLORHEXIDINE GLUCONATE CLOTH 2 % EX PADS
6.0000 | MEDICATED_PAD | Freq: Every day | CUTANEOUS | Status: DC
  Administered 2023-10-20 – 2023-10-26 (×7): 6 via TOPICAL

## 2023-10-20 MED ORDER — ACETYLCYSTEINE 20 % IN SOLN
2.0000 mL | Freq: Three times a day (TID) | RESPIRATORY_TRACT | Status: DC
  Administered 2023-10-20 (×2): 2 mL via RESPIRATORY_TRACT
  Filled 2023-10-20 (×2): qty 4

## 2023-10-20 MED ORDER — MIDODRINE HCL 5 MG PO TABS
5.0000 mg | ORAL_TABLET | Freq: Three times a day (TID) | ORAL | Status: DC
  Administered 2023-10-21: 5 mg via ORAL
  Filled 2023-10-20: qty 1

## 2023-10-20 MED ORDER — PREDNISONE 20 MG PO TABS
40.0000 mg | ORAL_TABLET | Freq: Every day | ORAL | Status: DC
  Administered 2023-10-21 – 2023-10-23 (×3): 40 mg via ORAL
  Filled 2023-10-20 (×3): qty 2

## 2023-10-20 MED ORDER — ACETYLCYSTEINE 10% NICU INHALATION SOLUTION: 2.0000 mL | Freq: Three times a day (TID) | RESPIRATORY_TRACT | Status: DC

## 2023-10-20 MED ORDER — SODIUM CHLORIDE 0.9 % IV SOLN
500.0000 mg | INTRAVENOUS | Status: DC
  Administered 2023-10-20: 500 mg via INTRAVENOUS
  Filled 2023-10-20 (×2): qty 5

## 2023-10-20 MED ORDER — INSULIN ASPART 100 UNIT/ML IJ SOLN
0.0000 [IU] | INTRAMUSCULAR | Status: DC
  Administered 2023-10-20 – 2023-10-21 (×2): 2 [IU] via SUBCUTANEOUS
  Administered 2023-10-21 – 2023-10-22 (×3): 3 [IU] via SUBCUTANEOUS
  Administered 2023-10-22: 5 [IU] via SUBCUTANEOUS
  Administered 2023-10-22: 2 [IU] via SUBCUTANEOUS
  Administered 2023-10-23: 3 [IU] via SUBCUTANEOUS
  Filled 2023-10-20 (×8): qty 1

## 2023-10-20 MED ORDER — IPRATROPIUM-ALBUTEROL 0.5-2.5 (3) MG/3ML IN SOLN
3.0000 mL | Freq: Four times a day (QID) | RESPIRATORY_TRACT | Status: DC
  Administered 2023-10-20: 3 mL via RESPIRATORY_TRACT
  Filled 2023-10-20 (×2): qty 3

## 2023-10-20 MED ORDER — ZINC OXIDE 40 % EX OINT
TOPICAL_OINTMENT | Freq: Two times a day (BID) | CUTANEOUS | Status: DC
  Administered 2023-10-22: 1 via TOPICAL
  Filled 2023-10-20: qty 113

## 2023-10-20 NOTE — Consult Note (Addendum)
 WOC Nurse Consult Note: Reason for Consult: l heel wound  Wound type: Unstageable Pressure Injury L heel  Pressure Injury POA: Yes Measurement: see nursing flowsheet  Wound bed: 60% black eschar 40% red  Drainage (amount, consistency, odor) per nursing flowsheet  Periwound: peeling skin  Dressing procedure/placement/frequency:  Cleanse L heel ulcer with Vashe wound cleanser [name]) do not rinse and allow to air dry. Apply xeroform gauze [name]) to wound bed daily, cover with dry gauze and Kerlix roll guaze.  Place L foot in Prevalon boot to offload pressure. [name]  POC discussed with bedside nurse. Patient would benefit from ongoing management of this wound as outpatient by wound care center or podiatry.    WOC team will not follow. Re-consult if further needs arise.   Thank you,    Powell Bar MSN, RN-BC, [employer]

## 2023-10-20 NOTE — Progress Notes (Signed)
 SLP Cancellation Note  Patient Details Name: JOE TANNEY MRN: [id number] DOB: Aug 26, 1945   Cancelled treatment:       Reason Eval/Treat Not Completed: Medical issues which prohibited therapy  Pt currently on BiPAP. Will follow for appropriateness.    Leshay Desaulniers 10/20/2023, 11:02 AM

## 2023-10-20 NOTE — Hospital Course (Signed)
 Stacy Kirk is a 78 y.o. female with medical history significant of dCHF, PE on Eliquis , HTN, DM, hypothyroidism, GI bleeding, CKD-3A, lymphedema, hard of hearing, ovarian cancer, lumbar spinal stenosis, s/p of surgery for left hip fracture 09/09/23, who presents with AMS and SOB.  Patient developed significant respite distress, VBG showed severe CO2 retention, he also had severe hypoxia.  Due to respite distress, she was admitted to the ICU stepdown unit and placed on BiPAP.

## 2023-10-20 NOTE — Progress Notes (Signed)
 Progress Note   Patient: Stacy Kirk FMW:[id number] DOB: [birth year] DOA: 10/19/2023     1 DOS: the patient was seen and examined on 10/20/2023   Brief hospital course: Stacy Kirk is a 78 y.o. female with medical history significant of dCHF, PE on Eliquis , HTN, DM, hypothyroidism, GI bleeding, CKD-3A, lymphedema, hard of hearing, ovarian cancer, lumbar spinal stenosis, s/p of surgery for left hip fracture 09/09/23, who presents with AMS and SOB.  Patient developed significant respite distress, VBG showed severe CO2 retention, he also had severe hypoxia.  Due to respite distress, she was admitted to the ICU stepdown unit and placed on BiPAP.    Principal Problem:   Acute respiratory failure with hypoxia and hypercapnia (HCC) Active Problems:   Acute metabolic encephalopathy   Hypotension   UTI (urinary tract infection)   Acute urinary retention   Chronic diastolic CHF (congestive heart failure) (HCC)   Hypertension   Pulmonary embolism (HCC)   Chronic kidney disease, stage 3a (HCC)   Hypothyroidism   Type II diabetes mellitus with renal manifestations (HCC)   Iron  deficiency anemia   Hypokalemia   Hypomagnesemia   Obesity (BMI 30-39.9)   Assessment and Plan: Acute respiratory failure with hypoxemia and hypercapnia. COPD exacerbation. Acute bronchitis with mucous plug. Patient requiring BiPAP since admission to the hospital, appreciate pulmonology consult. Respiratory failure appears to be due to COPD exacerbation and mucous plug. She had 20 years of smoking history, making COPD highly likely.  CT scan also showed significant mucous plug, this might be due to acute bronchitis.  Procalcitonin level less than 0.1, BNP level normal.  There is no evidence of volume overload. She is treated with BiPAP, scheduled DuoNeb, also added Mucomyst to help clear up the mucus. She is also treated with antibiotics. Obtain speech therapy evaluation to rule out possibility of  aspiration.  Acute metabolic encephalopathy. Possible baseline dementia. Spoke with the patient and husband, patient has some memory defect recently.  Worsening mental status at the time of admission due to severe CO2 retention.  Continue BiPAP.  Monitor patient closely in stepdown unit for another day.  Hypotension. Essential hypertension. Appears to be transient, improved since admission.  Hold off blood pressure medicines.  Chronic diastolic congestive heart failure. No exacerbation at this time.  History of pulmonary emboli. Continue Eliquis .  Chronic kidney disease stage IIIa. Continue to monitor closely.  Acute urinary retention with a possible UTI. Currently covered with Rocephin , culture still pending.     Subjective:  Patient is still requiring BiPAP, but short of breath seems to be better.  Less confused.  Physical Exam: Vitals:   10/20/23 0900 10/20/23 1000 10/20/23 1046 10/20/23 1047  BP: (!) 107/59 128/84    Pulse: 87 90    Resp: 19 20    Temp:      TempSrc:      SpO2: 97% 97% 98% 97%  Weight:      Height:       General exam: Appears calm and comfortable  Respiratory system: Decreased breath sounds.  Respiratory effort normal. Cardiovascular system: S1 & S2 heard, RRR. No JVD, murmurs, rubs, gallops or clicks. No pedal edema. Gastrointestinal system: Abdomen is nondistended, soft and nontender. No organomegaly or masses felt. Normal bowel sounds heard. Central nervous system: Alert and oriented x2. No focal neurological deficits. Extremities: Symmetric 5 x 5 power. Skin: No rashes, lesions or ulcers Psychiatry: Flat affect   Data Reviewed:  Reviewed CT scan results and lab results.  Family Communication: Husband updated over the phone  Disposition: Status is: Inpatient Remains inpatient appropriate because: Severity of disease, IV treatment.  Altered mental status.     Time spent: 55 minutes  Author: Murvin Mana, MD 10/20/2023 10:48  AM  For on call review [email or web address].

## 2023-10-20 NOTE — Progress Notes (Signed)
 eLink Physician-Brief Progress Note Patient Name: IMANE BURROUGH DOB: [birth year] MRN: [id number]   Date of Service  10/20/2023  HPI/Events of Note  Patient admitted with acute on chronic respiratory failure and metabolic encephalopathy, work up in progress.  eICU Interventions  New Patient Evaluation.        Fareeda Downard U Jolyne Laye 10/20/2023, 1:03 AM

## 2023-10-20 NOTE — Plan of Care (Signed)
 Continuing with plan of care.

## 2023-10-20 NOTE — Plan of Care (Signed)

## 2023-10-20 NOTE — Progress Notes (Signed)
 Patient noted to have decreasing urine output and at this time below the hourly amount.  Provider notified and ordered to extend LR 75 for 12 more hours.

## 2023-10-20 NOTE — Consult Note (Signed)
 Name: Stacy Kirk MRN: [id number] DOB: [birth year]    LOS: 1  Referring Provider:  Dr. Hilma Reason for Referral:  Acute hypercarbic respiratory failure  PULMONARY / CRITICAL CARE MEDICINE   Brief patient description: 78 year old female admitted with altered mental status and shortness of breath, found to be in hypercarbic respiratory failure and placed on BiPAP.  VBG indicis worsened and there was concern that patient may need to be intubated hence PCCM was consulted.  Patient transferred to the ICU for further management.Stacy Kirk HPI: This is an 78 year old Caucasian female with a medical history significant for hypertension, type 2 diabetes, hypothyroidism, congestive heart failure, CKD stage III, lumbar spinal stenosis, status post left hip fracture with repair on 09/09/2023 who presented from the SNF with complaints of altered mental status and shortness of breath.  Per EMS, symptoms started several days ago and have progressively gotten worse.  Patient was hypoxic upon EMS arrival with oxygen saturation of 87% and was placed on BiPAP.  History is obtained from ED records as patient is unable to provide much history. Her ED workup showed a pH of 7.36, PCO2 of 92, O2 of 23 on a venous gas.  She was placed on BiPAP.  She was ruled out for PE given her recent HIP surgery.  She was also noted to be hypotensive and given fluid boluses with improvement in blood pressure.  Her repeat renal BG showed worsening CO2 of 100 on BiPAP and there was concern that she was less responsive hence PCCM was consulted. Upon arrival in the ICU, patient was alert and able to follow basic commands.  Repeat ABG showed a pH of 7.32, PCO2 of 84, PO2 of 85, acid base excess of 13.1, bicarb of 43.3 and oxygen saturation of 98%.  Patient was continued on BiPAP and PCCM will be following respiratory status closely.  Past Medical History:  Diagnosis Date   Arthritis    Cancer (HCC)    ovarian, skin face- skin cancer    Complication of anesthesia    slow to awaken everday, even slower with anesthesia   Constipation    Diabetes mellitus    Type II   GERD (gastroesophageal reflux disease)    Headache 05/12/2015   after a fall headache for 8 months   Hemorrhoids    Hernia    History of kidney stones    passed   Hypertension    Hypothyroidism    Loss of appetite    Muscle spasm    back   Osteoporosis    Redness    abd wound   Urine frequency    unable to hold urine    Wears hearing aid    bilateral    Past Surgical History:  Procedure Laterality Date   ABDOMINAL HYSTERECTOMY     APPENDECTOMY     CHOLECYSTECTOMY     COLONOSCOPY W/ POLYPECTOMY     HERNIA REPAIR  Jun2012   open component separation w biologic mesh (Dr. Vanderbilt)   INTRAMEDULLARY (IM) NAIL INTERTROCHANTERIC Left 09/09/2023   Procedure: FIXATION, FRACTURE, INTERTROCHANTERIC, WITH INTRAMEDULLARY ROD;  Surgeon: Lorelle Hussar, MD;  Location: [facility] ORS;  Service: Orthopedics;  Laterality: Left;   TOE SURGERY Left    2nd toe   TONSILLECTOMY      Allergies Allergies  Allergen Reactions   Erythromycin Diarrhea and Other (See Comments)    Tears her stomach up. Major digestive upset.   Tramadol Shortness Of Breath and Other (See Comments)    Felt like  lungs filled up; unable to lay down   Albumin (Human) Other (See Comments)    Pt unsure of reaction   Aspirin  Itching and Other (See Comments)    Caused lethargy, also   Codeine Itching and Other (See Comments)    Cause lethargy, also   Cortisone Other (See Comments)    NO STEROID INJECTIONS; headaches   Linzess  [Linaclotide ] Diarrhea   Propoxyphene Other (See Comments)    Terrible reaction; headaches   Simvastatin Itching and Other (See Comments)    Severe muscle aches   Triamcinolone Acetonide Other (See Comments)    headache    Family History Family History  Problem Relation Age of Onset   Diabetes Mother    Early death Sister    Drug abuse Sister    Social  History  reports that she quit smoking about 25 years ago. Her smoking use included cigarettes. She started smoking about 40 years ago. She has been exposed to tobacco smoke. She has never used smokeless tobacco. She reports that she does not drink alcohol and does not use drugs.  Review Of Systems: Limited due to patient's mental status Review of Systems  Constitutional:  Negative for chills and fever.  Respiratory:  Negative for cough and wheezing.   Cardiovascular:  Negative for chest pain, palpitations and leg swelling.  Gastrointestinal:  Negative for heartburn, nausea and vomiting.  Musculoskeletal:  Positive for joint pain (LEFT HIP) and myalgias.  Neurological:  Negative for dizziness.     VITAL SIGNS: BP 110/74   Pulse 94   Temp 98.1 F (36.7 C) (Oral)   Resp 20   Ht 5' 2 (1.575 m)   Wt 83 kg   SpO2 97%   BMI 33.47 kg/m   HEMODYNAMICS:    VENTILATOR SETTINGS: FiO2 (%):  [30 %-40 %] 30 % PEEP:  [5 cmH20] 5 cmH20 Pressure Support:  [10 cmH20] 10 cmH20  INTAKE / OUTPUT: I/O last 3 completed shifts: In: 814.9 [I.V.:621.1; IV Piggyback:193.9] Out: 280 [Urine:280]  PHYSICAL EXAMINATION: General: Acutely ill looking, in moderate respiratory distress HEENT: PERRLA, trachea midline, no JVD Neuro: Alert and oriented to person and place only, moves all extremities Cardiovascular: Apical pulse Gula, S1-S2, no murmur regurg or gallop, +2 pulses bilaterally Lungs: Increased work of breathing, on BiPAP, bilateral breath sounds, diminished in all lung fields, no wheezing Abdomen: Distended, hypoactive positive bowel sounds in all 4 quadrants, palpation reveals no organomegaly and no tenderness Musculoskeletal: Left leg internally rotated, severe pain with any movement of her left leg, positive range of motion in right lower extremity Skin: Warm and dry, left heel with pressure ulcer   LABS:  BMET Recent Labs  Lab 10/19/23 1434 10/20/23 0047 10/20/23 0315  NA 137 138  140  K 3.0* 3.7 4.0  CL 86* 87* 90*  CO2 38* 35* 36*  BUN 20 20 21   CREATININE 1.26* 1.42* 1.47*  GLUCOSE 127* 123* 114*    Electrolytes Recent Labs  Lab 10/19/23 1434 10/19/23 1658 10/20/23 0047 10/20/23 0315  CALCIUM 9.4  --  9.3 9.3  MG  --  1.5* 1.9  --   PHOS  --  4.2 4.3  --     CBC Recent Labs  Lab 10/19/23 1434 10/20/23 0047 10/20/23 0315  WBC 7.2 9.9 8.9  HGB 12.5 12.3 11.7*  HCT 39.8 39.7 38.3  PLT 365 336 310    Coag's No results for input(s): APTT, INR in the last 168 hours.  Sepsis Markers Recent  Labs  Lab 10/19/23 1905 10/20/23 0047 10/20/23 0315  LATICACIDVEN 1.3 1.7 1.4  PROCALCITON  --  <0.10  --     ABG Recent Labs  Lab 10/19/23 2357  PHART 7.32*  PCO2ART 84*  PO2ART 85    Liver Enzymes Recent Labs  Lab 10/19/23 1658 10/20/23 0047  AST 23 20  ALT 14 12  ALKPHOS 96 87  BILITOT 0.6 0.6  ALBUMIN 3.6 3.2*    Cardiac Enzymes No results for input(s): TROPONINI, PROBNP in the last 168 hours.  Glucose Recent Labs  Lab 10/19/23 2305  GLUCAP 91    Imaging CT ABDOMEN PELVIS W CONTRAST Result Date: 10/19/2023 CLINICAL DATA:  Abdominal pain, acute, nonlocalized EXAM: CT ABDOMEN AND PELVIS WITH CONTRAST TECHNIQUE: Multidetector CT imaging of the abdomen and pelvis was performed using the standard protocol following bolus administration of intravenous contrast. RADIATION DOSE REDUCTION: This exam was performed according to the departmental dose-optimization program which includes automated exposure control, adjustment of the mA and/or kV according to patient size and/or use of iterative reconstruction technique. CONTRAST:  75mL OMNIPAQUE  IOHEXOL  350 MG/ML SOLN COMPARISON:  10/21/2022. FINDINGS: Lower chest: Mild bibasilar linear atelectasis/scarring. Dependent changes at the posterior lung bases. Coronary artery and aortic valve calcifications. Hepatobiliary: No suspicious focal hepatic lesion. Status post cholecystectomy. No  biliary dilatation. Pancreas: Unremarkable. No pancreatic ductal dilatation or surrounding inflammatory changes. Spleen: Normal in size without focal abnormality. Adrenals/Urinary Tract: Chronic 2 cm benign right adrenal nodule, unchanged since 2012, for which no follow-up imaging is recommended. Left adrenal gland is unremarkable. Kidneys enhance symmetrically. No renal or ureteral calculi. The bladder is markedly distended with moderate fullness of the bilateral ureters and collecting systems. Stomach/Bowel: Small to moderate hiatal hernia. Small bowel is grossly unremarkable. No evidence of obstruction or focal inflammatory changes. Descending and sigmoid colonic diverticulosis without evidence of acute diverticulitis. Moderate volume of stool distends the rectum up to 7.5 cm in diameter. Vascular/Lymphatic: Nonaneurysmal abdominal aorta. Extensive calcified and noncalcified atheromatous plaque extending throughout the abdominal aorta and its major branches. Similar bulky marrow plaque or thrombus is again noted along the left proximal abdominal aorta (series 3, image 23). No enlarged abdominal or pelvic lymph nodes. Reproductive: Status post hysterectomy. No adnexal masses. Other: No abdominopelvic ascites.  No intraperitoneal free air. Musculoskeletal: Transitional anatomy, 4 non-rib-bearing lumbar type vertebral bodies. Previously lower lumbar fusion has been designated as L4-L5. Interval progression of superior endplate compression deformity of the L2 vertebral body now demonstrating approximately 45-50% height loss, compared to 30% previously. Status post ORIF of a left proximal femur intertrochanteric fracture. Diffuse osseous demineralization. IMPRESSION: 1. Bladder is markedly distended with moderate fullness of the bilateral ureters and collecting systems. No evidence of renal or ureteral calculi. Recommend correlation for bladder outlet obstruction. 2. Descending sigmoid colonic diverticulosis without  evidence of acute diverticulitis. 3. Moderate volume of stool distending the rectum. 4. Small to moderate hiatal hernia. 5. Interval progression of superior endplate compression deformity of the L2 vertebral body now demonstrating approximately 45-50% height loss, compared to 30% previously. Similar mild retropulsion of the posterosuperior endplate. 6. Status post ORIF of a left proximal femur fracture. 7.  Aortic Atherosclerosis (ICD10-I70.0). Electronically Signed   By: Harrietta Sherry M.D.   On: 10/19/2023 18:57   CT Angio Chest PE W/Cm &/Or Wo Cm Result Date: 10/19/2023 CLINICAL DATA:  Pulmonary embolism (PE) suspected, high prob Shortness of breath. Recently discharged home after rehab from leg injury. EXAM: CT ANGIOGRAPHY CHEST WITH CONTRAST TECHNIQUE:  Multidetector CT imaging of the chest was performed using the standard protocol during bolus administration of intravenous contrast. Multiplanar CT image reconstructions and MIPs were obtained to evaluate the vascular anatomy. RADIATION DOSE REDUCTION: This exam was performed according to the departmental dose-optimization program which includes automated exposure control, adjustment of the mA and/or kV according to patient size and/or use of iterative reconstruction technique. CONTRAST:  75mL OMNIPAQUE  IOHEXOL  350 MG/ML SOLN COMPARISON:  Radiograph earlier today.  Chest CTA 10/04/2022 FINDINGS: Cardiovascular: There are no filling defects within the pulmonary arteries to suggest pulmonary embolus. Mild cardiomegaly. Coronary artery calcifications. Aortic atherosclerosis without aneurysm. No contrast in the aorta and to assess for aortic abnormalities. No pericardial effusion. Mediastinum/Nodes: No enlarged mediastinal or hilar lymph nodes. Moderate-sized hiatal hernia. No esophageal wall thickening. Lungs/Pleura: Mild dependent and perifissural atelectasis within both lungs. No significant pleural effusion. 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 Bronchial thickening in the lower lobes with mucoid impaction on the left. No pleural effusion. Upper Abdomen: Assessed on concurrent abdominopelvic CT, performed concurrently, reported separately. Musculoskeletal: Exaggerated thoracic kyphosis with diffuse thoracic spondylosis. There are no acute or suspicious osseous abnormalities. Review of the MIP images confirms the above findings. IMPRESSION: 1. No pulmonary embolus. 2. Scattered atelectasis. Bronchial thickening in the lower lobes with mucoid impaction on the left. 3. 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 4. Moderate-sized hiatal hernia. Aortic Atherosclerosis (ICD10-I70.0). Electronically Signed   By: Andrea Gasman M.D.   On: 10/19/2023 18:46   CT Head Wo Contrast Result Date: 10/19/2023 CLINICAL DATA:  Mental status change, unknown cause Patient to ED via [facility] from home for AMS and SOB x3 days. Pt recently dc'd home from rehab after left leg injury requiring surgery in June. Pt bed bound since. Hx CHF. Initially 87% on RA, placed on 4L [place]. Pt alert to place and self. NAD note EXAM: CT HEAD WITHOUT CONTRAST TECHNIQUE: Contiguous axial images were obtained from the base of the skull through the vertex without intravenous contrast. RADIATION DOSE REDUCTION: This exam was performed according to the departmental dose-optimization program which includes automated exposure control, adjustment of the mA and/or kV according to patient size and/or use of iterative reconstruction technique. COMPARISON:  CT head 09/09/2023 FINDINGS: Brain: Cerebral ventricle sizes are concordant with the degree of cerebral volume loss. Patchy and confluent areas of decreased attenuation are noted throughout the deep and periventricular white matter of the cerebral hemispheres bilaterally, compatible with chronic microvascular ischemic disease. No evidence of large-territorial acute infarction. No  parenchymal hemorrhage. No mass lesion. No extra-axial collection. No mass effect or midline shift. No hydrocephalus. Basilar cisterns are patent. Vascular: No hyperdense vessel. Atherosclerotic calcifications are present within the cavernous internal carotid and vertebral arteries. Skull: No acute fracture or focal lesion. Sinuses/Orbits: Paranasal sinuses and mastoid air cells are clear. Bilateral lens replacement. Otherwise the orbits are unremarkable. Other: None. IMPRESSION: No acute intracranial abnormality. Electronically Signed   By: Morgane  Naveau M.D.   On: 10/19/2023 18:44   DG Chest 2 View Result Date: 10/19/2023 CLINICAL DATA:  Shortness of breath for 3 days. EXAM: CHEST - 2 VIEW COMPARISON:  September 08, 2023. FINDINGS: Stable cardiomediastinal silhouette. Hypoinflation of the lungs is noted. No significant lung opacities are noted. Bony thorax is unremarkable. IMPRESSION: Hypoinflation of the lungs.  No acute abnormality seen. Electronically Signed   By: Lynwood Landy Raddle M.D.   On: 10/19/2023 16:30   STUDIES:  None  CULTURES: Cultures x 2  ANTIBIOTICS: Ceftriaxone   SIGNIFICANT EVENTS: 10/19/2023 admitted  LINES/TUBES: PIV's  DISCUSSION: 78 year old female presenting with acute on chronic hypercarbic respiratory failure and acute encephalopathy secondary to hypercapnia  ASSESSMENT / PLAN:  PULMONARY A: Acute hypercarbic respiratory failure Acute encephalopathy-likely secondary to hypercapnia, hypotension, acute urinary retention, outflow obstruction and recent surgery P:   -The exact etiology of patient's respiratory failure is unclear.  She does not appear to be in fluid overload and does not have any signs of infection.  Patient's current status does not warrant immediate intubation.  Her repeat ABG is improved.  We will continue her on BiPAP with modifications to her respiratory rate as well as inspiratory pressure.  Continue bronchodilators and expectorants as ordered.   Incentive spirometry once patient's mental status allows.  Will repeat a blood gas in the morning.  Rest of the treatment plan per primary team.  FAMILY UPDATE: No family at bedside.  Will have the longitudinal team updated family after interdisciplinary rounds. Best Practice: Diet/type: N.p.o. for now with sips as tolerated while on BiPAP VTE prophylaxis:  SCD's /apixaban  GI prophylaxis: Not indicated Lines: PIVs Foley: In place and still needed Code Status: Full code Next date of multidisciplinary goals of care discussion 10/20/2023   Caysen Whang S. [facility] ANP-BC Pulmonary and Critical Care Medicine [facility] Pager [phone] or [phone]  NB: This document was prepared using Dragon voice recognition software and may include unintentional dictation errors.    10/20/2023, 7:32 AM

## 2023-10-21 ENCOUNTER — Encounter: Payer: Self-pay | Admitting: Internal Medicine

## 2023-10-21 ENCOUNTER — Inpatient Hospital Stay

## 2023-10-21 DIAGNOSIS — R4182 Altered mental status, unspecified: Secondary | ICD-10-CM

## 2023-10-21 DIAGNOSIS — J9602 Acute respiratory failure with hypercapnia: Secondary | ICD-10-CM | POA: Diagnosis not present

## 2023-10-21 DIAGNOSIS — J9622 Acute and chronic respiratory failure with hypercapnia: Secondary | ICD-10-CM

## 2023-10-21 DIAGNOSIS — Z7189 Other specified counseling: Secondary | ICD-10-CM

## 2023-10-21 DIAGNOSIS — R339 Retention of urine, unspecified: Secondary | ICD-10-CM

## 2023-10-21 DIAGNOSIS — G9341 Metabolic encephalopathy: Secondary | ICD-10-CM | POA: Diagnosis not present

## 2023-10-21 DIAGNOSIS — R338 Other retention of urine: Secondary | ICD-10-CM | POA: Diagnosis not present

## 2023-10-21 DIAGNOSIS — J9601 Acute respiratory failure with hypoxia: Secondary | ICD-10-CM | POA: Diagnosis not present

## 2023-10-21 DIAGNOSIS — Z66 Do not resuscitate: Secondary | ICD-10-CM

## 2023-10-21 DIAGNOSIS — Z515 Encounter for palliative care: Secondary | ICD-10-CM

## 2023-10-21 DIAGNOSIS — Z789 Other specified health status: Secondary | ICD-10-CM

## 2023-10-21 DIAGNOSIS — J441 Chronic obstructive pulmonary disease with (acute) exacerbation: Secondary | ICD-10-CM | POA: Insufficient documentation

## 2023-10-21 LAB — CBC
HCT: 31 % — ABNORMAL LOW (ref 36.0–46.0)
Hemoglobin: 9.6 g/dL — ABNORMAL LOW (ref 12.0–15.0)
MCH: 28.2 pg (ref 26.0–34.0)
MCHC: 31 g/dL (ref 30.0–36.0)
MCV: 91.2 fL (ref 80.0–100.0)
Platelets: 271 K/uL (ref 150–400)
RBC: 3.4 MIL/uL — ABNORMAL LOW (ref 3.87–5.11)
RDW: 18.1 % — ABNORMAL HIGH (ref 11.5–15.5)
WBC: 6.2 K/uL (ref 4.0–10.5)
nRBC: 0 % (ref 0.0–0.2)

## 2023-10-21 LAB — RENAL FUNCTION PANEL
Albumin: 2.6 g/dL — ABNORMAL LOW (ref 3.5–5.0)
Anion gap: 12 (ref 5–15)
BUN: 27 mg/dL — ABNORMAL HIGH (ref 8–23)
CO2: 34 mmol/L — ABNORMAL HIGH (ref 22–32)
Calcium: 8.6 mg/dL — ABNORMAL LOW (ref 8.9–10.3)
Chloride: 89 mmol/L — ABNORMAL LOW (ref 98–111)
Creatinine, Ser: 1.99 mg/dL — ABNORMAL HIGH (ref 0.44–1.00)
GFR, Estimated: 25 mL/min — ABNORMAL LOW (ref >60)
Glucose, Bld: 106 mg/dL — ABNORMAL HIGH (ref 70–99)
Phosphorus: 4.2 mg/dL (ref 2.5–4.6)
Potassium: 3.5 mmol/L (ref 3.5–5.1)
Sodium: 135 mmol/L (ref 135–145)

## 2023-10-21 LAB — GLUCOSE, CAPILLARY
Glucose-Capillary: 71 mg/dL (ref 70–99)
Glucose-Capillary: 89 mg/dL (ref 70–99)
Glucose-Capillary: 117 mg/dL — ABNORMAL HIGH (ref 70–99)
Glucose-Capillary: 128 mg/dL — ABNORMAL HIGH (ref 70–99)
Glucose-Capillary: 144 mg/dL — ABNORMAL HIGH (ref 70–99)
Glucose-Capillary: 194 mg/dL — ABNORMAL HIGH (ref 70–99)
Glucose-Capillary: 151 mg/dL — ABNORMAL HIGH (ref 70–99)

## 2023-10-21 LAB — PROCALCITONIN: Procalcitonin: <0.1 ng/mL

## 2023-10-21 LAB — BRAIN NATRIURETIC PEPTIDE: B Natriuretic Peptide: 98 pg/mL (ref 0.0–100.0)

## 2023-10-21 LAB — MAGNESIUM: Magnesium: 1.9 mg/dL (ref 1.7–2.4)

## 2023-10-21 MED ORDER — LACTATED RINGERS IV SOLN: INTRAVENOUS | Status: DC

## 2023-10-21 MED ORDER — AZITHROMYCIN 250 MG PO TABS
500.0000 mg | ORAL_TABLET | Freq: Every day | ORAL | Status: DC
  Administered 2023-10-21 – 2023-10-23 (×3): 500 mg via ORAL
  Filled 2023-10-21 (×3): qty 2

## 2023-10-21 MED ORDER — SENNOSIDES-DOCUSATE SODIUM 8.6-50 MG PO TABS
2.0000 | ORAL_TABLET | Freq: Two times a day (BID) | ORAL | Status: DC
  Administered 2023-10-21 – 2023-10-27 (×12): 2 via ORAL
  Filled 2023-10-21 (×13): qty 2

## 2023-10-21 MED ORDER — LACTULOSE 10 GM/15ML PO SOLN
20.0000 g | Freq: Once | ORAL | Status: AC
  Administered 2023-10-21: 20 g via ORAL
  Filled 2023-10-21: qty 30

## 2023-10-21 MED ORDER — SODIUM CHLORIDE 0.9 % IV SOLN
1.0000 g | INTRAVENOUS | Status: DC
  Administered 2023-10-21 – 2023-10-22 (×2): 1 g via INTRAVENOUS
  Filled 2023-10-21 (×3): qty 10

## 2023-10-21 NOTE — Evaluation (Signed)
 Clinical/Bedside Swallow Evaluation Patient Details  Name: Stacy Kirk MRN: [id number] Date of Birth: 24-Sep-1945  Today's Date: 10/21/2023 Time: SLP Start Time (ACUTE ONLY): 1000 SLP Stop Time (ACUTE ONLY): 1015 SLP Time Calculation (min) (ACUTE ONLY): 15 min  Past Medical History:  Past Medical History:  Diagnosis Date   Arthritis    Cancer (HCC)    ovarian, skin face- skin cancer   Complication of anesthesia    slow to awaken everday, even slower with anesthesia   Constipation    Diabetes mellitus    Type II   GERD (gastroesophageal reflux disease)    Headache 05/12/2015   after a fall headache for 8 months   Hemorrhoids    Hernia    History of kidney stones    passed   Hypertension    Hypothyroidism    Loss of appetite    Muscle spasm    back   Osteoporosis    Redness    abd wound   Urine frequency    unable to hold urine    Wears hearing aid    bilateral    Past Surgical History:  Past Surgical History:  Procedure Laterality Date   ABDOMINAL HYSTERECTOMY     APPENDECTOMY     CHOLECYSTECTOMY     COLONOSCOPY W/ POLYPECTOMY     HERNIA REPAIR  Jun2012   open component separation w biologic mesh (Dr. Vanderbilt)   INTRAMEDULLARY (IM) NAIL INTERTROCHANTERIC Left 09/09/2023   Procedure: FIXATION, FRACTURE, INTERTROCHANTERIC, WITH INTRAMEDULLARY ROD;  Surgeon: Lorelle Hussar, MD;  Location: [facility] ORS;  Service: Orthopedics;  Laterality: Left;   TOE SURGERY Left    2nd toe   TONSILLECTOMY     HPI:  Per H&P, Stacy Kirk is a 78 y.o. female with medical history significant of dCHF, PE on Eliquis , HTN, DM, hypothyroidism, GI bleeding, CKD-3A, lymphedema, hard of hearing, ovarian cancer, lumbar spinal stenosis, s/p of surgery for left hip fracture 09/09/23, who presents with AMS and SOB.     Patient has AMS, and is unable to provide accurate medical history, therefore, most of the history is obtained by discussing the case with ED physician, per EMS report,  and with the nursing staff. Per report, pt has SOB in the past several days and has been confused.  Patient was found to have acute respiratory distress with oxygen desaturation to 87%, requiring BiPAP.  No active nausea vomiting, diarrhea noted.  Not sure if patient has symptoms of UTI.  Temperature normal 98.2 in ED. DG Chest 7/24: Hypoinflation of the lungs.  No acute abnormality seen. Repeat DG Chest completed this AM- no results yet    Assessment / Plan / Recommendation  Clinical Impression  Pt seen for bedside swallow assessment in the setting of concern for aspiration/dysphagia. Pt seen with trials of thin liquids (via straw) and regular solids. No overt or subtle s/sx pharyngeal dysphagia noted. No change to vocal quality across trials. Vitals stable for duration of trials (96 and greater). Oral phase slightly prolonged for mastication and clearance of regular solids- impacted by fatigue and reduced attention to task of eating.   Pt denied history of PNA, GERD, or dysphagia. Based current debility, pt is at min increased risk of aspiration, therefore recommend aspiration precautions (slow rate, small bites, elevated HOB, and alert for PO intake). Continue with current Dys 2 diet with thin liquids. Pt with potential for advancement in diet with increased endurance, as medically warranted. MD and RN aware of recommendations. No  further acute SLP services indicated at time.   SLP Visit Diagnosis: Dysphagia, unspecified (R13.10)    Aspiration Risk  Mild aspiration risk    Diet Recommendation   Dysphagia 2 (chopped);Thin  Medication Administration: Whole meds with puree (vs with thin liquids)    Other  Recommendations Oral Care Recommendations: Oral care BID     Assistance Recommended at Discharge  Int supervision for PO intake.   Functional Status Assessment Patient has not had a recent decline in their functional status    Swallow Study   General Date of Onset: 10/21/23 HPI: Per H&P,  Stacy Kirk is a 78 y.o. female with medical history significant of dCHF, PE on Eliquis , HTN, DM, hypothyroidism, GI bleeding, CKD-3A, lymphedema, hard of hearing, ovarian cancer, lumbar spinal stenosis, s/p of surgery for left hip fracture 09/09/23, who presents with AMS and SOB.     Patient has AMS, and is unable to provide accurate medical history, therefore, most of the history is obtained by discussing the case with ED physician, per EMS report, and with the nursing staff. Per report, pt has SOB in the past several days and has been confused.  Patient was found to have acute respiratory distress with oxygen desaturation to 87%, requiring BiPAP.  No active nausea vomiting, diarrhea noted.  Not sure if patient has symptoms of UTI.  Temperature normal 98.2 in ED. DG Chest 7/24: Hypoinflation of the lungs.  No acute abnormality seen. Repeat DG Chest completed this AM- no results yet Type of Study: Bedside Swallow Evaluation Previous Swallow Assessment: none in chart Diet Prior to this Study: Thin liquids (Level 0);Dysphagia 2 (finely chopped) Temperature Spikes Noted: No Respiratory Status: Nasal cannula (2L) History of Recent Intubation: No Behavior/Cognition: Lethargic/Drowsy;Requires cueing Oral Cavity Assessment: Within Functional Limits Oral Care Completed by SLP: Recent completion by staff Oral Cavity - Dentition: Missing dentition Vision: Functional for self-feeding Self-Feeding Abilities: Able to feed self Patient Positioning: Upright in bed Baseline Vocal Quality: Normal Volitional Cough: Strong Volitional Swallow: Able to elicit    Oral/Motor/Sensory Function Overall Oral Motor/Sensory Function: Within functional limits   Ice Chips Ice chips: Not tested   Thin Liquid Thin Liquid: Within functional limits Presentation: Straw    Nectar Thick Nectar Thick Liquid: Not tested   Honey Thick Honey Thick Liquid: Not tested   Puree Puree: Not tested (pt refused)   Solid      Solid: Within functional limits Presentation: Self Fed     [place] Axyl Sitzman Clapp, MS, CCC-SLP Speech Language Pathologist Rehab Services; [facility] -  [phone] (ascom)   [place] J Clapp 10/21/2023,12:05 PM

## 2023-10-21 NOTE — Progress Notes (Signed)
 Patient refusing to allow staff to reapply telemetry leads.  Notified Dr. Laurita, order to discontinue at this time.

## 2023-10-21 NOTE — Progress Notes (Signed)
 Progress Note   Patient: Stacy Kirk FMW:[id number] DOB: [birth year] DOA: 10/19/2023     2 DOS: the patient was seen and examined on 10/21/2023   Brief hospital course: TRAVIS PURK is a 78 y.o. female with medical history significant of dCHF, PE on Eliquis , HTN, DM, hypothyroidism, GI bleeding, CKD-3A, lymphedema, hard of hearing, ovarian cancer, lumbar spinal stenosis, s/p of surgery for left hip fracture 09/09/23, who presents with AMS and SOB.  Patient developed significant respite distress, VBG showed severe CO2 retention, he also had severe hypoxia.  Due to respite distress, she was admitted to the ICU stepdown unit and placed on BiPAP.    Principal Problem:   Acute respiratory failure with hypoxia and hypercapnia (HCC) Active Problems:   Acute metabolic encephalopathy   Hypotension   UTI (urinary tract infection)   Acute urinary retention   Chronic diastolic CHF (congestive heart failure) (HCC)   Hypertension   Pulmonary embolism (HCC)   Chronic kidney disease, stage 3a (HCC)   Hypothyroidism   Type II diabetes mellitus with renal manifestations (HCC)   Iron  deficiency anemia   Hypokalemia   Hypomagnesemia   Obesity (BMI 30-39.9)   Altered mental status   Acute on chronic respiratory failure with hypercapnia (HCC)   Assessment and Plan:  Acute respiratory failure with hypoxemia and hypercapnia. COPD exacerbation. Acute bronchitis with mucous plug. Patient requiring BiPAP since admission to the hospital, appreciate pulmonology consult. Respiratory failure appears to be due to COPD exacerbation and mucous plug. She had 20 years of smoking history, making COPD highly likely.  CT scan also showed significant mucous plug, this might be due to acute bronchitis.  Procalcitonin level less than 0.1, BNP level normal.  There is no evidence of volume overload. She is treated with BiPAP, scheduled DuoNeb, oral prednisone .  Patient could not tolerate Mucomyst .  Oxygenation  is better today from she has been off BiPAP, on 2 L oxygen.  Oxygen saturation better. Will continue Zithromax .  Rocephin  was started for possible UTI.  Will continue. Patient respiratory status has improved, will transfer out to medical floor. Patient has crackles in the right base, repeat chest x-ray does not seem to be worse.  BNP and procalcitonin level still normal.   Acute metabolic encephalopathy. Possible baseline dementia. Spoke with the patient and husband, patient has some memory defect recently.  Worsening mental status at the time of admission due to severe CO2 retention.  Mental status has improved.   Hypotension. Essential hypertension. Hypotension resolved, discontinue midodrine . Blood continue to hold blood pressure medicine.   Chronic diastolic congestive heart failure. No exacerbation at this time.   History of pulmonary emboli. Continue Eliquis .   Chronic kidney disease stage IIIa. Patient has decreased urine output, renal function is getting worse, but does not meet criteria for acute kidney injury.  Patient currently is not volume overloaded, I will increase IV fluids dose.  Check renal ultrasound.  Continue to monitor closely, if renal function continues get worse, will obtain nephrology consult.   Acute urinary retention with a possible UTI. Currently covered with Rocephin , urine culture still pending.  Constipation. KUB has increased stool burden, will give lactulose  and senna.   Class I obesity with BMI 34.44. Diet and excise.     Subjective:  Short of breath is better today, cough, nonproductive.  Currently has no dysuria.  Physical Exam: Vitals:   10/21/23 0415 10/21/23 0500 10/21/23 0600 10/21/23 0800  BP: 131/89 112/61 101/72 96/70  Pulse: (!) 112  86 75 70  Resp: 16 17 16 18   Temp:    98.4 F (36.9 C)  TempSrc:    Oral  SpO2: 90% 90% 98% 97%  Weight:  85.4 kg    Height:       General exam: Appears calm and comfortable  Respiratory  system: Crackles in the right base. Respiratory effort normal. Cardiovascular system: S1 & S2 heard, RRR. No JVD, murmurs, rubs, gallops or clicks. No pedal edema. Gastrointestinal system: Abdomen is nondistended, soft and nontender. No organomegaly or masses felt. Normal bowel sounds heard. Central nervous system: Alert and oriented x3. No focal neurological deficits. Extremities: Symmetric 5 x 5 power. Skin: No rashes, lesions or ulcers Psychiatry:  Mood & affect appropriate.    Data Reviewed:  Reviewed chest x-ray image, abdominal x-ray report, lab results.  Family Communication: Husband updated over the phone  Disposition: Status is: Inpatient Remains inpatient appropriate because: Severity of disease, IV treatment.     Time spent: 55 minutes  Author: Murvin Mana, MD 10/21/2023 10:56 AM  For on call review [email or web address].

## 2023-10-21 NOTE — Consult Note (Cosign Needed Addendum)
 Consultation Note Date: 10/21/2023 at 1630  Patient Name: Stacy Kirk  DOB: November 24, 1945  MRN: [id number]  Age / Sex: 78 y.o., female  PCP: Wendee Lynwood HERO, NP Referring Physician: Laurita Pillion, MD  HPI/Patient Profile: 78 y.o. female  with past medical history significant for dCHF, PE on Eliquis , HTN, DM, hypothyroidism ism, GI bleeding, CKD-3 AA, lymphedema, HOH, ovarian cancer, lumbar spinal stenosis and bedbound status. Patient presented to ED 10/19/2023 from home via EMS c/o AMS and shortness of breath x 3 days.  Patient found to be hypoxic at 87% on room air by EMS and placed on 4L nasal cannula.  ED labs showed WBC 7.2, K+ 3.0, mag 1.5, troponin 16 => 13, lactic 0.9 => 1.3, UA showed small amount leukocyte with rare bacteria and WBC 6-10.  VBG 1707.36, CO2 92, O2 43.  Repeat VBG 2155 pH 7.33, CO2 100, O2 16.  CXR negative.  CT head negative for acute intracranial abnormalities.  CTA negative for PE.  CTAP showed bladder distention and possible bladder outlet obstruction.  Patient was admitted to SDU for management of acute hypercarbic respiratory failure, acute encephalopathy possibly 2/2 hypercapnia, hypotension, acute recurrent urinary retention and outflow obstruction.  Of note, patient was recently discharged to SNF rehab 09/14/2023 for close left hip fracture status post left hip intramedullary nail.  PMT was consulted for assistance with goals of care conversations.  Clinical Assessment and Goals of Care:  Elderly, ill-appearing female resting in bed.  She awakens to gentle verbal stimuli.  She is alert and able to participate in conversation but is confused.  She is oriented to DOB, able to name her husband but unable to correctly state year, location or situation.  Even, unlabored respirations.  She is in no distress.  Patient denies pain.  States she is sleeping well since she has been here.   Denies shortness of breath or chest pain.  States she is eating some when the food is good.  Extensive chart review completed prior to meeting patient including labs, vital signs, imaging, progress notes, orders, and available advanced directive documents from current and previous encounters. I then met with patient at bedside and spoke with her husband and via phone to discuss diagnosis prognosis, GOC, EOL wishes, disposition and options.  I introduced Palliative Medicine as specialized medical care for people living with serious illness. It focuses on providing relief from the symptoms and stress of a serious illness. The goal is to improve quality of life for both the patient and the family.  We discussed a brief life review of the patient.  Kayla, husband, and patient have been married approximately 45 years.  Kayla and his wife live in their home with their disabled son, Carliss.  He is the primary caretaker for his wife and son.  Powell, daughter, lives out of town.  They have no grandchildren.  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  As far as functional and nutritional status husband  shares prior to left hip fracture June 2025 patient was ambulatory and will to perform her ADLs.  After being discharged from rehab facility patient patient became bedbound with Kayla has been performing all of her ADLs.  Prior to most recent hospitalization, patient has been sleeping more and had poor appetite only picking at her food.  Kayla shares that he had hired [facility] prior to this hospitalization to assist with care of his wife.  He was paying out-of-pocket for 7-day a week care.  We discussed patient's current illness and what it means in the larger context of patient's on-going co-morbidities.  Natural disease trajectory and expectations at EOL were discussed.  Kayla verbalizes understanding that his wife is showing overall decline since  hip fracture in June. He understands patient has slightly worsening renal function as well.  He shares that his wife would not want dialysis.  I attempted to elicit values and goals of care important to the patient.  Ms. Mathes shares her most important goal is going back home. He shares concern about left foot deformity and pain with inability to bear weight since 2023.  Due to inability to stand on left foot, he questions rehab facility and does not feel this would benefit his wife.  The difference between aggressive medical intervention and comfort care was considered in light of the patient's goals of care.   Advance directives, concepts specific to code status, artificial feeding and hydration, and rehospitalization were considered and discussed.  Kayla does not want his wife to have CPR or breathing machine stating that is no way to live and that is not what she would want.  Confirmed he wants DNR/DNI status for his wife.  In the event patient were to have respiratory distress, Kayla [place] not wanting his wife to be on a breathing machine per her wishes from previous conversations.  Education offered regarding concept specific to human mortality and the limitations of medical interventions to prolong life when the body begins to fail to thrive.  Family is facing treatment option decisions, advanced directive, and anticipatory care needs.    Discussed with patient/family the importance of continued conversation with family and the medical providers regarding overall plan of care and treatment options, ensuring decisions are within the context of the patient's values and GOCs.    Hospice and Palliative Care services outpatient were explained and offered.  Kayla is not sure at this time what direction to go.  He wants to allow time for outcomes with current medical interventions.  Wants his wife is medically stable for discharge, he will reconsider rehab placement versus home health.  He  will also consider hospice in the home.  Questions and concerns were addressed. The family was encouraged to call with questions or concerns.   Primary Decision Maker NEXT OF KIN-[name] Avery, spouse  Physical Exam Vitals reviewed.  Constitutional:      General: She is not in acute distress.    Appearance: She is ill-appearing. She is not toxic-appearing.  HENT:     Head: Normocephalic and atraumatic.     Mouth/Throat:     Mouth: Mucous membranes are dry.  Pulmonary:     Effort: Pulmonary effort is normal. No respiratory distress.     Comments: 2 L O2 by [place] Abdominal:     General: There is no distension.     Palpations: Abdomen is soft.     Tenderness: There is no abdominal tenderness. There is no guarding.  Musculoskeletal:  Right lower leg: No edema.     Left lower leg: No edema.  Skin:    General: Skin is warm and dry.     Comments: Unna boot to left foot  Neurological:     Mental Status: She is alert. She is disoriented.    Recommendations/Plan: DNR/DNI Continue current supportive interventions PMT to continue goals of care conversations with husband Disposition unclear at this time  Palliative Assessment/Data: 20-30%     Thank you for this consult. Palliative medicine will continue to follow and assist holistically.   Time Total: 90 minutes  Time spent includes: Detailed review of medical records (labs, imaging, vital signs), medically appropriate exam (mental status, respiratory, cardiac, skin), discussed with treatment team, counseling and educating patient, family and staff, documenting clinical information, medication management and coordination of care.     Devere Sacks, ELNITA- [facility] Palliative Medicine Team  10/21/2023 6:16 PM  Office [phone]  Pager [phone]     Please contact Palliative Medicine Team providers via AMION for questions and concerns.

## 2023-10-21 NOTE — TOC Initial Note (Signed)
 Transition of Care [facility]) - Initial/Assessment Note    Patient Details  Name: Stacy Kirk MRN: [id number] Date of Birth: [birth year]  Transition of [facility] CM/SW Contact:    [place] L Zayonna Ayuso, LCSW Phone Number: 10/21/2023, 3:56 PM  Clinical Narrative:                  CSW spoke with Mr. Czaplicki regarding discharge planning. Mr. Delia declined the need for his wife to be placed. He advised that he wants her to return to the home. He stated that he spoke with the Occupational Therapist and expressed concerns about his wife's decline. He stated that PCA services come into the home to assist and he finds it very helpful. He advised that if more services were in the home to assist it would make him feel better.          Patient Goals and CMS Choice            Expected Discharge Plan and Services                                              Prior Living Arrangements/Services                       Activities of Daily Living      Permission Sought/Granted                  Emotional Assessment              Admission diagnosis:  Urinary retention [R33.9] Acute on chronic respiratory failure with hypercapnia (HCC) [J96.22] Acute respiratory failure with hypoxia and hypercapnia (HCC) [J96.01, J96.02] Altered mental status, unspecified altered mental status type [R41.82] Acute respiratory failure, unsp w hypoxia or hypercapnia (HCC) [J96.00] Patient Active Problem List   Diagnosis Date Noted   COPD with acute exacerbation (HCC) 10/21/2023   Altered mental status 10/20/2023   Acute on chronic respiratory failure with hypercapnia (HCC) 10/20/2023   Acute respiratory failure with hypoxia and hypercapnia (HCC) 10/19/2023   UTI (urinary tract infection) 10/19/2023   Acute metabolic encephalopathy 10/19/2023   Hypotension 10/19/2023   Iron  deficiency anemia 10/19/2023   Hypomagnesemia 10/19/2023   Acute urinary retention 10/19/2023    Closed left hip fracture, initial encounter (HCC) 09/09/2023   Fall at home, initial encounter 09/09/2023   Type II diabetes mellitus with renal manifestations (HCC) 09/09/2023   Chronic kidney disease, stage 3a (HCC) 09/09/2023   Chronic diastolic CHF (congestive heart failure) (HCC) 09/09/2023   Obesity (BMI 30-39.9) 09/09/2023   Closed left hip fracture (HCC) 09/09/2023   Closed comminuted intertrochanteric fracture of proximal end of left femur (HCC) 09/09/2023   Adventitious breath sounds 06/30/2023   Acute cough 06/30/2023   PND (post-nasal drip) 06/07/2023   Abnormal urinalysis 06/07/2023   Incomplete bladder emptying 05/30/2023   Hematoma 05/30/2023   Chills 05/30/2023   Epistaxis 05/12/2023   Visit for suture removal 05/12/2023   Lymphedema 04/14/2023   Acute non-recurrent pansinusitis 03/03/2023   Pulmonary embolism (HCC) 12/09/2022   Hospital discharge follow-up 11/29/2022   Bilateral lower extremity edema 11/29/2022   Left leg weakness 11/29/2022   Rectal bleed 11/23/2022   Lower GI bleed 11/22/2022   Chronic systolic heart failure (HCC) 11/22/2022   GI bleed 11/22/2022   BRBPR (bright red blood per rectum) 11/22/2022  Lumbar compression fracture (HCC) 10/02/2022   Closed compression fracture of L2 lumbar vertebra, initial encounter (HCC) 09/30/2022   Hypokalemia 09/30/2022   Hypertension 09/30/2022   Controlled type 2 diabetes mellitus with complication, without long-term current use of insulin  (HCC) 08/05/2022   Chronic constipation 08/05/2022   OAB (overactive bladder) 08/05/2022   Hypothyroidism 08/05/2022   Fatigue 08/05/2022   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-L5 level 09/07/2016   Abdominal pain, RLQ 01/24/2011   Abdominal right lower quadrant swelling 01/24/2011   Low back pain 01/24/2011   Knee pain, right 01/24/2011   PCP:  Wendee Lynwood HERO, NP Pharmacy:   [facility] [phone] - [address] [place] - [address] [place] [address] Phone:  [phone] Fax: [phone]     Social Drivers of Health (SDOH) Social History: SDOH Screenings   Food Insecurity: No Food Insecurity (10/20/2023)  Recent Concern: Food Insecurity - Food Insecurity Present (09/27/2023)   Received from [facility]  Housing: Low Risk  (10/20/2023)  Transportation Needs: No Transportation Needs (10/20/2023)  Utilities: Not At Risk (10/20/2023)  Financial Resource Strain: Medium Risk (09/27/2023)   Received from [facility]  Social Connections: Unknown (10/20/2023)  Tobacco Use: Medium Risk (10/19/2023)   SDOH Interventions:     Readmission Risk Interventions     No data to display

## 2023-10-21 NOTE — Care Management Important Message (Signed)
 Important Message  Patient Details  Name: Stacy Kirk MRN: [id number] Date of Birth: [birth year]   Important Message Given:  Yes - Medicare IM     Stacy Kirk 10/21/2023, 9:36 AM

## 2023-10-21 NOTE — Evaluation (Signed)
 Occupational Therapy Evaluation Patient Details Name: Stacy Kirk MRN: [id number] DOB: 04/02/1945 Today's Date: 10/21/2023   History of Present Illness   78 y.o. female with medical history significant of dCHF, PE on Eliquis , HTN, DM, hypothyroidism, GI bleeding, CKD-3A, lymphedema, hard of hearing, ovarian cancer, lumbar spinal stenosis, s/p of surgery for left hip fracture 09/09/23, who presents with AMS and SOB.     Clinical Impressions Patient presenting with decreased Ind in self care,balance, functional mobility/transfers, endurance, and safety awareness. Patient is poor historian and reports living at home with her parents and siblings and that it was December. OT called and spoke to her husband, howard, who reports she has been home with him since being discharged from rehab. He states she has been somewhat bed bound and that she has had a PCA come from 9:30-12:30 and 3:30-6:30 to care for her. He cares for their adult child with special needs. This session the patient was very agitated when therapist attempted to roll her, touch LEs, and place prevalon boots on B feet. She screams at therapist not to do any of these things and take boots off. Unable to attempt EOB secondary to agitation. Patient will benefit from acute OT to increase overall independence in the areas of ADLs, functional mobility, and safety awareness in order to safely discharge.     If plan is discharge home, recommend the following:   Two people to help with walking and/or transfers;Two people to help with bathing/dressing/bathroom;Supervision due to cognitive status     Functional Status Assessment   Patient has had a recent decline in their functional status and demonstrates the ability to make significant improvements in function in a reasonable and predictable amount of time.     Equipment Recommendations   Hospital bed      Precautions/Restrictions   Precautions Precautions: Fall      Mobility Bed Mobility Overal bed mobility: Needs Assistance Bed Mobility: Rolling Rolling: Total assist              Transfers                   General transfer comment: unable to attempt secondary to agitiation          ADL either performed or assessed with clinical judgement   ADL Overall ADL's : Needs assistance/impaired                                       General ADL Comments: total A     Vision Patient Visual Report: No change from baseline              Pertinent Vitals/Pain Pain Assessment Pain Assessment: Faces Faces Pain Scale: Hurts whole lot Pain Location: B LEs Pain Descriptors / Indicators: Discomfort Pain Intervention(s): Monitored during session, Repositioned     Extremity/Trunk Assessment Upper Extremity Assessment Upper Extremity Assessment: Generalized weakness;Difficult to assess due to impaired cognition   Lower Extremity Assessment Lower Extremity Assessment: Generalized weakness;Difficult to assess due to impaired cognition       Communication Communication Communication: No apparent difficulties   Cognition Arousal: Alert Behavior During Therapy: Agitated Cognition: History of cognitive impairments             OT - Cognition Comments: cognition is much worse than baseline at this time per husband. Pt reported she lives with parents and siblings.  Following commands: Intact       Cueing  General Comments   Cueing Techniques: Verbal cues              Home Living Family/patient expects to be discharged to:: Private residence Living Arrangements: Spouse/significant other Available Help at Discharge: Family;Available PRN/intermittently;Personal care attendant Type of Home: House Home Access: Ramped entrance     Home Layout: One level     Bathroom Shower/Tub: Producer, television/film/video: Handicapped height     Home Equipment: Cane - single  point;Grab bars - toilet;Grab bars - tub/shower;Rolling Walker (2 wheels)          Prior Functioning/Environment Prior Level of Function : Needs assist               ADLs Comments: Pt has been bed bound per husband since returning home from rehab and has had PCA coming from 9:30-12:30 and 3:30-6:30    OT Problem List: Decreased strength;Impaired balance (sitting and/or standing);Decreased cognition;Decreased knowledge of precautions;Pain;Decreased safety awareness;Decreased activity tolerance;Decreased knowledge of use of DME or AE   OT Treatment/Interventions: Self-care/ADL training;Manual therapy;Therapeutic exercise;Patient/family education;Balance training;Energy conservation;Therapeutic activities;DME and/or AE instruction      OT Goals(Current goals can be found in the care plan section)   Acute Rehab OT Goals Patient Stated Goal: to get stronger OT Goal Formulation: With family Time For Goal Achievement: 11/04/23 Potential to Achieve Goals: Fair ADL Goals Pt Will Transfer to Toilet: with mod assist;bedside commode Pt Will Perform Toileting - Clothing Manipulation and hygiene: with mod assist;sit to/from stand Pt/caregiver will Perform Home Exercise Program: Both right and left upper extremity;Increased ROM;Increased strength;With theraband;With Supervision;With written HEP provided   OT Frequency:  Min 1X/week (Pt placed on trial)       AM-PAC OT 6 Clicks Daily Activity     Outcome Measure Help from another person eating meals?: None Help from another person taking care of personal grooming?: A Little Help from another person toileting, which includes using toliet, bedpan, or urinal?: Total Help from another person bathing (including washing, rinsing, drying)?: Total Help from another person to put on and taking off regular upper body clothing?: A Lot Help from another person to put on and taking off regular lower body clothing?: Total 6 Click Score: 12   End  of Session    Activity Tolerance: Treatment limited secondary to agitation Patient left: in bed;with call bell/phone within reach;with bed alarm set  OT Visit Diagnosis: Unsteadiness on feet (R26.81);Repeated falls (R29.6);Muscle weakness (generalized) (M62.81);History of falling (Z91.81)                Time: 8590-8563 OT Time Calculation (min): 27 min Charges:  OT General Charges $OT Visit: 1 Visit OT Evaluation $OT Eval Moderate Complexity: 1 Mod OT Treatments $Therapeutic Activity: 8-22 mins  Izetta Claude, MS, OTR/L , CBIS ascom [phone]  10/21/23, 3:42 PM

## 2023-10-22 DIAGNOSIS — J9602 Acute respiratory failure with hypercapnia: Secondary | ICD-10-CM | POA: Diagnosis not present

## 2023-10-22 DIAGNOSIS — J9601 Acute respiratory failure with hypoxia: Secondary | ICD-10-CM | POA: Diagnosis not present

## 2023-10-22 DIAGNOSIS — Z7189 Other specified counseling: Secondary | ICD-10-CM

## 2023-10-22 DIAGNOSIS — J441 Chronic obstructive pulmonary disease with (acute) exacerbation: Secondary | ICD-10-CM

## 2023-10-22 DIAGNOSIS — G9341 Metabolic encephalopathy: Secondary | ICD-10-CM | POA: Diagnosis not present

## 2023-10-22 DIAGNOSIS — R4182 Altered mental status, unspecified: Secondary | ICD-10-CM

## 2023-10-22 DIAGNOSIS — Z789 Other specified health status: Secondary | ICD-10-CM

## 2023-10-22 DIAGNOSIS — R339 Retention of urine, unspecified: Secondary | ICD-10-CM

## 2023-10-22 DIAGNOSIS — Z66 Do not resuscitate: Secondary | ICD-10-CM

## 2023-10-22 DIAGNOSIS — J9622 Acute and chronic respiratory failure with hypercapnia: Secondary | ICD-10-CM

## 2023-10-22 DIAGNOSIS — Z515 Encounter for palliative care: Secondary | ICD-10-CM

## 2023-10-22 DIAGNOSIS — N3000 Acute cystitis without hematuria: Secondary | ICD-10-CM | POA: Diagnosis not present

## 2023-10-22 LAB — GLUCOSE, CAPILLARY
Glucose-Capillary: 109 mg/dL — ABNORMAL HIGH (ref 70–99)
Glucose-Capillary: 115 mg/dL — ABNORMAL HIGH (ref 70–99)
Glucose-Capillary: 119 mg/dL — ABNORMAL HIGH (ref 70–99)
Glucose-Capillary: 137 mg/dL — ABNORMAL HIGH (ref 70–99)
Glucose-Capillary: 183 mg/dL — ABNORMAL HIGH (ref 70–99)
Glucose-Capillary: 220 mg/dL — ABNORMAL HIGH (ref 70–99)

## 2023-10-22 LAB — RENAL FUNCTION PANEL
Albumin: 2.6 g/dL — ABNORMAL LOW (ref 3.5–5.0)
Anion gap: 13 (ref 5–15)
BUN: 27 mg/dL — ABNORMAL HIGH (ref 8–23)
CO2: 30 mmol/L (ref 22–32)
Calcium: 8.8 mg/dL — ABNORMAL LOW (ref 8.9–10.3)
Chloride: 92 mmol/L — ABNORMAL LOW (ref 98–111)
Creatinine, Ser: 1.7 mg/dL — ABNORMAL HIGH (ref 0.44–1.00)
GFR, Estimated: 30 mL/min — ABNORMAL LOW (ref >60)
Glucose, Bld: 122 mg/dL — ABNORMAL HIGH (ref 70–99)
Phosphorus: 3.9 mg/dL (ref 2.5–4.6)
Potassium: 4.1 mmol/L (ref 3.5–5.1)
Sodium: 135 mmol/L (ref 135–145)

## 2023-10-22 LAB — CBC
HCT: 30.6 % — ABNORMAL LOW (ref 36.0–46.0)
Hemoglobin: 9.7 g/dL — ABNORMAL LOW (ref 12.0–15.0)
MCH: 28.4 pg (ref 26.0–34.0)
MCHC: 31.7 g/dL (ref 30.0–36.0)
MCV: 89.7 fL (ref 80.0–100.0)
Platelets: 258 K/uL (ref 150–400)
RBC: 3.41 MIL/uL — ABNORMAL LOW (ref 3.87–5.11)
RDW: 17.6 % — ABNORMAL HIGH (ref 11.5–15.5)
WBC: 5.1 K/uL (ref 4.0–10.5)
nRBC: 0 % (ref 0.0–0.2)

## 2023-10-22 LAB — URINE CULTURE: Culture: 40000 — AB

## 2023-10-22 LAB — MAGNESIUM: Magnesium: 1.8 mg/dL (ref 1.7–2.4)

## 2023-10-22 MED ORDER — FLEET ENEMA RE ENEM: 1.0000 | ENEMA | Freq: Once | RECTAL | Status: DC

## 2023-10-22 MED ORDER — LACTULOSE 10 GM/15ML PO SOLN
30.0000 g | Freq: Once | ORAL | Status: AC
  Administered 2023-10-22: 30 g via ORAL
  Filled 2023-10-22: qty 60

## 2023-10-22 MED ORDER — METOPROLOL SUCCINATE ER 25 MG PO TB24
25.0000 mg | ORAL_TABLET | Freq: Every day | ORAL | Status: DC
  Administered 2023-10-22 – 2023-10-26 (×5): 25 mg via ORAL
  Filled 2023-10-22 (×5): qty 1

## 2023-10-22 NOTE — Plan of Care (Signed)

## 2023-10-22 NOTE — Progress Notes (Signed)
 Progress Note   Patient: DIMA MINI FMW:[id number] DOB: 1945-04-10 DOA: 10/19/2023     3 DOS: the patient was seen and examined on 10/22/2023   Brief hospital course: SYDNEI OHAVER is a 78 y.o. female with medical history significant of dCHF, PE on Eliquis , HTN, DM, hypothyroidism, GI bleeding, CKD-3A, lymphedema, hard of hearing, ovarian cancer, lumbar spinal stenosis, s/p of surgery for left hip fracture 09/09/23, who presents with AMS and SOB.  Patient developed significant respite distress, VBG showed severe CO2 retention, he also had severe hypoxia.  Due to respite distress, she was admitted to the ICU stepdown unit and placed on BiPAP.    Principal Problem:   Acute respiratory failure with hypoxia and hypercapnia (HCC) Active Problems:   Acute metabolic encephalopathy   Hypotension   UTI (urinary tract infection)   Acute urinary retention   Chronic diastolic CHF (congestive heart failure) (HCC)   Hypertension   Pulmonary embolism (HCC)   Chronic kidney disease, stage 3a (HCC)   Hypothyroidism   Type II diabetes mellitus with renal manifestations (HCC)   Iron  deficiency anemia   Hypokalemia   Hypomagnesemia   Obesity (BMI 30-39.9)   Altered mental status   Acute on chronic respiratory failure with hypercapnia (HCC)   COPD with acute exacerbation (HCC)   Assessment and Plan:  Acute respiratory failure with hypoxemia and hypercapnia. COPD exacerbation. Acute bronchitis with mucous plug. Patient requiring BiPAP since admission to the hospital, appreciate pulmonology consult. Respiratory failure appears to be due to COPD exacerbation and mucous plug. She had 20 years of smoking history, making COPD highly likely.  CT scan also showed significant mucous plug, this might be due to acute bronchitis.  Procalcitonin level less than 0.1, BNP level normal.  There is no evidence of volume overload. She is treated with BiPAP, scheduled DuoNeb, oral prednisone .  Patient  could not tolerate Mucomyst .  Oxygenation is better today from she has been off BiPAP, on 2 L oxygen.  Oxygen saturation better. Will continue Zithromax .  Rocephin  was started for possible UTI.  Will continue. Patient respiratory status has improved, will transfer out to medical floor. 7/26. Patient has crackles in the right base, repeat chest x-ray does not seem to be worse.  BNP and procalcitonin level still normal. Patient condition appears to be improving, short of breath better.  Continue steroids and antibiotics.  Continue scheduled bronchodilator.   Acute metabolic encephalopathy. Possible baseline dementia. Spoke with the patient and husband, patient has some memory defect recently.  Worsening mental status at the time of admission due to severe CO2 retention.  Mental status has improved.   Hypotension. Essential hypertension. Hypotension resolved, discontinued midodrine . Blood pressure running high again, restarted metoprolol .    Chronic diastolic congestive heart failure. No exacerbation at this time.  Restart metoprolol .   History of pulmonary emboli. Continue Eliquis .   Chronic kidney disease stage IIIa. Patient has decreased urine output, renal function is getting worse, but does not meet criteria for acute kidney injury.  Patient been receiving IV fluids, renal function finally improving, patient is making more urine.  I will continue another day of IV fluids.   Acute urinary retention with E. coli UTI ruled in. Urine culture grew low counts of E. coli, final susceptibility pending.  Will continue Rocephin .   Constipation. KUB has increased stool burden, received the lactulose  senna, still has not vomited today.  Increase dose of lactulose , also give a Fleet enema.  Continue to monitor.   Class  I obesity with BMI 34.44. Diet and excise.  Failure to thrive. Functional decline. Patient has significant decline in her mental status recently, palliative care consult  obtained.    Subjective:  Patient feels better today without significant short of breath.  Did not require BiPAP last night. Cough nonproductive. Still has no bowel movement after lactulose .  But no abdominal pain or nausea vomiting.  Physical Exam: Vitals:   10/21/23 2031 10/22/23 0318 10/22/23 0429 10/22/23 0856  BP: (!) 139/97 (!) 153/89  (!) 172/99  Pulse: 86 80  80  Resp: 16 16  17   Temp: 97.9 F (36.6 C) 97.6 F (36.4 C)  98.3 F (36.8 C)  TempSrc: Oral Oral  Oral  SpO2: 96% 97%  97%  Weight:   88.2 kg   Height:       General exam: Appears calm and comfortable  Respiratory system: Clear to auscultation. Respiratory effort normal. Cardiovascular system: S1 & S2 heard, RRR. No JVD, murmurs, rubs, gallops or clicks. No pedal edema. Gastrointestinal system: Abdomen is nondistended, soft and nontender. No organomegaly or masses felt. Normal bowel sounds heard. Central nervous system: Alert and oriented x2. No focal neurological deficits. Extremities: Symmetric 5 x 5 power. Skin: No rashes, lesions or ulcers Psychiatry: Judgement and insight appear normal. Mood & affect appropriate.    Data Reviewed:  Lab results reviewed.  Family Communication: None  Disposition: Status is: Inpatient Remains inpatient appropriate because: Severity of disease.     Time spent: 50 minutes  Author: Murvin Mana, MD 10/22/2023 10:31 AM  For on call review [email or web address].

## 2023-10-22 NOTE — Progress Notes (Signed)
 PT Cancellation Note  Patient Details Name: Stacy Kirk MRN: [id number] DOB: [birth year]   Cancelled Treatment:    Reason Eval/Treat Not Completed: Patient declined, no reason specified;Other (comment) (Pt eating breakfast)  Will re-attempt PT evaluation at later time when she is agreeable to participate  Jailyne Chieffo,MargaretPT, DPT 10/22/2023, 10:35 AM

## 2023-10-22 NOTE — Evaluation (Signed)
 Physical Therapy Evaluation Patient Details Name: Stacy Kirk MRN: [id number] DOB: 07/11/1945 Today's Date: 10/22/2023  History of Present Illness  78 y.o. female with medical history significant of dCHF, PE on Eliquis , HTN, DM, hypothyroidism, GI bleeding, CKD-3A, lymphedema, hard of hearing, ovarian cancer, lumbar spinal stenosis, s/p of surgery for left hip fracture 09/09/23, who presents with AMS and SOB.  Clinical Impression  78 yo Female reports recent decline in mobility. Patient did appear to have some confusion during history intake. Although unsure if there is cognitive impairments or if confusion is related to poor hearing. Patient is very hard of hearing and contributes to limited communication. She reports recent hospitalization with left hip fracture and subsequent surgery. She was discharged to rehab facility and then was discharged home. Per chart review, after patient was discharged home, she has been mostly bed bound. She lives at home with her husband and adult son with disabilities. Her husband has been primarily caring for their son. She does have some help from a personal care attendant for a few hours each day. Per patient, prior to hip fracture she was walking with RW and getting around home well, helping care for family. Currently patient demonstrates limited AROM in both legs. She requires extra time/effort to initiate movement with RLE and requires physical assistance to move LLE. Patient demonstrates significant limited AROM in LLE being unable to DF foot to neutral. She required mod A +1 for transition supine to sitting edge of bed. Patient able to sit edge of bed with feet supported with close stand by assistance. Attempted sit to stand transfers, but patient unable to achieve weight bearing with +1 assistance. She would benefit from skilled PT intervention to improve strength and mobility. Pt on 2L O2 and Spo2 levels 95% or more throughout PT evaluation.         If  plan is discharge home, recommend the following: Two people to help with walking and/or transfers;Two people to help with bathing/dressing/bathroom;Help with stairs or ramp for entrance;Assist for transportation;Assistance with cooking/housework   Can travel by private vehicle        Equipment Recommendations Other (comment) (TBD)  Recommendations for Other Services       Functional Status Assessment Patient has had a recent decline in their functional status and/or demonstrates limited ability to make significant improvements in function in a reasonable and predictable amount of time     Precautions / Restrictions Precautions Precautions: Fall Restrictions Weight Bearing Restrictions Per Provider Order: No      Mobility  Bed Mobility Overal bed mobility: Needs Assistance Bed Mobility: Supine to Sit, Sit to Supine, Rolling Rolling: Mod assist, Used rails   Supine to sit: Mod assist, HOB elevated, Used rails Sit to supine: Mod assist, +2 for physical assistance, HOB elevated, Used rails   General bed mobility comments: Pt requires extra time/effort to initiate movement in lower extremities and physical assistance for moving LLE. Pt required physical assistance to lift legs into bed (sit to supine). She was dependent +2 to scoot up in bed for repositioning.    Transfers Overall transfer level: Needs assistance Equipment used: Rolling walker (2 wheels) Transfers: Sit to/from Stand             General transfer comment: attempted sit to stand transfer with RW, unable to complete with +1 assist; Pt unable to keep left foot plate on the floor for weight acceptance; x3 attempts    Ambulation/Gait  General Gait Details: unable at this time  Stairs            Wheelchair Mobility     Tilt Bed    Modified Rankin (Stroke Patients Only)       Balance Overall balance assessment: Needs assistance Sitting-balance support: Feet supported, Single  extremity supported Sitting balance-Leahy Scale: Good Sitting balance - Comments: able to sit edge of bed and reach within base of support without loss of balance     Standing balance-Leahy Scale: Zero Standing balance comment: unable to achieve standing position                             Pertinent Vitals/Pain Pain Assessment Pain Assessment: Faces Faces Pain Scale: Hurts even more Pain Location: LLE with movement Pain Descriptors / Indicators: Discomfort Pain Intervention(s): Limited activity within patient's tolerance, Monitored during session, Repositioned    Home Living Family/patient expects to be discharged to:: Private residence Living Arrangements: Spouse/significant other Available Help at Discharge: Family;Available PRN/intermittently;Personal care attendant Type of Home: House Home Access: Ramped entrance       Home Layout: One level Home Equipment: Cane - single point;Grab bars - toilet;Grab bars - tub/shower;Rolling Walker (2 wheels) Additional Comments: reports that husband cares for son with disabilities at home and cant come    Prior Function Prior Level of Function : Needs assist               ADLs Comments: Pt has been bed bound per husband since returning home from rehab and has had PCA coming from 9:30-12:30 and 3:30-6:30     Extremity/Trunk Assessment   Upper Extremity Assessment Upper Extremity Assessment: Generalized weakness    Lower Extremity Assessment Lower Extremity Assessment: RLE deficits/detail;LLE deficits/detail RLE Deficits / Details: limited ROM due to weakness, grossly 2/5 RLE Sensation: WNL LLE Deficits / Details: Limited AROM, unable to DF left foot to neutral, foot rests in PF/pronation. Per patient, this occurred after hip injury LLE: Unable to fully assess due to pain LLE Sensation: WNL    Cervical / Trunk Assessment Cervical / Trunk Assessment: Kyphotic  Communication   Communication Communication: No  apparent difficulties Factors Affecting Communication: Hearing impaired    Cognition Arousal: Alert Behavior During Therapy: WFL for tasks assessed/performed   PT - Cognitive impairments: No apparent impairments                         Following commands: Intact       Cueing Cueing Techniques: Verbal cues     General Comments General comments (skin integrity, edema, etc.): demonstrates swelling in BLE; has multiple skin breakdown with bandages on LLE heel/foot- has Prevalon boot on LLE; During PT eval, IV site started bleeding, RN notified and she came and changed dressing.    Exercises Other Exercises Other Exercises: Pt educated on role of PT and recommendations;   Assessment/Plan    PT Assessment Patient needs continued PT services  PT Problem List Decreased strength;Cardiopulmonary status limiting activity;Pain;Decreased range of motion;Decreased activity tolerance;Decreased balance;Decreased mobility;Decreased skin integrity       PT Treatment Interventions Balance training;Gait training;DME instruction;Functional mobility training;Patient/family education;Therapeutic activities;Therapeutic exercise    PT Goals (Current goals can be found in the Care Plan section)  Acute Rehab PT Goals Patient Stated Goal: to walk again PT Goal Formulation: With patient Time For Goal Achievement: 11/05/23 Potential to Achieve Goals: Fair    Frequency Min  2X/week     Co-evaluation               AM-PAC PT 6 Clicks Mobility  Outcome Measure Help needed turning from your back to your side while in a flat bed without using bedrails?: A Little Help needed moving from lying on your back to sitting on the side of a flat bed without using bedrails?: A Lot Help needed moving to and from a bed to a chair (including a wheelchair)?: Total Help needed standing up from a chair using your arms (e.g., wheelchair or bedside chair)?: Total Help needed to walk in hospital room?:  Total Help needed climbing 3-5 steps with a railing? : Total 6 Click Score: 9    End of Session Equipment Utilized During Treatment: Gait belt Activity Tolerance: Patient limited by pain Patient left: in bed;with call bell/phone within reach;with bed alarm set Nurse Communication: Mobility status PT Visit Diagnosis: Other abnormalities of gait and mobility (R26.89);Muscle weakness (generalized) (M62.81);History of falling (Z91.81);Pain Pain - Right/Left: Left Pain - part of body: Hip;Knee;Ankle and joints of foot    Time: 1240-1320 PT Time Calculation (min) (ACUTE ONLY): 40 min   Charges:   PT Evaluation $PT Eval Moderate Complexity: 1 Mod   PT General Charges $$ ACUTE PT VISIT: 1 Visit         Yaniah Thiemann PT, DPT 10/22/2023, 2:08 PM

## 2023-10-22 NOTE — Progress Notes (Addendum)
 Palliative Care Progress Note, Assessment & Plan   Patient Name: Stacy Kirk       Date: 10/22/2023 DOB: 02-19-1946  Age: 78 y.o. MRN#: [id number] Attending Physician: Laurita Pillion, MD Primary Care Physician: Wendee Lynwood HERO, NP Admit Date: 10/19/2023  Subjective: Complains of mild abdominal pain from constipation today.  Slept well last night.  Eating some of her meals.  Endorses some shortness of breath-made patient aware nasal cannula should be in her nose instead of on her head.  Denies chest pain.  Patient wants to go home.  HPI: 78 y.o. female  with past medical history significant for dCHF, PE on Eliquis , HTN, DM, hypothyroidism ism, GI bleeding, CKD-3 AA, lymphedema, HOH, ovarian cancer, lumbar spinal stenosis and bedbound status. Patient presented to ED 10/19/2023 from home via EMS c/o AMS and shortness of breath x 3 days.  Patient found to be hypoxic at 87% on room air by EMS and placed on 4L nasal cannula.   ED labs showed WBC 7.2, K+ 3.0, mag 1.5, troponin 16 => 13, lactic 0.9 => 1.3, UA showed small amount leukocyte with rare bacteria and WBC 6-10.  VBG 1707.36, CO2 92, O2 43.  Repeat VBG 2155 pH 7.33, CO2 100, O2 16.   CXR negative.  CT head negative for acute intracranial abnormalities.  CTA negative for PE.  CTAP showed bladder distention and possible bladder outlet obstruction.   Patient was admitted to SDU for management of acute hypercarbic respiratory failure, acute encephalopathy possibly 2/2 hypercapnia, hypotension, acute recurrent urinary retention and outflow obstruction.   Of note, patient was recently discharged to SNF rehab 09/14/2023 for close left hip fracture status post left hip intramedullary nail.   PMT was consulted for assistance with goals of care  conversations.  Summary of counseling/coordination of care: Extensive chart review completed prior to meeting patient including labs, vital signs, imaging, progress notes, orders, and available advanced directive documents from current and previous encounters.   After reviewing the patient's chart and assessing the patient at bedside, I spoke with patient in regards to symptom management and goals of care.   Elderly, ill-appearing female resting in bed.  No friends or family at bedside.  Appears more alert and interactive today.  She is alert and able to participate in conversation but exhibits some confusion.  She is oriented to self, DOB and states Trump is president.  She incorrectly states year is 2024 and her location is somewhere near [place].  Even, unlabored respirations.  She is in no distress.  Per conversation with patient's husband Stacy Kirk yesterday, plan to meet at patient's bedside today to further discuss goals of care.  There were no friends or family at bedside during visit.  Patient states husband has not been here today and he probably will not visit.  Therapeutic silence and active listening provided for patient to share her thoughts and emotions regarding current medical situation.  Emotional support provided.  Physical Exam Vitals reviewed.  Constitutional:      General: She is not in acute distress.    Appearance: She is ill-appearing.  HENT:     Head: Normocephalic and atraumatic.     Mouth/Throat:  Mouth: Mucous membranes are moist.  Pulmonary:     Effort: Pulmonary effort is normal. No respiratory distress.     Comments: O2 via [place] Abdominal:     General: Bowel sounds are normal. There is no distension.     Tenderness: There is no abdominal tenderness. There is no guarding.  Musculoskeletal:     Right lower leg: No edema.     Left lower leg: No edema.     Comments: Unna boot to left foot  Skin:    General: Skin is warm and dry.  Neurological:     Mental  Status: She is alert. She is disoriented.     Motor: Weakness present.      Recommendations/Plan: DNR/DNI Continue current supportive interventions PMT to continue goals of care conversations with husband Disposition unclear at this time        Addendum 1555: Notified by RN that family had arrived at bedside.  Met with Stacy Kirk (husband), Stacy Kirk (brother), and Stacy Kirk (brother) to discuss further goals of care.  Husband and brothers all endorse that they want to honor patient's wish to return home, but there is a significant need for in-home care especially with Stacy Kirk having to care for special needs son.  Stacy Kirk has been paying [facility] out-of-pocket for daily care.  He wants to see what his wife may be eligible for.   Also discussed outpatient palliative following up in the home to which they are agreeable to.  Patient and family expressed appreciation for the care that she has received while she has been here.  Referral placed for outpatient palliative services to follow at discharge and question to eligibility for possible home health services.  Total Time 50 minutes    Discussed plan of care with TOC/ACC liaison.  Time spent includes: Detailed review of medical records (labs, imaging, vital signs), medically appropriate exam (mental status, respiratory, cardiac, skin), discussed with treatment team, counseling and educating patient, family and staff, documenting clinical information, medication management and coordination of care.     Devere Sacks, AMANDA [facility] Palliative Medicine Team  10/22/2023 2:56 PM  Office [phone]  Pager [phone]

## 2023-10-23 DIAGNOSIS — R4182 Altered mental status, unspecified: Secondary | ICD-10-CM

## 2023-10-23 DIAGNOSIS — Z66 Do not resuscitate: Secondary | ICD-10-CM

## 2023-10-23 DIAGNOSIS — R339 Retention of urine, unspecified: Secondary | ICD-10-CM

## 2023-10-23 DIAGNOSIS — J441 Chronic obstructive pulmonary disease with (acute) exacerbation: Secondary | ICD-10-CM

## 2023-10-23 DIAGNOSIS — J9622 Acute and chronic respiratory failure with hypercapnia: Secondary | ICD-10-CM

## 2023-10-23 DIAGNOSIS — Z789 Other specified health status: Secondary | ICD-10-CM

## 2023-10-23 DIAGNOSIS — Z515 Encounter for palliative care: Secondary | ICD-10-CM

## 2023-10-23 LAB — GLUCOSE, CAPILLARY
Glucose-Capillary: 101 mg/dL — ABNORMAL HIGH (ref 70–99)
Glucose-Capillary: 102 mg/dL — ABNORMAL HIGH (ref 70–99)
Glucose-Capillary: 126 mg/dL — ABNORMAL HIGH (ref 70–99)
Glucose-Capillary: 183 mg/dL — ABNORMAL HIGH (ref 70–99)
Glucose-Capillary: 186 mg/dL — ABNORMAL HIGH (ref 70–99)

## 2023-10-23 LAB — RENAL FUNCTION PANEL
Albumin: 2.8 g/dL — ABNORMAL LOW (ref 3.5–5.0)
Anion gap: 11 (ref 5–15)
BUN: 27 mg/dL — ABNORMAL HIGH (ref 8–23)
CO2: 30 mmol/L (ref 22–32)
Calcium: 8.9 mg/dL (ref 8.9–10.3)
Chloride: 95 mmol/L — ABNORMAL LOW (ref 98–111)
Creatinine, Ser: 1.41 mg/dL — ABNORMAL HIGH (ref 0.44–1.00)
GFR, Estimated: 38 mL/min — ABNORMAL LOW (ref >60)
Glucose, Bld: 104 mg/dL — ABNORMAL HIGH (ref 70–99)
Phosphorus: 2.4 mg/dL — ABNORMAL LOW (ref 2.5–4.6)
Potassium: 3.9 mmol/L (ref 3.5–5.1)
Sodium: 136 mmol/L (ref 135–145)

## 2023-10-23 LAB — CBC
HCT: 30.3 % — ABNORMAL LOW (ref 36.0–46.0)
Hemoglobin: 9.8 g/dL — ABNORMAL LOW (ref 12.0–15.0)
MCH: 28.2 pg (ref 26.0–34.0)
MCHC: 32.3 g/dL (ref 30.0–36.0)
MCV: 87.3 fL (ref 80.0–100.0)
Platelets: 281 K/uL (ref 150–400)
RBC: 3.47 MIL/uL — ABNORMAL LOW (ref 3.87–5.11)
RDW: 17.7 % — ABNORMAL HIGH (ref 11.5–15.5)
WBC: 7.7 K/uL (ref 4.0–10.5)
nRBC: 0 % (ref 0.0–0.2)

## 2023-10-23 LAB — MAGNESIUM: Magnesium: 1.8 mg/dL (ref 1.7–2.4)

## 2023-10-23 MED ORDER — COLLAGENASE 250 UNIT/GM EX OINT
TOPICAL_OINTMENT | Freq: Every day | CUTANEOUS | Status: DC
  Filled 2023-10-23: qty 30

## 2023-10-23 MED ORDER — PREDNISONE 20 MG PO TABS
20.0000 mg | ORAL_TABLET | Freq: Every day | ORAL | Status: DC
  Administered 2023-10-24: 20 mg via ORAL
  Filled 2023-10-23: qty 1

## 2023-10-23 MED ORDER — INSULIN ASPART 100 UNIT/ML IJ SOLN
0.0000 [IU] | Freq: Three times a day (TID) | INTRAMUSCULAR | Status: DC
  Administered 2023-10-23: 3 [IU] via SUBCUTANEOUS
  Administered 2023-10-24: 5 [IU] via SUBCUTANEOUS
  Administered 2023-10-24: 2 [IU] via SUBCUTANEOUS
  Filled 2023-10-23 (×3): qty 1

## 2023-10-23 MED ORDER — LACTULOSE 10 GM/15ML PO SOLN
45.0000 g | Freq: Two times a day (BID) | ORAL | Status: AC
  Administered 2023-10-23: 45 g via ORAL
  Filled 2023-10-23: qty 90

## 2023-10-23 MED ORDER — PEG 3350-KCL-NA BICARB-NACL 420 G PO SOLR
2000.0000 mL | Freq: Once | ORAL | Status: DC
  Filled 2023-10-23: qty 4000

## 2023-10-23 MED ORDER — LACTULOSE 10 GM/15ML PO SOLN: 45.0000 g | Freq: Two times a day (BID) | ORAL | Status: DC

## 2023-10-23 MED ORDER — CEPHALEXIN 500 MG PO CAPS
500.0000 mg | ORAL_CAPSULE | Freq: Two times a day (BID) | ORAL | Status: AC
  Administered 2023-10-23 – 2023-10-25 (×6): 500 mg via ORAL
  Filled 2023-10-23 (×6): qty 1

## 2023-10-23 MED ORDER — CEPHALEXIN 500 MG PO CAPS: 500.0000 mg | ORAL_CAPSULE | Freq: Three times a day (TID) | ORAL | Status: DC

## 2023-10-23 NOTE — Progress Notes (Signed)
 Palliative Care Progress Note, Assessment & Plan   Patient Name: Stacy Kirk       Date: 10/23/2023 DOB: 1945/09/18  Age: 78 y.o. MRN#: [id number] Attending Physician: Laurita Pillion, MD Primary Care Physician: Wendee Lynwood HERO, NP Admit Date: 10/19/2023  Subjective: Reports feeling more tired today.  Did not eat much breakfast today.  Denies chest pain or shortness of breath.  Endorses mild abdominal pain due to constipation with no response to medications.  Patient reports she can go home once she has a BM.  HPI: 78 y.o. female  with past medical history significant for dCHF, PE on Eliquis , HTN, DM, hypothyroidism ism, GI bleeding, CKD-3 AA, lymphedema, HOH, ovarian cancer, lumbar spinal stenosis and bedbound status. Patient presented to ED 10/19/2023 from home via EMS c/o AMS and shortness of breath x 3 days.  Patient found to be hypoxic at 87% on room air by EMS and placed on 4L nasal cannula.   ED labs showed WBC 7.2, K+ 3.0, mag 1.5, troponin 16 => 13, lactic 0.9 => 1.3, UA showed small amount leukocyte with rare bacteria and WBC 6-10.  VBG 1707.36, CO2 92, O2 43.  Repeat VBG 2155 pH 7.33, CO2 100, O2 16.   CXR negative.  CT head negative for acute intracranial abnormalities.  CTA negative for PE.  CTAP showed bladder distention and possible bladder outlet obstruction.   Patient was admitted to SDU for management of acute hypercarbic respiratory failure, acute encephalopathy possibly 2/2 hypercapnia, hypotension, acute recurrent urinary retention and outflow obstruction.   Of note, patient was recently discharged to SNF rehab 09/14/2023 for close left hip fracture status post left hip intramedullary nail.   PMT was consulted for assistance with goals of care conversations.  Summary of  counseling/coordination of care: Extensive chart review completed prior to meeting patient including labs, vital signs, imaging, progress notes, orders, and available advanced directive documents from current and previous encounters.   After reviewing the patient's chart and assessing the patient at bedside, I spoke with patient in regards to symptom management and goals of care.   Elderly, ill-appearing female resting in bed.  She appears more tired and less interactive today than yesterday.  No friends or family at bedside.  She is alert and able to participate in conversation but still exhibits some intermittent confusion.  She is oriented to self, year, date of birth.  She is unable to correctly state where she is at.  Even, unlabored respirations with no oxygen needs today.  She is in no distress.  Therapeutic silence and active listening provided for patient to share her thoughts and emotions regarding current medical situation.  Emotional support provided.  Physical Exam Vitals reviewed.  Constitutional:      General: She is not in acute distress.    Appearance: She is ill-appearing. She is not toxic-appearing.  HENT:     Head: Normocephalic and atraumatic.     Mouth/Throat:     Mouth: Mucous membranes are moist.  Pulmonary:     Effort: Pulmonary effort is normal. No respiratory distress.  Abdominal:     General: There is distension.     Tenderness: There is no abdominal tenderness. There is  no guarding.  Musculoskeletal:     Right lower leg: No edema.     Left lower leg: No edema.     Comments: Unna boot to left foot  Skin:    General: Skin is warm and dry.  Neurological:     Mental Status: She is alert. She is disoriented.     Motor: Weakness present.  Psychiatric:        Mood and Affect: Mood normal.        Behavior: Behavior normal.     Recommendations/Plan: DNR/DNI Continue current supportive interventions Plan to discharge home with home health and a palliative  following once medically stable    PMT will step back from daily visits but remain available as needed.            Total Time 25 minutes   Time spent includes: Detailed review of medical records (labs, imaging, vital signs), medically appropriate exam (mental status, respiratory, cardiac, skin), discussed with treatment team, counseling and educating patient, family and staff, documenting clinical information, medication management and coordination of care.     Devere Sacks, ELNITA- [facility] Palliative Medicine Team  10/23/2023 11:18 AM  Office [phone]  Pager [phone]

## 2023-10-23 NOTE — Progress Notes (Signed)
 Occupational Therapy Treatment Patient Details Name: Stacy Kirk MRN: [id number] DOB: Mar 12, 1946 Today's Date: 10/23/2023   History of present illness 78 y.o. female with medical history significant of dCHF, PE on Eliquis , HTN, DM, hypothyroidism, GI bleeding, CKD-3A, lymphedema, hard of hearing, ovarian cancer, lumbar spinal stenosis, s/p of surgery for left hip fracture 09/09/23, who presents with AMS and SOB.   OT comments  Pt seen for OT treatment on this date. Upon arrival to room pt sleeping soundly but wakes to voice with tactile stimulation. Agreeable to tx with heavy verbal encouragement, limited pt participation secondary to agitation. Pt requires setupA for bed level face washing. Pt demonstrated full AROM of her RUE but refuses to attempted LUE. Grip strength equal within bilUE. Pt allowed repositioning of LEs and donning of provolone boot on LLE only, MAXA to complete. Pt stated she gets along fine at home by using her walker but feels no one here wants me to walk. Pt encouraged to demonstrate transfers she conducts at home to prevent deconditioning but pt refuses to attempt on this date. Pt declined further ADL completion on this date. Pt making poor progress toward goals, will continue to follow POC. Discharge recommendation remains appropriate.        If plan is discharge home, recommend the following:  Two people to help with walking and/or transfers;Two people to help with bathing/dressing/bathroom;Supervision due to cognitive status   Equipment Recommendations  Hospital bed    Recommendations for Other Services      Precautions / Restrictions Precautions Precautions: Fall Recall of Precautions/Restrictions: Impaired Restrictions Weight Bearing Restrictions Per Provider Order: No       Mobility Bed Mobility Overal bed mobility: Needs Assistance             General bed mobility comments: Pt refused to repositioning and rolling dispite verbal  encouragement    Transfers                   General transfer comment: Pt refusesed all mobility attempts, easily agitated     Balance                                           ADL either performed or assessed with clinical judgement   ADL Overall ADL's : Needs assistance/impaired Eating/Feeding: Set up;Sitting   Grooming: Wash/dry face;Set up               Lower Body Dressing: Maximal assistance;Bed level Lower Body Dressing Details (indicate cue type and reason): Donning prevalon boot at bed level               General ADL Comments: Anticipate MAX-Total A with ADL    Communication Communication Communication: No apparent difficulties Factors Affecting Communication: Hearing impaired   Cognition Arousal: Alert Behavior During Therapy: WFL for tasks assessed/performed Cognition: History of cognitive impairments             OT - Cognition Comments: A/Ox2, unable to state situation and correct date at the time of session                 Following commands: Impaired Following commands impaired: Follows multi-step commands with increased time      Cueing   Cueing Techniques: Verbal cues  Exercises Exercises: Other exercises Other Exercises Other Exercises: Edu: Benefits of OT session, need for repositioning to prevent skin breakdown  Shoulder Instructions       General Comments Noted multiple bandaged on LLE heel/foot    Pertinent Vitals/ Pain       Pain Assessment Pain Assessment: Faces Faces Pain Scale: No hurt Pain Location: LLE with movement (No observered discomfort however pt stating pain with LLE when moving) Pain Descriptors / Indicators: Discomfort Pain Intervention(s): Limited activity within patient's tolerance, Repositioned                                                          Frequency  Min 1X/week        Progress Toward Goals  OT Goals(current goals can now  be found in the care plan section)  Progress towards OT goals: Not progressing toward goals - comment (On therapy trial)  Acute Rehab OT Goals OT Goal Formulation: With family Time For Goal Achievement: 11/04/23 Potential to Achieve Goals: Fair ADL Goals Pt Will Transfer to Toilet: with mod assist;bedside commode Pt Will Perform Toileting - Clothing Manipulation and hygiene: with mod assist;sit to/from stand Pt/caregiver will Perform Home Exercise Program: Both right and left upper extremity;Increased ROM;Increased strength;With theraband;With Supervision;With written HEP provided  Plan      Co-evaluation                 AM-PAC OT 6 Clicks Daily Activity     Outcome Measure   Help from another person eating meals?: None Help from another person taking care of personal grooming?: A Little Help from another person toileting, which includes using toliet, bedpan, or urinal?: Total Help from another person bathing (including washing, rinsing, drying)?: Total Help from another person to put on and taking off regular upper body clothing?: A Lot Help from another person to put on and taking off regular lower body clothing?: Total 6 Click Score: 12    End of Session    OT Visit Diagnosis: Unsteadiness on feet (R26.81);Repeated falls (R29.6);Muscle weakness (generalized) (M62.81);History of falling (Z91.81)   Activity Tolerance Treatment limited secondary to agitation   Patient Left in bed;with call bell/phone within reach;with bed alarm set   Nurse Communication          Time: 9051-8996 OT Time Calculation (min): 15 min  Charges: OT General Charges $OT Visit: 1 Visit OT Treatments $Self Care/Home Management : 8-22 mins  Larraine Colas M.S. OTR/L  10/23/23, 10:25 AM

## 2023-10-23 NOTE — Plan of Care (Signed)

## 2023-10-23 NOTE — TOC Progression Note (Signed)
 Transition of Care [facility]) - Progression Note    Patient Details  Name: Stacy Kirk MRN: [id number] Date of Birth: [birth year]  Transition of Care [facility]) CM/SW Contact  Corean ONEIDA Haddock, RN Phone Number: 10/23/2023, 3:10 PM  Clinical Narrative:      Followed up with husband via phone regarding dc disposition He confirms he is not interested in SNF, and is in agreement to home health.  CMS Medicare.gov Compare Post Acute Care list reviewed with husband.  He states he does not have a preference of agency.  Referral made to [facility] with [facility] for PT OT aide.     He states that patient has a hospital bed, hoyer lift, WC, cane, and purewick in the home.    Will need non emergent ems transport at discharge               Expected Discharge Plan and Services                                               Social Drivers of Health (SDOH) Interventions SDOH Screenings   Food Insecurity: No Food Insecurity (10/20/2023)  Recent Concern: Food Insecurity - Food Insecurity Present (09/27/2023)   Received from [facility]  Housing: Low Risk  (10/20/2023)  Transportation Needs: No Transportation Needs (10/20/2023)  Utilities: Not At Risk (10/20/2023)  Financial Resource Strain: Medium Risk (09/27/2023)   Received from [facility]  Social Connections: Unknown (10/20/2023)  Tobacco Use: Medium Risk (10/19/2023)    Readmission Risk Interventions     No data to display

## 2023-10-23 NOTE — Plan of Care (Signed)
 Problem: Education: Goal: Ability to describe self-care measures that may prevent or decrease complications (Diabetes Survival Skills Education) will improve 10/23/2023 0211 by Addie Wanda HERO, RN Outcome: Progressing 10/23/2023 0102 by Addie Wanda HERO, RN Outcome: Progressing Goal: Individualized Educational Video(s) 10/23/2023 0211 by Addie Wanda HERO, RN Outcome: Progressing 10/23/2023 0102 by Addie Wanda HERO, RN Outcome: Progressing   Problem: Coping: Goal: Ability to adjust to condition or change in health will improve 10/23/2023 0211 by Addie Wanda HERO, RN Outcome: Progressing 10/23/2023 0102 by Addie Wanda HERO, RN Outcome: Progressing   Problem: Fluid Volume: Goal: Ability to maintain a balanced intake and output will improve 10/23/2023 0211 by Addie Wanda HERO, RN Outcome: Progressing 10/23/2023 0102 by Addie Wanda HERO, RN Outcome: Progressing   Problem: Health Behavior/Discharge Planning: Goal: Ability to identify and utilize available resources and services will improve 10/23/2023 0211 by Addie Wanda HERO, RN Outcome: Progressing 10/23/2023 0102 by Addie Wanda HERO, RN Outcome: Progressing Goal: Ability to manage health-related needs will improve 10/23/2023 0211 by Addie Wanda HERO, RN Outcome: Progressing 10/23/2023 0102 by Addie Wanda HERO, RN Outcome: Progressing   Problem: Metabolic: Goal: Ability to maintain appropriate glucose levels will improve 10/23/2023 0211 by Addie Wanda HERO, RN Outcome: Progressing 10/23/2023 0102 by Addie Wanda HERO, RN Outcome: Progressing   Problem: Nutritional: Goal: Maintenance of adequate nutrition will improve 10/23/2023 0211 by Addie Wanda HERO, RN Outcome: Progressing 10/23/2023 0102 by Addie Wanda HERO, RN Outcome: Progressing Goal: Progress toward achieving an optimal weight will improve 10/23/2023 0211 by Addie Wanda HERO, RN Outcome: Progressing 10/23/2023 0102 by Addie Wanda HERO, RN Outcome:  Progressing   Problem: Skin Integrity: Goal: Risk for impaired skin integrity will decrease 10/23/2023 0211 by Addie Wanda HERO, RN Outcome: Progressing 10/23/2023 0102 by Addie Wanda HERO, RN Outcome: Progressing   Problem: Tissue Perfusion: Goal: Adequacy of tissue perfusion will improve 10/23/2023 0211 by Addie Wanda HERO, RN Outcome: Progressing 10/23/2023 0102 by Addie Wanda HERO, RN Outcome: Progressing   Problem: Education: Goal: Knowledge of General Education information will improve Description: Including pain rating scale, medication(s)/side effects and non-pharmacologic comfort measures 10/23/2023 0211 by Addie Wanda HERO, RN Outcome: Progressing 10/23/2023 0102 by Addie Wanda HERO, RN Outcome: Progressing   Problem: Health Behavior/Discharge Planning: Goal: Ability to manage health-related needs will improve 10/23/2023 0211 by Addie Wanda HERO, RN Outcome: Progressing 10/23/2023 0102 by Addie Wanda HERO, RN Outcome: Progressing   Problem: Clinical Measurements: Goal: Ability to maintain clinical measurements within normal limits will improve 10/23/2023 0211 by Addie Wanda HERO, RN Outcome: Progressing 10/23/2023 0102 by Addie Wanda HERO, RN Outcome: Progressing Goal: Will remain free from infection 10/23/2023 0211 by Addie Wanda HERO, RN Outcome: Progressing 10/23/2023 0102 by Addie Wanda HERO, RN Outcome: Progressing Goal: Diagnostic test results will improve 10/23/2023 0211 by Addie Wanda HERO, RN Outcome: Progressing 10/23/2023 0102 by Addie Wanda HERO, RN Outcome: Progressing Goal: Respiratory complications will improve 10/23/2023 0211 by Addie Wanda HERO, RN Outcome: Progressing 10/23/2023 0102 by Addie Wanda HERO, RN Outcome: Progressing Goal: Cardiovascular complication will be avoided 10/23/2023 0211 by Addie Wanda HERO, RN Outcome: Progressing 10/23/2023 0102 by Addie Wanda HERO, RN Outcome: Progressing   Problem: Activity: Goal: Risk  for activity intolerance will decrease 10/23/2023 0211 by Addie Wanda HERO, RN Outcome: Progressing 10/23/2023 0102 by Addie Wanda HERO, RN Outcome: Progressing   Problem: Nutrition: Goal: Adequate nutrition will be maintained 10/23/2023 0211 by Addie Wanda HERO, RN Outcome: Progressing 10/23/2023 0102 by Addie Wanda HERO, RN Outcome: Progressing  Problem: Coping: Goal: Level of anxiety will decrease 10/23/2023 0211 by Addie Wanda HERO, RN Outcome: Progressing 10/23/2023 0102 by Addie Wanda HERO, RN Outcome: Progressing   Problem: Elimination: Goal: Will not experience complications related to bowel motility 10/23/2023 0211 by Addie Wanda HERO, RN Outcome: Progressing 10/23/2023 0102 by Addie Wanda HERO, RN Outcome: Progressing Goal: Will not experience complications related to urinary retention 10/23/2023 0211 by Addie Wanda HERO, RN Outcome: Progressing 10/23/2023 0102 by Addie Wanda HERO, RN Outcome: Progressing   Problem: Pain Managment: Goal: General experience of comfort will improve and/or be controlled 10/23/2023 0211 by Addie Wanda HERO, RN Outcome: Progressing 10/23/2023 0102 by Addie Wanda HERO, RN Outcome: Progressing   Problem: Safety: Goal: Ability to remain free from injury will improve 10/23/2023 0211 by Addie Wanda HERO, RN Outcome: Progressing 10/23/2023 0102 by Addie Wanda HERO, RN Outcome: Progressing   Problem: Skin Integrity: Goal: Risk for impaired skin integrity will decrease 10/23/2023 0211 by Addie Wanda HERO, RN Outcome: Progressing 10/23/2023 0102 by Addie Wanda HERO, RN Outcome: Progressing

## 2023-10-23 NOTE — Progress Notes (Signed)
 Spoke with Laurita, MD,  via secure chat requesting order to change blood sugar checks and SSI to [employer].  Order obtained.

## 2023-10-23 NOTE — Consult Note (Signed)
 PODIATRY / FOOT AND ANKLE SURGERY CONSULTATION NOTE  Requesting Physician: Dr. Laurita  Reason for consult: L ankle inversion  HPI: Stacy Kirk is a 78 y.o. female who presents resting in bed fairly comfortably today.  She complains of left hip and lower extremity pain she notes some mild discomfort to her left ankle.  She notes that the ankle has been in an inverted position but has been this way as long as she can remember.  She does have Mepitel dressings present to both heels today.  She is supposed to wear Prevalon boot to left lower extremity but is not wearing this upon entry into the room today.  PMHx:  Past Medical History:  Diagnosis Date   Arthritis    Cancer (HCC)    ovarian, skin face- skin cancer   Complication of anesthesia    slow to awaken everday, even slower with anesthesia   Constipation    Diabetes mellitus    Type II   GERD (gastroesophageal reflux disease)    Headache 05/12/2015   after a fall headache for 8 months   Hemorrhoids    Hernia    History of kidney stones    passed   Hypertension    Hypothyroidism    Loss of appetite    Muscle spasm    back   Osteoporosis    Redness    abd wound   Urine frequency    unable to hold urine    Wears hearing aid    bilateral     Surgical Hx:  Past Surgical History:  Procedure Laterality Date   ABDOMINAL HYSTERECTOMY     APPENDECTOMY     CHOLECYSTECTOMY     COLONOSCOPY W/ POLYPECTOMY     HERNIA REPAIR  Jun2012   open component separation w biologic mesh (Dr. Vanderbilt)   INTRAMEDULLARY (IM) NAIL INTERTROCHANTERIC Left 09/09/2023   Procedure: FIXATION, FRACTURE, INTERTROCHANTERIC, WITH INTRAMEDULLARY ROD;  Surgeon: Lorelle Hussar, MD;  Location: [facility] ORS;  Service: Orthopedics;  Laterality: Left;   TOE SURGERY Left    2nd toe   TONSILLECTOMY      FHx:  Family History  Problem Relation Age of Onset   Diabetes Mother    Early death Sister    Drug abuse Sister     Social History:  reports  that she quit smoking about 25 years ago. Her smoking use included cigarettes. She started smoking about 40 years ago. She has been exposed to tobacco smoke. She has never used smokeless tobacco. She reports that she does not drink alcohol and does not use drugs.  Allergies:  Allergies  Allergen Reactions   Erythromycin Diarrhea and Other (See Comments)    Tears her stomach up. Major digestive upset.   Tramadol Shortness Of Breath and Other (See Comments)    Felt like lungs filled up; unable to lay down   Albumin (Human) Other (See Comments)    Pt unsure of reaction   Aspirin  Itching and Other (See Comments)    Caused lethargy, also   Codeine Itching and Other (See Comments)    Cause lethargy, also   Cortisone Other (See Comments)    NO STEROID INJECTIONS; headaches   Linzess  [Linaclotide ] Diarrhea   Propoxyphene Other (See Comments)    Terrible reaction; headaches   Simvastatin Itching and Other (See Comments)    Severe muscle aches   Triamcinolone Acetonide Other (See Comments)    headache   Medications Prior to Admission  Medication Sig Dispense Refill  acetaminophen  (TYLENOL ) 500 MG tablet Take 1,000 mg by mouth every 6 (six) hours as needed (FOR PAIN.).     cetirizine  (ZYRTEC ) 10 MG tablet Take 1 tablet (10 mg total) by mouth daily. 30 tablet 11   ELIQUIS  5 MG TABS tablet TAKE 1 TABLET BY MOUTH TWICE A DAY 60 tablet 3   ferrous sulfate  325 (65 FE) MG tablet TAKE 325 MG BY MOUTH DAILY. 30 tablet 1   levothyroxine  (SYNTHROID ) 100 MCG tablet TAKE 1 TABLET BY MOUTH EVERY DAY 90 tablet 1   lidocaine  (LIDODERM ) 5 % Place 1-2 patches onto the skin at bedtime. Remove & Discard patch within 12 hours or as directed by MD     metFORMIN  (GLUCOPHAGE ) 500 MG tablet TAKE 1 TABLET (500 MG TOTAL) BY MOUTH DAILY AFTER SUPPER. 90 tablet 1   methocarbamol  (ROBAXIN ) 500 MG tablet Take 1 tablet (500 mg total) by mouth every 8 (eight) hours as needed for muscle spasms.     multivitamin (RENA-VIT)  TABS tablet Take 1 tablet by mouth at bedtime.     pantoprazole  (PROTONIX ) 40 MG tablet TAKE 1 TABLET BY MOUTH EVERY DAY 90 tablet 1   [Paused] spironolactone  (ALDACTONE ) 25 MG tablet TAKE 1 TABLET (25 MG TOTAL) BY MOUTH DAILY. 90 tablet 0   fluticasone  (FLONASE ) 50 MCG/ACT nasal spray SPRAY 2 SPRAYS INTO EACH NOSTRIL EVERY DAY (Patient not taking: Reported on 10/19/2023) 48 mL 1   HYDROcodone -acetaminophen  (NORCO/VICODIN) 5-325 MG tablet Take 1 tablet by mouth every 6 (six) hours as needed for moderate pain (pain score 4-6). 20 tablet 0   lactose free nutrition (BOOST PLUS) LIQD Take 237 mLs by mouth 2 (two) times daily between meals.     metoprolol  succinate (TOPROL -XL) 25 MG 24 hr tablet Take 1 tablet (25 mg total) by mouth daily. (Patient not taking: Reported on 10/19/2023) 90 tablet 2   torsemide  (DEMADEX ) 20 MG tablet TAKE 2 TABLETS (40 MG TOTAL) BY MOUTH DAILY. 180 tablet 2    Physical Exam: General: Alert and oriented.  No apparent distress.  Vascular: DP/PT pulses palpable bilateral, capillary fill time intact to digits bilaterally, no hair growth present bilateral lower extremities.   Neuro: Light touch sensation absent to bilateral lower extremities.  Derm: Left heel posterior plantar aspect appears to have a eschar present with ulceration likely related to pressure, wound bed appears to be fibrogranular with a eschar present.  MSK: Left foot/ankle appears to be in an inverted position but appears to be flexible, this is a reducible deformity and the foot can be straightened with manual manipulation.  No pain on palpation left foot or ankle today.   Results for orders placed or performed during the hospital encounter of 10/19/23 (from the past 48 hours)  Glucose, capillary     Status: Abnormal   Collection Time: 10/21/23  5:44 PM  Result Value Ref Range   Glucose-Capillary 144 (H) 70 - 99 mg/dL    Comment: Glucose reference range applies only to samples taken after fasting for at  least 8 hours.  Glucose, capillary     Status: Abnormal   Collection Time: 10/21/23  7:41 PM  Result Value Ref Range   Glucose-Capillary 194 (H) 70 - 99 mg/dL    Comment: Glucose reference range applies only to samples taken after fasting for at least 8 hours.  Glucose, capillary     Status: Abnormal   Collection Time: 10/21/23 11:28 PM  Result Value Ref Range   Glucose-Capillary 151 (H) 70 -  99 mg/dL    Comment: Glucose reference range applies only to samples taken after fasting for at least 8 hours.  Glucose, capillary     Status: Abnormal   Collection Time: 10/22/23  3:21 AM  Result Value Ref Range   Glucose-Capillary 115 (H) 70 - 99 mg/dL    Comment: Glucose reference range applies only to samples taken after fasting for at least 8 hours.  CBC     Status: Abnormal   Collection Time: 10/22/23  5:24 AM  Result Value Ref Range   WBC 5.1 4.0 - 10.5 K/uL   RBC 3.41 (L) 3.87 - 5.11 MIL/uL   Hemoglobin 9.7 (L) 12.0 - 15.0 g/dL   HCT 69.3 (L) 63.9 - 53.9 %   MCV 89.7 80.0 - 100.0 fL   MCH 28.4 26.0 - 34.0 pg   MCHC 31.7 30.0 - 36.0 g/dL   RDW 82.3 (H) 88.4 - 84.4 %   Platelets 258 150 - 400 K/uL   nRBC 0.0 0.0 - 0.2 %    Comment: Performed at [facility], [address], [place], [place] [address]  Renal function panel     Status: Abnormal   Collection Time: 10/22/23  5:24 AM  Result Value Ref Range   Sodium 135 135 - 145 mmol/L   Potassium 4.1 3.5 - 5.1 mmol/L   Chloride 92 (L) 98 - 111 mmol/L   CO2 30 22 - 32 mmol/L   Glucose, Bld 122 (H) 70 - 99 mg/dL    Comment: Glucose reference range applies only to samples taken after fasting for at least 8 hours.   BUN 27 (H) 8 - 23 mg/dL   Creatinine, Ser 8.29 (H) 0.44 - 1.00 mg/dL   Calcium 8.8 (L) 8.9 - 10.3 mg/dL   Phosphorus 3.9 2.5 - 4.6 mg/dL   Albumin 2.6 (L) 3.5 - 5.0 g/dL   GFR, Estimated 30 (L) >60 mL/min    Comment: (NOTE) Calculated using the CKD-EPI Creatinine Equation (2021)    Anion gap 13 5 - 15     Comment: Performed at [facility], [address] [place], [place] [address]  Magnesium      Status: None   Collection Time: 10/22/23  5:24 AM  Result Value Ref Range   Magnesium  1.8 1.7 - 2.4 mg/dL    Comment: Performed at [facility], [address] [place], [place] [address]  Glucose, capillary     Status: Abnormal   Collection Time: 10/22/23  8:58 AM  Result Value Ref Range   Glucose-Capillary 119 (H) 70 - 99 mg/dL    Comment: Glucose reference range applies only to samples taken after fasting for at least 8 hours.  Glucose, capillary     Status: Abnormal   Collection Time: 10/22/23 12:14 PM  Result Value Ref Range   Glucose-Capillary 137 (H) 70 - 99 mg/dL    Comment: Glucose reference range applies only to samples taken after fasting for at least 8 hours.  Glucose, capillary     Status: Abnormal   Collection Time: 10/22/23  5:03 PM  Result Value Ref Range   Glucose-Capillary 220 (H) 70 - 99 mg/dL    Comment: Glucose reference range applies only to samples taken after fasting for at least 8 hours.  Glucose, capillary     Status: Abnormal   Collection Time: 10/22/23  7:38 PM  Result Value Ref Range   Glucose-Capillary 183 (H) 70 - 99 mg/dL    Comment: Glucose reference range  applies only to samples taken after fasting for at least 8 hours.  Glucose, capillary     Status: Abnormal   Collection Time: 10/22/23 11:45 PM  Result Value Ref Range   Glucose-Capillary 109 (H) 70 - 99 mg/dL    Comment: Glucose reference range applies only to samples taken after fasting for at least 8 hours.   Comment 1 Document in Chart   CBC     Status: Abnormal   Collection Time: 10/23/23  3:10 AM  Result Value Ref Range   WBC 7.7 4.0 - 10.5 K/uL   RBC 3.47 (L) 3.87 - 5.11 MIL/uL   Hemoglobin 9.8 (L) 12.0 - 15.0 g/dL   HCT 69.6 (L) 63.9 - 53.9 %   MCV 87.3 80.0 - 100.0 fL   MCH 28.2 26.0 - 34.0 pg   MCHC 32.3 30.0 - 36.0 g/dL   RDW 82.2 (H) 88.4 - 84.4 %   Platelets 281  150 - 400 K/uL   nRBC 0.0 0.0 - 0.2 %    Comment: Performed at [facility], [address] [place], [place] [address]  Renal function panel     Status: Abnormal   Collection Time: 10/23/23  3:10 AM  Result Value Ref Range   Sodium 136 135 - 145 mmol/L   Potassium 3.9 3.5 - 5.1 mmol/L   Chloride 95 (L) 98 - 111 mmol/L   CO2 30 22 - 32 mmol/L   Glucose, Bld 104 (H) 70 - 99 mg/dL    Comment: Glucose reference range applies only to samples taken after fasting for at least 8 hours.   BUN 27 (H) 8 - 23 mg/dL   Creatinine, Ser 8.58 (H) 0.44 - 1.00 mg/dL   Calcium 8.9 8.9 - 89.6 mg/dL   Phosphorus 2.4 (L) 2.5 - 4.6 mg/dL   Albumin 2.8 (L) 3.5 - 5.0 g/dL   GFR, Estimated 38 (L) >60 mL/min    Comment: (NOTE) Calculated using the CKD-EPI Creatinine Equation (2021)    Anion gap 11 5 - 15    Comment: Performed at [facility], [address] [place], [place] [address]  Magnesium      Status: None   Collection Time: 10/23/23  3:10 AM  Result Value Ref Range   Magnesium  1.8 1.7 - 2.4 mg/dL    Comment: Performed at [facility], [address] [place], [place] [address]  Glucose, capillary     Status: Abnormal   Collection Time: 10/23/23  4:23 AM  Result Value Ref Range   Glucose-Capillary 101 (H) 70 - 99 mg/dL    Comment: Glucose reference range applies only to samples taken after fasting for at least 8 hours.  Glucose, capillary     Status: Abnormal   Collection Time: 10/23/23  7:28 AM  Result Value Ref Range   Glucose-Capillary 102 (H) 70 - 99 mg/dL    Comment: Glucose reference range applies only to samples taken after fasting for at least 8 hours.  Glucose, capillary     Status: Abnormal   Collection Time: 10/23/23 11:41 AM  Result Value Ref Range   Glucose-Capillary 126 (H) 70 - 99 mg/dL    Comment: Glucose reference range applies only to samples taken after fasting for at least 8 hours.   No results found.  Blood pressure (!) 152/105, pulse 85,  temperature 98.4 F (36.9 C), resp. rate 19, height 5' 2 (1.575 m), weight 88.7 kg, SpO2 95%.   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  No obvious acute abnormalities present. - Patient notes that left foot has been slightly inverted in high arch for as long as she can remember.  Does not appear to be an acute issue.  Long-term would recommend usage of ASO ankle brace or potentially even a Richie brace that can be provided as outpatient to help keep the foot stabilize.  Currently not really having much pain to the left ankle overall on examination today. - Discussed more concerning issues left heel ulceration from pressure.  Still recommend usage of Prevalon boot to the left heel to avoid further issues with this.  Would recommend Santyl  be applied to the wound followed by Mepitel foam border dressing daily.  Also recommend Mepitel to the other heel to avoid pressure to that area 2. - Will try to place order for ASO brace while in house.  Patient may work physical therapy on this.  May be weightbearing as tolerated to left lower extremity as far as I can tell for the left ankle but if needing further instructions due to previous left hip fracture would recommend consulting orthopedics.  Podiatry team to sign off at this time.  Prentice Lee, DPM 10/23/2023, 3:42 PM

## 2023-10-23 NOTE — Progress Notes (Signed)
 Pt agreeable to enema at this time.  Pt ordered fleet prior that was not given due to pt refusal and tap water enema today.  Hollace Mana, MD, seeking clarification on which enema is preferred.  Verbal order given to administer fleet.      Fleet enema administered with minimal results.  Small amount of brown liquid stool noted in bedpan with very small amounts of formed stool.

## 2023-10-23 NOTE — Telephone Encounter (Signed)
 Pt looks like she is still in hospital. Get more information when discharged?

## 2023-10-23 NOTE — Progress Notes (Signed)
 Progress Note   Patient: Stacy Kirk FMW:[id number] DOB: 10-May-1945 DOA: 10/19/2023     4 DOS: the patient was seen and examined on 10/23/2023   Brief hospital course: ASTER SCREWS is a 78 y.o. female with medical history significant of dCHF, PE on Eliquis , HTN, DM, hypothyroidism, GI bleeding, CKD-3A, lymphedema, hard of hearing, ovarian cancer, lumbar spinal stenosis, s/p of surgery for left hip fracture 09/09/23, who presents with AMS and SOB.  Patient developed significant respite distress, VBG showed severe CO2 retention, he also had severe hypoxia.  Due to respite distress, she was admitted to the ICU stepdown unit and placed on BiPAP.    Principal Problem:   Acute respiratory failure with hypoxia and hypercapnia (HCC) Active Problems:   Acute metabolic encephalopathy   Hypotension   UTI (urinary tract infection)   Acute urinary retention   Chronic diastolic CHF (congestive heart failure) (HCC)   Hypertension   Pulmonary embolism (HCC)   Chronic kidney disease, stage 3a (HCC)   Hypothyroidism   Type II diabetes mellitus with renal manifestations (HCC)   Iron  deficiency anemia   Hypokalemia   Hypomagnesemia   Obesity (BMI 30-39.9)   Altered mental status   Acute on chronic respiratory failure with hypercapnia (HCC)   COPD with acute exacerbation (HCC)   Assessment and Plan:  Acute respiratory failure with hypoxemia and hypercapnia. COPD exacerbation. Acute bronchitis with mucous plug. Patient requiring BiPAP since admission to the hospital, appreciate pulmonology consult. Respiratory failure appears to be due to COPD exacerbation and mucous plug. She had 20 years of smoking history, making COPD highly likely.  CT scan also showed significant mucous plug, this might be due to acute bronchitis.  Procalcitonin level less than 0.1, BNP level normal.  There is no evidence of volume overload. She is treated with BiPAP, scheduled DuoNeb, oral prednisone .  Patient  could not tolerate Mucomyst .  Oxygenation is better today from she has been off BiPAP, on 2 L oxygen.  Oxygen saturation better. Will continue Zithromax .  Rocephin  was started for possible UTI.  Will continue. Patient respiratory status has improved, will transfer out to medical floor. 7/26. Patient has crackles in the right base, repeat chest x-ray does not seem to be worse.  BNP and procalcitonin level still normal. Condition finally improved, she is off oxygen.  No significant short of breath. Discontinue Zithromax , continue bronchodilator.  Wean down steroids.    Acute metabolic encephalopathy. Possible baseline dementia. Spoke with the patient and husband, patient has some memory defect recently.  Worsening mental status at the time of admission due to severe CO2 retention.  Mental status has improved.   Hypotension. Essential hypertension. Hypotension resolved, discontinued midodrine . Blood pressure running high again, restarted metoprolol . Continue to follow blood pressure     Chronic diastolic congestive heart failure. No exacerbation at this time.  Restart metoprolol .   History of pulmonary emboli. Continue Eliquis .   Chronic kidney disease stage IIIa. Patient has slightly worsening renal function not consistent with acute kidney injury.  She received IV fluids, renal function has back to baseline.   Acute urinary retention with E. coli UTI ruled in. Urine culture grew low counts of E. coli, received Rocephin , will change to Keflex  for additional 3 days.  Constipation. KUB has increased stool burden, received the lactulose  senna.  Looks like her last bowel movement was 7/25.  Will continue increased dose of lactulose  and will give water enema.  Class I obesity with BMI 34.44. Diet and excise.  Failure to thrive. Functional decline. Patient has significant decline in her mental status recently, palliative care consult obtained.  Left foot deformity. Appears to be  chronic, podiatry will see patient today.    Subjective:  Patient is doing well today, shortness of breath improved.  Physical Exam: Vitals:   10/22/23 1703 10/22/23 1933 10/23/23 0427 10/23/23 0732  BP: 137/72 132/78 (!) 171/97 (!) 154/87  Pulse: 85 83 90 85  Resp: 17 18 18 20   Temp: 98.3 F (36.8 C) 98.1 F (36.7 C) 98.2 F (36.8 C) 98 F (36.7 C)  TempSrc: Oral Oral Oral Oral  SpO2: 93% 92% 93% 94%  Weight:   88.7 kg   Height:       General exam: Appears calm and comfortable  Respiratory system: Decreased breath sounds. Respiratory effort normal. Cardiovascular system: S1 & S2 heard, RRR. No JVD, murmurs, rubs, gallops or clicks. No pedal edema. Gastrointestinal system: Abdomen is nondistended, soft and nontender. No organomegaly or masses felt. Normal bowel sounds heard. Central nervous system: Alert and oriented. No focal neurological deficits. Extremities: Symmetric 5 x 5 power. Skin: No rashes, lesions or ulcers Psychiatry:  Mood & affect appropriate.    Data Reviewed:  Lab results reviewed.  Family Communication: Husband and brother-in-law updated over the phone separately.  Disposition: Status is: Inpatient Remains inpatient appropriate because: Severity of disease.     Time spent: 50 minutes  Author: Murvin Mana, MD 10/23/2023 10:13 AM  For on call review [email or web address].

## 2023-10-23 NOTE — Telephone Encounter (Signed)
 Looks like she was recently admitted again. Ok to wait until discharge

## 2023-10-24 ENCOUNTER — Inpatient Hospital Stay

## 2023-10-24 DIAGNOSIS — K5909 Other constipation: Secondary | ICD-10-CM | POA: Diagnosis not present

## 2023-10-24 DIAGNOSIS — N3000 Acute cystitis without hematuria: Secondary | ICD-10-CM | POA: Diagnosis not present

## 2023-10-24 DIAGNOSIS — J9602 Acute respiratory failure with hypercapnia: Secondary | ICD-10-CM | POA: Diagnosis not present

## 2023-10-24 DIAGNOSIS — J9601 Acute respiratory failure with hypoxia: Secondary | ICD-10-CM | POA: Diagnosis not present

## 2023-10-24 LAB — BASIC METABOLIC PANEL WITH GFR
Anion gap: 12 (ref 5–15)
BUN: 23 mg/dL (ref 8–23)
CO2: 29 mmol/L (ref 22–32)
Calcium: 9.2 mg/dL (ref 8.9–10.3)
Chloride: 95 mmol/L — ABNORMAL LOW (ref 98–111)
Creatinine, Ser: 0.96 mg/dL (ref 0.44–1.00)
GFR, Estimated: 60 mL/min — ABNORMAL LOW (ref >60)
Glucose, Bld: 198 mg/dL — ABNORMAL HIGH (ref 70–99)
Potassium: 4.1 mmol/L (ref 3.5–5.1)
Sodium: 136 mmol/L (ref 135–145)

## 2023-10-24 LAB — CULTURE, BLOOD (ROUTINE X 2)
Culture: NO GROWTH
Culture: NO GROWTH
Special Requests: ADEQUATE
Special Requests: ADEQUATE

## 2023-10-24 LAB — GLUCOSE, CAPILLARY
Glucose-Capillary: 95 mg/dL (ref 70–99)
Glucose-Capillary: 135 mg/dL — ABNORMAL HIGH (ref 70–99)
Glucose-Capillary: 208 mg/dL — ABNORMAL HIGH (ref 70–99)
Glucose-Capillary: 124 mg/dL — ABNORMAL HIGH (ref 70–99)

## 2023-10-24 MED ORDER — K PHOS MONO-SOD PHOS DI & MONO 155-852-130 MG PO TABS
250.0000 mg | ORAL_TABLET | Freq: Once | ORAL | Status: AC
  Administered 2023-10-24: 250 mg via ORAL
  Filled 2023-10-24: qty 1

## 2023-10-24 MED ORDER — LUBIPROSTONE 24 MCG PO CAPS
24.0000 ug | ORAL_CAPSULE | Freq: Two times a day (BID) | ORAL | Status: DC
  Administered 2023-10-24 – 2023-10-27 (×6): 24 ug via ORAL
  Filled 2023-10-24 (×7): qty 1

## 2023-10-24 MED ORDER — POLYETHYLENE GLYCOL 3350 17 G PO PACK
17.0000 g | PACK | Freq: Two times a day (BID) | ORAL | Status: DC
  Administered 2023-10-24 – 2023-10-25 (×2): 17 g via ORAL
  Filled 2023-10-24 (×3): qty 1

## 2023-10-24 MED ORDER — LACTULOSE 10 GM/15ML PO SOLN
45.0000 g | Freq: Two times a day (BID) | ORAL | Status: AC
  Administered 2023-10-24: 45 g via ORAL
  Filled 2023-10-24 (×2): qty 90

## 2023-10-24 NOTE — Progress Notes (Addendum)
 Progress Note   Patient: Stacy Kirk FMW:[id number] DOB: Jul 16, 1945 DOA: 10/19/2023     5 DOS: the patient was seen and examined on 10/24/2023   Brief hospital course: GINETTE BRADWAY is a 78 y.o. female with medical history significant of dCHF, PE on Eliquis , HTN, DM, hypothyroidism, GI bleeding, CKD-3A, lymphedema, hard of hearing, ovarian cancer, lumbar spinal stenosis, s/p of surgery for left hip fracture 09/09/23, who presents with AMS and SOB.  Patient developed significant respite distress, VBG showed severe CO2 retention, he also had severe hypoxia.  Due to respite distress, she was admitted to the ICU stepdown unit and placed on BiPAP. Condition had improved, but the patient has severe constipation.  Working on the bowel before discharge   Principal Problem:   Acute respiratory failure with hypoxia and hypercapnia (HCC) Active Problems:   Acute metabolic encephalopathy   Hypotension   UTI (urinary tract infection)   Acute urinary retention   Chronic diastolic CHF (congestive heart failure) (HCC)   Hypertension   Pulmonary embolism (HCC)   Chronic kidney disease, stage 3a (HCC)   Hypothyroidism   Type II diabetes mellitus with renal manifestations (HCC)   Iron  deficiency anemia   Hypokalemia   Hypomagnesemia   Obesity (BMI 30-39.9)   Altered mental status   Acute on chronic respiratory failure with hypercapnia (HCC)   COPD with acute exacerbation (HCC)   Hypophosphatemia   Assessment and Plan: Acute respiratory failure with hypoxemia and hypercapnia. COPD exacerbation. Acute bronchitis with mucous plug. Patient requiring BiPAP since admission to the hospital, appreciate pulmonology consult. Respiratory failure appears to be due to COPD exacerbation and mucous plug. She had 20 years of smoking history, making COPD highly likely.  CT scan also showed significant mucous plug, this might be due to acute bronchitis.  Procalcitonin level less than 0.1, BNP level  normal.  There is no evidence of volume overload. She is treated with BiPAP, scheduled DuoNeb, oral prednisone .  Patient could not tolerate Mucomyst .  Oxygenation is better today from she has been off BiPAP, on 2 L oxygen.  Oxygen saturation better. Will continue Zithromax .  Rocephin  was started for possible UTI.  Will continue. Patient respiratory status has improved, will transfer out to medical floor. 7/26. Patient has crackles in the right base, repeat chest x-ray does not seem to be worse.  BNP and procalcitonin level still normal. Discontinue Zithromax , continue bronchodilator.  Completed 4 days steroids, will discontinue.  Condition has improved, off oxygen.     Acute metabolic encephalopathy. Possible baseline dementia. Spoke with the patient and husband, patient has some memory defect recently.  Worsening mental status at the time of admission due to severe CO2 retention.  Mental status has improved.   Hypotension. Essential hypertension. Hypotension resolved, discontinued midodrine . Blood pressure running high again, restarted metoprolol . Continue to follow blood pressure     Chronic diastolic congestive heart failure. No exacerbation at this time.  Restart metoprolol .   History of pulmonary emboli. Continue Eliquis .   Chronic kidney disease stage IIIa. Patient has slightly worsening renal function not consistent with acute kidney injury.  She received IV fluids, renal function has back to baseline.   Acute urinary retention with E. coli UTI ruled in. Urine culture grew low counts of E. coli, received Rocephin , will change to Keflex  for additional 3 days.   Constipation. KUB has increased stool burden, received the lactulose  senna.  She also received Fleet enema, soup suds enema.  Received 45 g of lactulose  twice  a day.  Has small amount of stool come out, repeat KUB still has significant bowel burden in the colon.  I will continue senna, increased dose of lactulose ,  MiraLAX .  Also add Amitiza . Multiple discussion with the patient, she will not take GoLytely .   Class I obesity with BMI 34.44. Diet and excise.   Failure to thrive. Functional decline. Patient has significant decline in her mental status recently, palliative care consult obtained.   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 poa Appears to be chronic, podiatry will see patient today.   Patient has seen by PT/OT, recommending nursing home placement.  But patient and husband does not want to go to nursing home, they want to go home with home care.      Subjective:  Patient still has significant constipation, multiple stool softeners, laxative, enema, and even small amount of stools.  No abdominal pain or nausea vomiting.  Physical Exam: Vitals:   10/23/23 1454 10/23/23 1950 10/24/23 0412 10/24/23 0736  BP: (!) 152/105 (!) 163/107 (!) 163/110 (!) 141/92  Pulse: 85 90 81 89  Resp: 19 15 16 19   Temp: 98.4 F (36.9 C) 98.1 F (36.7 C) 97.6 F (36.4 C) 97.9 F (36.6 C)  TempSrc:  Oral Oral   SpO2: 95% 94% 93% 94%  Weight:      Height:       General exam: Appears calm and comfortable  Respiratory system: Decreased breath sounds. Respiratory effort normal. Cardiovascular system: S1 & S2 heard, RRR. No JVD, murmurs, rubs, gallops or clicks. No pedal edema. Gastrointestinal system: Abdomen is nondistended, soft and nontender. No organomegaly or masses felt. Normal bowel sounds heard. Central nervous system: Alert and oriented x3. No focal neurological deficits. Extremities: Symmetric 5 x 5 power. Skin: No rashes, lesions or ulcers Psychiatry: Judgement and insight appear normal. Mood & affect appropriate.    Data Reviewed:  There are no new results to review at this time.  Family Communication: Husband updated over the phone.  Disposition: Status is: Inpatient Remains inpatient appropriate because: Severity of disease.     Time spent: 35  minutes  Author: Murvin Mana, MD 10/24/2023 2:51 PM  For on call review [email or web address].

## 2023-10-24 NOTE — Plan of Care (Signed)

## 2023-10-24 NOTE — Progress Notes (Signed)
 Orthopedic Tech Progress Note Patient Details:  [name] 1945-08-16 [id number]  Received a called regarding an ANKLE BRACE. Had to do a little digging to figure out which brace. [facility] does not come out for those type of braces so I read MD BAKER note and he just wants an ASO and they are in the materials of the hospital. I did call back and gave the secretary the lawson number [phone]) for that brace  ,Patient ID: Stacy Kirk, female   DOB: 1945-11-15, 78 y.o.   MRN: [id number]  Delanna LITTIE Pac 10/24/2023, 12:15 PM

## 2023-10-24 NOTE — Progress Notes (Addendum)
 Occupational Therapy Treatment Patient Details Name: Stacy Kirk MRN: [id number] DOB: 1946/03/28 Today's Date: 10/24/2023   History of present illness 78 y.o. female with medical history significant of dCHF, PE on Eliquis , HTN, DM, hypothyroidism, GI bleeding, CKD-3A, lymphedema, hard of hearing, ovarian cancer, lumbar spinal stenosis, s/p of surgery for left hip fracture 09/09/23, who presents with AMS and SOB.   OT comments  Pt seen for OT treatment on this date. Upon arrival to room pt seated EOB, agreeable to tx. Pt requires MODA for bed mobility, the pt presents with limited sitting tolerance, required MIN-MOD external support to remain sitting upright on the EOB during ADL grooming task. In adjunct with PRN MIN-MOD external support while sitting on the EOB, with setupA pt bathed her UB and donned fresh gown with MINA. Pt returned to bed level for LB bathing - MAXA to complete, pt able to assist with rolling with use of bed rails. MAXA for donning LLE prevlon boot at the end of session. Pt continues to be resistant to the idea of OOB mobility but on this date making improvements by acknowledging the need to prevent deconditioning since her is to return home at d/c. Pt reports her husband is full time caregiver for their adult son, stating limited support for herself once returning home. Pt making fair progress toward goals, will continue to follow POC. Discharge recommendation remains appropriate.        If plan is discharge home, recommend the following:  Two people to help with walking and/or transfers;Two people to help with bathing/dressing/bathroom;Supervision due to cognitive status   Equipment Recommendations  Hospital bed    Recommendations for Other Services      Precautions / Restrictions Precautions Precautions: Fall Recall of Precautions/Restrictions: Impaired Restrictions Weight Bearing Restrictions Per Provider Order: No Other Position/Activity Restrictions: Prevlon  boots on LLE to prevent further IR       Mobility Bed Mobility Overal bed mobility: Needs Assistance Bed Mobility: Supine to Sit, Sit to Supine     Supine to sit: Used rails, HOB elevated, Max assist, Mod assist Sit to supine: HOB elevated, Used rails, Mod assist   General bed mobility comments: Pt able to assist with UE via bed rails with verbal/tactile cues    Transfers                   General transfer comment: Pt refuesed getting OOB mobility agreeded to sit on the EOB during session     Balance Overall balance assessment: Needs assistance Sitting-balance support: Feet unsupported, Bilateral upper extremity supported Sitting balance-Leahy Scale: Poor Sitting balance - Comments: Limited sitting tolerance, required MIN-MOD external support to remain sitting upright on the EOB during ADL grooming task Postural control: Posterior lean                                 ADL either performed or assessed with clinical judgement   ADL Overall ADL's : Needs assistance/impaired     Grooming: Wash/dry hands;Wash/dry face;Set up;Sitting Grooming Details (indicate cue type and reason): Sitting on EOB Upper Body Bathing: Set up;Sitting Upper Body Bathing Details (indicate cue type and reason): MODA for sitting upright on the EOB with feet unsupported Lower Body Bathing: Maximal assistance;Bed level               Toileting- Clothing Manipulation and Hygiene: Maximal assistance Toileting - Clothing Manipulation Details (indicate cue type and reason): Anticipate  MAXA pericare       General ADL Comments: Increase in motivation to participate on this date    Communication Communication Communication: No apparent difficulties Factors Affecting Communication: Hearing impaired   Cognition Arousal: Alert Behavior During Therapy: WFL for tasks assessed/performed Cognition: History of cognitive impairments                                Following commands: Intact Following commands impaired: Follows one step commands with increased time      Cueing   Cueing Techniques: Verbal cues, Tactile cues  Exercises Exercises: Other exercises Other Exercises Other Exercises: Edu: benefits of OOB mobility to prevent deconditioning, safety concerns returning home with limited support           General Comments Noted yellow discharge from heels on bed sheet, R heel bandages remain on LE    Pertinent Vitals/ Pain       Pain Assessment Pain Assessment: No/denies pain                                                          Frequency  Min 2X/week        Progress Toward Goals  OT Goals(current goals can now be found in the care plan section)  Progress towards OT goals: Progressing toward goals  Acute Rehab OT Goals OT Goal Formulation: With family Time For Goal Achievement: 11/04/23 Potential to Achieve Goals: Fair ADL Goals Pt Will Transfer to Toilet: with mod assist;bedside commode Pt Will Perform Toileting - Clothing Manipulation and hygiene: with mod assist;sit to/from stand Pt/caregiver will Perform Home Exercise Program: Both right and left upper extremity;Increased ROM;Increased strength;With theraband;With Supervision;With written HEP provided  Plan      Co-evaluation                 AM-PAC OT 6 Clicks Daily Activity     Outcome Measure   Help from another person eating meals?: None Help from another person taking care of personal grooming?: A Little Help from another person toileting, which includes using toliet, bedpan, or urinal?: Total Help from another person bathing (including washing, rinsing, drying)?: A Lot Help from another person to put on and taking off regular upper body clothing?: A Lot Help from another person to put on and taking off regular lower body clothing?: A Lot 6 Click Score: 14    End of Session    OT Visit Diagnosis: Unsteadiness on  feet (R26.81);Repeated falls (R29.6);Muscle weakness (generalized) (M62.81);History of falling (Z91.81)   Activity Tolerance Patient tolerated treatment well   Patient Left in bed;with call bell/phone within reach;with bed alarm set   Nurse Communication Other (comment) (Heel discharge)        Time: 9160-9089 OT Time Calculation (min): 31 min  Charges: OT General Charges $OT Visit: 1 Visit OT Treatments $Self Care/Home Management : 23-37 mins  Larraine Colas M.S. OTR/L  10/24/23, 9:36 AM

## 2023-10-24 NOTE — Progress Notes (Signed)
 Spoke with Laurita, MD, requesting soap suds enema in place of ordered tap water enema.  Written ordered obtained via secure chat for same.  1500ml administered.  Awaiting results.

## 2023-10-25 DIAGNOSIS — J9601 Acute respiratory failure with hypoxia: Secondary | ICD-10-CM | POA: Diagnosis not present

## 2023-10-25 DIAGNOSIS — J9602 Acute respiratory failure with hypercapnia: Secondary | ICD-10-CM | POA: Diagnosis not present

## 2023-10-25 LAB — GLUCOSE, CAPILLARY
Glucose-Capillary: 98 mg/dL (ref 70–99)
Glucose-Capillary: 117 mg/dL — ABNORMAL HIGH (ref 70–99)
Glucose-Capillary: 106 mg/dL — ABNORMAL HIGH (ref 70–99)
Glucose-Capillary: 106 mg/dL — ABNORMAL HIGH (ref 70–99)

## 2023-10-25 LAB — MAGNESIUM: Magnesium: 1.7 mg/dL (ref 1.7–2.4)

## 2023-10-25 LAB — PHOSPHORUS: Phosphorus: 2.5 mg/dL (ref 2.5–4.6)

## 2023-10-25 MED ORDER — POLYETHYLENE GLYCOL 3350 17 G PO PACK
34.0000 g | PACK | ORAL | Status: AC
  Administered 2023-10-25 (×2): 34 g via ORAL
  Filled 2023-10-25 (×2): qty 2

## 2023-10-25 MED ORDER — HYDROCODONE-ACETAMINOPHEN 5-325 MG PO TABS: 1.0000 | ORAL_TABLET | Freq: Once | ORAL | Status: DC | PRN

## 2023-10-25 NOTE — Progress Notes (Signed)
 Physical Therapy Treatment Patient Details Name: Stacy Kirk MRN: [id number] DOB: July 22, 1945 Today's Date: 10/25/2023   History of Present Illness 78 y.o. female with medical history significant of dCHF, PE on Eliquis , HTN, DM, hypothyroidism, GI bleeding, CKD-3A, lymphedema, hard of hearing, ovarian cancer, lumbar spinal stenosis, s/p of surgery for left hip fracture 09/09/23, who presents with AMS and SOB.    PT Comments  Pt is minimally agreeable to session. Agreeable to sit at EOB, needs heavy assist for attempt and only able to maintain position with supports noted. Declines transfer attempts and is eager to dc home to her two cats. Unable to walk at this time. Will continue to progress.     If plan is discharge home, recommend the following: Two people to help with walking and/or transfers;Two people to help with bathing/dressing/bathroom;Help with stairs or ramp for entrance;Assist for transportation;Assistance with cooking/housework   Can travel by private vehicle        Equipment Recommendations  Other (comment)    Recommendations for Other Services       Precautions / Restrictions Precautions Precautions: Fall Recall of Precautions/Restrictions: Impaired Restrictions Weight Bearing Restrictions Per Provider Order: No Other Position/Activity Restrictions: Prevlon boots on LLE to prevent further IR. Ankle brace ordered per chart review. Currently not in room     Mobility  Bed Mobility Overal bed mobility: Needs Assistance Bed Mobility: Supine to Sit, Sit to Supine     Supine to sit: Max assist Sit to supine: Max assist   General bed mobility comments: needs assist for B LE and chux pad secondary to pain. Once seated, instability noted with use of bed features to promote upright posture. Heavy R side leaning    Transfers                   General transfer comment: declined to attempt transfer despite mulitple attempts. Would likely be hoyer lift     Ambulation/Gait                   Stairs             Wheelchair Mobility     Tilt Bed    Modified Rankin (Stroke Patients Only)       Balance Overall balance assessment: Needs assistance Sitting-balance support: Feet unsupported, Bilateral upper extremity supported Sitting balance-Leahy Scale: Poor                                      Communication Communication Communication: No apparent difficulties Factors Affecting Communication: Hearing impaired  Cognition Arousal: Alert Behavior During Therapy: WFL for tasks assessed/performed   PT - Cognitive impairments: No apparent impairments                       PT - Cognition Comments: pleasant but only agreeable to minimal movement Following commands: Intact Following commands impaired: Follows one step commands with increased time    Cueing Cueing Techniques: Verbal cues, Tactile cues  Exercises      General Comments        Pertinent Vitals/Pain Pain Assessment Pain Assessment: Faces Faces Pain Scale: Hurts whole lot Pain Location: LLE with movement Pain Descriptors / Indicators: Discomfort Pain Intervention(s): Limited activity within patient's tolerance, Repositioned    Home Living  Prior Function            PT Goals (current goals can now be found in the care plan section) Acute Rehab PT Goals Patient Stated Goal: to walk again PT Goal Formulation: With patient Time For Goal Achievement: 11/05/23 Potential to Achieve Goals: Fair Progress towards PT goals: Progressing toward goals    Frequency    Min 2X/week      PT Plan      Co-evaluation              AM-PAC PT 6 Clicks Mobility   Outcome Measure  Help needed turning from your back to your side while in a flat bed without using bedrails?: A Little Help needed moving from lying on your back to sitting on the side of a flat bed without using bedrails?:  A Lot Help needed moving to and from a bed to a chair (including a wheelchair)?: Total Help needed standing up from a chair using your arms (e.g., wheelchair or bedside chair)?: Total Help needed to walk in hospital room?: Total Help needed climbing 3-5 steps with a railing? : Total 6 Click Score: 9    End of Session   Activity Tolerance: Patient limited by pain Patient left: in bed;with call bell/phone within reach;with bed alarm set Nurse Communication: Mobility status PT Visit Diagnosis: Other abnormalities of gait and mobility (R26.89);Muscle weakness (generalized) (M62.81);History of falling (Z91.81);Pain Pain - Right/Left: Left Pain - part of body: Hip;Knee;Ankle and joints of foot     Time: 8946-8888 PT Time Calculation (min) (ACUTE ONLY): 18 min  Charges:    $Therapeutic Activity: 8-22 mins PT General Charges $$ ACUTE PT VISIT: 1 Visit                     Corean Dade, PT, DPT, GCS [phone]    Pedro Whiters 10/25/2023, 1:50 PM

## 2023-10-25 NOTE — TOC Progression Note (Signed)
 Transition of Care [facility]) - Progression Note    Patient Details  Name: Stacy Kirk MRN: [id number] Date of Birth: 02-Apr-1945  [facility] CM/SW Contact  Corean ONEIDA Haddock, RN Phone Number: 10/25/2023, 11:54 AM  Clinical Narrative:      Notified by Daphne with [facility] that patient is active with RN PT OT Darleene with [facility] update to cancel referral  Patient will require EMS transport at discharge                    Expected Discharge Plan and Services                                               Social Drivers of Health (SDOH) Interventions SDOH Screenings   Food Insecurity: No Food Insecurity (10/20/2023)  Recent Concern: Food Insecurity - Food Insecurity Present (09/27/2023)   Received from [facility]  Housing: Low Risk  (10/20/2023)  Transportation Needs: No Transportation Needs (10/20/2023)  Utilities: Not At Risk (10/20/2023)  Financial Resource Strain: Medium Risk (09/27/2023)   Received from [facility]  Social Connections: Unknown (10/20/2023)  Tobacco Use: Medium Risk (10/19/2023)    Readmission Risk Interventions     No data to display

## 2023-10-25 NOTE — Progress Notes (Signed)
 Physical Therapy and pt requesting pain medication prior to session.  Messaged Ellouise Haber, MD, requesting 1x dose of Norco prior to PT.  MD to place order into [facility].

## 2023-10-25 NOTE — Plan of Care (Signed)

## 2023-10-25 NOTE — Progress Notes (Signed)
 PROGRESS NOTE    Stacy Kirk  FMW:[id number] DOB: 09/03/1945 DOA: 10/19/2023 PCP: Wendee Lynwood HERO, NP  212A/212A-AA  LOS: 6 days   Brief hospital course: Stacy Kirk is a 78 y.o. female with medical history significant of dCHF, PE on Eliquis , HTN, DM, hypothyroidism, GI bleeding, CKD-3A, lymphedema, hard of hearing, ovarian cancer, lumbar spinal stenosis, s/p of surgery for left hip fracture 09/09/23, who presents with AMS and SOB.  Patient developed significant respite distress, VBG showed severe CO2 retention, he also had severe hypoxia.  Due to respite distress, she was admitted to the ICU stepdown unit and placed on BiPAP. Condition had improved, but the patient has severe constipation.  Working on the bowel before discharge  Assessment & Plan: Acute respiratory failure with hypoxemia and hypercapnia. COPD exacerbation. Acute bronchitis with mucous plug. Patient requiring BiPAP since admission to the hospital, appreciate pulmonology consult. Respiratory failure appears to be due to COPD exacerbation and mucous plug. She had 20 years of smoking history, making COPD highly likely.  CT scan also showed significant mucous plug, this might be due to acute bronchitis.  Procalcitonin level less than 0.1, BNP level normal.  There is no evidence of volume overload. She is treated with BiPAP, scheduled DuoNeb, oral prednisone .  Patient could not tolerate Mucomyst .  Received a course of azithromycin .   Acute metabolic encephalopathy. Possible baseline dementia. Spoke with the patient and husband, patient has some memory defect recently.  Worsening mental status at the time of admission due to severe CO2 retention.  Mental status has improved, but appeared to have poor insight.   Hypotension. Hypotension resolved, discontinued midodrine .  Essential hypertension. Blood pressure running high again, restarted metoprolol . --monitor   Chronic diastolic congestive heart failure. No  exacerbation at this time.   --cont Toprol    History of pulmonary emboli. Continue Eliquis .   Chronic kidney disease stage IIIa. Patient has slightly worsening renal function not consistent with acute kidney injury.  She received IV fluids, renal function has back to baseline.   Acute urinary retention with E. coli UTI ruled in. --ceftriaxone  to Keflex    Constipation. KUB has increased stool burden, received the lactulose  senna.  She also received Fleet enema, soup suds enema.  Received 45 g of lactulose  twice a day.  Has small amount of stool come out, repeat KUB still has significant bowel burden in the colon.  --aggressive miralax  by adding powder to pt's drinks   Class I obesity with BMI 34.44.   Failure to thrive. Functional decline. Patient has significant decline in her mental status recently, palliative care consult obtained.   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 poa Appears to be chronic --podiatry consulted --wound care per order --Prevalon boot to the left heel   Hyperglycemia due to steroid use --d/c BG checks and SSI, no more need   DVT prophylaxis: On:Eliquis  Code Status: DNR  Family Communication:  Level of care: Med-Surg Dispo:   The patient is from: home Anticipated d/c is to: home.  Pt and family declined SNF rehab Anticipated d/c date is: tomorrow   Subjective and Interval History:  Pt still hasn't had a large BM.  Pt appeared to have poor insight.   Objective: Vitals:   10/25/23 0732 10/25/23 0746 10/25/23 1751 10/25/23 2049  BP: 109/60 (!) 159/96  (!) 154/92  Pulse: 94 91 93 93  Resp: 18 16 18 18   Temp: (!) 100.4 F (38 C) 98.5 F (36.9 C) 98.5 F (36.9 C) 98.7  F (37.1 C)  TempSrc: Oral Oral Oral Oral  SpO2: 95% 94% 92% 93%  Weight:      Height:        Intake/Output Summary (Last 24 hours) at 10/25/2023 2134 Last data filed at 10/25/2023 2056 Gross per 24 hour  Intake 720 ml  Output 2200 ml  Net -1480 ml   Filed  Weights   10/22/23 0429 10/23/23 0427 10/25/23 0418  Weight: 88.2 kg 88.7 kg 89.5 kg    Examination:   Constitutional: NAD, alert, oriented to person and place HEENT: conjunctivae and lids normal, EOMI CV: No cyanosis.   RESP: normal respiratory effort, on RA Neuro: II - XII grossly intact.   Psych: irritated mood and affect.   Foley present.   Data Reviewed: I have personally reviewed labs and imaging studies  Time spent: 50 minutes  Ellouise Haber, MD [facility] If 7PM-7AM, please contact night-coverage 10/25/2023, 9:34 PM

## 2023-10-26 DIAGNOSIS — J9601 Acute respiratory failure with hypoxia: Secondary | ICD-10-CM | POA: Diagnosis not present

## 2023-10-26 DIAGNOSIS — J9602 Acute respiratory failure with hypercapnia: Secondary | ICD-10-CM | POA: Diagnosis not present

## 2023-10-26 LAB — GLUCOSE, CAPILLARY
Glucose-Capillary: 100 mg/dL — ABNORMAL HIGH (ref 70–99)
Glucose-Capillary: 115 mg/dL — ABNORMAL HIGH (ref 70–99)

## 2023-10-26 MED ORDER — COLLAGENASE 250 UNIT/GM EX OINT: TOPICAL_OINTMENT | Freq: Every day | CUTANEOUS | Status: AC

## 2023-10-26 MED ORDER — SENNOSIDES-DOCUSATE SODIUM 8.6-50 MG PO TABS: 2.0000 | ORAL_TABLET | Freq: Two times a day (BID) | ORAL | 1 refills | Status: AC | PRN

## 2023-10-26 MED ORDER — SPIRONOLACTONE 25 MG PO TABS
25.0000 mg | ORAL_TABLET | Freq: Every day | ORAL | Status: DC
  Administered 2023-10-26 – 2023-10-27 (×2): 25 mg via ORAL
  Filled 2023-10-26 (×2): qty 1

## 2023-10-26 MED ORDER — POLYETHYLENE GLYCOL 3350 17 G PO PACK
34.0000 g | PACK | ORAL | Status: AC
  Administered 2023-10-26: 34 g via ORAL
  Filled 2023-10-26: qty 2

## 2023-10-26 MED ORDER — TORSEMIDE 20 MG PO TABS
40.0000 mg | ORAL_TABLET | Freq: Every day | ORAL | Status: DC
  Administered 2023-10-26 – 2023-10-27 (×2): 40 mg via ORAL
  Filled 2023-10-26 (×2): qty 2

## 2023-10-26 MED ORDER — POLYETHYLENE GLYCOL 3350 17 G PO PACK: 17.0000 g | PACK | Freq: Two times a day (BID) | ORAL | Status: DC

## 2023-10-26 NOTE — Plan of Care (Signed)
  Problem: Education: Goal: Knowledge of General Education information will improve Description: Including pain rating scale, medication(s)/side effects and non-pharmacologic comfort measures Outcome: Progressing   Problem: Health Behavior/Discharge Planning: Goal: Ability to manage health-related needs will improve Outcome: Progressing   Problem: Skin Integrity: Goal: Risk for impaired skin integrity will decrease Outcome: Not Progressing   Problem: Activity: Goal: Risk for activity intolerance will decrease Outcome: Not Progressing   Problem: Elimination: Goal: Will not experience complications related to bowel motility Outcome: Not Progressing

## 2023-10-26 NOTE — TOC Transition Note (Signed)
 Transition of Care [facility]) - Discharge Note   Patient Details  Name: Stacy Kirk MRN: [id number] Date of Birth: 01/24/1946  Transition of Care [facility]) CM/SW Contact:  Asberry CHRISTELLA Jaksch, RN Phone Number: 10/26/2023, 2:11 PM   Clinical Narrative:    Patient will DC to: Home  Anticipated DC date: 10/26/2023 Family notified: Kayla (spouse)  Transport by: [facility]  Per MD patient ready for DC home . RN, patient, and patient's family notified of DC. DC packet on chart. Ambulance transport requested for patient.   TOC signing off.    Final next level of care: Home w Home Health Services Barriers to Discharge: No Barriers Identified   Patient Goals and CMS Choice Patient states their goals for this hospitalization and ongoing recovery are:: Going home          Discharge Placement                       Discharge Plan and Services Additional resources added to the After Visit Summary for                              [facility] Agency: [facility] (Patient already established) Date HH Agency Contacted: 10/26/23   Representative spoke with at [facility] Agency: [place]   Social Drivers of Health (SDOH) Interventions SDOH Screenings   Food Insecurity: No Food Insecurity (10/20/2023)  Recent Concern: Food Insecurity - Food Insecurity Present (09/27/2023)   Received from [facility]  Housing: Low Risk  (10/20/2023)  Transportation Needs: No Transportation Needs (10/20/2023)  Utilities: Not At Risk (10/20/2023)  Financial Resource Strain: Medium Risk (09/27/2023)   Received from [facility]  Social Connections: Unknown (10/20/2023)  Tobacco Use: Medium Risk (10/19/2023)     Readmission Risk Interventions     No data to display

## 2023-10-26 NOTE — Progress Notes (Addendum)
 PROGRESS NOTE    Jezlyn Westerfield Fahrner  FMW:[id number] DOB: Aug 23, 1945 DOA: 10/19/2023 PCP: Wendee Lynwood HERO, NP  [id number]/[id number]-AA  LOS: 7 days   Brief hospital course: Saarah Dewing Salzman is a 78 y.o. female with medical history significant of dCHF, PE on Eliquis , HTN, DM, hypothyroidism, GI bleeding, CKD-3A, lymphedema, hard of hearing, ovarian cancer, lumbar spinal stenosis, s/p of surgery for left hip fracture 09/09/23, who presents with AMS and SOB.  Patient developed significant respite distress, VBG showed severe CO2 retention, he also had severe hypoxia.  Due to respite distress, she was admitted to the ICU stepdown unit and placed on BiPAP. Condition had improved, but the patient has severe constipation.  Working on the bowel before discharge  Assessment & Plan: Acute respiratory failure with hypoxemia and hypercapnia. COPD exacerbation. Acute bronchitis with mucous plug. Patient requiring BiPAP since admission to the hospital, appreciate pulmonology consult. Respiratory failure appears to be due to COPD exacerbation and mucous plug. She had 20 years of smoking history, making COPD highly likely.  CT scan also showed significant mucous plug, this might be due to acute bronchitis.  Procalcitonin level less than 0.1, BNP level normal.  There is no evidence of volume overload. She is treated with BiPAP, scheduled DuoNeb, oral prednisone .  Patient could not tolerate Mucomyst .  Received a course of azithromycin .   Acute metabolic encephalopathy. Possible baseline dementia. Spoke with the patient and husband, patient has some memory defect recently.  Worsening mental status at the time of admission due to severe CO2 retention.  Mental status has improved, but appeared to have poor insight.   Hypotension. Hypotension resolved, discontinued midodrine .  Essential hypertension. Blood pressure running high again --resume home aldactone  and torsemide  --d/c Toprol  due to low HR   Chronic diastolic  congestive heart failure. No exacerbation at this time.   --d/c Toprol  due to low HR   History of pulmonary emboli. Continue Eliquis .   Chronic kidney disease stage IIIa. Patient has slightly worsening renal function not consistent with acute kidney injury.  She received IV fluids, renal function has back to baseline.   E. coli UTI ruled in. --finished course with ceftriaxone  and Keflex   Acute urinary retention --remove Foley today for voiding trial --bladder scan q6h for 1 day  Constipation. KUB has increased stool burden, received the lactulose  senna.  She also received Fleet enema, soup suds enema.  Received 45 g of lactulose  twice a day.  Has small amount of stool come out, repeat KUB still has significant bowel burden in the colon.  --pt refused bowel regimen   Class I obesity with BMI 34.44.   Failure to thrive. Functional decline. Patient has significant decline in her mental status recently, palliative care consult obtained.   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 poa Appears to be chronic --podiatry consulted --wound care per order --Prevalon boot to the left heel  --outpatient f/u with podiatry  Hyperglycemia due to steroid use --d/c'ed BG checks and SSI, no more need   DVT prophylaxis: On:Eliquis  Code Status: DNR  Family Communication:  Level of care: Med-Surg Dispo:   The patient is from: home Anticipated d/c is to: home.  Pt and family declined SNF rehab Anticipated d/c date is: tomorrow   Subjective and Interval History:  Pt continued to refuse bowel regimen.  Foley removed today for voiding trial.     Objective: Vitals:   10/26/23 0733 10/26/23 0913 10/26/23 1414 10/26/23 1434  BP: (!) 158/94 (!) 158/94 (!) 160/97  Pulse: 94 90 (!) 51 (!) 45  Resp: 18  (!) 26 20  Temp: 98.2 F (36.8 C)  98.8 F (37.1 C)   TempSrc: Oral  Oral   SpO2: 93%  92% 94%  Weight:      Height:        Intake/Output Summary (Last 24 hours) at  10/26/2023 1919 Last data filed at 10/26/2023 1733 Gross per 24 hour  Intake 660 ml  Output 2200 ml  Net -1540 ml   Filed Weights   10/23/23 0427 10/25/23 0418 10/26/23 0415  Weight: 88.7 kg 89.5 kg 86.7 kg    Examination:   Constitutional: NAD, sleepy but arousable HEENT: conjunctivae and lids normal, EOMI CV: No cyanosis.   RESP: normal respiratory effort, on RA Foley present.   Data Reviewed: I have personally reviewed labs and imaging studies  Time spent: 50 minutes  Ellouise Haber, MD [facility] If 7PM-7AM, please contact night-coverage 10/26/2023, 7:19 PM

## 2023-10-26 NOTE — Progress Notes (Signed)
 Sent Dr. Awanda secure chat and made her aware that patient's pulse rate is in the mid 40s and blood pressure is 160/97 and was given her long acting metoprolol  this morning. Also made MD aware that patient continues to refuse ordered miralax . MD acknowledged all mentioned above and stated that she would discontinue metoprolol  and order home meds of aldactone  and torsemide .

## 2023-10-27 ENCOUNTER — Telehealth: Payer: Self-pay

## 2023-10-27 ENCOUNTER — Other Ambulatory Visit: Payer: Self-pay

## 2023-10-27 DIAGNOSIS — J9601 Acute respiratory failure with hypoxia: Secondary | ICD-10-CM | POA: Diagnosis not present

## 2023-10-27 DIAGNOSIS — J9602 Acute respiratory failure with hypercapnia: Secondary | ICD-10-CM | POA: Diagnosis not present

## 2023-10-27 LAB — GLUCOSE, CAPILLARY: Glucose-Capillary: 121 mg/dL — ABNORMAL HIGH (ref 70–99)

## 2023-10-27 MED ORDER — SPIRONOLACTONE 25 MG PO TABS
25.0000 mg | ORAL_TABLET | Freq: Every day | ORAL | 2 refills | Status: AC
  Filled 2023-10-27: qty 30, 30d supply, fill #0

## 2023-10-27 MED ORDER — SPIRONOLACTONE 25 MG PO TABS: 25.0000 mg | ORAL_TABLET | Freq: Every day | ORAL | Status: DC

## 2023-10-27 MED ORDER — HYDRALAZINE HCL 20 MG/ML IJ SOLN: 10.0000 mg | Freq: Four times a day (QID) | INTRAMUSCULAR | Status: DC | PRN

## 2023-10-27 MED ORDER — METOPROLOL SUCCINATE ER 25 MG PO TB24
25.0000 mg | ORAL_TABLET | Freq: Every day | ORAL | Status: DC
  Administered 2023-10-27: 25 mg via ORAL
  Filled 2023-10-27: qty 1

## 2023-10-27 MED ORDER — TORSEMIDE 20 MG PO TABS
40.0000 mg | ORAL_TABLET | Freq: Every day | ORAL | 2 refills | Status: AC
  Filled 2023-10-27: qty 180, 90d supply, fill #0

## 2023-10-27 NOTE — Plan of Care (Signed)
 Pt D/C to home with [facility] and Spouse. AVS provided, called spouse and reviewed AVS. Wound care reviewed with spouse over phone. EMS to transport. IV removed, tele removed, belongings returned. No other needs or questions at this time. BP (!) 112/96 (BP Location: Left Wrist)   Pulse 96   Temp 97.8 F (36.6 C) (Oral)   Resp 17   Ht 5' 2 (1.575 m)   Wt 84.5 kg   SpO2 97%   BMI 34.07 kg/m   Stacy DEL Johnson08/01/25 11:51 AM

## 2023-10-27 NOTE — Discharge Summary (Addendum)
 Physician Discharge Summary   Stacy Kirk  female DOB: 1946-02-27  FMW:[id number]  PCP: Wendee Lynwood HERO, NP  Admit date: 10/19/2023 Discharge date: 10/27/2023  Admitted From: home Disposition:  home.  Pt and husband declined SNF rehab. Home Health: Yes CODE STATUS: DNR  Discharge Instructions     Discharge wound care:   Complete by: As directed    Cleanse left heel ulcer with water.  Apply Santyl  to the wound followed by Mepitel foam border dressing daily. Place left foot in Prevalon boot to offload pressure. [facility] Course:  For full details, please see H&P, progress notes, consult notes and ancillary notes.  Briefly,  Stacy Kirk is a 78 y.o. female with medical history significant of dCHF, PE on Eliquis , HTN, DM, hypothyroidism, GI bleeding, CKD-3A, lymphedema, ovarian cancer, lumbar spinal stenosis, surgery for left hip fracture 09/09/23, who presented with AMS and SOB.   Patient developed significant respite distress, VBG showed severe CO2 retention, also had severe hypoxia.  Due to respite distress, she was admitted to the ICU stepdown unit and placed on BiPAP.  Acute respiratory failure with hypoxemia and hypercapnia. COPD exacerbation. Acute bronchitis with mucous plug. pulmonology consulted. Respiratory failure appears to be due to COPD exacerbation and mucous plug. She had 20 years of smoking history, making COPD highly likely.  CT scan also showed significant mucous plug, this might be due to acute bronchitis.  Procalcitonin level less than 0.1, BNP level normal.  There is no evidence of volume overload. She is treated with BiPAP, scheduled DuoNeb, oral prednisone .  Patient could not tolerate Mucomyst .  Received a course of azithromycin . --pt was sating well on room air prior to discharge.   Acute metabolic encephalopathy. Possible baseline dementia. Per husband, patient has some memory defect recently.  Worsening mental status at the time  of admission due to severe CO2 retention.  Mental status has improved, but appeared to have poor insight.   Hypotension. Hypotension resolved, discontinued midodrine .   Essential hypertension. Blood pressure trending up prior to discharge. --resumed home aldactone  and torsemide  --Not taking Toprol  PTA, resumed  Chronic diastolic congestive heart failure. No exacerbation at this time.     History of pulmonary emboli. Continue Eliquis .   Chronic kidney disease stage IIIa. Patient has slightly worsening renal function, not consistent with acute kidney injury.  She received IV fluids, renal function has back to baseline.   E. coli UTI ruled in. --finished course with ceftriaxone  and Keflex    Acute urinary retention --remove Foley on 7/31 for voiding trial, found to be still retaining.  Foley re-inserted.  Pt will follow up with outpatient urology, urology notified.   Constipation. KUB has increased stool burden, received the lactulose  senna.  She also received Fleet enema, soup suds enema.  Received 45 g of lactulose  twice a day.  Has small amount of stool come out, repeat KUB still has significant bowel burden in the colon, however, pt refused further bowel regimen with Miralax .   Class I obesity with BMI 34.44.   Failure to thrive. Functional decline. Patient has significant decline in her mental status recently, palliative care consult obtained.   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 poa Appears to be chronic --podiatry consulted, rec wound care per order --Prevalon boot to the left heel  --outpatient f/u with podiatry   Hx of DM2 Hyperglycemia due to steroid use --A1c 5.1.  home metformin  d/c'ed.   Discharge Diagnoses:  Principal  Problem:   Acute respiratory failure with hypoxia and hypercapnia (HCC) Active Problems:   Acute metabolic encephalopathy   Hypotension   UTI (urinary tract infection)   Acute urinary retention   Chronic diastolic CHF  (congestive heart failure) (HCC)   Hypertension   Pulmonary embolism (HCC)   Chronic kidney disease, stage 3a (HCC)   Hypothyroidism   Type II diabetes mellitus with renal manifestations (HCC)   Iron  deficiency anemia   Hypokalemia   Hypomagnesemia   Obesity (BMI 30-39.9)   Altered mental status   Acute on chronic respiratory failure with hypercapnia (HCC)   COPD with acute exacerbation (HCC)   Hypophosphatemia   30 Day Unplanned Readmission Risk Score    Flowsheet Row ED to Hosp-Admission (Current) from 10/19/2023 in [facility]  30 Day Unplanned Readmission Risk Score (%) 23.23 Filed at 10/27/2023 0801    This score is the patient's risk of an unplanned readmission within 30 days of being discharged (0 -100%). The score is based on dignosis, age, lab data, medications, orders, and past utilization.   Low:  0-14.9   Medium: 15-21.9   High: 22-29.9   Extreme: 30 and above         Discharge Instructions:  Allergies as of 10/27/2023       Reactions   Erythromycin Diarrhea, Other (See Comments)   Tears her stomach up. Major digestive upset.   Tramadol Shortness Of Breath, Other (See Comments)   Felt like lungs filled up; unable to lay down   Albumin (human) Other (See Comments)   Pt unsure of reaction   Aspirin  Itching, Other (See Comments)   Caused lethargy, also   Codeine Itching, Other (See Comments)   Cause lethargy, also   Cortisone Other (See Comments)   NO STEROID INJECTIONS; headaches   Linzess  [linaclotide ] Diarrhea   Propoxyphene Other (See Comments)   Terrible reaction; headaches   Simvastatin Itching, Other (See Comments)   Severe muscle aches   Triamcinolone Acetonide Other (See Comments)   headache        Medication List     STOP taking these medications    fluticasone  50 MCG/ACT nasal spray Commonly known as: FLONASE    HYDROcodone -acetaminophen  5-325 MG tablet Commonly known as: NORCO/VICODIN    metFORMIN  500 MG tablet Commonly known as: GLUCOPHAGE        TAKE these medications    acetaminophen  500 MG tablet Commonly known as: TYLENOL  Take 1,000 mg by mouth every 6 (six) hours as needed (FOR PAIN.).   cetirizine  10 MG tablet Commonly known as: ZYRTEC  Take 1 tablet (10 mg total) by mouth daily.   collagenase  250 UNIT/GM ointment Commonly known as: SANTYL  Apply topically daily.   Eliquis  5 MG Tabs tablet Generic drug: apixaban  TAKE 1 TABLET BY MOUTH TWICE A DAY   ferrous sulfate  325 (65 FE) MG tablet TAKE 325 MG BY MOUTH DAILY.   lactose free nutrition Liqd Take 237 mLs by mouth 2 (two) times daily between meals.   levothyroxine  100 MCG tablet Commonly known as: SYNTHROID  TAKE 1 TABLET BY MOUTH EVERY DAY   lidocaine  5 % Commonly known as: LIDODERM  Place 1-2 patches onto the skin at bedtime. Remove & Discard patch within 12 hours or as directed by MD   methocarbamol  500 MG tablet Commonly known as: ROBAXIN  Take 1 tablet (500 mg total) by mouth every 8 (eight) hours as needed for muscle spasms.   metoprolol  succinate 25 MG 24 hr tablet Commonly known as:  TOPROL -XL Take 1 tablet (25 mg total) by mouth daily.   multivitamin Tabs tablet Take 1 tablet by mouth at bedtime.   pantoprazole  40 MG tablet Commonly known as: PROTONIX  TAKE 1 TABLET BY MOUTH EVERY DAY   polyethylene glycol 17 g packet Commonly known as: MIRALAX  / GLYCOLAX  Take 17 g by mouth 2 (two) times daily. With a glass of fluid.   senna-docusate 8.6-50 MG tablet Commonly known as: Senokot-S Take 2 tablets by mouth 2 (two) times daily as needed for mild constipation.   spironolactone  25 MG tablet Commonly known as: ALDACTONE  Take 1 tablet (25 mg total) by mouth daily. Home med.   torsemide  20 MG tablet Commonly known as: DEMADEX  Take 2 tablets (40 mg total) by mouth daily. Home med.               Discharge Care Instructions  (From admission, onward)           Start      Ordered   10/27/23 0000  Discharge wound care:       Comments: Cleanse left heel ulcer with water.  Apply Santyl  to the wound followed by Mepitel foam border dressing daily. Place left foot in Prevalon boot to offload pressure. - -   10/27/23 9164             Follow-up Information     Wendee Lynwood HERO, NP. Go in 1 week(s).   Specialties: Nurse Practitioner, Family Medicine Why: appt. on 11/02/23 @9 :40am Contact information: [address] [place] [address] [phone]         Lennie Barter, DPM Follow up in 1 week(s).   Specialty: Podiatry Why: for your heel ulcer.  go to appt. on 11/03/23 @9 :45am in [place] Contact information: [address] [place] [address] [phone]         Maurine Lukes, PA-C Follow up.   Specialty: Urology Why: Foley management Contact information: [address] [place] [address] [phone]                 Allergies  Allergen Reactions   Erythromycin Diarrhea and Other (See Comments)    Tears her stomach up. Major digestive upset.   Tramadol Shortness Of Breath and Other (See Comments)    Felt like lungs filled up; unable to lay down   Albumin (Human) Other (See Comments)    Pt unsure of reaction   Aspirin  Itching and Other (See Comments)    Caused lethargy, also   Codeine Itching and Other (See Comments)    Cause lethargy, also   Cortisone Other (See Comments)    NO STEROID INJECTIONS; headaches   Linzess  [Linaclotide ] Diarrhea   Propoxyphene Other (See Comments)    Terrible reaction; headaches   Simvastatin Itching and Other (See Comments)    Severe muscle aches   Triamcinolone Acetonide Other (See Comments)    headache     The results of significant diagnostics from this hospitalization (including imaging, microbiology, ancillary and laboratory) are listed below for reference.   Consultations:   Procedures/Studies: DG Abd 1 View Result Date:  10/24/2023 CLINICAL DATA:  Fecal impaction EXAM: ABDOMEN - 1 VIEW COMPARISON:  10/20/2023 FINDINGS: No sign of bowel obstruction. There is a moderate amount of fecal matter throughout the colon. No sign of free air. Previous cholecystectomy clips. Previous lumbar fusion. Scoliosis and degenerative change of the spine. ORIF of the left hip. IMPRESSION: Moderate amount of fecal matter throughout the colon. No sign  of bowel obstruction. Electronically Signed   By: Oneil Officer M.D.   On: 10/24/2023 13:18   DG Chest Port 1 View Result Date: 10/21/2023 CLINICAL DATA:  Aspiration pneumonia EXAM: PORTABLE CHEST 1 VIEW COMPARISON:  10/19/2023 FINDINGS: Atherosclerotic calcification of the aortic arch. The patient is rotated to the right on today's radiograph, reducing diagnostic sensitivity and specificity. Moderate enlargement of the cardiopericardial silhouette. Indistinct pulmonary vasculature suggests pulmonary venous hypertension. Bandlike densities at the lung bases favor atelectasis. Airway thickening is present, suggesting bronchitis or reactive airways disease. Degenerative glenohumeral arthropathy bilaterally. IMPRESSION: 1. Moderate enlargement of the cardiopericardial silhouette with indistinct pulmonary vasculature suggesting pulmonary venous hypertension. 2. Airway thickening is present, suggesting bronchitis or reactive airways disease. 3. Bandlike densities at the lung bases favor atelectasis. 4. Aortic Atherosclerosis (ICD10-I70.0). Electronically Signed   By: Ryan Salvage M.D.   On: 10/21/2023 12:31   US  RENAL Result Date: 10/21/2023 CLINICAL DATA:  Acute renal failure. EXAM: RENAL / URINARY TRACT ULTRASOUND COMPLETE COMPARISON:  None Available. FINDINGS: Right Kidney: Renal measurements: 11.7 x 4.5 x 4.9 cm = volume: 134 mL. Echogenicity within normal limits. No mass or hydronephrosis visualized. Left Kidney: Renal measurements: 11.2 x 5.5 x 4.9 cm = volume: 157 mL. Echogenicity within normal  limits. No mass or hydronephrosis visualized. Bladder: The bladder is decompressed by a Foley catheter. Other: None. IMPRESSION: Normal appearance of the kidneys. Bladder decompressed by a Foley catheter. Electronically Signed   By: Marcey Moan M.D.   On: 10/21/2023 11:35   DG Foot Complete Left Result Date: 10/20/2023 CLINICAL DATA:  Left foot pain EXAM: LEFT FOOT - COMPLETE 3 VIEW COMPARISON:  None Available. FINDINGS: The foot is held in plantar flexion at the ankle and dorsiflexion at the metatarsal-phalangeal joints. There is no evidence of acute fracture allowing for patient positioning. Chronic-appearing fracture deformity of the fifth metatarsal neck. The second toe middle phalanx appears subluxated in the plantar direction relative to the proximal phalanx. Soft tissues are unremarkable. IMPRESSION: 1. The foot is held in plantar flexion at the ankle and dorsiflexion at the metatarsal-phalangeal joints. 2. The second toe middle phalanx appears subluxated in the plantar direction relative to the proximal phalanx. 3. No evidence of acute fracture. Electronically Signed   By: Limin  Xu M.D.   On: 10/20/2023 12:39   DG Abd 1 View Result Date: 10/20/2023 CLINICAL DATA:  Abdominal distension. EXAM: ABDOMEN - 1 VIEW COMPARISON:  August 30, 2010. FINDINGS: No abnormal bowel dilatation is noted. Moderate amount of stool seen throughout the colon. Postsurgical changes seen involving lower lumbar spine. IMPRESSION: Moderate stool burden.  No abnormal bowel dilatation. Electronically Signed   By: Lynwood Landy Raddle M.D.   On: 10/20/2023 08:24   CT ABDOMEN PELVIS W CONTRAST Result Date: 10/19/2023 CLINICAL DATA:  Abdominal pain, acute, nonlocalized EXAM: CT ABDOMEN AND PELVIS WITH CONTRAST TECHNIQUE: Multidetector CT imaging of the abdomen and pelvis was performed using the standard protocol following bolus administration of intravenous contrast. RADIATION DOSE REDUCTION: This exam was performed according to the  departmental dose-optimization program which includes automated exposure control, adjustment of the mA and/or kV according to patient size and/or use of iterative reconstruction technique. CONTRAST:  75mL OMNIPAQUE  IOHEXOL  350 MG/ML SOLN COMPARISON:  10/21/2022. FINDINGS: Lower chest: Mild bibasilar linear atelectasis/scarring. Dependent changes at the posterior lung bases. Coronary artery and aortic valve calcifications. Hepatobiliary: No suspicious focal hepatic lesion. Status post cholecystectomy. No biliary dilatation. Pancreas: Unremarkable. No pancreatic ductal dilatation or surrounding inflammatory changes.  Spleen: Normal in size without focal abnormality. Adrenals/Urinary Tract: Chronic 2 cm benign right adrenal nodule, unchanged since 2012, for which no follow-up imaging is recommended. Left adrenal gland is unremarkable. Kidneys enhance symmetrically. No renal or ureteral calculi. The bladder is markedly distended with moderate fullness of the bilateral ureters and collecting systems. Stomach/Bowel: Small to moderate hiatal hernia. Small bowel is grossly unremarkable. No evidence of obstruction or focal inflammatory changes. Descending and sigmoid colonic diverticulosis without evidence of acute diverticulitis. Moderate volume of stool distends the rectum up to 7.5 cm in diameter. Vascular/Lymphatic: Nonaneurysmal abdominal aorta. Extensive calcified and noncalcified atheromatous plaque extending throughout the abdominal aorta and its major branches. Similar bulky marrow plaque or thrombus is again noted along the left proximal abdominal aorta (series 3, image 23). No enlarged abdominal or pelvic lymph nodes. Reproductive: Status post hysterectomy. No adnexal masses. Other: No abdominopelvic ascites.  No intraperitoneal free air. Musculoskeletal: Transitional anatomy, 4 non-rib-bearing lumbar type vertebral bodies. Previously lower lumbar fusion has been designated as L4-L5. Interval progression of  superior endplate compression deformity of the L2 vertebral body now demonstrating approximately 45-50% height loss, compared to 30% previously. Status post ORIF of a left proximal femur intertrochanteric fracture. Diffuse osseous demineralization. IMPRESSION: 1. Bladder is markedly distended with moderate fullness of the bilateral ureters and collecting systems. No evidence of renal or ureteral calculi. Recommend correlation for bladder outlet obstruction. 2. Descending sigmoid colonic diverticulosis without evidence of acute diverticulitis. 3. Moderate volume of stool distending the rectum. 4. Small to moderate hiatal hernia. 5. Interval progression of superior endplate compression deformity of the L2 vertebral body now demonstrating approximately 45-50% height loss, compared to 30% previously. Similar mild retropulsion of the posterosuperior endplate. 6. Status post ORIF of a left proximal femur fracture. 7.  Aortic Atherosclerosis (ICD10-I70.0). Electronically Signed   By: Harrietta Sherry M.D.   On: 10/19/2023 18:57   CT Angio Chest PE W/Cm &/Or Wo Cm Result Date: 10/19/2023 CLINICAL DATA:  Pulmonary embolism (PE) suspected, high prob Shortness of breath. Recently discharged home after rehab from leg injury. EXAM: CT ANGIOGRAPHY CHEST WITH CONTRAST TECHNIQUE: Multidetector CT imaging of the chest was performed using the standard protocol during bolus administration of intravenous contrast. Multiplanar CT image reconstructions and MIPs were obtained to evaluate the vascular anatomy. RADIATION DOSE REDUCTION: This exam was performed according to the departmental dose-optimization program which includes automated exposure control, adjustment of the mA and/or kV according to patient size and/or use of iterative reconstruction technique. CONTRAST:  75mL OMNIPAQUE  IOHEXOL  350 MG/ML SOLN COMPARISON:  Radiograph earlier today.  Chest CTA 10/04/2022 FINDINGS: Cardiovascular: There are no filling defects within the  pulmonary arteries to suggest pulmonary embolus. Mild cardiomegaly. Coronary artery calcifications. Aortic atherosclerosis without aneurysm. No contrast in the aorta and to assess for aortic abnormalities. No pericardial effusion. Mediastinum/Nodes: No enlarged mediastinal or hilar lymph nodes. Moderate-sized hiatal hernia. No esophageal wall thickening. Lungs/Pleura: Mild dependent and perifissural atelectasis within both lungs. No significant pleural effusion. 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 Bronchial thickening in the lower lobes with mucoid impaction on the left. No pleural effusion. Upper Abdomen: Assessed on concurrent abdominopelvic CT, performed concurrently, reported separately. Musculoskeletal: Exaggerated thoracic kyphosis with diffuse thoracic spondylosis. There are no acute or suspicious osseous abnormalities. Review of the MIP images confirms the above findings. IMPRESSION: 1. No pulmonary embolus. 2. Scattered atelectasis. Bronchial thickening in the lower lobes with mucoid impaction on the left. 3. 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 4. Moderate-sized hiatal hernia. Aortic Atherosclerosis (ICD10-I70.0). Electronically Signed   By: Andrea Gasman M.D.   On: 10/19/2023 18:46   CT Head Wo Contrast Result Date: 10/19/2023 CLINICAL DATA:  Mental status change, unknown cause Patient to ED via [facility] from home for AMS and SOB x3 days. Pt recently dc'd home from rehab after left leg injury requiring surgery in June. Pt bed bound since. Hx CHF. Initially 87% on RA, placed on 4L [place]. Pt alert to place and self. NAD note EXAM: CT HEAD WITHOUT CONTRAST TECHNIQUE: Contiguous axial images were obtained from the base of the skull through the vertex without intravenous contrast. RADIATION DOSE REDUCTION: This exam was performed according to the departmental dose-optimization program which includes automated exposure  control, adjustment of the mA and/or kV according to patient size and/or use of iterative reconstruction technique. COMPARISON:  CT head 09/09/2023 FINDINGS: Brain: Cerebral ventricle sizes are concordant with the degree of cerebral volume loss. Patchy and confluent areas of decreased attenuation are noted throughout the deep and periventricular white matter of the cerebral hemispheres bilaterally, compatible with chronic microvascular ischemic disease. No evidence of large-territorial acute infarction. No parenchymal hemorrhage. No mass lesion. No extra-axial collection. No mass effect or midline shift. No hydrocephalus. Basilar cisterns are patent. Vascular: No hyperdense vessel. Atherosclerotic calcifications are present within the cavernous internal carotid and vertebral arteries. Skull: No acute fracture or focal lesion. Sinuses/Orbits: Paranasal sinuses and mastoid air cells are clear. Bilateral lens replacement. Otherwise the orbits are unremarkable. Other: None. IMPRESSION: No acute intracranial abnormality. Electronically Signed   By: Morgane  Naveau M.D.   On: 10/19/2023 18:44   DG Chest 2 View Result Date: 10/19/2023 CLINICAL DATA:  Shortness of breath for 3 days. EXAM: CHEST - 2 VIEW COMPARISON:  September 08, 2023. FINDINGS: Stable cardiomediastinal silhouette. Hypoinflation of the lungs is noted. No significant lung opacities are noted. Bony thorax is unremarkable. IMPRESSION: Hypoinflation of the lungs.  No acute abnormality seen. Electronically Signed   By: Lynwood Landy Raddle M.D.   On: 10/19/2023 16:30      Labs: BNP (last 3 results) Recent Labs    09/08/23 2328 10/19/23 1434 10/21/23 0537  BNP 30.6 57.3 98.0   Basic Metabolic Panel: Recent Labs  Lab 10/21/23 0537 10/22/23 0524 10/23/23 0310 10/24/23 1510 10/25/23 0425  NA 135 135 136 136  --   K 3.5 4.1 3.9 4.1  --   CL 89* 92* 95* 95*  --   CO2 34* 30 30 29   --   GLUCOSE 106* 122* 104* 198*  --   BUN 27* 27* 27* 23  --    CREATININE 1.99* 1.70* 1.41* 0.96  --   CALCIUM 8.6* 8.8* 8.9 9.2  --   MG 1.9 1.8 1.8  --  1.7  PHOS 4.2 3.9 2.4*  --  2.5   Liver Function Tests: Recent Labs  Lab 10/21/23 0537 10/22/23 0524 10/23/23 0310  ALBUMIN 2.6* 2.6* 2.8*   No results for input(s): LIPASE, AMYLASE in the last 168 hours. No results for input(s): AMMONIA in the last 168 hours. CBC: Recent Labs  Lab 10/21/23 0537 10/22/23 0524 10/23/23 0310  WBC 6.2 5.1 7.7  HGB 9.6* 9.7* 9.8*  HCT 31.0* 30.6* 30.3*  MCV 91.2 89.7 87.3  PLT 271 258 281   Cardiac Enzymes: No results for input(s): CKTOTAL, CKMB, CKMBINDEX, TROPONINI in the last 168 hours. BNP: Invalid input(s): POCBNP CBG: Recent Labs  Lab 10/25/23 1744 10/25/23 2051 10/26/23 0731 10/26/23 1145 10/27/23 0832  GLUCAP 106* 106* 100* 115* 121*   D-Dimer No results for input(s): DDIMER in the last 72 hours. Hgb A1c No results for input(s): HGBA1C in the last 72 hours. Lipid Profile No results for input(s): CHOL, HDL, LDLCALC, TRIG, CHOLHDL, LDLDIRECT in the last 72 hours. Thyroid  function studies No results for input(s): TSH, T4TOTAL, T3FREE, THYROIDAB in the last 72 hours.  Invalid input(s): FREET3 Anemia work up No results for input(s): VITAMINB12, FOLATE, FERRITIN, TIBC, IRON , RETICCTPCT in the last 72 hours. Urinalysis    Component Value Date/Time   COLORURINE YELLOW (A) 10/19/2023 1928   APPEARANCEUR CLEAR (A) 10/19/2023 1928   LABSPEC 1.009 10/19/2023 1928   PHURINE 5.0 10/19/2023 1928   GLUCOSEU NEGATIVE 10/19/2023 1928   HGBUR NEGATIVE 10/19/2023 1928   BILIRUBINUR NEGATIVE 10/19/2023 1928   BILIRUBINUR negative 06/07/2023 1217   KETONESUR NEGATIVE 10/19/2023 1928   PROTEINUR NEGATIVE 10/19/2023 1928   UROBILINOGEN 0.2 06/07/2023 1217   NITRITE NEGATIVE 10/19/2023 1928   LEUKOCYTESUR SMALL (A) 10/19/2023 1928   Sepsis Labs Recent Labs  Lab 10/21/23 0537  10/22/23 0524 10/23/23 0310  WBC 6.2 5.1 7.7   Microbiology Recent Results (from the past 240 hours)  Culture, blood (routine x 2)     Status: None   Collection Time: 10/19/23  5:00 PM   Specimen: BLOOD  Result Value Ref Range Status   Specimen Description BLOOD BLOOD RIGHT ARM  Final   Special Requests   Final    BOTTLES DRAWN AEROBIC AND ANAEROBIC Blood Culture adequate volume   Culture   Final    NO GROWTH 5 DAYS Performed at [facility], [address] [place], [place] [address]    Report Status 10/24/2023 FINAL  Final  Culture, blood (routine x 2)     Status: None   Collection Time: 10/19/23  5:00 PM   Specimen: BLOOD  Result Value Ref Range Status   Specimen Description BLOOD BLOOD LEFT HAND  Final   Special Requests   Final    BOTTLES DRAWN AEROBIC AND ANAEROBIC Blood Culture adequate volume   Culture   Final    NO GROWTH 5 DAYS Performed at [facility], [address], [place], [place] [address]    Report Status 10/24/2023 FINAL  Final  Urine Culture     Status: Abnormal   Collection Time: 10/19/23  7:28 PM   Specimen: Urine, Catheterized  Result Value Ref Range Status   Specimen Description   Final    URINE, CATHETERIZED Performed at [facility], [address], [place], [place] [address]    Special Requests   Final    NONE Performed at [facility], [address] [place], [place] [address]    Culture 40,000 COLONIES/mL ESCHERICHIA COLI (A)  Final   Report Status 10/22/2023 FINAL  Final   Organism ID, Bacteria ESCHERICHIA COLI (A)  Final      Susceptibility   Escherichia coli - MIC*    AMPICILLIN >=32 RESISTANT Resistant     CEFAZOLIN  <=4 SENSITIVE Sensitive     CEFEPIME <=0.12 SENSITIVE Sensitive     CEFTRIAXONE  <=0.25 SENSITIVE Sensitive     CIPROFLOXACIN  <=0.25 SENSITIVE Sensitive     GENTAMICIN <=1 SENSITIVE Sensitive     IMIPENEM <=0.25 SENSITIVE Sensitive     NITROFURANTOIN <=16 SENSITIVE Sensitive      TRIMETH/SULFA >=320 RESISTANT Resistant     AMPICILLIN/SULBACTAM >=32 RESISTANT Resistant  PIP/TAZO <=4 SENSITIVE Sensitive ug/mL    * 40,000 COLONIES/mL ESCHERICHIA COLI  Resp panel by RT-PCR (RSV, Flu A&B, Covid) Anterior Nasal Swab     Status: None   Collection Time: 10/19/23  9:55 PM   Specimen: Anterior Nasal Swab  Result Value Ref Range Status   SARS Coronavirus 2 by RT PCR NEGATIVE NEGATIVE Final    Comment: (NOTE) SARS-CoV-2 target nucleic acids are NOT DETECTED.  The SARS-CoV-2 RNA is generally detectable in upper respiratory specimens during the acute phase of infection. The lowest concentration of SARS-CoV-2 viral copies this assay can detect is 138 copies/mL. A negative result does not preclude SARS-Cov-2 infection and should not be used as the sole basis for treatment or other patient management decisions. A negative result may occur with  improper specimen collection/handling, submission of specimen other than nasopharyngeal swab, presence of viral mutation(s) within the areas targeted by this assay, and inadequate number of viral copies(<138 copies/mL). A negative result must be combined with clinical observations, patient history, and epidemiological information. The expected result is Negative.  Fact Sheet for Patients:  BloggerCourse.com  Fact Sheet for Healthcare Providers:  SeriousBroker.it  This test is no t yet approved or cleared by the [place]  FDA and  has been authorized for detection and/or diagnosis of SARS-CoV-2 by FDA under an Emergency Use Authorization (EUA). This EUA will remain  in effect (meaning this test can be used) for the duration of the COVID-19 declaration under Section 564(b)(1) of the Act, 21 U.S.C.section 360bbb-3(b)(1), unless the authorization is terminated  or revoked sooner.       Influenza A by PCR NEGATIVE NEGATIVE Final   Influenza B by PCR NEGATIVE NEGATIVE Final     Comment: (NOTE) The Xpert Xpress SARS-CoV-2/FLU/RSV plus assay is intended as an aid in the diagnosis of influenza from Nasopharyngeal swab specimens and should not be used as a sole basis for treatment. Nasal washings and aspirates are unacceptable for Xpert Xpress SARS-CoV-2/FLU/RSV testing.  Fact Sheet for Patients: BloggerCourse.com  Fact Sheet for Healthcare Providers: SeriousBroker.it  This test is not yet approved or cleared by the [place]  FDA and has been authorized for detection and/or diagnosis of SARS-CoV-2 by FDA under an Emergency Use Authorization (EUA). This EUA will remain in effect (meaning this test can be used) for the duration of the COVID-19 declaration under Section 564(b)(1) of the Act, 21 U.S.C. section 360bbb-3(b)(1), unless the authorization is terminated or revoked.     Resp Syncytial Virus by PCR NEGATIVE NEGATIVE Final    Comment: (NOTE) Fact Sheet for Patients: BloggerCourse.com  Fact Sheet for Healthcare Providers: SeriousBroker.it  This test is not yet approved or cleared by the [place]  FDA and has been authorized for detection and/or diagnosis of SARS-CoV-2 by FDA under an Emergency Use Authorization (EUA). This EUA will remain in effect (meaning this test can be used) for the duration of the COVID-19 declaration under Section 564(b)(1) of the Act, 21 U.S.C. section 360bbb-3(b)(1), unless the authorization is terminated or revoked.  Performed at [facility], [address] [place], [place] [address]   MRSA Next Gen by PCR, Nasal     Status: None   Collection Time: 10/20/23 12:32 AM   Specimen: Nasal Mucosa; Nasal Swab  Result Value Ref Range Status   MRSA by PCR Next Gen NOT DETECTED NOT DETECTED Final    Comment: (NOTE) The GeneXpert MRSA Assay (FDA approved for NASAL specimens only), is one component of a  comprehensive MRSA  colonization surveillance program. It is not intended to diagnose MRSA infection nor to guide or monitor treatment for MRSA infections. Test performance is not FDA approved in patients less than 59 years old. Performed at [facility], [address] [place], [place] [address]      Total time spend on discharging this patient, including the last patient exam, discussing the hospital stay, instructions for ongoing care as it relates to all pertinent caregivers, as well as preparing the medical discharge records, prescriptions, and/or referrals as applicable, is 30 minutes.    Ellouise Haber, MD  [facility] 10/27/2023, 8:41 AM

## 2023-10-27 NOTE — TOC Transition Note (Signed)
 Transition of Care [facility]) - Discharge Note   Patient Details  Name: Stacy Kirk MRN: [id number] Date of Birth: 1946-01-29  Transition [facility]) CM/SW Contact:  Corean ONEIDA Haddock, RN Phone Number: 10/27/2023, 9:33 AM   Clinical Narrative:     Noted patient did not DC yesterday [facility] transport arranged for today [place]  with [facility] notified Husband aware   Final next level of care: Home w Home Health Services Barriers to Discharge: No Barriers Identified   Patient Goals and CMS Choice Patient states their goals for this hospitalization and ongoing recovery are:: Going home          Discharge Placement                       Discharge Plan and Services Additional resources added to the After Visit Summary for                              [facility] Agency: [facility] (Patient already established) Date HH Agency Contacted: 10/26/23   Representative spoke with at [facility] Agency: [place]   Social Drivers of Health (SDOH) Interventions SDOH Screenings   Food Insecurity: No Food Insecurity (10/20/2023)  Recent Concern: Food Insecurity - Food Insecurity Present (09/27/2023)   Received from [facility]  Housing: Low Risk  (10/20/2023)  Transportation Needs: No Transportation Needs (10/20/2023)  Utilities: Not At Risk (10/20/2023)  Financial Resource Strain: Medium Risk (09/27/2023)   Received from [facility]  Social Connections: Unknown (10/20/2023)  Tobacco Use: Medium Risk (10/19/2023)     Readmission Risk Interventions     No data to display

## 2023-10-27 NOTE — Telephone Encounter (Signed)
 Copied from CRM [id number]. Topic: Clinical - Medical Advice >> Oct 27, 2023  9:05 AM Thersia BROCKS wrote: Reason for RMF:Unwbj from [facility] called in stating that [facility] will be following patient palliative services if you have any question can reach   [phone]

## 2023-10-27 NOTE — Plan of Care (Signed)
  Problem: Coping: Goal: Level of anxiety will decrease Outcome: Progressing   Problem: Elimination: Goal: Will not experience complications related to bowel motility Outcome: Progressing   Problem: Fluid Volume: Goal: Ability to maintain a balanced intake and output will improve Outcome: Not Progressing   Problem: Health Behavior/Discharge Planning: Goal: Ability to manage health-related needs will improve Outcome: Not Progressing   Problem: Elimination: Goal: Will not experience complications related to urinary retention Outcome: Not Progressing

## 2023-10-28 DIAGNOSIS — E039 Hypothyroidism, unspecified: Secondary | ICD-10-CM | POA: Diagnosis not present

## 2023-10-28 DIAGNOSIS — I130 Hypertensive heart and chronic kidney disease with heart failure and stage 1 through stage 4 chronic kidney disease, or unspecified chronic kidney disease: Secondary | ICD-10-CM | POA: Diagnosis not present

## 2023-10-28 DIAGNOSIS — E1122 Type 2 diabetes mellitus with diabetic chronic kidney disease: Secondary | ICD-10-CM | POA: Diagnosis not present

## 2023-10-28 DIAGNOSIS — S72142D Displaced intertrochanteric fracture of left femur, subsequent encounter for closed fracture with routine healing: Secondary | ICD-10-CM | POA: Diagnosis not present

## 2023-10-28 DIAGNOSIS — I890 Lymphedema, not elsewhere classified: Secondary | ICD-10-CM | POA: Diagnosis not present

## 2023-10-28 DIAGNOSIS — N1831 Chronic kidney disease, stage 3a: Secondary | ICD-10-CM | POA: Diagnosis not present

## 2023-10-28 DIAGNOSIS — I5032 Chronic diastolic (congestive) heart failure: Secondary | ICD-10-CM | POA: Diagnosis not present

## 2023-10-28 DIAGNOSIS — K219 Gastro-esophageal reflux disease without esophagitis: Secondary | ICD-10-CM | POA: Diagnosis not present

## 2023-10-28 DIAGNOSIS — L89626 Pressure-induced deep tissue damage of left heel: Secondary | ICD-10-CM | POA: Diagnosis not present

## 2023-10-29 ENCOUNTER — Other Ambulatory Visit: Payer: Self-pay | Admitting: Nurse Practitioner

## 2023-10-30 ENCOUNTER — Telehealth: Payer: Self-pay

## 2023-10-30 ENCOUNTER — Telehealth: Payer: Self-pay | Admitting: Nurse Practitioner

## 2023-10-30 DIAGNOSIS — E1122 Type 2 diabetes mellitus with diabetic chronic kidney disease: Secondary | ICD-10-CM | POA: Diagnosis not present

## 2023-10-30 DIAGNOSIS — K219 Gastro-esophageal reflux disease without esophagitis: Secondary | ICD-10-CM | POA: Diagnosis not present

## 2023-10-30 DIAGNOSIS — I130 Hypertensive heart and chronic kidney disease with heart failure and stage 1 through stage 4 chronic kidney disease, or unspecified chronic kidney disease: Secondary | ICD-10-CM | POA: Diagnosis not present

## 2023-10-30 DIAGNOSIS — I5032 Chronic diastolic (congestive) heart failure: Secondary | ICD-10-CM | POA: Diagnosis not present

## 2023-10-30 DIAGNOSIS — E039 Hypothyroidism, unspecified: Secondary | ICD-10-CM | POA: Diagnosis not present

## 2023-10-30 DIAGNOSIS — S72142D Displaced intertrochanteric fracture of left femur, subsequent encounter for closed fracture with routine healing: Secondary | ICD-10-CM | POA: Diagnosis not present

## 2023-10-30 DIAGNOSIS — L89626 Pressure-induced deep tissue damage of left heel: Secondary | ICD-10-CM | POA: Diagnosis not present

## 2023-10-30 DIAGNOSIS — I890 Lymphedema, not elsewhere classified: Secondary | ICD-10-CM | POA: Diagnosis not present

## 2023-10-30 DIAGNOSIS — N1831 Chronic kidney disease, stage 3a: Secondary | ICD-10-CM | POA: Diagnosis not present

## 2023-10-30 NOTE — Transitions of Care (Post Inpatient/ED Visit) (Signed)
   10/30/2023  Name: Stacy Kirk MRN: [id number] DOB: 09-19-1945  Today's TOC FU Call Status: Today's TOC FU Call Status:: Unsuccessful Call (1st Attempt) Unsuccessful Call (1st Attempt) Date: 10/30/23  Attempted to reach the patient regarding the most recent Inpatient/ED visit.  Follow Up Plan: Additional outreach attempts will be made to reach the patient to complete the Transitions of Care (Post Inpatient/ED visit) call.   Arvin Seip RN, BSN, CCM [employer], Population Health Case Manager Phone: [phone]

## 2023-10-30 NOTE — Telephone Encounter (Signed)
 Any open appointment slot they are welcome to have

## 2023-10-30 NOTE — Telephone Encounter (Signed)
 Patient not available for virtual visits, due to home health coming in and transportation scheduling asking for a later appt please advise.   Copied from CRM [id number]. Topic: Appointments - Scheduling Inquiry for Clinic >> Oct 30, 2023 12:51 PM Henretta I wrote: Reason for CRM: Patient has appointment for 08/07 and was wanting to know if it can be rescheduled for later in the afternoon or is there is a different day that's soon than September that they can be squeezing into for afternoon appointment.

## 2023-10-31 ENCOUNTER — Telehealth: Payer: Self-pay

## 2023-10-31 DIAGNOSIS — J441 Chronic obstructive pulmonary disease with (acute) exacerbation: Secondary | ICD-10-CM

## 2023-10-31 DIAGNOSIS — Z139 Encounter for screening, unspecified: Secondary | ICD-10-CM

## 2023-10-31 NOTE — Telephone Encounter (Signed)
Verbal orders approved.

## 2023-10-31 NOTE — Telephone Encounter (Signed)
 Copied from CRM [id number]. Topic: Clinical - Home Health Verbal Orders >> Oct 30, 2023  3:40 PM [place] wrote: Caller/Agency: L[facility] Callback Number: [phone] Service Requested: Physical Therapy Frequency: 2 x a week for 3 weeks: 1 x a week for 4 weeks Any new concerns about the patient? Yes- Oxygen is at 93 before therapy and during it is 89-95

## 2023-10-31 NOTE — Telephone Encounter (Signed)
 Copied from CRM #[id number]. Topic: Clinical - Home Health Verbal Orders >> Oct 31, 2023  3:14 PM Precious C wrote: Caller/Agency: Courtney/ [facility] Callback Number: [phone] Service Requested: Skilled Nursing Frequency: Once a week for 8 weeks Any new concerns about the patient? Yes, there's wound on left heel

## 2023-10-31 NOTE — Telephone Encounter (Signed)
 Called and spoke with lorie to approve verbal orders.

## 2023-10-31 NOTE — Transitions of Care (Post Inpatient/ED Visit) (Unsigned)
 11/01/2023  Name: PREEYA CLECKLEY MRN: [id number] DOB: May 12, 1945  Today's TOC FU Call Status: Today's TOC FU Call Status:: Successful TOC FU Call Completed TOC FU Call Complete Date: 10/31/23 Patient's Name and Date of Birth confirmed.  Transition Care Management Follow-up Telephone Call Date of Discharge: 10/27/23 Discharge Facility: [facility]) Type of Discharge: Inpatient Admission Primary Inpatient Discharge Diagnosis:: Acute respiratory failure with hypoxia and hypercapnia How have you been since you were released from the hospital?: Same Any questions or concerns?: Yes Patient Questions/Concerns:: spouse states patient will not comply with being non weight bearing on her left foot.  He states patient wants to walk to the bathroom instead of using her bedside commode which will help her keep the weight off her foot. Spouse states patient does not want to wear the prescribed boot to left foot because she doesn't like the smell. Patient Questions/Concerns Addressed: Other: (spouse/ caregiver concerns discussed.)  Items Reviewed: Did you receive and understand the discharge instructions provided?: Yes Medications obtained,verified, and reconciled?: Yes (Medications Reviewed) Any new allergies since your discharge?: No Dietary orders reviewed?: Yes Type of Diet Ordered:: low salt heart healthy Do you have support at home?: Yes People in Home [RPT]: spouse Name of Support/Comfort Primary Source: Kayla Pearce  Medications Reviewed Today: Medications Reviewed Today     Reviewed by Lottie Siska E, RN (Registered Nurse) on 10/31/23 at 1154  Med List Status: <None>   Medication Order Taking? Sig Documenting Provider Last Dose Status Informant  acetaminophen  (TYLENOL ) 500 MG tablet [id number] Yes Take 1,000 mg by mouth every 6 (six) hours as needed (FOR PAIN.). [provider]  Active Spouse/Significant Other, Pharmacy Records  cetirizine   (ZYRTEC ) 10 MG tablet [id number] Yes Take 1 tablet (10 mg total) by mouth daily. Wendee Lynwood HERO, NP  Active Spouse/Significant Other, Pharmacy Records  collagenase  (SANTYL ) 250 UNIT/GM ointment [id number] Yes Apply topically daily. Awanda City, MD  Active   ELIQUIS  5 MG TABS tablet [id number] Yes TAKE 1 TABLET BY MOUTH TWICE A DAY Wendee Lynwood HERO, NP  Active Spouse/Significant Other, Pharmacy Records  ferrous sulfate  325 (65 FE) MG tablet [id number] Yes TAKE 325 MG BY MOUTH DAILY. Wendee Lynwood HERO, NP  Active Spouse/Significant Other, Pharmacy Records  lactose free nutrition (BOOST PLUS) LIQD [id number] Yes Take 237 mLs by mouth 2 (two) times daily between meals. Awanda City, MD  Active Spouse/Significant Other  levothyroxine  (SYNTHROID ) 100 MCG tablet [id number] Yes TAKE 1 TABLET BY MOUTH EVERY DAY Wendee Lynwood HERO, NP  Active   lidocaine  (LIDODERM ) 5 % [id number]  Place 1-2 patches onto the skin at bedtime. Remove & Discard patch within 12 hours or as directed by MD  Patient not taking: Reported on 10/31/2023   Awanda City, MD  Active Spouse/Significant Other  methocarbamol  (ROBAXIN ) 500 MG tablet [id number]  Take 1 tablet (500 mg total) by mouth every 8 (eight) hours as needed for muscle spasms.  Patient not taking: Reported on 10/31/2023   Awanda City, MD  Active Spouse/Significant Other  metoprolol  succinate (TOPROL -XL) 25 MG 24 hr tablet [id number] Yes Take 1 tablet (25 mg total) by mouth daily. Wendee Lynwood HERO, NP  Active Spouse/Significant Other, Pharmacy Records  multivitamin (RENA-VIT) TABS tablet [id number] Yes Take 1 tablet by mouth at bedtime. Awanda City, MD  Active Spouse/Significant Other  pantoprazole  (PROTONIX ) 40 MG tablet [id number] Yes TAKE 1 TABLET BY MOUTH EVERY DAY Wendee Lynwood HERO, NP  Active   polyethylene glycol (MIRALAX  / GLYCOLAX ) 17  g packet [id number]  Take 17 g by mouth 2 (two) times daily. With a glass of fluid.  Patient not taking: Reported on 10/31/2023   Awanda City, MD  Active   senna-docusate  (SENOKOT-S) 8.6-50 MG tablet [id number] Yes Take 2 tablets by mouth 2 (two) times daily as needed for mild constipation. Awanda City, MD  Active   spironolactone  (ALDACTONE ) 25 MG tablet [id number] Yes Take 1 tablet (25 mg total) by mouth daily. Home med. Awanda City, MD  Active   torsemide  (DEMADEX ) 20 MG tablet [id number] Yes Take 2 tablets (40 mg total) by mouth daily. Home med. Awanda City, MD  Active   Med List Note Rayne Arley HERO, CPhT 11/22/22 1427): [place],[name]  Spouse   [phone]            Home Care and Equipment/Supplies: Were Home Health Services Ordered?: Yes Name of Home Health Agency:: [facility] Has Agency set up a time to come to your home?: Yes First Home Health Visit Date: 10/29/23 Any new equipment or medical supplies ordered?: Yes (spouse states patient has a wheelchair, BSC, and  hospital bed) Name of Medical supply agency?: spouse states he is unsure Were you able to get the equipment/medical supplies?: Yes Do you have any questions related to the use of the equipment/supplies?: No  Functional Questionnaire: Do you need assistance with bathing/showering or dressing?: Yes Do you need assistance with meal preparation?: Yes Do you need assistance with eating?: No Do you have difficulty maintaining continence: Yes Do you need assistance with getting out of bed/getting out of a chair/moving?: Yes Do you have difficulty managing or taking your medications?: Yes  Follow up appointments reviewed: PCP Follow-up appointment confirmed?: Yes Date of PCP follow-up appointment?: 11/02/23 Follow-up Provider: Lynwood Crandall, NP Specialist Hospital Follow-up appointment confirmed?: Yes Date of Specialist follow-up appointment?: 11/15/23 Follow-Up Specialty Provider:: Dr.Vaillancourt Do you need transportation to your follow-up appointment?: Yes (spouse states he is having to use [facility] to get patient to her appointments. He states he is willing to talk  with someone regarding any additional transportation options.) Do you understand care options if your condition(s) worsen?: Yes-patient verbalized understanding  SDOH Interventions Today    Flowsheet Row Most Recent Value  SDOH Interventions   Food Insecurity Interventions Intervention Not Indicated  Housing Interventions Intervention Not Indicated  Transportation Interventions Other (Comment)  [referred to [employer] guide for transportation resources.  Spouse states patient has become more debilitated and is now requiring full assistance to get to provider appointments.]  Utilities Interventions Intervention Not Indicated    Goals Addressed             This Visit's Progress    VBCI Transitions of Care (TOC) Care Plan       Problems:  Recent Hospitalization for treatment of acute respiratory distress/ COPD exacerbation/ acute bronchitis Knowledge Deficit Related to management of COPD/ left heel ulcer, SDOH barrier: transportation, and memory deficits per spouse   Goal:  Over the next 30 days, the patient will not experience hospital readmission  Interventions:  Transitions of Care: Doctor Visits  - discussed the importance of doctor visits Reviewed Signs and symptoms of infection Referred to community resource guide for transportation and in home care assistance Discussed signs/ symptoms of infection Assessed for infection symptoms to foot wound Assessed for care support needs Medications reviewed and compliance discussed Reviewed upcoming provider visits Confirmed spouse knowledgeable regarding dressing change to foot and has supplies Discussed and offered 30  day TOC program Active listening and support Advised ongoing reinforcement with patient need to wear boot to protect foot wound and non weight bearing status Advised spouse to discuss memory concerns with patients primary care provider.   Patient Self Care Activities:  Attend all scheduled provider  appointments Call pharmacy for medication refills 3-7 days in advance of running out of medications Call provider office for new concerns or questions  Take medications as prescribed   Monitor foot wound for signs of infection and report noticed infection symptoms to provider    Plan:  The patient has been provided with contact information for the care management team and has been advised to call with any health related questions or concerns.   Neiko Trivedi RN, BSN, CCM [place]  [facility], Population Health Case Manager Phone: [phone]         Discussed and offered 30 day [facility] program.  Patient's spouse verbally agreed.  The patient has been provided with contact information for the care management team and has been advised to call with any health -related questions or concerns.  The patient verbalized understanding with current plan of care.  The patient is directed to their insurance card regarding availability of benefits coverage.      Arvin Seip RN, BSN, CCM [employer], Population Health Case Manager Phone: [phone]

## 2023-11-01 ENCOUNTER — Telehealth: Payer: Self-pay

## 2023-11-01 DIAGNOSIS — K219 Gastro-esophageal reflux disease without esophagitis: Secondary | ICD-10-CM | POA: Diagnosis not present

## 2023-11-01 DIAGNOSIS — E039 Hypothyroidism, unspecified: Secondary | ICD-10-CM | POA: Diagnosis not present

## 2023-11-01 DIAGNOSIS — S72142D Displaced intertrochanteric fracture of left femur, subsequent encounter for closed fracture with routine healing: Secondary | ICD-10-CM | POA: Diagnosis not present

## 2023-11-01 DIAGNOSIS — I890 Lymphedema, not elsewhere classified: Secondary | ICD-10-CM | POA: Diagnosis not present

## 2023-11-01 DIAGNOSIS — I130 Hypertensive heart and chronic kidney disease with heart failure and stage 1 through stage 4 chronic kidney disease, or unspecified chronic kidney disease: Secondary | ICD-10-CM | POA: Diagnosis not present

## 2023-11-01 DIAGNOSIS — E1122 Type 2 diabetes mellitus with diabetic chronic kidney disease: Secondary | ICD-10-CM | POA: Diagnosis not present

## 2023-11-01 DIAGNOSIS — I5032 Chronic diastolic (congestive) heart failure: Secondary | ICD-10-CM | POA: Diagnosis not present

## 2023-11-01 DIAGNOSIS — N1831 Chronic kidney disease, stage 3a: Secondary | ICD-10-CM | POA: Diagnosis not present

## 2023-11-01 DIAGNOSIS — L89626 Pressure-induced deep tissue damage of left heel: Secondary | ICD-10-CM | POA: Diagnosis not present

## 2023-11-01 NOTE — Telephone Encounter (Signed)
Verbal orders approved.

## 2023-11-01 NOTE — Telephone Encounter (Signed)
 Copied from CRM [id number]. Topic: General - Other >> Nov 01, 2023  1:02 PM Stacy Kirk wrote: Reason for CRM: pt wasn't to send a message to [facility], he wants her to give him a call back.  Call back # [phone]

## 2023-11-01 NOTE — Telephone Encounter (Signed)
 Called courtney from [facility] to approve verbal orders per pcp. Courtney verbalized understanding and has no questions or concerns.

## 2023-11-01 NOTE — Patient Instructions (Signed)
 Visit Information  Thank you for taking time to visit with me today. Please don't hesitate to contact me if I can be of assistance to you before our next scheduled telephone appointment.  Our next appointment is by telephone on 11/07/23 at 2 pm  Following is a copy of your care plan:   Goals Addressed             This Visit's Progress    VBCI Transitions of Care (TOC) Care Plan       Problems:  Recent Hospitalization for treatment of acute respiratory distress/ COPD exacerbation/ acute bronchitis Knowledge Deficit Related to management of COPD/ left heel ulcer, SDOH barrier: transportation, and memory deficits per spouse   Goal:  Over the next 30 days, the patient will not experience hospital readmission  Interventions:  Transitions of Care: Doctor Visits  - discussed the importance of doctor visits Reviewed Signs and symptoms of infection Referred to community resource guide for transportation and in home care assistance Discussed signs/ symptoms of infection Assessed for infection symptoms to foot wound Assessed for care support needs Medications reviewed and compliance discussed Reviewed upcoming provider visits Confirmed spouse knowledgeable regarding dressing change to foot and has supplies Discussed and offered 30 day TOC program Active listening and support Advised ongoing reinforcement with patient need to wear boot to protect foot wound and non weight bearing status Advised spouse to discuss memory concerns with patients primary care provider.   Patient Self Care Activities:  Attend all scheduled provider appointments Call pharmacy for medication refills 3-7 days in advance of running out of medications Call provider office for new concerns or questions  Take medications as prescribed   Monitor foot wound for signs of infection and report noticed infection symptoms to provider    Plan:  The patient has been provided with contact information for the care  management team and has been advised to call with any health related questions or concerns.   Breeann Reposa RN, BSN, CCM [place]  [facility], Population Health Case Manager Phone: [phone]         The patient verbalized understanding of instructions, educational materials, and care plan provided today and DECLINED offer to receive copy of patient instructions, educational materials, and care plan.   The patient has been provided with contact information for the care management team and has been advised to call with any health related questions or concerns.   Please call the care guide team at [phone] if you need to cancel or reschedule your appointment.   Please call the Suicide and Crisis Lifeline: 988 call 1-800-273-TALK (toll free, 24 hour hotline) if you are experiencing a Mental Health or Behavioral Health Crisis or need someone to talk to.  Arvin Seip RN, BSN, CCM [employer], Population Health Case Manager Phone: [phone]

## 2023-11-01 NOTE — Addendum Note (Signed)
 Addended by: LANDY HESS E on: 11/01/2023 11:31 PM   Modules accepted: Orders

## 2023-11-02 ENCOUNTER — Ambulatory Visit: Admitting: Nurse Practitioner

## 2023-11-02 ENCOUNTER — Inpatient Hospital Stay: Admitting: Nurse Practitioner

## 2023-11-02 NOTE — Progress Notes (Deleted)
   Established Patient Office Visit  Subjective   Patient ID: Stacy Kirk, female    DOB: Nov 11, 1945  Age: 78 y.o. MRN: [id number]  No chief complaint on file.   HPI   Hospital follow-up: Patient has had several hospital admissions since last office it.  First beginning on 09/08/2023 with discharge on 09/14/2023.  Patient was discharged to a skilled nursing rehab facility.  He came into the emergency department status post fall that resulted in a closed left hip fracture with left hip intramedullary nail on 09/09/2023.  Patient was put on Eliquis  at that juncture for DVT prophylax with weightbearing as tolerated and follow-up with [facility] clinic Ortho 2 weeks post discharge.  At the hospitalization they did hold torsemide  and Aldactone  due to AKI and low normal blood pressure.  They did continue patient's metoprolol .  Patient was taken back to the hospital on 10/19/2023 for altered mental status and shortness of breath patient has had a history of PE.  Patient was admitted and discharged on 10/27/2023.  She was could be discharged to SNF but they declined and patient went home with CODE STATUS of DNR.  Patient did have a left heel ulcer and wanted to be cleansed with water apply Santyl  to the wound followed by Mepitel foam border dressing daily.  Placed the left foot in a Prevalon boot to offload pressure.  At this hospitalization discharge they resumed Aldactone  and torsemide .  Patient also had some constipation which was resistant to lactulose .  Patient refused further bowel regimen and was instructed to try MiraLAX .  Patient was also consulted to palliative care due to functional decline.  Patient's A1c had trended down a discontinue metformin  altogether her last A1c was 5.1% {History (Optional):23778}  ROS    Objective:     There were no vitals taken for this visit. {Vitals History (Optional):23777}  Physical Exam   No results found for any visits on 11/02/23.  {Labs  (Optional):23779}  The ASCVD Risk score (Arnett DK, et al., 2019) failed to calculate for the following reasons:   The 2019 ASCVD risk score is only valid for ages 39 to 70    Assessment & Plan:   Problem List Items Addressed This Visit   None   No follow-ups on file.    Adina Crandall, NP

## 2023-11-06 ENCOUNTER — Telehealth: Payer: Self-pay

## 2023-11-06 NOTE — Telephone Encounter (Signed)
 Copied from CRM [id number]. Topic: Clinical - Home Health Verbal Orders >> Nov 06, 2023  3:42 PM Jayma L wrote: Caller/Agency: [facility] spoke to Arvin Rushing Number: [phone] Service Requested: once a week to see patient and clean wound, apply calcium with alginate with silver and cover with boarded guaze and skin prep around the wound to pressure on left heel   Frequency: once a week  Any new concerns about the patient? No , just came out of hospital

## 2023-11-06 NOTE — Progress Notes (Signed)
 Complex Care Management Note  Care Guide Note 11/06/2023 Name: KALIJAH WESTFALL MRN: [id number] DOB: 05-20-1945  Avelina JONETTA Hopman is a 78 y.o. year old female who sees Cable, Lynwood HERO, NP for primary care. I reached out to Avelina JONETTA Scalera by phone today to offer complex care management services.  Ms. Bultema was given information about Complex Care Management services today including:   The Complex Care Management services include support from the care team which includes your Nurse Care Manager, Clinical Social Worker, or Pharmacist.  The Complex Care Management team is here to help remove barriers to the health concerns and goals most important to you. Complex Care Management services are voluntary, and the patient may decline or stop services at any time by request to their care team member.   Complex Care Management Consent Status: Patient agreed to services and verbal consent obtained.   Follow up plan:  Telephone appointment with complex care management team member scheduled for:  11/13/23 at 3:00 p.m.   Encounter Outcome:  Patient Scheduled  Dreama [facility]wood [facility]  [facility], [facility] Care Management Assistant Direct Dial: [phone]  Fax: [phone]

## 2023-11-07 ENCOUNTER — Other Ambulatory Visit: Payer: Self-pay

## 2023-11-07 ENCOUNTER — Telehealth

## 2023-11-07 ENCOUNTER — Encounter: Payer: Self-pay | Admitting: Emergency Medicine

## 2023-11-07 ENCOUNTER — Inpatient Hospital Stay
Admission: EM | Admit: 2023-11-07 | Discharge: 2023-11-23 | DRG: 698 | Disposition: A | Discharge status: Skilled Nursing Facility | Attending: Internal Medicine | Admitting: Internal Medicine

## 2023-11-07 DIAGNOSIS — Y846 Urinary catheterization as the cause of abnormal reaction of the patient, or of later complication, without mention of misadventure at the time of the procedure: Secondary | ICD-10-CM | POA: Diagnosis present

## 2023-11-07 DIAGNOSIS — Z813 Family history of other psychoactive substance abuse and dependence: Secondary | ICD-10-CM

## 2023-11-07 DIAGNOSIS — Z85828 Personal history of other malignant neoplasm of skin: Secondary | ICD-10-CM

## 2023-11-07 DIAGNOSIS — Z885 Allergy status to narcotic agent status: Secondary | ICD-10-CM

## 2023-11-07 DIAGNOSIS — Z90710 Acquired absence of both cervix and uterus: Secondary | ICD-10-CM

## 2023-11-07 DIAGNOSIS — L97429 Non-pressure chronic ulcer of left heel and midfoot with unspecified severity: Secondary | ICD-10-CM | POA: Diagnosis present

## 2023-11-07 DIAGNOSIS — I5032 Chronic diastolic (congestive) heart failure: Secondary | ICD-10-CM | POA: Diagnosis not present

## 2023-11-07 DIAGNOSIS — Z833 Family history of diabetes mellitus: Secondary | ICD-10-CM | POA: Diagnosis not present

## 2023-11-07 DIAGNOSIS — R1084 Generalized abdominal pain: Principal | ICD-10-CM

## 2023-11-07 DIAGNOSIS — S72002A Fracture of unspecified part of neck of left femur, initial encounter for closed fracture: Secondary | ICD-10-CM

## 2023-11-07 DIAGNOSIS — E876 Hypokalemia: Secondary | ICD-10-CM | POA: Diagnosis present

## 2023-11-07 DIAGNOSIS — G9341 Metabolic encephalopathy: Secondary | ICD-10-CM | POA: Diagnosis present

## 2023-11-07 DIAGNOSIS — Z9049 Acquired absence of other specified parts of digestive tract: Secondary | ICD-10-CM | POA: Diagnosis not present

## 2023-11-07 DIAGNOSIS — T83511A Infection and inflammatory reaction due to indwelling urethral catheter, initial encounter: Principal | ICD-10-CM | POA: Diagnosis present

## 2023-11-07 DIAGNOSIS — E871 Hypo-osmolality and hyponatremia: Secondary | ICD-10-CM | POA: Diagnosis present

## 2023-11-07 DIAGNOSIS — K219 Gastro-esophageal reflux disease without esophagitis: Secondary | ICD-10-CM | POA: Diagnosis not present

## 2023-11-07 DIAGNOSIS — Z888 Allergy status to other drugs, medicaments and biological substances status: Secondary | ICD-10-CM

## 2023-11-07 DIAGNOSIS — R2681 Unsteadiness on feet: Secondary | ICD-10-CM | POA: Diagnosis not present

## 2023-11-07 DIAGNOSIS — B965 Pseudomonas (aeruginosa) (mallei) (pseudomallei) as the cause of diseases classified elsewhere: Secondary | ICD-10-CM | POA: Diagnosis present

## 2023-11-07 DIAGNOSIS — Z79890 Hormone replacement therapy (postmenopausal): Secondary | ICD-10-CM

## 2023-11-07 DIAGNOSIS — B852 Pediculosis, unspecified: Secondary | ICD-10-CM | POA: Diagnosis present

## 2023-11-07 DIAGNOSIS — Z881 Allergy status to other antibiotic agents status: Secondary | ICD-10-CM

## 2023-11-07 DIAGNOSIS — H109 Unspecified conjunctivitis: Secondary | ICD-10-CM | POA: Diagnosis present

## 2023-11-07 DIAGNOSIS — L299 Pruritus, unspecified: Secondary | ICD-10-CM | POA: Diagnosis not present

## 2023-11-07 DIAGNOSIS — Z7901 Long term (current) use of anticoagulants: Secondary | ICD-10-CM | POA: Diagnosis not present

## 2023-11-07 DIAGNOSIS — R4182 Altered mental status, unspecified: Secondary | ICD-10-CM | POA: Diagnosis present

## 2023-11-07 DIAGNOSIS — F0390 Unspecified dementia without behavioral disturbance: Secondary | ICD-10-CM | POA: Diagnosis present

## 2023-11-07 DIAGNOSIS — I130 Hypertensive heart and chronic kidney disease with heart failure and stage 1 through stage 4 chronic kidney disease, or unspecified chronic kidney disease: Secondary | ICD-10-CM | POA: Diagnosis not present

## 2023-11-07 DIAGNOSIS — Z515 Encounter for palliative care: Secondary | ICD-10-CM | POA: Diagnosis not present

## 2023-11-07 DIAGNOSIS — E1122 Type 2 diabetes mellitus with diabetic chronic kidney disease: Secondary | ICD-10-CM | POA: Diagnosis present

## 2023-11-07 DIAGNOSIS — N179 Acute kidney failure, unspecified: Secondary | ICD-10-CM | POA: Diagnosis present

## 2023-11-07 DIAGNOSIS — A419 Sepsis, unspecified organism: Secondary | ICD-10-CM | POA: Diagnosis present

## 2023-11-07 DIAGNOSIS — T83518A Infection and inflammatory reaction due to other urinary catheter, initial encounter: Secondary | ICD-10-CM | POA: Diagnosis present

## 2023-11-07 DIAGNOSIS — I959 Hypotension, unspecified: Secondary | ICD-10-CM | POA: Diagnosis present

## 2023-11-07 DIAGNOSIS — B952 Enterococcus as the cause of diseases classified elsewhere: Secondary | ICD-10-CM | POA: Diagnosis present

## 2023-11-07 DIAGNOSIS — L89626 Pressure-induced deep tissue damage of left heel: Secondary | ICD-10-CM | POA: Diagnosis not present

## 2023-11-07 DIAGNOSIS — J9601 Acute respiratory failure with hypoxia: Secondary | ICD-10-CM | POA: Diagnosis not present

## 2023-11-07 DIAGNOSIS — J449 Chronic obstructive pulmonary disease, unspecified: Secondary | ICD-10-CM | POA: Diagnosis present

## 2023-11-07 DIAGNOSIS — Z883 Allergy status to other anti-infective agents status: Secondary | ICD-10-CM

## 2023-11-07 DIAGNOSIS — Z66 Do not resuscitate: Secondary | ICD-10-CM | POA: Diagnosis present

## 2023-11-07 DIAGNOSIS — N390 Urinary tract infection, site not specified: Secondary | ICD-10-CM | POA: Diagnosis not present

## 2023-11-07 DIAGNOSIS — S72142D Displaced intertrochanteric fracture of left femur, subsequent encounter for closed fracture with routine healing: Secondary | ICD-10-CM | POA: Diagnosis not present

## 2023-11-07 DIAGNOSIS — E11621 Type 2 diabetes mellitus with foot ulcer: Secondary | ICD-10-CM | POA: Diagnosis present

## 2023-11-07 DIAGNOSIS — N1831 Chronic kidney disease, stage 3a: Secondary | ICD-10-CM | POA: Diagnosis present

## 2023-11-07 DIAGNOSIS — Z8543 Personal history of malignant neoplasm of ovary: Secondary | ICD-10-CM

## 2023-11-07 DIAGNOSIS — K5641 Fecal impaction: Secondary | ICD-10-CM | POA: Diagnosis present

## 2023-11-07 DIAGNOSIS — Z886 Allergy status to analgesic agent status: Secondary | ICD-10-CM

## 2023-11-07 DIAGNOSIS — M1712 Unilateral primary osteoarthritis, left knee: Secondary | ICD-10-CM | POA: Diagnosis not present

## 2023-11-07 DIAGNOSIS — R109 Unspecified abdominal pain: Secondary | ICD-10-CM | POA: Diagnosis not present

## 2023-11-07 DIAGNOSIS — E66811 Obesity, class 1: Secondary | ICD-10-CM | POA: Diagnosis present

## 2023-11-07 DIAGNOSIS — Z87891 Personal history of nicotine dependence: Secondary | ICD-10-CM

## 2023-11-07 DIAGNOSIS — Z79899 Other long term (current) drug therapy: Secondary | ICD-10-CM

## 2023-11-07 DIAGNOSIS — Z6834 Body mass index (BMI) 34.0-34.9, adult: Secondary | ICD-10-CM | POA: Diagnosis not present

## 2023-11-07 DIAGNOSIS — K5730 Diverticulosis of large intestine without perforation or abscess without bleeding: Secondary | ICD-10-CM | POA: Diagnosis not present

## 2023-11-07 DIAGNOSIS — M810 Age-related osteoporosis without current pathological fracture: Secondary | ICD-10-CM | POA: Diagnosis present

## 2023-11-07 DIAGNOSIS — E1129 Type 2 diabetes mellitus with other diabetic kidney complication: Secondary | ICD-10-CM | POA: Diagnosis not present

## 2023-11-07 DIAGNOSIS — I890 Lymphedema, not elsewhere classified: Secondary | ICD-10-CM | POA: Diagnosis not present

## 2023-11-07 DIAGNOSIS — A4152 Sepsis due to Pseudomonas: Secondary | ICD-10-CM | POA: Diagnosis present

## 2023-11-07 DIAGNOSIS — E039 Hypothyroidism, unspecified: Secondary | ICD-10-CM | POA: Diagnosis present

## 2023-11-07 DIAGNOSIS — S72002D Fracture of unspecified part of neck of left femur, subsequent encounter for closed fracture with routine healing: Secondary | ICD-10-CM | POA: Diagnosis not present

## 2023-11-07 DIAGNOSIS — D509 Iron deficiency anemia, unspecified: Secondary | ICD-10-CM | POA: Diagnosis not present

## 2023-11-07 DIAGNOSIS — A4151 Sepsis due to Escherichia coli [E. coli]: Secondary | ICD-10-CM | POA: Diagnosis not present

## 2023-11-07 DIAGNOSIS — M48061 Spinal stenosis, lumbar region without neurogenic claudication: Secondary | ICD-10-CM | POA: Diagnosis not present

## 2023-11-07 DIAGNOSIS — Z86711 Personal history of pulmonary embolism: Secondary | ICD-10-CM

## 2023-11-07 DIAGNOSIS — D3501 Benign neoplasm of right adrenal gland: Secondary | ICD-10-CM | POA: Diagnosis not present

## 2023-11-07 DIAGNOSIS — I10 Essential (primary) hypertension: Secondary | ICD-10-CM | POA: Diagnosis not present

## 2023-11-07 DIAGNOSIS — H0100B Unspecified blepharitis left eye, upper and lower eyelids: Secondary | ICD-10-CM | POA: Diagnosis present

## 2023-11-07 DIAGNOSIS — M6281 Muscle weakness (generalized): Secondary | ICD-10-CM | POA: Diagnosis not present

## 2023-11-07 DIAGNOSIS — H0100A Unspecified blepharitis right eye, upper and lower eyelids: Secondary | ICD-10-CM | POA: Diagnosis present

## 2023-11-07 LAB — CBC
HCT: 41.5 % (ref 36.0–46.0)
Hemoglobin: 13.3 g/dL (ref 12.0–15.0)
MCH: 28.4 pg (ref 26.0–34.0)
MCHC: 32 g/dL (ref 30.0–36.0)
MCV: 88.5 fL (ref 80.0–100.0)
Platelets: 270 K/uL (ref 150–400)
RBC: 4.69 MIL/uL (ref 3.87–5.11)
RDW: 17.7 % — ABNORMAL HIGH (ref 11.5–15.5)
WBC: 11.2 K/uL — ABNORMAL HIGH (ref 4.0–10.5)
nRBC: 0 % (ref 0.0–0.2)

## 2023-11-07 NOTE — ED Triage Notes (Signed)
 Patient arrives via [facility] from home for lower abdominal pain and pressure that started tonight. Indwelling foley in place with cloudy urine and sediment present. Husband reports to ems that patient was more confused during the day.    BP 159/95, SPO2 93%, CBG 169

## 2023-11-07 NOTE — Telephone Encounter (Signed)
 Called to approve verbal orders per pcp. Agent verbalized understanding and has no questions or concerns.

## 2023-11-07 NOTE — Telephone Encounter (Signed)
Verbal orders approved.

## 2023-11-08 ENCOUNTER — Inpatient Hospital Stay

## 2023-11-08 ENCOUNTER — Emergency Department

## 2023-11-08 DIAGNOSIS — Z9049 Acquired absence of other specified parts of digestive tract: Secondary | ICD-10-CM | POA: Diagnosis not present

## 2023-11-08 DIAGNOSIS — I6523 Occlusion and stenosis of bilateral carotid arteries: Secondary | ICD-10-CM | POA: Diagnosis not present

## 2023-11-08 DIAGNOSIS — N179 Acute kidney failure, unspecified: Secondary | ICD-10-CM | POA: Diagnosis present

## 2023-11-08 DIAGNOSIS — R4182 Altered mental status, unspecified: Secondary | ICD-10-CM | POA: Diagnosis not present

## 2023-11-08 DIAGNOSIS — E11621 Type 2 diabetes mellitus with foot ulcer: Secondary | ICD-10-CM | POA: Diagnosis present

## 2023-11-08 DIAGNOSIS — I130 Hypertensive heart and chronic kidney disease with heart failure and stage 1 through stage 4 chronic kidney disease, or unspecified chronic kidney disease: Secondary | ICD-10-CM | POA: Diagnosis present

## 2023-11-08 DIAGNOSIS — Y846 Urinary catheterization as the cause of abnormal reaction of the patient, or of later complication, without mention of misadventure at the time of the procedure: Secondary | ICD-10-CM | POA: Diagnosis present

## 2023-11-08 DIAGNOSIS — E876 Hypokalemia: Secondary | ICD-10-CM | POA: Diagnosis present

## 2023-11-08 DIAGNOSIS — Z7401 Bed confinement status: Secondary | ICD-10-CM | POA: Diagnosis not present

## 2023-11-08 DIAGNOSIS — N390 Urinary tract infection, site not specified: Secondary | ICD-10-CM | POA: Diagnosis present

## 2023-11-08 DIAGNOSIS — R0989 Other specified symptoms and signs involving the circulatory and respiratory systems: Secondary | ICD-10-CM | POA: Diagnosis not present

## 2023-11-08 DIAGNOSIS — R1084 Generalized abdominal pain: Secondary | ICD-10-CM | POA: Diagnosis not present

## 2023-11-08 DIAGNOSIS — K5730 Diverticulosis of large intestine without perforation or abscess without bleeding: Secondary | ICD-10-CM | POA: Diagnosis not present

## 2023-11-08 DIAGNOSIS — Z515 Encounter for palliative care: Secondary | ICD-10-CM | POA: Diagnosis not present

## 2023-11-08 DIAGNOSIS — M25562 Pain in left knee: Secondary | ICD-10-CM | POA: Diagnosis not present

## 2023-11-08 DIAGNOSIS — G9341 Metabolic encephalopathy: Secondary | ICD-10-CM | POA: Diagnosis not present

## 2023-11-08 DIAGNOSIS — K5641 Fecal impaction: Secondary | ICD-10-CM | POA: Diagnosis present

## 2023-11-08 DIAGNOSIS — E871 Hypo-osmolality and hyponatremia: Secondary | ICD-10-CM | POA: Diagnosis present

## 2023-11-08 DIAGNOSIS — I10 Essential (primary) hypertension: Secondary | ICD-10-CM | POA: Diagnosis not present

## 2023-11-08 DIAGNOSIS — I5032 Chronic diastolic (congestive) heart failure: Secondary | ICD-10-CM | POA: Diagnosis present

## 2023-11-08 DIAGNOSIS — Z833 Family history of diabetes mellitus: Secondary | ICD-10-CM | POA: Diagnosis not present

## 2023-11-08 DIAGNOSIS — R1030 Lower abdominal pain, unspecified: Secondary | ICD-10-CM | POA: Diagnosis not present

## 2023-11-08 DIAGNOSIS — A419 Sepsis, unspecified organism: Secondary | ICD-10-CM | POA: Diagnosis present

## 2023-11-08 DIAGNOSIS — R918 Other nonspecific abnormal finding of lung field: Secondary | ICD-10-CM | POA: Diagnosis not present

## 2023-11-08 DIAGNOSIS — T83518A Infection and inflammatory reaction due to other urinary catheter, initial encounter: Secondary | ICD-10-CM | POA: Diagnosis present

## 2023-11-08 DIAGNOSIS — L97429 Non-pressure chronic ulcer of left heel and midfoot with unspecified severity: Secondary | ICD-10-CM | POA: Diagnosis present

## 2023-11-08 DIAGNOSIS — D509 Iron deficiency anemia, unspecified: Secondary | ICD-10-CM | POA: Diagnosis not present

## 2023-11-08 DIAGNOSIS — J9601 Acute respiratory failure with hypoxia: Secondary | ICD-10-CM

## 2023-11-08 DIAGNOSIS — B952 Enterococcus as the cause of diseases classified elsewhere: Secondary | ICD-10-CM | POA: Diagnosis present

## 2023-11-08 DIAGNOSIS — Z7901 Long term (current) use of anticoagulants: Secondary | ICD-10-CM | POA: Diagnosis not present

## 2023-11-08 DIAGNOSIS — Z6834 Body mass index (BMI) 34.0-34.9, adult: Secondary | ICD-10-CM | POA: Diagnosis not present

## 2023-11-08 DIAGNOSIS — E1122 Type 2 diabetes mellitus with diabetic chronic kidney disease: Secondary | ICD-10-CM | POA: Diagnosis present

## 2023-11-08 DIAGNOSIS — J449 Chronic obstructive pulmonary disease, unspecified: Secondary | ICD-10-CM | POA: Diagnosis present

## 2023-11-08 DIAGNOSIS — R140 Abdominal distension (gaseous): Secondary | ICD-10-CM | POA: Diagnosis not present

## 2023-11-08 DIAGNOSIS — Z79890 Hormone replacement therapy (postmenopausal): Secondary | ICD-10-CM | POA: Diagnosis not present

## 2023-11-08 DIAGNOSIS — B965 Pseudomonas (aeruginosa) (mallei) (pseudomallei) as the cause of diseases classified elsewhere: Secondary | ICD-10-CM | POA: Diagnosis present

## 2023-11-08 DIAGNOSIS — Z66 Do not resuscitate: Secondary | ICD-10-CM | POA: Diagnosis present

## 2023-11-08 DIAGNOSIS — D3501 Benign neoplasm of right adrenal gland: Secondary | ICD-10-CM | POA: Diagnosis not present

## 2023-11-08 DIAGNOSIS — F0390 Unspecified dementia without behavioral disturbance: Secondary | ICD-10-CM | POA: Diagnosis present

## 2023-11-08 DIAGNOSIS — S72002A Fracture of unspecified part of neck of left femur, initial encounter for closed fracture: Secondary | ICD-10-CM | POA: Diagnosis not present

## 2023-11-08 DIAGNOSIS — N1831 Chronic kidney disease, stage 3a: Secondary | ICD-10-CM | POA: Diagnosis present

## 2023-11-08 DIAGNOSIS — M1712 Unilateral primary osteoarthritis, left knee: Secondary | ICD-10-CM | POA: Diagnosis not present

## 2023-11-08 DIAGNOSIS — R531 Weakness: Secondary | ICD-10-CM | POA: Diagnosis not present

## 2023-11-08 DIAGNOSIS — E039 Hypothyroidism, unspecified: Secondary | ICD-10-CM | POA: Diagnosis present

## 2023-11-08 DIAGNOSIS — G9389 Other specified disorders of brain: Secondary | ICD-10-CM | POA: Diagnosis not present

## 2023-11-08 DIAGNOSIS — R109 Unspecified abdominal pain: Secondary | ICD-10-CM | POA: Diagnosis not present

## 2023-11-08 LAB — CBC
HCT: 38.4 % (ref 36.0–46.0)
Hemoglobin: 12.2 g/dL (ref 12.0–15.0)
MCH: 28.6 pg (ref 26.0–34.0)
MCHC: 31.8 g/dL (ref 30.0–36.0)
MCV: 89.9 fL (ref 80.0–100.0)
Platelets: 224 K/uL (ref 150–400)
RBC: 4.27 MIL/uL (ref 3.87–5.11)
RDW: 17.8 % — ABNORMAL HIGH (ref 11.5–15.5)
WBC: 6.6 K/uL (ref 4.0–10.5)
nRBC: 0 % (ref 0.0–0.2)

## 2023-11-08 LAB — BASIC METABOLIC PANEL WITH GFR
Anion gap: 14 (ref 5–15)
BUN: 37 mg/dL — ABNORMAL HIGH (ref 8–23)
CO2: 32 mmol/L (ref 22–32)
Calcium: 9 mg/dL (ref 8.9–10.3)
Chloride: 90 mmol/L — ABNORMAL LOW (ref 98–111)
Creatinine, Ser: 1.41 mg/dL — ABNORMAL HIGH (ref 0.44–1.00)
GFR, Estimated: 38 mL/min — ABNORMAL LOW (ref >60)
Glucose, Bld: 91 mg/dL (ref 70–99)
Potassium: 4.3 mmol/L (ref 3.5–5.1)
Sodium: 136 mmol/L (ref 135–145)

## 2023-11-08 LAB — CBG MONITORING, ED
Glucose-Capillary: 116 mg/dL — ABNORMAL HIGH (ref 70–99)
Glucose-Capillary: 83 mg/dL (ref 70–99)
Glucose-Capillary: 93 mg/dL (ref 70–99)
Glucose-Capillary: 99 mg/dL (ref 70–99)

## 2023-11-08 LAB — COMPREHENSIVE METABOLIC PANEL WITH GFR
ALT: 13 U/L (ref 0–44)
AST: 21 U/L (ref 15–41)
Albumin: 3.7 g/dL (ref 3.5–5.0)
Alkaline Phosphatase: 72 U/L (ref 38–126)
Anion gap: 17 — ABNORMAL HIGH (ref 5–15)
BUN: 38 mg/dL — ABNORMAL HIGH (ref 8–23)
CO2: 28 mmol/L (ref 22–32)
Calcium: 10 mg/dL (ref 8.9–10.3)
Chloride: 88 mmol/L — ABNORMAL LOW (ref 98–111)
Creatinine, Ser: 1.28 mg/dL — ABNORMAL HIGH (ref 0.44–1.00)
GFR, Estimated: 42 mL/min — ABNORMAL LOW (ref >60)
Glucose, Bld: 171 mg/dL — ABNORMAL HIGH (ref 70–99)
Potassium: 3.4 mmol/L — ABNORMAL LOW (ref 3.5–5.1)
Sodium: 133 mmol/L — ABNORMAL LOW (ref 135–145)
Total Bilirubin: 1.2 mg/dL (ref 0.0–1.2)
Total Protein: 7.3 g/dL (ref 6.5–8.1)

## 2023-11-08 LAB — URINALYSIS, ROUTINE W REFLEX MICROSCOPIC
Bilirubin Urine: NEGATIVE
Glucose, UA: NEGATIVE mg/dL
Ketones, ur: NEGATIVE mg/dL
Nitrite: NEGATIVE
Protein, ur: 100 mg/dL — AB
RBC / HPF: >50 RBC/hpf (ref 0–5)
Specific Gravity, Urine: 1.015 (ref 1.005–1.030)
WBC, UA: >50 WBC/hpf (ref 0–5)
pH: 5 (ref 5.0–8.0)

## 2023-11-08 LAB — LIPASE, BLOOD: Lipase: 58 U/L — ABNORMAL HIGH (ref 11–51)

## 2023-11-08 MED ORDER — RENA-VITE PO TABS
1.0000 | ORAL_TABLET | Freq: Every day | ORAL | Status: DC
  Administered 2023-11-08 – 2023-11-22 (×16): 1 via ORAL
  Filled 2023-11-08 (×15): qty 1

## 2023-11-08 MED ORDER — APIXABAN 5 MG PO TABS
5.0000 mg | ORAL_TABLET | Freq: Two times a day (BID) | ORAL | Status: DC
  Filled 2023-11-08: qty 1

## 2023-11-08 MED ORDER — INSULIN ASPART 100 UNIT/ML IJ SOLN: 0.0000 [IU] | Freq: Every day | INTRAMUSCULAR | Status: DC

## 2023-11-08 MED ORDER — ACETAMINOPHEN 650 MG RE SUPP: 650.0000 mg | Freq: Four times a day (QID) | RECTAL | Status: DC | PRN

## 2023-11-08 MED ORDER — ONDANSETRON HCL 4 MG/2ML IJ SOLN: 4.0000 mg | Freq: Once | INTRAMUSCULAR | Status: AC

## 2023-11-08 MED ORDER — PANTOPRAZOLE SODIUM 40 MG PO TBEC
40.0000 mg | DELAYED_RELEASE_TABLET | Freq: Every day | ORAL | Status: DC
  Filled 2023-11-08: qty 1

## 2023-11-08 MED ORDER — MORPHINE SULFATE (PF) 4 MG/ML IV SOLN
4.0000 mg | Freq: Once | INTRAVENOUS | Status: AC
  Administered 2023-11-08 (×2): 4 mg via INTRAVENOUS
  Filled 2023-11-08: qty 1

## 2023-11-08 MED ORDER — IPRATROPIUM-ALBUTEROL 0.5-2.5 (3) MG/3ML IN SOLN
3.0000 mL | Freq: Four times a day (QID) | RESPIRATORY_TRACT | Status: DC
  Administered 2023-11-08 – 2023-11-09 (×8): 3 mL via RESPIRATORY_TRACT
  Filled 2023-11-08 (×5): qty 3

## 2023-11-08 MED ORDER — ONDANSETRON HCL 4 MG PO TABS: 4.0000 mg | ORAL_TABLET | Freq: Four times a day (QID) | ORAL | Status: DC | PRN

## 2023-11-08 MED ORDER — POTASSIUM CHLORIDE IN NACL 20-0.9 MEQ/L-% IV SOLN
INTRAVENOUS | Status: DC
  Filled 2023-11-08 (×2): qty 1000

## 2023-11-08 MED ORDER — SODIUM CHLORIDE 0.9 % IV SOLN
1.0000 g | INTRAVENOUS | Status: DC
  Administered 2023-11-08 (×2): 1 g via INTRAVENOUS
  Filled 2023-11-08: qty 10

## 2023-11-08 MED ORDER — ACETAMINOPHEN 325 MG PO TABS
650.0000 mg | ORAL_TABLET | Freq: Four times a day (QID) | ORAL | Status: DC | PRN
Start: 2023-11-08 — End: 2023-11-23
  Administered 2023-11-11 – 2023-11-15 (×4): 650 mg via ORAL
  Filled 2023-11-08 (×4): qty 2

## 2023-11-08 MED ORDER — SODIUM CHLORIDE 0.9 % IV SOLN
1.0000 g | Freq: Once | INTRAVENOUS | Status: AC
  Administered 2023-11-08 (×2): 1 g via INTRAVENOUS
  Filled 2023-11-08: qty 10

## 2023-11-08 MED ORDER — ONDANSETRON HCL 4 MG/2ML IJ SOLN
4.0000 mg | Freq: Four times a day (QID) | INTRAMUSCULAR | Status: DC | PRN
  Administered 2023-11-15 – 2023-11-22 (×5): 4 mg via INTRAVENOUS
  Filled 2023-11-08 (×5): qty 2

## 2023-11-08 MED ORDER — ONDANSETRON HCL 4 MG/2ML IJ SOLN
INTRAMUSCULAR | Status: AC
  Administered 2023-11-08 (×2): 4 mg via INTRAVENOUS
  Filled 2023-11-08: qty 2

## 2023-11-08 MED ORDER — METOPROLOL SUCCINATE ER 25 MG PO TB24
25.0000 mg | ORAL_TABLET | Freq: Every day | ORAL | Status: DC
  Filled 2023-11-08: qty 1

## 2023-11-08 MED ORDER — FERROUS SULFATE 325 (65 FE) MG PO TABS
325.0000 mg | ORAL_TABLET | Freq: Every day | ORAL | Status: DC
  Filled 2023-11-08: qty 1

## 2023-11-08 MED ORDER — INSULIN ASPART 100 UNIT/ML IJ SOLN
0.0000 [IU] | Freq: Three times a day (TID) | INTRAMUSCULAR | Status: DC
  Filled 2023-11-08: qty 1

## 2023-11-08 MED ORDER — COLLAGENASE 250 UNIT/GM EX OINT
TOPICAL_OINTMENT | Freq: Every day | CUTANEOUS | Status: DC
  Filled 2023-11-08: qty 30

## 2023-11-08 MED ORDER — LEVOTHYROXINE SODIUM 50 MCG PO TABS: 100.0000 ug | ORAL_TABLET | Freq: Every day | ORAL | Status: DC

## 2023-11-08 MED ORDER — MEDIHONEY WOUND/BURN DRESSING EX PSTE
1.0000 | PASTE | Freq: Every day | CUTANEOUS | Status: DC
  Filled 2023-11-08: qty 44

## 2023-11-08 MED ORDER — IOHEXOL 300 MG/ML  SOLN
80.0000 mL | Freq: Once | INTRAMUSCULAR | Status: AC | PRN
  Administered 2023-11-08 (×2): 80 mL via INTRAVENOUS

## 2023-11-08 MED ORDER — BISACODYL 10 MG RE SUPP
10.0000 mg | Freq: Once | RECTAL | Status: AC
  Administered 2023-11-08 (×2): 10 mg via RECTAL
  Filled 2023-11-08: qty 1

## 2023-11-08 MED ORDER — INSULIN ASPART 100 UNIT/ML IJ SOLN: 0.0000 [IU] | INTRAMUSCULAR | Status: DC

## 2023-11-08 MED ORDER — INSULIN ASPART 100 UNIT/ML IJ SOLN: 0.0000 [IU] | Freq: Three times a day (TID) | INTRAMUSCULAR | Status: DC

## 2023-11-08 MED ORDER — ENOXAPARIN SODIUM 80 MG/0.8ML IJ SOSY
80.0000 mg | PREFILLED_SYRINGE | Freq: Two times a day (BID) | INTRAMUSCULAR | Status: DC
  Administered 2023-11-08 – 2023-11-09 (×5): 80 mg via SUBCUTANEOUS
  Filled 2023-11-08 (×3): qty 0.8

## 2023-11-08 MED ORDER — SODIUM CHLORIDE 0.9% FLUSH
3.0000 mL | Freq: Two times a day (BID) | INTRAVENOUS | Status: DC
  Administered 2023-11-08 – 2023-11-23 (×31): 3 mL via INTRAVENOUS

## 2023-11-08 MED ORDER — PANTOPRAZOLE SODIUM 40 MG IV SOLR
80.0000 mg | Freq: Once | INTRAVENOUS | Status: AC
  Administered 2023-11-08 (×2): 80 mg via INTRAVENOUS
  Filled 2023-11-08: qty 20

## 2023-11-08 NOTE — ED Notes (Signed)
 Family updated as to patient's status.

## 2023-11-08 NOTE — ED Notes (Signed)
 Lab at bedside to collect blood cultures.

## 2023-11-08 NOTE — ED Notes (Signed)
 Pt passed swallow screen. Given [place] sandwich tray per request. Ate 25%.

## 2023-11-08 NOTE — Group Note (Deleted)
 Date:  11/08/2023 Time:  2:22 PM  Group Topic/Focus:  Wellness Toolbox:   The focus of this group is to discuss various aspects of wellness, balancing those aspects and exploring ways to increase the ability to experience wellness.  Patients will create a wellness toolbox for use upon discharge.     Participation Level:  {BHH PARTICIPATION OZCZO:77735}  Participation Quality:  {BHH PARTICIPATION QUALITY:22265}  Affect:  {BHH AFFECT:22266}  Cognitive:  {BHH COGNITIVE:22267}  Insight: {BHH Insight2:20797}  Engagement in Group:  {BHH ENGAGEMENT IN HMNLE:77731}  Modes of Intervention:  {BHH MODES OF INTERVENTION:22269}  Additional Comments:  ***  Myra Curtistine BROCKS 11/08/2023, 2:22 PM

## 2023-11-08 NOTE — Consult Note (Signed)
 WOC Nurse Consult Note: Please note that the WOC nursing team is utilizing a standardized work plan to manage patient consults. We are triaging consults and will try to see the patients within 48 hours. Wound photos in the patient's chart allow us  to consult on the patient in the most efficient and timely manner.   Reason for Consult: Consult requested for left heel. Performed remotely after review of the progress notes and previous photo in the EMR.  Pt is familiar to [facility] nurse from previous admission on 7/28, when the wound was assessed by podiatry at that time and they ordered Santyl . Left heel with Unstageable pressure injury Pressure Injury POA: Yes Dressing procedure/placement/frequency: Prevalon boot to reduce pressure. Continue previous plan of care as follows: topical treatment orders provided for bedside nurses to perform as follows to assist with removal of nonviable tissue: Apply Santyl  to left heel wound Q day, then cover with moist gauze and dry 4X4 and kerlex.  Please re-consult if further assistance is needed.  Thank-you,  Stephane Fought MSN, RN, CWOCN, CWCN-AP, CNS Contact Mon-Fri 0700-1500: [phone]

## 2023-11-08 NOTE — H&P (Signed)
 History and Physical    Patient: Stacy Kirk FMW:[id number] DOB: 11-Apr-1945 DOA: 11/07/2023 DOS: the patient was seen and examined on 11/08/2023 PCP: Wendee Lynwood HERO, NP  Patient coming from: Home  Chief Complaint:  Chief Complaint  Patient presents with   Abdominal Pain   HPI: Stacy Kirk is a 78 y.o. female with medical history significant of heart failure preserved EF, PE on Eliquis , history of ovarian cancer, hypothyroidism, obesity, left hip fracture in June s/p surgical repair, CKD IIIa, COPD with recent hospitalization for exacerbation, left heel ulcer, diabetes mellitus 2, suspected baseline dementia, recent UTI and recurrent urinary retention prompting discharge with Foley catheter in place and recommendation for outpatient follow-up with urology.  No family at bedside during my evaluation of patient.  She was discharged on 8/1 as stated above regarding COPD exacerbation.  Patient was brought to the ED from home with complaints of lower abdominal pain and pressure that started Tuesday night.  Foley catheter in place notable for cloudy urine and sediment in drainage bag.  Husband reported patient had been more confused than baseline.  At presentation she was hemodynamically stable, O2 sat 93% and CBG was 169.  While in the ED she has remained hemodynamically stable.  She was placed on oxygen to maintain sats greater than 95%.  Labs revealed hyponatremia, hypokalemia, stable renal function, mildly elevated anion gap with normal glucose, lipase 58, leukocytosis with differential not obtained.  Urinalysis was abnormal with turbid appearance, large hemoglobin, large leukocytes, many bacteria, greater than 50 WBCs, urine culture was obtained.  Foley catheter was changed out by the ED staff.  CT abdomen and pelvis unremarkable except for mild fecal impaction.  Hospital service has been asked to evaluate the patient for admission.  Upon my evaluation of the patient she was very lethargic  and difficult to awaken.  Staff nurse states that she had been given a small dose of IV morphine  because she was very agitated and screaming out in pain.  Review of Systems: As mentioned in the history of present illness. All other systems reviewed and are negative.  Past Medical History:  Diagnosis Date   Arthritis    Cancer (HCC)    ovarian, skin face- skin cancer   Complication of anesthesia    slow to awaken everday, even slower with anesthesia   Constipation    Diabetes mellitus    Type II   GERD (gastroesophageal reflux disease)    Headache 05/12/2015   after a fall headache for 8 months   Hemorrhoids    Hernia    History of kidney stones    passed   Hypertension    Hypothyroidism    Loss of appetite    Muscle spasm    back   Osteoporosis    Redness    abd wound   Urine frequency    unable to hold urine    Wears hearing aid    bilateral    Past Surgical History:  Procedure Laterality Date   ABDOMINAL HYSTERECTOMY     APPENDECTOMY     CHOLECYSTECTOMY     COLONOSCOPY W/ POLYPECTOMY     HERNIA REPAIR  Jun2012   open component separation w biologic mesh (Dr. Vanderbilt)   INTRAMEDULLARY (IM) NAIL INTERTROCHANTERIC Left 09/09/2023   Procedure: FIXATION, FRACTURE, INTERTROCHANTERIC, WITH INTRAMEDULLARY ROD;  Surgeon: Lorelle Hussar, MD;  Location: [facility] ORS;  Service: Orthopedics;  Laterality: Left;   TOE SURGERY Left    2nd toe   TONSILLECTOMY  Social History:  reports that she quit smoking about 25 years ago. Her smoking use included cigarettes. She started smoking about 40 years ago. She has been exposed to tobacco smoke. She has never used smokeless tobacco. She reports that she does not drink alcohol  and does not use drugs.  Allergies  Allergen Reactions   Erythromycin  Diarrhea and Other (See Comments)    Tears her stomach up. Major digestive upset.   Tramadol Shortness Of Breath and Other (See Comments)    Felt like lungs filled up; unable to lay down    Albumin (Human) Other (See Comments)    Pt unsure of reaction   Aspirin  Itching and Other (See Comments)    Caused lethargy, also   Codeine Itching and Other (See Comments)    Cause lethargy, also   Cortisone Other (See Comments)    NO STEROID INJECTIONS; headaches   Linzess  [Linaclotide ] Diarrhea   Propoxyphene Other (See Comments)    Terrible reaction; headaches   Simvastatin Itching and Other (See Comments)    Severe muscle aches   Triamcinolone Acetonide Other (See Comments)    headache    Family History  Problem Relation Age of Onset   Diabetes Mother    Early death Sister    Drug abuse Sister     Prior to Admission medications   Medication Sig Start Date End Date Taking? Authorizing Provider  acetaminophen  (TYLENOL ) 500 MG tablet Take 500 mg by mouth every 6 (six) hours as needed (FOR PAIN.).   Yes [provider]  cetirizine  (ZYRTEC ) 10 MG tablet Take 1 tablet (10 mg total) by mouth daily. 06/07/23  Yes Wendee Lynwood HERO, NP  collagenase  (SANTYL ) 250 UNIT/GM ointment Apply topically daily. 10/26/23  Yes Awanda City, MD  ELIQUIS  5 MG TABS tablet TAKE 1 TABLET BY MOUTH TWICE A DAY 05/02/23  Yes Wendee Lynwood HERO, NP  ferrous sulfate  325 (65 FE) MG tablet TAKE 325 MG BY MOUTH DAILY. 08/30/23  Yes Wendee Lynwood HERO, NP  levothyroxine  (SYNTHROID ) 100 MCG tablet TAKE 1 TABLET BY MOUTH EVERY DAY 10/30/23  Yes Wendee Lynwood HERO, NP  metoprolol  succinate (TOPROL -XL) 25 MG 24 hr tablet Take 1 tablet (25 mg total) by mouth daily. 03/16/23  Yes Wendee Lynwood HERO, NP  multivitamin (RENA-VIT) TABS tablet Take 1 tablet by mouth at bedtime. 09/14/23  Yes Awanda City, MD  pantoprazole  (PROTONIX ) 40 MG tablet TAKE 1 TABLET BY MOUTH EVERY DAY 10/30/23  Yes Wendee Lynwood HERO, NP  senna-docusate (SENOKOT-S) 8.6-50 MG tablet Take 2 tablets by mouth 2 (two) times daily as needed for mild constipation. 10/26/23  Yes Awanda City, MD  spironolactone  (ALDACTONE ) 25 MG tablet Take 1 tablet (25 mg total) by mouth daily. Home  med. 10/27/23  Yes Awanda City, MD  torsemide  (DEMADEX ) 20 MG tablet Take 2 tablets (40 mg total) by mouth daily. Home med. 10/27/23  Yes Awanda City, MD  lactose free nutrition (BOOST PLUS) LIQD Take 237 mLs by mouth 2 (two) times daily between meals. 09/14/23   Awanda City, MD  lidocaine  (LIDODERM ) 5 % Place 1-2 patches onto the skin at bedtime. Remove & Discard patch within 12 hours or as directed by MD Patient not taking: Reported on 10/31/2023 09/14/23   Awanda City, MD  methocarbamol  (ROBAXIN ) 500 MG tablet Take 1 tablet (500 mg total) by mouth every 8 (eight) hours as needed for muscle spasms. Patient not taking: Reported on 10/31/2023 09/14/23   Awanda City, MD  polyethylene glycol (MIRALAX  /  GLYCOLAX ) 17 g packet Take 17 g by mouth 2 (two) times daily. With a glass of fluid. Patient not taking: Reported on 10/31/2023 10/26/23   Awanda City, MD    Physical Exam: Vitals:   11/08/23 0755 11/08/23 0830 11/08/23 0930 11/08/23 1030  BP: 109/79 96/71 121/65 109/64  Pulse: 84 85 80 84  Resp: 14 18 14 11   Temp:      TempSrc:      SpO2: 100% 100% 100% 100%  Weight:      Height:       Constitutional: Lethargic and somewhat toxic in appearance Respiratory: clear to auscultation bilaterally, no wheezing, no crackles. Normal respiratory effort. No accessory muscle use.  Cardiovascular: Regular rate and rhythm, no murmurs / rubs / gallops. No extremity edema.  Hands and feet cool to touch with mildly delayed capillary refill. Abdomen: Some suprapubic tenderness, no masses palpated. No hepatosplenomegaly. Bowel sounds positive but hypoactive.  Abdomen appears somewhat distended. Genitourinary: Foley catheter in place notable for cloudy amber-colored urine. Musculoskeletal: no clubbing / cyanosis. No joint deformity upper and lower extremities. Good ROM, no contractures. Normal muscle tone.  Skin: no rashes, lesions, he does have a left heel ulcer that was not examined.  There is a Manufacturing engineer in place. Neurologic: CN  2-12 grossly intact based on inspection only. Sensation intact.  Due to patient's altered mentation she was unable to effectively participate with strength testing Psychiatric: Initially unresponsive but after significant tactile stimulation she opened her eyes and was able to mumble a few responses.  She becomes slightly agitated when stimulated but then quickly drifts back into a lethargic state  Data Reviewed:  As per HPI  Assessment and Plan: Recurrent UTI/suspected evolving sepsis History of acute urinary retention requiring Foley catheter at discharge Patient presents with labs and symptoms consistent with UTI Previous culture with 40,000 colonies of E. coli resistant to ampicillin  and Unasyn Initiate Rocephin  Patient appears to have some decreased peripheral perfusion as noted above cool hands and feet and decreased capillary refill-continue to monitor in the progressive setting Follow-up on urine culture.  Obtain blood cultures Had significant abdominal pain resulting in agitation.  This has improved with administration of IV morphine  although patient now having worsening altered mentation CT abdomen and pelvis unremarkable except for rectal constipation  Acute metabolic encephalopathy and likely underlying dementia Occurred during previous admission and was worsened by UTI Further worsened by requirement for IV morphine  for abdominal pain Continue to follow Suspect patient has underlying dementia N.p.o. until more alert IV fluids  History of PE on Eliquis  Not alert enough to take p.o. so have transition to full dose Lovenox  with pharmacy dosing  Diabetes mellitus 2 Not on medications at home Follow CBGs and provide SSI  COPD During previous admission had decompensated to the point of requiring BiPAP Not on maintenance medications at home Continue O2 as needed DuoNebs as needed  HFpEF For now hold Toprol  XL, Aldactone  and Demadex  Daily weights and follow intake and  output  Hypothyroidism Hold Synthroid  until able to take oral medications  Left hip fracture in June PT consultation  Left heel ulcer On Santyl  with basic wound care Wound care nurse consultation for additional recommendation Evaluated by podiatry during previous admission  Rectal constipation From recent decreased mobility and requirement for pain medication Dulcolax suppository x 1 Also takes iron  for anemia       Advance Care Planning:   Code Status: Limited: Do not attempt resuscitation (DNR) -DNR-LIMITED -Do Not  Intubate/DNI     VTE prophylaxis: Full dose Lovenox  until alert enough to transition back to home Eliquis   Consults: None  Family Communication: Patient only noting no family at bedside  Severity of Illness: The appropriate patient status for this patient is INPATIENT. Inpatient status is judged to be reasonable and necessary in order to provide the required intensity of service to ensure the patient's safety. The patient's presenting symptoms, physical exam findings, and initial radiographic and laboratory data in the context of their chronic comorbidities is felt to place them at high risk for further clinical deterioration. Furthermore, it is not anticipated that the patient will be medically stable for discharge from the hospital within 2 midnights of admission.   * I certify that at the point of admission it is my clinical judgment that the patient will require inpatient hospital care spanning beyond 2 midnights from the point of admission due to high intensity of service, high risk for further deterioration and high frequency of surveillance required.*  Author: Isaiah Lever, NP 11/08/2023 10:43 AM  For on call review [email or web address].

## 2023-11-08 NOTE — ED Notes (Signed)
 Patient still very lethargic at this time.  Arousable to voice.

## 2023-11-08 NOTE — ED Notes (Signed)
 MD notified patient having black tarry stool but negative Fecal Occult blood test.  Then possible bleeding from vagina.  Unable to tell.  Patient was cleaned up and will reassess and continue to monitor.

## 2023-11-08 NOTE — Consult Note (Signed)
 PHARMACY - ANTICOAGULATION CONSULT NOTE  Pharmacy Consult for Enoxaparin  Indication: history of atrial fibrillation and iliac stents  Allergies  Allergen Reactions   Erythromycin  Diarrhea and Other (See Comments)    Tears her stomach up. Major digestive upset.   Tramadol Shortness Of Breath and Other (See Comments)    Felt like lungs filled up; unable to lay down   Albumin (Human) Other (See Comments)    Pt unsure of reaction   Aspirin  Itching and Other (See Comments)    Caused lethargy, also   Codeine Itching and Other (See Comments)    Cause lethargy, also   Cortisone Other (See Comments)    NO STEROID INJECTIONS; headaches   Linzess  [Linaclotide ] Diarrhea   Propoxyphene Other (See Comments)    Terrible reaction; headaches   Simvastatin Itching and Other (See Comments)    Severe muscle aches   Triamcinolone Acetonide Other (See Comments)    headache    Patient Measurements: Height: 5' 2 (157.5 cm) Weight: 84.4 kg (186 lb) IBW/kg (Calculated) : 50.1 HEPARIN DW (KG): 69.1  Vital Signs: Temp: 97.8 F (36.6 C) (08/13 0722) Temp Source: Axillary (08/13 0357) BP: 109/64 (08/13 1030) Pulse Rate: 84 (08/13 1030)  Labs: Recent Labs    11/07/23 2345  HGB 13.3  HCT 41.5  PLT 270  CREATININE 1.28*    Estimated Creatinine Clearance: 35.3 mL/min (A) (by C-G formula based on SCr of 1.28 mg/dL (H)).   Medical History: Past Medical History:  Diagnosis Date   Arthritis    Cancer (HCC)    ovarian, skin face- skin cancer   Complication of anesthesia    slow to awaken everday, even slower with anesthesia   Constipation    Diabetes mellitus    Type II   GERD (gastroesophageal reflux disease)    Headache 05/12/2015   after a fall headache for 8 months   Hemorrhoids    Hernia    History of kidney stones    passed   Hypertension    Hypothyroidism    Loss of appetite    Muscle spasm    back   Osteoporosis    Redness    abd wound   Urine frequency     unable to hold urine    Wears hearing aid    bilateral     Medications:  Apixaban  5mg  BID Last dose: 8/12 in the AM  Assessment: 78 y.o. female with past medical history of diastolic CHF, PE on Eliquis , hypertension, diabetes, hypothyroid, GI bleeding, CKD, lymphedema, ovarian cancer, lumbar spinal stenosis, left hip fracture surgery in June, recent hospitalization for AMS/shortness of breath found to be in COPD exacerbation, urinary tract infection and urinary retention with indwelling Foley catheter placement, acute metabolic encephalopathy, chronic foot ulcer, presents to our department tonight for lower abdominal pain and pressure that started last late on 8/12. Patient currently NPO. Pharmacy has been consulted to transition from apixaban  to therapeutic enoxaparin .   Goal of Therapy:  Anti-Xa level 0.6-1 units/ml 4hrs after LMWH dose given Monitor platelets by anticoagulation protocol: Yes   Plan:  Enoxaparin  80mg  Q12 hours Will check anti-Xa level at steady state CBC daily  Seif Teichert A Noralee Dutko 11/08/2023,11:05 AM

## 2023-11-08 NOTE — ED Provider Notes (Addendum)
 [facility] Provider Note    Event Date/Time   First MD Initiated Contact with Patient 11/07/23 2334     (approximate)   History   Abdominal Pain   HPI  Stacy Kirk is a 78 y.o. female   Past medical history of diastolic CHF, PE on Eliquis , hypertension, diabetes, hypothyroid, GI bleeding, CKD, lymphedema, ovarian cancer, lumbar spinal stenosis, left hip fracture surgery in June, recent hospitalization for AMS/shortness of breath found to be in COPD exacerbation, urinary tract infection and urinary retention with indwelling Foley catheter placement, acute metabolic encephalopathy, chronic foot ulcer, presents to our department tonight for lower abdominal pain and pressure that started tonight.  Noted to have cloudy urine and sediment present in the Foley bag.  Husband reports she has been more confused during the day and is complaining of lower back and lower abdominal pain.  She is yelling out in pain complaining of lower abdominal and lower back pain.  She feels better after getting initial dose of morphine .  She is able to tell me that she experienced pain onset earlier today.  She reports no trauma.  She reports no heel pain and knows that she has chronic ulcer there.  She at times does appear to be slow to respond and confused as she is difficult to stay on track, but is easily redirectable and answers my questions appropriately otherwise.  She reports no chest pain, shortness of breath or fever.    External Medical Documents Reviewed: Hospital records from recent admission      Physical Exam   Triage Vital Signs: ED Triage Vitals [11/07/23 2345]  Encounter Vitals Group     BP (!) 156/114     Girls Systolic BP Percentile      Girls Diastolic BP Percentile      Boys Systolic BP Percentile      Boys Diastolic BP Percentile      Pulse Rate 89     Resp (!) 21     Temp 98.3 F (36.8 C)     Temp Source Oral     SpO2 97 %     Weight       Height      Head Circumference      Peak Flow      Pain Score      Pain Loc      Pain Education      Exclude from Growth Chart     Most recent vital signs: Vitals:   11/07/23 2345  BP: (!) 156/114  Pulse: 89  Resp: (!) 21  Temp: 98.3 F (36.8 C)  SpO2: 97%    General: Awake, no distress.  CV:  Good peripheral perfusion.  Resp:  Normal effort.  Abd:  No distention.  Other:  Indwelling Foley in place with cloudy urine.  She has diffuse abdominal tenderness to palpation without rigidity or guarding.  She does have very dark appearing black stool but is guaiac negative.  Slightly hypertensive, no fever.  Mentation appears okay, reportedly confused, sometimes slow to respond, sometimes get off track, but that is easily redirectable and answers my questions appropriately.  The chronic heel ulcer noted on recent admission note does not appear actively infected   ED Results / Procedures / Treatments   Labs (all labs ordered are listed, but only abnormal results are displayed) Labs Reviewed  LIPASE, BLOOD - Abnormal; Notable for the following components:      Result Value   Lipase  58 (*)    All other components within normal limits  COMPREHENSIVE METABOLIC PANEL WITH GFR - Abnormal; Notable for the following components:   Sodium 133 (*)    Potassium 3.4 (*)    Chloride 88 (*)    Glucose, Bld 171 (*)    BUN 38 (*)    Creatinine, Ser 1.28 (*)    GFR, Estimated 42 (*)    Anion gap 17 (*)    All other components within normal limits  CBC - Abnormal; Notable for the following components:   WBC 11.2 (*)    RDW 17.7 (*)    All other components within normal limits  URINALYSIS, ROUTINE W REFLEX MICROSCOPIC - Abnormal; Notable for the following components:   Color, Urine AMBER (*)    APPearance TURBID (*)    Hgb urine dipstick LARGE (*)    Protein, ur 100 (*)    Leukocytes,Ua LARGE (*)    Bacteria, UA MANY (*)    Non Squamous Epithelial PRESENT (*)    All other components  within normal limits     I ordered and reviewed the above labs they are notable for white count slightly elevated 11.2.  Creatinine slightly elevated from prior at 1.28 today. EKG  ED ECG REPORT I, Ginnie Shams, the attending physician, personally viewed and interpreted this ECG.   Date: 11/08/2023  EKG Time: 0022  Rate: 101  Rhythm: sinus tachycardia  Axis: nl  Intervals: Occasional PVC, prolonged QTc slightly at 502  ST&T Change: No STEMI.    RADIOLOGY I independently reviewed and interpreted CT of the abdomen pelvis to see no obvious inflammatory obstructive pathologies though large stool burden. I also reviewed radiologist's formal read.   PROCEDURES:  Critical Care performed: No  Procedures   MEDICATIONS ORDERED IN ED: Medications  cefTRIAXone  (ROCEPHIN ) 1 g in sodium chloride  0.9 % 100 mL IVPB (1 g Intravenous New Bag/Given 11/08/23 0135)  morphine  (PF) 4 MG/ML injection 4 mg (4 mg Intravenous Given 11/08/23 0012)  ondansetron  (ZOFRAN ) injection 4 mg (4 mg Intravenous Given 11/08/23 0010)  iohexol  (OMNIPAQUE ) 300 MG/ML solution 80 mL (80 mLs Intravenous Contrast Given 11/08/23 0044)  pantoprazole  (PROTONIX ) injection 80 mg (80 mg Intravenous Given 11/08/23 0134)    External physician / consultants:  I spoke with hospital medicine for admission and regarding care plan for this patient.   IMPRESSION / MDM / ASSESSMENT AND PLAN / ED COURSE  I reviewed the triage vital signs and the nursing notes.                                Patient's presentation is most consistent with acute presentation with potential threat to life or bodily function.  Differential diagnosis includes, but is not limited to, acute metabolic encephalopathy, dehydration, electrolyte disturbance, intra-abdominal infection or obstruction, urinary tract infection  The patient is on the cardiac monitor to evaluate for evidence of arrhythmia and/or significant heart rate changes.  MDM:    She may  have a urinary tract infection as she has hardware in place chronic indwelling Foley catheter with very cloudy urine and metabolic encephalopathy due to her confusion likely due to her infection.  She does have abdominal pain mostly in the lower abdomen which may be consistent with cystitis though with a diffusely tender nonperitoneal abdomen exam we will get a CT scan to look for other infectious obstructive or surgical pathologies.  She responded very well  to IV morphine .  I gave her initial dose of IV ceftriaxone  given high clinical suspicion for urinary tract infection, based off of the urine culture obtained most recently last month.  She has a very large stool burden on CT scan made a large bowel movement that was dark-colored but guaiac negative after CT scan completed.  Her pain may be due to constipation.  No frank evidence of melena or upper GI bleed we will give her Protonix  just in case given the dark nature of her stool.  I anticipate admission given suspicion for infection and acute metabolic encephalopathy due to the infection.   CT scan shows mild fecal impaction and constipation though after the CAT scan she did make a large bowel movement.  No other acute abnormalities noted.  Urinalysis does indicate urinary tract infection, sample was drawn off of a new Foley catheter as the original Foley was exchanged.  Patient is feeling better and stable.  Admission for urinary tract infection and metabolic encephalopathy.      FINAL CLINICAL IMPRESSION(S) / ED DIAGNOSES   Final diagnoses:  Generalized abdominal pain  Urinary tract infection without hematuria, site unspecified  Acute metabolic encephalopathy     Rx / DC Orders   ED Discharge Orders     None        Note:  This document was prepared using Dragon voice recognition software and may include unintentional dictation errors.    Cyrena Mylar, MD 11/08/23 TONNIE    Cyrena Mylar, MD 11/08/23 9866    Cyrena Mylar,  MD 11/08/23 [phone]

## 2023-11-09 DIAGNOSIS — R4182 Altered mental status, unspecified: Secondary | ICD-10-CM | POA: Diagnosis present

## 2023-11-09 DIAGNOSIS — N390 Urinary tract infection, site not specified: Secondary | ICD-10-CM | POA: Diagnosis not present

## 2023-11-09 LAB — BASIC METABOLIC PANEL WITH GFR
Anion gap: 13 (ref 5–15)
BUN: 41 mg/dL — ABNORMAL HIGH (ref 8–23)
CO2: 28 mmol/L (ref 22–32)
Calcium: 8.9 mg/dL (ref 8.9–10.3)
Chloride: 95 mmol/L — ABNORMAL LOW (ref 98–111)
Creatinine, Ser: 1.64 mg/dL — ABNORMAL HIGH (ref 0.44–1.00)
GFR, Estimated: 31 mL/min — ABNORMAL LOW (ref >60)
Glucose, Bld: 115 mg/dL — ABNORMAL HIGH (ref 70–99)
Potassium: 4.1 mmol/L (ref 3.5–5.1)
Sodium: 136 mmol/L (ref 135–145)

## 2023-11-09 LAB — CBC
HCT: 38.2 % (ref 36.0–46.0)
Hemoglobin: 11.8 g/dL — ABNORMAL LOW (ref 12.0–15.0)
MCH: 28.2 pg (ref 26.0–34.0)
MCHC: 30.9 g/dL (ref 30.0–36.0)
MCV: 91.4 fL (ref 80.0–100.0)
Platelets: 264 K/uL (ref 150–400)
RBC: 4.18 MIL/uL (ref 3.87–5.11)
RDW: 17.8 % — ABNORMAL HIGH (ref 11.5–15.5)
WBC: 7.3 K/uL (ref 4.0–10.5)
nRBC: 0 % (ref 0.0–0.2)

## 2023-11-09 LAB — CBG MONITORING, ED: Glucose-Capillary: 108 mg/dL — ABNORMAL HIGH (ref 70–99)

## 2023-11-09 LAB — GLUCOSE, CAPILLARY
Glucose-Capillary: 102 mg/dL — ABNORMAL HIGH (ref 70–99)
Glucose-Capillary: 92 mg/dL (ref 70–99)
Glucose-Capillary: 136 mg/dL — ABNORMAL HIGH (ref 70–99)

## 2023-11-09 MED ORDER — IPRATROPIUM-ALBUTEROL 0.5-2.5 (3) MG/3ML IN SOLN
3.0000 mL | Freq: Three times a day (TID) | RESPIRATORY_TRACT | Status: DC
  Administered 2023-11-09 – 2023-11-10 (×3): 3 mL via RESPIRATORY_TRACT
  Filled 2023-11-09 (×3): qty 3

## 2023-11-09 MED ORDER — METOPROLOL SUCCINATE ER 25 MG PO TB24
25.0000 mg | ORAL_TABLET | Freq: Every day | ORAL | Status: DC
Start: 2023-11-09 — End: 2023-11-23
  Administered 2023-11-09 – 2023-11-22 (×11): 25 mg via ORAL
  Filled 2023-11-09 (×14): qty 1

## 2023-11-09 MED ORDER — SODIUM CHLORIDE 0.9 % IV SOLN
2.0000 g | INTRAVENOUS | Status: DC
  Administered 2023-11-09: 2 g via INTRAVENOUS
  Filled 2023-11-09 (×2): qty 12.5

## 2023-11-09 MED ORDER — APIXABAN 5 MG PO TABS
5.0000 mg | ORAL_TABLET | Freq: Two times a day (BID) | ORAL | Status: DC
  Administered 2023-11-09 – 2023-11-19 (×20): 5 mg via ORAL
  Filled 2023-11-09 (×21): qty 1

## 2023-11-09 MED ORDER — LEVOTHYROXINE SODIUM 100 MCG PO TABS
100.0000 ug | ORAL_TABLET | Freq: Every day | ORAL | Status: DC
  Administered 2023-11-09 – 2023-11-23 (×15): 100 ug via ORAL
  Filled 2023-11-09 (×16): qty 1

## 2023-11-09 MED ORDER — CHLORHEXIDINE GLUCONATE CLOTH 2 % EX PADS
6.0000 | MEDICATED_PAD | Freq: Every day | CUTANEOUS | Status: DC
  Administered 2023-11-09 – 2023-11-23 (×14): 6 via TOPICAL

## 2023-11-09 NOTE — ED Notes (Signed)
 Pt desatting in lower 80's. [place] was removed upon assessment, [place] replaced. Pt still sating in 80-90's range. Pt coached to take deep breaths and 02 raised to 4L. Pt sat at 98%. Will titrate when appropriate. PT at bedside.

## 2023-11-09 NOTE — ED Notes (Addendum)
 This RN and Dorian RN assisted patient with hygiene by changing the patient's bedsheets, bedpad, brief, and blankets. New socks put on. Bed bath performed with bath wipes. Pericare and foley care performed with bathwipes. Pt stated urge to have bowel movement and desire to get out of the bed to sit on toilet. With 2 person assist, patient was assisted to sit on edge of bed with difficulty. Pt attempted to stand x3 but was unable to do so. Assisted back into bed. Monitoring equipment resumed.

## 2023-11-09 NOTE — NC FL2 (Signed)
 [place]  MEDICAID FL2 LEVEL OF CARE FORM     IDENTIFICATION  Patient Name: Stacy Kirk Birthdate: [birth year] Sex: female Admission Date (Current Location): 11/07/2023  [facility] and [place] Number:      Facility and Address:  [facility], [address] [place], [place] [address]      Provider Number: [id number]  Attending Physician Name and Address:  Lenon Marien CROME, MD  Relative Name and Phone Number:  Lunn,[name] (Spouse)  [phone]    Current Level of Care: Hospital Recommended Level of Care: Skilled Nursing Facility Prior Approval Number:    Date Approved/Denied:   PASRR Number: [id number]     Taken  Discharge Plan:      Current Diagnoses: Patient Active Problem List   Diagnosis Date Noted   AMS (altered mental status) 11/09/2023   Complicated urinary tract infection 11/08/2023   Sepsis due to urinary tract infection (HCC) 11/08/2023   Hypophosphatemia 10/24/2023   COPD with acute exacerbation (HCC) 10/21/2023   Altered mental status 10/20/2023   Acute on chronic respiratory failure with hypercapnia (HCC) 10/20/2023   Acute respiratory failure with hypoxia and hypercapnia (HCC) 10/19/2023   UTI (urinary tract infection) 10/19/2023   Acute metabolic encephalopathy 10/19/2023   Hypotension 10/19/2023   Iron  deficiency anemia 10/19/2023   Hypomagnesemia 10/19/2023   Acute urinary retention 10/19/2023   Closed left hip fracture, initial encounter (HCC) 09/09/2023   Fall at home, initial encounter 09/09/2023   Type II diabetes mellitus with renal manifestations (HCC) 09/09/2023   Chronic kidney disease, stage 3a (HCC) 09/09/2023   Chronic diastolic CHF (congestive heart failure) (HCC) 09/09/2023   Obesity (BMI 30-39.9) 09/09/2023   Closed left hip fracture (HCC) 09/09/2023   Closed comminuted intertrochanteric fracture of proximal end of left femur (HCC) 09/09/2023   Adventitious breath sounds 06/30/2023   Acute cough  06/30/2023   PND (post-nasal drip) 06/07/2023   Abnormal urinalysis 06/07/2023   Incomplete bladder emptying 05/30/2023   Hematoma 05/30/2023   Chills 05/30/2023   Epistaxis 05/12/2023   Visit for suture removal 05/12/2023   Lymphedema 04/14/2023   Acute non-recurrent pansinusitis 03/03/2023   Pulmonary embolism (HCC) 12/09/2022   Hospital discharge follow-up 11/29/2022   Bilateral lower extremity edema 11/29/2022   Left leg weakness 11/29/2022   Rectal bleed 11/23/2022   Lower GI bleed 11/22/2022   Chronic systolic heart failure (HCC) 11/22/2022   GI bleed 11/22/2022   BRBPR (bright red blood per rectum) 11/22/2022   Lumbar compression fracture (HCC) 10/02/2022   Closed compression fracture of L2 lumbar vertebra, initial encounter (HCC) 09/30/2022   Hypokalemia 09/30/2022   Hypertension 09/30/2022   Controlled type 2 diabetes mellitus with complication, without long-term current use of insulin  (HCC) 08/05/2022   Chronic constipation 08/05/2022   OAB (overactive bladder) 08/05/2022   Hypothyroidism 08/05/2022   Fatigue 08/05/2022   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-L5 level 09/07/2016   Abdominal pain, RLQ 01/24/2011   Abdominal right lower quadrant swelling 01/24/2011   Low back pain 01/24/2011   Knee pain, right 01/24/2011    Orientation RESPIRATION BLADDER Height & Weight     Self    Incontinent Weight: 83.5 kg Height:  5' 2 (157.5 cm)  BEHAVIORAL SYMPTOMS/MOOD NEUROLOGICAL BOWEL NUTRITION STATUS      Incontinent Diet (Regular)  AMBULATORY STATUS COMMUNICATION OF NEEDS Skin   Limited Assist Verbally  Personal Care Assistance Level of Assistance  Bathing, Feeding, Dressing Bathing Assistance: Limited assistance Feeding assistance: Independent Dressing Assistance: Limited assistance     Functional Limitations Info  Hearing   Hearing Info: Impaired      SPECIAL CARE FACTORS FREQUENCY  PT (By licensed PT), OT (By licensed OT)     PT  Frequency: 5x week OT Frequency: 5  xweek            Contractures      Additional Factors Info  Code Status, Allergies Code Status Info: DNR Allergies Info: Tramadol,   Albumin (Human),    Aspirin ,  Codeine,  Cortisone,  Linzess  (Linaclotide ),  Propoxyphene,    Simvastatin,   Triamcinolone Acetonide,  Erythromycin            Current Medications (11/09/2023):  This is the current hospital active medication list Current Facility-Administered Medications  Medication Dose Route Frequency Provider Last Rate Last Admin   acetaminophen  (TYLENOL ) tablet 650 mg  650 mg Oral Q6H PRN Alto Isaiah CROME, NP       Or   acetaminophen  (TYLENOL ) suppository 650 mg  650 mg Rectal Q6H PRN Alto Isaiah CROME, NP       apixaban  (ELIQUIS ) tablet 5 mg  5 mg Oral BID Lenon Marien CROME, MD       ceFEPIme  (MAXIPIME ) 2 g in sodium chloride  0.9 % 100 mL IVPB  2 g Intravenous Q24H Lenon Marien L, MD 200 mL/hr at 11/09/23 1320 2 g at 11/09/23 1320   Chlorhexidine  Gluconate Cloth 2 % PADS 6 each  6 each Topical Daily Lenon Marien CROME, MD       collagenase  (SANTYL ) ointment   Topical Daily Lenon Marien CROME, MD   Given at 11/09/23 1313   insulin  aspart (novoLOG ) injection 0-9 Units  0-9 Units Subcutaneous TID WC Lenon Marien CROME, MD       ipratropium-albuterol  (DUONEB) 0.5-2.5 (3) MG/3ML nebulizer solution 3 mL  3 mL Nebulization TID Lenon Marien CROME, MD       levothyroxine  (SYNTHROID ) tablet 100 mcg  100 mcg Oral Q0600 Lenon Marien CROME, MD   100 mcg at 11/09/23 1538   metoprolol  succinate (TOPROL -XL) 24 hr tablet 25 mg  25 mg Oral Daily Lenon Marien CROME, MD   25 mg at 11/09/23 1538   multivitamin (RENA-VIT) tablet 1 tablet  1 tablet Oral QHS Alto Isaiah CROME, NP   1 tablet at 11/08/23 2317   ondansetron  (ZOFRAN ) tablet 4 mg  4 mg Oral Q6H PRN Alto Isaiah CROME, NP       Or   ondansetron  (ZOFRAN ) injection 4 mg  4 mg Intravenous Q6H PRN Alto Isaiah CROME, NP       sodium chloride  flush (NS) 0.9  % injection 3 mL  3 mL Intravenous Q12H Alto Isaiah CROME, NP   3 mL at 11/09/23 1033     Discharge Medications: Please see discharge summary for a list of discharge medications.  Relevant Imaging Results:  Relevant Lab Results:   Additional Information SSN: [id number]  Dalia GORMAN Fuse, RN

## 2023-11-09 NOTE — ED Notes (Signed)
 This tech checked and replaced EKG stickers, pt had accidentally ripped a few stickers off. Pt is clean, dry, and call bell within reach. No further needs at this time.

## 2023-11-09 NOTE — ED Notes (Signed)
 This RN was able to put leads back on patient. This RN educated patient about the importance of cardiac monitoring. Patient agreeable.

## 2023-11-09 NOTE — ED Notes (Signed)
 Repeatedly in room to address call bell and IV pump alarms. Pt states she is leaving today so no longer requires monitoring accessories. Taking cardiac monitoring leads and stickers off and handing to [facility].

## 2023-11-09 NOTE — TOC Initial Note (Signed)
 Transition of Care [facility]) - Initial/Assessment Note    Patient Details  Name: Stacy Kirk MRN: [id number] Date of Birth: [birth year]  [facility] CM/SW Contact:    Dalia GORMAN Fuse, RN Phone Number: 11/09/2023, 4:07 PM  Clinical Narrative:                 TOC visited the patient in the room. The patient was unable to hear bc she left her hearing aids. The patient gave [facility] permission to speak with her husband. TOC called the patient's husband to discuss therapy recommendations for STR. He advised the patient has been to [facility] several times and he hopes she can go back there. TOC sent FL2 to [place]  Expected Discharge Plan: Skilled Nursing Facility Barriers to Discharge: Continued Medical Work up, SNF Pending bed offer, Insurance Authorization   Patient Goals and CMS Choice            Expected Discharge Plan and Services   Discharge Planning Services: CM Consult   Living arrangements for the past 2 months: Single Family Home                                      Prior Living Arrangements/Services Living arrangements for the past 2 months: Single Family Home Lives with:: Spouse                   Activities of Daily Living   ADL Screening (condition at time of admission) Independently performs ADLs?: No Does the patient have a NEW difficulty with bathing/dressing/toileting/self-feeding that is expected to last >3 days?: No Does the patient have a NEW difficulty with getting in/out of bed, walking, or climbing stairs that is expected to last >3 days?: No Does the patient have a NEW difficulty with communication that is expected to last >3 days?: No Is the patient deaf or have difficulty hearing?: No Does the patient have difficulty seeing, even when wearing glasses/contacts?: No Does the patient have difficulty concentrating, remembering, or making decisions?: Yes  Permission Sought/Granted                  Emotional  Assessment              Admission diagnosis:  Generalized abdominal pain [R10.84] Complicated urinary tract infection [N39.0] Sepsis due to urinary tract infection (HCC) [A41.9, N39.0] Urinary tract infection without hematuria, site unspecified [N39.0] Acute metabolic encephalopathy [G93.41] AMS (altered mental status) [R41.82] Patient Active Problem List   Diagnosis Date Noted   AMS (altered mental status) 11/09/2023   Complicated urinary tract infection 11/08/2023   Sepsis due to urinary tract infection (HCC) 11/08/2023   Hypophosphatemia 10/24/2023   COPD with acute exacerbation (HCC) 10/21/2023   Altered mental status 10/20/2023   Acute on chronic respiratory failure with hypercapnia (HCC) 10/20/2023   Acute respiratory failure with hypoxia and hypercapnia (HCC) 10/19/2023   UTI (urinary tract infection) 10/19/2023   Acute metabolic encephalopathy 10/19/2023   Hypotension 10/19/2023   Iron  deficiency anemia 10/19/2023   Hypomagnesemia 10/19/2023   Acute urinary retention 10/19/2023   Closed left hip fracture, initial encounter (HCC) 09/09/2023   Fall at home, initial encounter 09/09/2023   Type II diabetes mellitus with renal manifestations (HCC) 09/09/2023   Chronic kidney disease, stage 3a (HCC) 09/09/2023   Chronic diastolic CHF (congestive heart failure) (HCC) 09/09/2023   Obesity (BMI 30-39.9) 09/09/2023  Closed left hip fracture (HCC) 09/09/2023   Closed comminuted intertrochanteric fracture of proximal end of left femur (HCC) 09/09/2023   Adventitious breath sounds 06/30/2023   Acute cough 06/30/2023   PND (post-nasal drip) 06/07/2023   Abnormal urinalysis 06/07/2023   Incomplete bladder emptying 05/30/2023   Hematoma 05/30/2023   Chills 05/30/2023   Epistaxis 05/12/2023   Visit for suture removal 05/12/2023   Lymphedema 04/14/2023   Acute non-recurrent pansinusitis 03/03/2023   Pulmonary embolism (HCC) 12/09/2022   Hospital discharge follow-up 11/29/2022    Bilateral lower extremity edema 11/29/2022   Left leg weakness 11/29/2022   Rectal bleed 11/23/2022   Lower GI bleed 11/22/2022   Chronic systolic heart failure (HCC) 11/22/2022   GI bleed 11/22/2022   BRBPR (bright red blood per rectum) 11/22/2022   Lumbar compression fracture (HCC) 10/02/2022   Closed compression fracture of L2 lumbar vertebra, initial encounter (HCC) 09/30/2022   Hypokalemia 09/30/2022   Hypertension 09/30/2022   Controlled type 2 diabetes mellitus with complication, without long-term current use of insulin  (HCC) 08/05/2022   Chronic constipation 08/05/2022   OAB (overactive bladder) 08/05/2022   Hypothyroidism 08/05/2022   Fatigue 08/05/2022   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-L5 level 09/07/2016   Abdominal pain, RLQ 01/24/2011   Abdominal right lower quadrant swelling 01/24/2011   Low back pain 01/24/2011   Knee pain, right 01/24/2011   PCP:  Wendee Lynwood HERO, NP Pharmacy:   [id number] - [address], [place] - [address] [place] [address] Phone: [phone] Fax: [phone]     Social Drivers of Health (SDOH) Social History: SDOH Screenings   Food Insecurity: No Food Insecurity (11/08/2023)  Recent Concern: Food Insecurity - Food Insecurity Present (09/27/2023)   Received from [facility]  Housing: Low Risk  (11/08/2023)  Transportation Needs: No Transportation Needs (11/08/2023)  Utilities: Not At Risk (11/08/2023)  Financial Resource Strain: Medium Risk (09/27/2023)   Received from [facility]  Social Connections: Unknown (11/08/2023)  Tobacco Use: Medium Risk (11/07/2023)   SDOH Interventions:     Readmission Risk Interventions     No data to display

## 2023-11-09 NOTE — Evaluation (Addendum)
 Physical Therapy Evaluation Patient Details Name: Stacy Kirk MRN: [id number] DOB: Dec 12, 1945 Today's Date: 11/09/2023  History of Present Illness  Stacy Kirk is a 78 y.o. female  Past medical history of diastolic CHF, PE on Eliquis , hypertension, diabetes, hypothyroid, GI bleeding, CKD, lymphedema, ovarian cancer, lumbar spinal stenosis, left hip fracture surgery in June, recent hospitalization for AMS/shortness of breath found to be in COPD exacerbation, urinary tract infection and urinary retention with indwelling Foley catheter placement, acute metabolic encephalopathy, chronic foot ulcer, presents to our department tonight for lower abdominal pain and pressure that started tonight.   Clinical Impression  Pt admitted with above diagnosis. Pt currently with functional limitations due to the deficits listed below (see PT Problem List). Pt received upright in bed agreeable to PT with encouragement with minimal activity. Pt is a poor historian. Reports ambulating in room distances using RW without husband support. Per recent admission, pt bed bound since L hip fx earlier this year.   To date, pt relied on maximal encouragement to participate. Pt is reliant on maxA to sit EOB with heavy modA+1 to maintain static sitting for ~3-5 minutes. Declines further mobility efforts due to being cold and her abdomen hurting. maxA+1 to return to supine in bed with all needs in reach. Anticipate pt may be at her baseline. Will put on PT trial and confirm with family as available on more accurate PLOF. Pt with all needs in reach.      If plan is discharge home, recommend the following: Two people to help with walking and/or transfers;Two people to help with bathing/dressing/bathroom;Help with stairs or ramp for entrance;Assist for transportation;Assistance with cooking/housework   Can travel by private vehicle   No    Equipment Recommendations    Recommendations for Other Services        Functional Status Assessment Patient has had a recent decline in their functional status and/or demonstrates limited ability to make significant improvements in function in a reasonable and predictable amount of time     Precautions / Restrictions Precautions Precautions: Fall Recall of Precautions/Restrictions: Impaired Restrictions Weight Bearing Restrictions Per Provider Order: No Other Position/Activity Restrictions: Prevlon boot donned on LLE for heel wound      Mobility  Bed Mobility Overal bed mobility: Needs Assistance Bed Mobility: Supine to Sit, Sit to Supine     Supine to sit: Max assist Sit to supine: Max assist   General bed mobility comments: VC's for sequencing UE's and LE's. Reliant on bed features and external support. modA once sitting to maintain static sitting. Patient Response: Flat affect, Cooperative  Transfers                   General transfer comment: declines attempts    Ambulation/Gait               General Gait Details: unable at this time  Stairs            Wheelchair Mobility     Tilt Bed Tilt Bed Patient Response: Flat affect, Cooperative  Modified Rankin (Stroke Patients Only)       Balance Overall balance assessment: Needs assistance Sitting-balance support: Feet unsupported, Bilateral upper extremity supported Sitting balance-Leahy Scale: Poor Sitting balance - Comments: modA to maintain sitting. Feet unsupported due to height of ED stretcher.  Pertinent Vitals/Pain Pain Assessment Pain Assessment: Faces Faces Pain Scale: Hurts whole lot Pain Location: LLE with movement Pain Descriptors / Indicators: Discomfort Pain Intervention(s): Limited activity within patient's tolerance, Monitored during session, Repositioned    Home Living Family/patient expects to be discharged to:: Private residence Living Arrangements: Spouse/significant other Available  Help at Discharge: Family;Available PRN/intermittently;Personal care attendant Type of Home: House Home Access: Ramped entrance       Home Layout: One level Home Equipment: Cane - single point;Grab bars - toilet;Grab bars - tub/shower;Rolling Walker (2 wheels)      Prior Function Prior Level of Function : Needs assist             Mobility Comments: Patient poor historian. Endorses gait with RW for household distances. Question likelihood. Other recent EMR stated pt mostly bed bound.       Extremity/Trunk Assessment   Upper Extremity Assessment Upper Extremity Assessment: Generalized weakness    Lower Extremity Assessment Lower Extremity Assessment: LLE deficits/detail LLE Deficits / Details: L foot remains inverted and plantarflexed. Has known Heel ulcer. LLE: Unable to fully assess due to pain    Cervical / Trunk Assessment Cervical / Trunk Assessment: Kyphotic  Communication   Communication Communication: No apparent difficulties    Cognition Arousal: Alert Behavior During Therapy: WFL for tasks assessed/performed   PT - Cognitive impairments: No apparent impairments                       PT - Cognition Comments: only agreeable to sit EOB with encouragement. Following commands: Intact Following commands impaired: Follows one step commands with increased time     Cueing Cueing Techniques: Verbal cues, Tactile cues     General Comments      Exercises     Assessment/Plan    PT Assessment Patient needs continued PT services  PT Problem List Decreased strength;Decreased range of motion;Decreased cognition;Decreased activity tolerance;Decreased balance       PT Treatment Interventions Balance training;Gait training;DME instruction;Functional mobility training;Patient/family education;Therapeutic activities;Therapeutic exercise;Cognitive remediation;Neuromuscular re-education    PT Goals (Current goals can be found in the Care Plan section)   Acute Rehab PT Goals PT Goal Formulation: Patient unable to participate in goal setting Time For Goal Achievement: 11/23/23    Frequency Min 2X/week     Co-evaluation               AM-PAC PT 6 Clicks Mobility  Outcome Measure Help needed turning from your back to your side while in a flat bed without using bedrails?: A Lot Help needed moving from lying on your back to sitting on the side of a flat bed without using bedrails?: Total Help needed moving to and from a bed to a chair (including a wheelchair)?: Total Help needed standing up from a chair using your arms (e.g., wheelchair or bedside chair)?: Total Help needed to walk in hospital room?: Total Help needed climbing 3-5 steps with a railing? : Total 6 Click Score: 7    End of Session   Activity Tolerance: Patient limited by pain;Other (comment) (unmotivated to participate) Patient left: in bed;with call bell/phone within reach;with bed alarm set Nurse Communication: Mobility status PT Visit Diagnosis: Other abnormalities of gait and mobility (R26.89);Muscle weakness (generalized) (M62.81);History of falling (Z91.81);Pain Pain - Right/Left: Left Pain - part of body: Hip;Knee;Ankle and joints of foot    Time: 9153-9095 PT Time Calculation (min) (ACUTE ONLY): 18 min   Charges:   PT Evaluation $PT Eval Moderate  Complexity: 1 Mod   PT General Charges $$ ACUTE PT VISIT: 1 Visit        Dorina HERO. Fairly IV, PT, DPT Physical Therapist-   [facility] 11/09/2023, 1:29 PM

## 2023-11-09 NOTE — Progress Notes (Signed)
 PROGRESS NOTE  Stacy Kirk    DOB: 05-31-1945, 78 y.o.  FMW:[id number]    Code Status: Limited: Do not attempt resuscitation (DNR) -DNR-LIMITED -Do Not Intubate/DNI    DOA: 11/07/2023   LOS: 1   Brief hospital course  Stacy Kirk is a 78 y.o. female with a PMH significant for heart failure preserved EF, PE on Eliquis , history of ovarian cancer, hypothyroidism, obesity, left hip fracture in June s/p surgical repair, CKD IIIa, COPD with recent hospitalization for exacerbation, left heel ulcer, diabetes mellitus 2, suspected baseline dementia, recent UTI and recurrent urinary retention prompting discharge with Foley catheter in place and recommendation for outpatient follow-up with urology. Presented to ED with lower abdominal pain. Per report from husband- she was constipated with several days without BM. She is also more confused than baseline and not able to stand or walk as usual. Of note, suffered from hip fracture 08/2023. ED course: hemodynamically stable, O2 sat 93% and CBG was 169. She was placed on oxygen to maintain sats greater than 95%.  Labs revealed hyponatremia, hypokalemia, stable renal function, mildly elevated anion gap with normal glucose, lipase 58, leukocytosis with differential not obtained.  Urinalysis was abnormal with turbid appearance, large hemoglobin, large leukocytes, many bacteria, greater than 50 WBCs, urine culture was obtained.  Foley catheter was changed out by the ED staff.  CT abdomen and pelvis unremarkable except for mild fecal impaction.  She was started on antibiotics for UTI as well as suppository for constipation.   11/09/23 -patient did have BM while in ED. Mental status much improved today, closer to baseline per report from husband. Worked with PT and is far from baseline mobility in part from acute issues but suspect also component still recovering from prior fracture, heal ulcer, and possibly advancing dementia. Head imaging negative for acute  changes. Chest xray negative consolidations.   Assessment & Plan  Principal Problem:   Complicated urinary tract infection Active Problems:   Sepsis due to urinary tract infection (HCC)  Recurrent UTI- indwelling foley since last admission due to acute urinary retention. Had urology f/u scheduled next week but husband cancelled once admitted to hospital.  - continue foley, consider voiding trial while inpatient vs outpatient follow up as originally planned.  - continue Abx and follow UxCx.  - continue PT/OT. TOC consulted for SNF   Acute metabolic encephalopathy and likely underlying dementia- appears back to baseline today. As attributed by infection, pain, and medication side effects on worsening dementia baseline.  - supportive care - PT/OT   History of PE on Eliquis - On 3L without consolidation on xray. Husband states she is adherent with medication at home so less likely recurrent clot burden contributing.  - continue home eliquis .    Diabetes mellitus 2 Not on medications at home Follow CBGs and provide SSI   COPD During previous admission had decompensated to the point of requiring BiPAP Not on maintenance medications at home Continue O2 as needed DuoNebs as needed - wean O2 as tolerated   HFpEF For now hold Toprol  XL, Aldactone  and Demadex  Daily weights and follow intake and output - titrating back on today. Had hypotension on day of admission but appears to have recovered.  - restart metoprolol     Hypothyroidism - continue home Synthroid     Left hip fracture in June PT consultation   Left heel ulcer On Santyl  with basic wound care Wound care nurse consultation for additional recommendation Evaluated by podiatry during previous admission   Rectal  constipation- s/p one suppository and a BM this admission  Body mass index is 34.02 kg/m.  VTE ppx: home eliquis    Diet:     Diet   Diet NPO time specified   Consultants: None   Subjective 11/09/23     Pt reports feeling better. She states she has no abdominal pain today. Is having heel pain preventing from walking.    Objective  Blood pressure (!) 128/97, pulse 79, temperature 99 F (37.2 C), temperature source Oral, resp. rate 19, height 5' 2 (1.575 m), weight 84.4 kg, SpO2 100%.  Intake/Output Summary (Last 24 hours) at 11/09/2023 0832 Last data filed at 11/09/2023 0326 Gross per 24 hour  Intake 1827.87 ml  Output 475 ml  Net 1352.87 ml   Filed Weights   11/08/23 0732  Weight: 84.4 kg    Physical Exam:  General: awake, alert, NAD HEENT: atraumatic, clear conjunctiva, anicteric sclera, MMM, hearing grossly normal Respiratory: normal respiratory effort. Cardiovascular: quick capillary refill, normal S1/S2, RRR, no JVD, murmurs Gastrointestinal: soft, NT, ND Nervous: A&O x3. no gross focal neurologic deficits, normal speech Extremities: moves all equally, no edema, normal tone Skin: dry, intact, normal temperature, normal color. No rashes, lesions or ulcers on exposed skin Psychiatry: normal mood, congruent affect  Labs   I have personally reviewed the following labs and imaging studies CBC    Component Value Date/Time   WBC 7.3 11/09/2023 0318   RBC 4.18 11/09/2023 0318   HGB 11.8 (L) 11/09/2023 0318   HCT 38.2 11/09/2023 0318   PLT 264 11/09/2023 0318   MCV 91.4 11/09/2023 0318   MCH 28.2 11/09/2023 0318   MCHC 30.9 11/09/2023 0318   RDW 17.8 (H) 11/09/2023 0318   LYMPHSABS 2.5 09/08/2023 2329   MONOABS 0.9 09/08/2023 2329   EOSABS 0.4 09/08/2023 2329   BASOSABS 0.0 09/08/2023 2329      Latest Ref Rng & Units 11/09/2023    3:18 AM 11/08/2023    1:19 PM 11/07/2023   11:45 PM  BMP  Glucose 70 - 99 mg/dL 884  91  828   BUN 8 - 23 mg/dL 41  37  38   Creatinine 0.44 - 1.00 mg/dL 8.35  8.58  8.71   Sodium 135 - 145 mmol/L 136  136  133   Potassium 3.5 - 5.1 mmol/L 4.1  4.3  3.4   Chloride 98 - 111 mmol/L 95  90  88   CO2 22 - 32 mmol/L 28  32  28   Calcium  8.9 - 10.3 mg/dL 8.9  9.0  89.9     DG Chest 2 View Result Date: 11/08/2023 CLINICAL DATA:  Lower abdominal pain and altered mental status. EXAM: CHEST - 2 VIEW COMPARISON:  October 21, 2023 FINDINGS: The cardiac silhouette is enlarged and unchanged in size. There is marked severity calcification of the aortic arch. Low lung volumes are noted. Mild left infrahilar scarring and/or atelectasis is seen. No pleural effusion or pneumothorax is identified. Multilevel degenerative changes seen throughout the thoracic spine. IMPRESSION: Low lung volumes with mild left infrahilar scarring and/or atelectasis. Electronically Signed   By: Suzen Dials M.D.   On: 11/08/2023 19:28   CT HEAD WO CONTRAST ( ) Result Date: 11/08/2023 CLINICAL DATA:  Altered mental status. EXAM: CT HEAD WITHOUT CONTRAST TECHNIQUE: Contiguous axial images were obtained from the base of the skull through the vertex without intravenous contrast. RADIATION DOSE REDUCTION: This exam was performed according to the departmental dose-optimization program which includes  automated exposure control, adjustment of the mA and/or kV according to patient size and/or use of iterative reconstruction technique. COMPARISON:  October 19, 2023 FINDINGS: Brain: There is generalized cerebral atrophy with widening of the extra-axial spaces and ventricular dilatation. There are areas of decreased attenuation within the white matter tracts of the supratentorial brain, consistent with microvascular disease changes. A small, chronic right basal ganglia lacunar infarct is seen. Vascular: Mild bilateral cavernous carotid artery calcification is noted. Skull: Normal. Negative for fracture or focal lesion. Sinuses/Orbits: No acute finding. Other: None. IMPRESSION: 1. Generalized cerebral atrophy and microvascular disease changes of the supratentorial brain. 2. Small, chronic right basal ganglia lacunar infarct. 3. No acute intracranial abnormality. Electronically Signed    By: Suzen Dials M.D.   On: 11/08/2023 19:26   CT ABDOMEN PELVIS W CONTRAST Result Date: 11/08/2023 CLINICAL DATA:  Acute abdominal pain EXAM: CT ABDOMEN AND PELVIS WITH CONTRAST TECHNIQUE: Multidetector CT imaging of the abdomen and pelvis was performed using the standard protocol following bolus administration of intravenous contrast. RADIATION DOSE REDUCTION: This exam was performed according to the departmental dose-optimization program which includes automated exposure control, adjustment of the mA and/or kV according to patient size and/or use of iterative reconstruction technique. CONTRAST:  80mL OMNIPAQUE  IOHEXOL  300 MG/ML  SOLN COMPARISON:  None Available. FINDINGS: Lower chest: Mild basilar atelectasis is noted. Hepatobiliary: No focal liver abnormality is seen. Status post cholecystectomy. No biliary dilatation. Pancreas: Unremarkable. No pancreatic ductal dilatation or surrounding inflammatory changes. Spleen: Normal in size without focal abnormality. Adrenals/Urinary Tract: Adrenal glands show a stable right adrenal lesion most consistent with adenoma. No follow-up is recommended. Kidneys demonstrate a normal enhancement pattern bilaterally. Prominent extrarenal pelvis is again noted on the right. No obstructive changes are seen. The bladder is decompressed by Foley catheter. Stomach/Bowel: Considerable fecal material is noted within the rectal vault consistent with mild rectal impaction. No colitis is seen. Scattered diverticular change of the colon is noted without diverticulitis. The appendix has been surgically removed. Small bowel and stomach are within normal limits. Vascular/Lymphatic: Aortic atherosclerosis. No enlarged abdominal or pelvic lymph nodes. Reproductive: Status post hysterectomy. No adnexal masses. Other: No abdominal wall hernia or abnormality. No abdominopelvic ascites. Musculoskeletal: Postsurgical changes in the left hip are noted. Degenerative changes of lumbar spine are  seen. Changes of prior fusion at L4-5 are seen. Chronic L2 compression deformity is noted. IMPRESSION: Changes consistent with mild fecal impaction. Stable right adrenal adenoma.  No follow-up is recommended. Diverticulosis without diverticulitis. Electronically Signed   By: Oneil Devonshire M.D.   On: 11/08/2023 01:21    Disposition Plan & Communication  Patient status: Inpatient  Admitted From: Home Planned disposition location: Skilled nursing facility Anticipated discharge date: 8/16 pending SNF workup   Family Communication: husband on phone    Author: Marien LITTIE Piety, DO [facility] 11/09/2023, 8:32 AM   Available by Epic secure chat 7AM-7PM. If 7PM-7AM, please contact night-coverage.  [facility] contact information found on [email or web address].

## 2023-11-10 DIAGNOSIS — N390 Urinary tract infection, site not specified: Secondary | ICD-10-CM | POA: Diagnosis not present

## 2023-11-10 LAB — CBC
HCT: 33.1 % — ABNORMAL LOW (ref 36.0–46.0)
Hemoglobin: 10.7 g/dL — ABNORMAL LOW (ref 12.0–15.0)
MCH: 28.7 pg (ref 26.0–34.0)
MCHC: 32.3 g/dL (ref 30.0–36.0)
MCV: 88.7 fL (ref 80.0–100.0)
Platelets: 224 K/uL (ref 150–400)
RBC: 3.73 MIL/uL — ABNORMAL LOW (ref 3.87–5.11)
RDW: 17.5 % — ABNORMAL HIGH (ref 11.5–15.5)
WBC: 5.1 K/uL (ref 4.0–10.5)
nRBC: 0 % (ref 0.0–0.2)

## 2023-11-10 LAB — COMPREHENSIVE METABOLIC PANEL WITH GFR
ALT: 11 U/L (ref 0–44)
AST: 15 U/L (ref 15–41)
Albumin: 2.8 g/dL — ABNORMAL LOW (ref 3.5–5.0)
Alkaline Phosphatase: 64 U/L (ref 38–126)
Anion gap: 10 (ref 5–15)
BUN: 38 mg/dL — ABNORMAL HIGH (ref 8–23)
CO2: 28 mmol/L (ref 22–32)
Calcium: 9.1 mg/dL (ref 8.9–10.3)
Chloride: 96 mmol/L — ABNORMAL LOW (ref 98–111)
Creatinine, Ser: 1.5 mg/dL — ABNORMAL HIGH (ref 0.44–1.00)
GFR, Estimated: 35 mL/min — ABNORMAL LOW (ref >60)
Glucose, Bld: 86 mg/dL (ref 70–99)
Potassium: 3.4 mmol/L — ABNORMAL LOW (ref 3.5–5.1)
Sodium: 134 mmol/L — ABNORMAL LOW (ref 135–145)
Total Bilirubin: 1.2 mg/dL (ref 0.0–1.2)
Total Protein: 6 g/dL — ABNORMAL LOW (ref 6.5–8.1)

## 2023-11-10 LAB — GLUCOSE, CAPILLARY
Glucose-Capillary: 91 mg/dL (ref 70–99)
Glucose-Capillary: 119 mg/dL — ABNORMAL HIGH (ref 70–99)
Glucose-Capillary: 124 mg/dL — ABNORMAL HIGH (ref 70–99)
Glucose-Capillary: 103 mg/dL — ABNORMAL HIGH (ref 70–99)

## 2023-11-10 MED ORDER — CIPROFLOXACIN HCL 500 MG PO TABS
500.0000 mg | ORAL_TABLET | Freq: Every day | ORAL | Status: AC
  Administered 2023-11-10 – 2023-11-15 (×6): 500 mg via ORAL
  Filled 2023-11-10 (×6): qty 1

## 2023-11-10 MED ORDER — POTASSIUM CHLORIDE 20 MEQ PO PACK
40.0000 meq | PACK | Freq: Once | ORAL | Status: DC
  Filled 2023-11-10: qty 2

## 2023-11-10 MED ORDER — IPRATROPIUM-ALBUTEROL 0.5-2.5 (3) MG/3ML IN SOLN: 3.0000 mL | RESPIRATORY_TRACT | Status: DC | PRN

## 2023-11-10 NOTE — Plan of Care (Signed)

## 2023-11-10 NOTE — TOC Progression Note (Addendum)
 Transition of Care [facility]) - Progression Note    Patient Details  Name: Stacy Kirk MRN: [id number] Date of Birth: 10/17/1945  Transition of [facility] CM/SW Contact  Dalia GORMAN Fuse, RN Phone Number: 11/10/2023, 8:54 AM  Clinical Narrative:    WONDA outreached to [facility] to see if they could offer a bed. The [facility] [place] advised they will accept the patient back;however, the patient is in her co-pays. The family would need to pay $200 a day for 7 days upfront. Finances was an issue for the family the last admission. TOC advised that she would f/u with the [facility] after speaking with the family.  TOC spoke with the patient's husband. He advised he has challenges helping her take care of herself. He would like for her to go to STR before returning to home. He will speak with Emmalene and make arrangements for the copay.  Expected Discharge Plan: Skilled Nursing Facility Barriers to Discharge: Continued Medical Work up, SNF Pending bed offer, English as a second language teacher               Expected Discharge Plan and Services   Discharge Planning Services: CM Consult   Living arrangements for the past 2 months: Single Family Home                                       Social Drivers of Health (SDOH) Interventions SDOH Screenings   Food Insecurity: No Food Insecurity (11/08/2023)  Recent Concern: Food Insecurity - Food Insecurity Present (09/27/2023)   Received from [facility]  Housing: Low Risk  (11/08/2023)  Transportation Needs: No Transportation Needs (11/08/2023)  Utilities: Not At Risk (11/08/2023)  Financial Resource Strain: Medium Risk (09/27/2023)   Received from [facility]  Social Connections: Unknown (11/08/2023)  Tobacco Use: Medium Risk (11/07/2023)    Readmission Risk Interventions     No data to display

## 2023-11-10 NOTE — Progress Notes (Signed)
 PROGRESS NOTE  Stacy Kirk    DOB: 1945/06/09, 78 y.o.  FMW:[id number]    Code Status: Limited: Do not attempt resuscitation (DNR) -DNR-LIMITED -Do Not Intubate/DNI    DOA: 11/07/2023   LOS: 2   Brief hospital course  Stacy Kirk is a 78 y.o. female with a PMH significant for heart failure preserved EF, PE on Eliquis , history of ovarian cancer, hypothyroidism, obesity, left hip fracture in June s/p surgical repair, CKD IIIa, COPD with recent hospitalization for exacerbation, left heel ulcer, diabetes mellitus 2, suspected baseline dementia, recent UTI and recurrent urinary retention prompting discharge with Foley catheter in place and recommendation for outpatient follow-up with urology. Presented to ED with lower abdominal pain. Per report from husband- she was constipated with several days without BM. She is also more confused than baseline and not able to stand or walk as usual. Of note, suffered from hip fracture 08/2023. ED course: hemodynamically stable, O2 sat 93% and CBG was 169. She was placed on oxygen to maintain sats greater than 95%.  Labs revealed hyponatremia, hypokalemia, stable renal function, mildly elevated anion gap with normal glucose, lipase 58, leukocytosis with differential not obtained.  Urinalysis was abnormal with turbid appearance, large hemoglobin, large leukocytes, many bacteria, greater than 50 WBCs, urine culture was obtained.  Foley catheter was changed out by the ED staff.  CT abdomen and pelvis unremarkable except for mild fecal impaction.  She was started on antibiotics for UTI as well as suppository for constipation.   11/10/23 -mental status appears to be at baseline. PT/OT recs are for SNF and patient is agreeable. She is medically clear to dc when bed available.   Assessment & Plan  Principal Problem:   Complicated urinary tract infection Active Problems:   Sepsis due to urinary tract infection (HCC)   AMS (altered mental status)  Recurrent  UTI- indwelling foley since last admission due to acute urinary retention. Had urology f/u scheduled next week but husband cancelled once admitted to hospital.  - continue foley, consider voiding trial while inpatient vs outpatient follow up as originally planned.  - UxCx returned and partial sensitivities. Antibiotics adjusted per pharm recs.  - continue PT/OT. TOC consulted for SNF   Acute metabolic encephalopathy and likely underlying dementia- appears back to baseline today. As attributed by infection, pain, and medication side effects on worsening dementia baseline.  - supportive care - PT/OT   History of PE on Eliquis - On 3L without consolidation on xray. Husband states she is adherent with medication at home so less likely recurrent clot burden contributing.  - continue home eliquis .  - wean O2 as tolerated   Diabetes mellitus 2 Not on medications at home Follow CBGs and provide SSI   COPD During previous admission had decompensated to the point of requiring BiPAP Not on maintenance medications at home Continue O2 as needed DuoNebs as needed - wean O2 as tolerated   HFpEF For now hold Toprol  XL, Aldactone  and Demadex  Daily weights and follow intake and output - titrating back on today. Had hypotension on day of admission but appears to have recovered.  - restart metoprolol     Hypothyroidism - continue home Synthroid     Left hip fracture in June PT consultation   Left heel ulcer On Santyl  with basic wound care Wound care nurse consultation for additional recommendation Evaluated by podiatry during previous admission   Rectal constipation- s/p one suppository and a BM this admission  Body mass index is 33.1 kg/m.  VTE ppx: home eliquis    Diet:     Diet   Diet regular Room service appropriate? Yes; Fluid consistency: Thin   Consultants: None   Subjective 11/10/23    Pt reports doing well today. She is reluctant to go to SNF.    Objective  Blood  pressure (!) 128/97, pulse 79, temperature 99 F (37.2 C), temperature source Oral, resp. rate 19, height 5' 2 (1.575 m), weight 84.4 kg, SpO2 100%.  Intake/Output Summary (Last 24 hours) at 11/10/2023 0751 Last data filed at 11/10/2023 0636 Gross per 24 hour  Intake 103 ml  Output 600 ml  Net -497 ml   Filed Weights   11/08/23 0732 11/09/23 1051 11/10/23 0500  Weight: 84.4 kg 83.5 kg 82.1 kg    Physical Exam:  General: awake, alert, NAD HEENT: atraumatic, clear conjunctiva, anicteric sclera, MMM, hearing grossly normal Respiratory: normal respiratory effort. Cardiovascular: quick capillary refill Nervous: A&O x3. no gross focal neurologic deficits, normal speech Extremities: moves all equally, no edema, normal tone Skin: dry, intact, normal temperature, normal color. No rashes, lesions or ulcers on exposed skin Psychiatry: normal mood, congruent affect  Labs   I have personally reviewed the following labs and imaging studies CBC    Component Value Date/Time   WBC 5.1 11/10/2023 0535   RBC 3.73 (L) 11/10/2023 0535   HGB 10.7 (L) 11/10/2023 0535   HCT 33.1 (L) 11/10/2023 0535   PLT 224 11/10/2023 0535   MCV 88.7 11/10/2023 0535   MCH 28.7 11/10/2023 0535   MCHC 32.3 11/10/2023 0535   RDW 17.5 (H) 11/10/2023 0535   LYMPHSABS 2.5 09/08/2023 2329   MONOABS 0.9 09/08/2023 2329   EOSABS 0.4 09/08/2023 2329   BASOSABS 0.0 09/08/2023 2329      Latest Ref Rng & Units 11/10/2023    5:35 AM 11/09/2023    3:18 AM 11/08/2023    1:19 PM  BMP  Glucose 70 - 99 mg/dL 86  884  91   BUN 8 - 23 mg/dL 38  41  37   Creatinine 0.44 - 1.00 mg/dL 8.49  8.35  8.58   Sodium 135 - 145 mmol/L 134  136  136   Potassium 3.5 - 5.1 mmol/L 3.4  4.1  4.3   Chloride 98 - 111 mmol/L 96  95  90   CO2 22 - 32 mmol/L 28  28  32   Calcium 8.9 - 10.3 mg/dL 9.1  8.9  9.0     DG Chest 2 View Result Date: 11/08/2023 CLINICAL DATA:  Lower abdominal pain and altered mental status. EXAM: CHEST - 2 VIEW  COMPARISON:  October 21, 2023 FINDINGS: The cardiac silhouette is enlarged and unchanged in size. There is marked severity calcification of the aortic arch. Low lung volumes are noted. Mild left infrahilar scarring and/or atelectasis is seen. No pleural effusion or pneumothorax is identified. Multilevel degenerative changes seen throughout the thoracic spine. IMPRESSION: Low lung volumes with mild left infrahilar scarring and/or atelectasis. Electronically Signed   By: Suzen Dials M.D.   On: 11/08/2023 19:28   CT HEAD WO CONTRAST ( ) Result Date: 11/08/2023 CLINICAL DATA:  Altered mental status. EXAM: CT HEAD WITHOUT CONTRAST TECHNIQUE: Contiguous axial images were obtained from the base of the skull through the vertex without intravenous contrast. RADIATION DOSE REDUCTION: This exam was performed according to the departmental dose-optimization program which includes automated exposure control, adjustment of the mA and/or kV according to patient size and/or use of iterative reconstruction  technique. COMPARISON:  October 19, 2023 FINDINGS: Brain: There is generalized cerebral atrophy with widening of the extra-axial spaces and ventricular dilatation. There are areas of decreased attenuation within the white matter tracts of the supratentorial brain, consistent with microvascular disease changes. A small, chronic right basal ganglia lacunar infarct is seen. Vascular: Mild bilateral cavernous carotid artery calcification is noted. Skull: Normal. Negative for fracture or focal lesion. Sinuses/Orbits: No acute finding. Other: None. IMPRESSION: 1. Generalized cerebral atrophy and microvascular disease changes of the supratentorial brain. 2. Small, chronic right basal ganglia lacunar infarct. 3. No acute intracranial abnormality. Electronically Signed   By: Suzen Dials M.D.   On: 11/08/2023 19:26    Disposition Plan & Communication  Patient status: Inpatient  Admitted From: Home Planned disposition  location: Skilled nursing facility Anticipated discharge date: 8/16 pending SNF workup   Family Communication: husband on phone    Author: Marien LITTIE Piety, DO [facility] 11/10/2023, 7:51 AM   Available by Epic secure chat 7AM-7PM. If 7PM-7AM, please contact night-coverage.  [facility] contact information found on [email or web address].

## 2023-11-10 NOTE — Plan of Care (Signed)
  Problem: Coping: Goal: Ability to adjust to condition or change in health will improve Outcome: Progressing   Problem: Fluid Volume: Goal: Ability to maintain a balanced intake and output will improve Outcome: Progressing   Problem: Health Behavior/Discharge Planning: Goal: Ability to identify and utilize available resources and services will improve Outcome: Progressing   Problem: Metabolic: Goal: Ability to maintain appropriate glucose levels will improve Outcome: Progressing   Problem: Clinical Measurements: Goal: Ability to maintain clinical measurements within normal limits will improve Outcome: Progressing

## 2023-11-10 NOTE — Care Management Important Message (Signed)
 Important Message  Patient Details  Name: Stacy Kirk MRN: [id number] Date of Birth: 1945/10/12   Important Message Given:  Yes - Medicare IM     Sharna Gabrys W, CMA 11/10/2023, 1:04 PM

## 2023-11-11 DIAGNOSIS — N390 Urinary tract infection, site not specified: Secondary | ICD-10-CM | POA: Diagnosis not present

## 2023-11-11 LAB — URINE CULTURE: Culture: >=100000 — AB

## 2023-11-11 LAB — GLUCOSE, CAPILLARY
Glucose-Capillary: 108 mg/dL — ABNORMAL HIGH (ref 70–99)
Glucose-Capillary: 102 mg/dL — ABNORMAL HIGH (ref 70–99)

## 2023-11-11 MED ORDER — AMPICILLIN 500 MG PO CAPS
500.0000 mg | ORAL_CAPSULE | Freq: Two times a day (BID) | ORAL | Status: AC
  Administered 2023-11-11 – 2023-11-17 (×14): 500 mg via ORAL
  Filled 2023-11-11 (×14): qty 1

## 2023-11-11 MED ORDER — SPIRONOLACTONE 25 MG PO TABS
25.0000 mg | ORAL_TABLET | Freq: Every day | ORAL | Status: DC
  Administered 2023-11-11 – 2023-11-22 (×11): 25 mg via ORAL
  Filled 2023-11-11 (×13): qty 1

## 2023-11-11 NOTE — Progress Notes (Signed)
 PROGRESS NOTE [name] Surgeon    DOB: March 10, 1946, 78 y.o.  FMW:[id number]    Code Status: Limited: Do not attempt resuscitation (DNR) -DNR-LIMITED -Do Not Intubate/DNI    DOA: 11/07/2023   LOS: 3  Brief hospital course  Stacy Kirk is a 78 y.o. female with a PMH significant for heart failure preserved EF, PE on Eliquis , history of ovarian cancer, hypothyroidism, obesity, left hip fracture in June s/p surgical repair, CKD IIIa, COPD with recent hospitalization for exacerbation, left heel ulcer, diabetes mellitus 2, suspected baseline dementia, recent UTI and recurrent urinary retention prompting discharge with Foley catheter in place and recommendation for outpatient follow-up with urology. Of note, suffered from hip fracture 08/2023. Presented to ED with lower abdominal pain.  ED course: hemodynamically stable, O2 sat 93% and CBG was 169. She was placed on oxygen to maintain sats greater than 95%.  Labs revealed hyponatremia, hypokalemia, stable renal function, lipase 58, leukocytosis. UA: turbid appearance, large hgb, large leukocytes, many bacteria,>50 WBCs. Foley replaced.  Urine culture was obtained- now positive for pseudomonas and e faecalis.   CT abdomen and pelvis unremarkable except for mild fecal impaction.  She was started on antibiotics for UTI as well as suppository for constipation.   11/11/23 -mental status appears to be at baseline. PT/OT recs are for SNF and patient is agreeable. She is medically clear to dc when bed available.   Assessment & Plan  Principal Problem:   Complicated urinary tract infection Active Problems:   Sepsis due to urinary tract infection (HCC)   AMS (altered mental status)  Recurrent UTI- indwelling foley since last admission due to acute urinary retention. Had urology f/u scheduled next week but husband cancelled once admitted to hospital.  - continue foley, consider voiding trial while inpatient vs outpatient follow up as originally planned.   - UxCx returned. Antibiotics adjusted per pharm recs. Continue ciprofloxacin  for 7 day course - continue PT/OT. TOC consulted for SNF   Acute metabolic encephalopathy and likely underlying dementia- appears back to baseline. As attributed by infection, pain, and medication side effects on worsening dementia baseline.  - supportive care - PT/OT   History of PE on Eliquis - On 3L without consolidation on xray. Husband states she is adherent with medication at home so less likely recurrent clot burden contributing. She has weaned to room air. - continue home eliquis .    Diabetes mellitus 2 Not on medications at home. Diet controlled. Dc sSSI today   COPD During previous admission had decompensated to the point of requiring BiPAP Not on maintenance medications at home DuoNebs as needed   HFpEF For now hold Toprol  XL, Aldactone  and Demadex  Daily weights and follow intake and output - titrating back on today. Had hypotension on day of admission but appears to have recovered.  - restart metoprolol   - starting back spironolactone     Hypothyroidism - continue home Synthroid     Left hip fracture in June PT consultation   Left heel ulcer On Santyl  with basic wound care Wound care nurse consultation for additional recommendation Evaluated by podiatry during previous admission   Rectal constipation- s/p one suppository and a BM this admission  Body mass index is 33.06 kg/m.  VTE ppx: home eliquis    Diet:     Diet   Diet regular Room service appropriate? Yes; Fluid consistency: Thin   Consultants: None   Subjective 11/11/23    Pt reports doing well today. She is reluctant to go to SNF.  Objective  Blood pressure (!) 128/97, pulse 79, temperature 99 F (37.2 C), temperature source Oral, resp. rate 19, height 5' 2 (1.575 m), weight 84.4 kg, SpO2 100%.  Intake/Output Summary (Last 24 hours) at 11/11/2023 0742 Last data filed at 11/11/2023 0630 Gross per 24 hour  Intake  223 ml  Output 1525 ml  Net -1302 ml   Filed Weights   11/09/23 1051 11/10/23 0500 11/11/23 0500  Weight: 83.5 kg 82.1 kg 82 kg    Physical Exam:  General: awake, alert, NAD HEENT: atraumatic, clear conjunctiva, anicteric sclera, MMM, hearing grossly normal Respiratory: normal respiratory effort. Cardiovascular: quick capillary refill Nervous: A&O x3. no gross focal neurologic deficits, normal speech Extremities: moves all equally, no edema, normal tone Skin: dry, intact, normal temperature, normal color. No rashes, lesions or ulcers on exposed skin Psychiatry: normal mood, congruent affect  Labs   I have personally reviewed the following labs and imaging studies CBC    Component Value Date/Time   WBC 5.1 11/10/2023 0535   RBC 3.73 (L) 11/10/2023 0535   HGB 10.7 (L) 11/10/2023 0535   HCT 33.1 (L) 11/10/2023 0535   PLT 224 11/10/2023 0535   MCV 88.7 11/10/2023 0535   MCH 28.7 11/10/2023 0535   MCHC 32.3 11/10/2023 0535   RDW 17.5 (H) 11/10/2023 0535   LYMPHSABS 2.5 09/08/2023 2329   MONOABS 0.9 09/08/2023 2329   EOSABS 0.4 09/08/2023 2329   BASOSABS 0.0 09/08/2023 2329      Latest Ref Rng & Units 11/10/2023    5:35 AM 11/09/2023    3:18 AM 11/08/2023    1:19 PM  BMP  Glucose 70 - 99 mg/dL 86  884  91   BUN 8 - 23 mg/dL 38  41  37   Creatinine 0.44 - 1.00 mg/dL 8.49  8.35  8.58   Sodium 135 - 145 mmol/L 134  136  136   Potassium 3.5 - 5.1 mmol/L 3.4  4.1  4.3   Chloride 98 - 111 mmol/L 96  95  90   CO2 22 - 32 mmol/L 28  28  32   Calcium 8.9 - 10.3 mg/dL 9.1  8.9  9.0    No results found.  Disposition Plan & Communication  Patient status: Inpatient  Admitted From: Home Planned disposition location: Skilled nursing facility Anticipated discharge date: 8/16 pending SNF workup   Family Communication: husband on phone    Author: Marien LITTIE Piety, DO [facility] 11/11/2023, 7:42 AM   Available by Epic secure chat 7AM-7PM. If 7PM-7AM, please contact  night-coverage.  [facility] contact information found on [email or web address].

## 2023-11-11 NOTE — Plan of Care (Signed)
 Pt alert; vital stable; no significant changes through the night. Problem: Coping: Goal: Ability to adjust to condition or change in health will improve Outcome: Progressing   Problem: Tissue Perfusion: Goal: Adequacy of tissue perfusion will improve Outcome: Progressing   Problem: Clinical Measurements: Goal: Ability to maintain clinical measurements within normal limits will improve Outcome: Progressing   Problem: Elimination: Goal: Will not experience complications related to bowel motility Outcome: Progressing   Problem: Pain Managment: Goal: General experience of comfort will improve and/or be controlled Outcome: Progressing   Problem: Safety: Goal: Ability to remain free from injury will improve Outcome: Progressing

## 2023-11-12 DIAGNOSIS — N390 Urinary tract infection, site not specified: Secondary | ICD-10-CM | POA: Diagnosis not present

## 2023-11-12 LAB — GLUCOSE, CAPILLARY
Glucose-Capillary: 97 mg/dL (ref 70–99)
Glucose-Capillary: 103 mg/dL — ABNORMAL HIGH (ref 70–99)
Glucose-Capillary: 144 mg/dL — ABNORMAL HIGH (ref 70–99)
Glucose-Capillary: 136 mg/dL — ABNORMAL HIGH (ref 70–99)

## 2023-11-12 MED ORDER — POLYVINYL ALCOHOL 1.4 % OP SOLN
1.0000 [drp] | OPHTHALMIC | Status: DC | PRN
  Administered 2023-11-12 – 2023-11-18 (×7): 1 [drp] via OPHTHALMIC
  Filled 2023-11-12: qty 15

## 2023-11-12 NOTE — Progress Notes (Signed)
 PROGRESS NOTE Stacy Kirk    DOB: 09/11/1945, 78 y.o.  FMW:[id number]    Code Status: Limited: Do not attempt resuscitation (DNR) -DNR-LIMITED -Do Not Intubate/DNI    DOA: 11/07/2023   LOS: 4  Brief hospital course  CLARRISA KAYLOR is a 78 y.o. female with a PMH significant for heart failure preserved EF, PE on Eliquis , history of ovarian cancer, hypothyroidism, obesity, left hip fracture in June s/p surgical repair, CKD IIIa, COPD with recent hospitalization for exacerbation, left heel ulcer, diabetes mellitus 2, suspected baseline dementia, recent UTI and recurrent urinary retention prompting discharge with Foley catheter in place and recommendation for outpatient follow-up with urology. Of note, suffered from hip fracture 08/2023. Presented to ED with lower abdominal pain.  ED course: hemodynamically stable, O2 sat 93% and CBG was 169. She was placed on oxygen to maintain sats greater than 95%.  Labs revealed hyponatremia, hypokalemia, stable renal function, lipase 58, leukocytosis. UA: turbid appearance, large hgb, large leukocytes, many bacteria,>50 WBCs. Foley replaced.  Urine culture was obtained- now positive for pseudomonas and e faecalis.   CT abdomen and pelvis unremarkable except for mild fecal impaction.  She was started on antibiotics for UTI as well as suppository for constipation.   11/12/23 -mental status appears to be at baseline. PT/OT recs are for SNF and patient is agreeable. She is medically clear to dc when bed available.   Assessment & Plan  Principal Problem:   Complicated urinary tract infection Active Problems:   Sepsis due to urinary tract infection (HCC)   AMS (altered mental status)  Recurrent UTI- indwelling foley since last admission due to acute urinary retention. Had urology f/u scheduled next week but husband cancelled once admitted to hospital.  - continue foley, consider voiding trial while inpatient vs outpatient follow up as originally planned.   - UxCx returned. Antibiotics adjusted per pharm recs. Continue ciprofloxacin  + ampicillin  for 7 day course - continue PT/OT. TOC consulted for SNF   Acute metabolic encephalopathy and likely underlying dementia- appears back to baseline. As attributed by infection, pain, and medication side effects on worsening dementia baseline.  - supportive care - PT/OT   History of PE on Eliquis - On 3L on admission without consolidation on xray. Husband states she is adherent with medication at home so less likely recurrent clot burden contributing. She has weaned to room air. - continue home eliquis .    Diabetes mellitus 2 Not on medications at home. Diet controlled. Dc sSSI   COPD During previous admission had decompensated to the point of requiring BiPAP Not on maintenance medications at home DuoNebs as needed   HFpEF For now hold Toprol  XL, Aldactone  and Demadex  Daily weights and follow intake and output - titrating back on today. Had hypotension on day of admission but appears to have recovered.  - restart metoprolol   - starting back spironolactone     Hypothyroidism - continue home Synthroid     Left hip fracture in June PT consultation   Left heel ulcer On Santyl  with basic wound care Wound care nurse consultation for additional recommendation Evaluated by podiatry during previous admission   Rectal constipation- s/p one suppository and a BM this admission  Body mass index is 32.78 kg/m.  VTE ppx: home eliquis    Diet:     Diet   Diet regular Room service appropriate? Yes; Fluid consistency: Thin   Consultants: None   Subjective 11/12/23    Pt reports doing well today. No concerns expressed.    Objective  Blood pressure (!) 128/97, pulse 79, temperature 99 F (37.2 C), temperature source Oral, resp. rate 19, height 5' 2 (1.575 m), weight 84.4 kg, SpO2 100%.  Intake/Output Summary (Last 24 hours) at 11/12/2023 0732 Last data filed at 11/12/2023 0600 Gross per 24  hour  Intake 993 ml  Output 600 ml  Net 393 ml   Filed Weights   11/10/23 0500 11/11/23 0500 11/12/23 0421  Weight: 82.1 kg 82 kg 81.3 kg    Physical Exam:  General: awake, alert, NAD HEENT: atraumatic, clear conjunctiva, anicteric sclera, MMM, hearing grossly normal Respiratory: normal respiratory effort. Cardiovascular: quick capillary refill Nervous: A&O x3. no gross focal neurologic deficits, normal speech Extremities: moves all equally, no edema, normal tone Skin: dry, intact, normal temperature, normal color. No rashes, lesions or ulcers on exposed skin Psychiatry: normal mood, congruent affect  Labs   I have personally reviewed the following labs and imaging studies CBC    Component Value Date/Time   WBC 5.1 11/10/2023 0535   RBC 3.73 (L) 11/10/2023 0535   HGB 10.7 (L) 11/10/2023 0535   HCT 33.1 (L) 11/10/2023 0535   PLT 224 11/10/2023 0535   MCV 88.7 11/10/2023 0535   MCH 28.7 11/10/2023 0535   MCHC 32.3 11/10/2023 0535   RDW 17.5 (H) 11/10/2023 0535   LYMPHSABS 2.5 09/08/2023 2329   MONOABS 0.9 09/08/2023 2329   EOSABS 0.4 09/08/2023 2329   BASOSABS 0.0 09/08/2023 2329      Latest Ref Rng & Units 11/10/2023    5:35 AM 11/09/2023    3:18 AM 11/08/2023    1:19 PM  BMP  Glucose 70 - 99 mg/dL 86  884  91   BUN 8 - 23 mg/dL 38  41  37   Creatinine 0.44 - 1.00 mg/dL 8.49  8.35  8.58   Sodium 135 - 145 mmol/L 134  136  136   Potassium 3.5 - 5.1 mmol/L 3.4  4.1  4.3   Chloride 98 - 111 mmol/L 96  95  90   CO2 22 - 32 mmol/L 28  28  32   Calcium 8.9 - 10.3 mg/dL 9.1  8.9  9.0    No results found.  Disposition Plan & Communication  Patient status: Inpatient  Admitted From: Home Planned disposition location: Skilled nursing facility Anticipated discharge date: 8/18 pending SNF workup   Family Communication: husband on phone    Author: Marien LITTIE Piety, DO [facility] 11/12/2023, 7:32 AM   Available by Epic secure chat 7AM-7PM. If 7PM-7AM, please  contact night-coverage.  [facility] contact information found on [email or web address].

## 2023-11-12 NOTE — Plan of Care (Signed)
  Problem: Coping: Goal: Ability to adjust to condition or change in health will improve Outcome: Progressing   Problem: Metabolic: Goal: Ability to maintain appropriate glucose levels will improve Outcome: Progressing   Problem: Nutritional: Goal: Maintenance of adequate nutrition will improve Outcome: Progressing   Problem: Tissue Perfusion: Goal: Adequacy of tissue perfusion will improve Outcome: Progressing   Problem: Clinical Measurements: Goal: Ability to maintain clinical measurements within normal limits will improve Outcome: Progressing

## 2023-11-12 NOTE — Progress Notes (Signed)
 Physical Therapy Treatment Patient Details Name: DAKIA SCHIFANO MRN: [id number] DOB: [birth year] Today's Date: 11/12/2023   History of Present Illness KATRESE SHELL is a 78 y.o. female  Past medical history of diastolic CHF, PE on Eliquis , hypertension, diabetes, hypothyroid, GI bleeding, CKD, lymphedema, ovarian cancer, lumbar spinal stenosis, left hip fracture surgery in June, recent hospitalization for AMS/shortness of breath found to be in COPD exacerbation, urinary tract infection and urinary retention with indwelling Foley catheter placement, acute metabolic encephalopathy, chronic foot ulcer, presents to our department tonight for lower abdominal pain and pressure that started tonight.    PT Comments  Pt in bed.  Resistant to any mobility skills.  Pt screams out with attempt to get pt to EOB and yells with adjustments to [facility] positioning.  She stated she is generally uncomfortable from immobility but despite education and encouragement she resists all attempts at bed mobility and sitting.  She does allow limited RLE AAROM.  LLE ROM is attempted but she screams out with any attempts.  Don't make me cry.  Of note, she does not yell out with replacing pillow under LLE despite increased ROM and motion while placing pillow.  She is again educated on risks/benefits of immobility.  Given ice water per request.     If plan is discharge home, recommend the following: Two people to help with walking and/or transfers;Two people to help with bathing/dressing/bathroom;Help with stairs or ramp for entrance;Assist for transportation;Assistance with cooking/housework   Can travel by private vehicle        Equipment Recommendations       Recommendations for Other Services       Precautions / Restrictions Precautions Precautions: Fall Recall of Precautions/Restrictions: Impaired Restrictions Weight Bearing Restrictions Per Provider Order: No Other Position/Activity Restrictions: Prevlon boot  donned on LLE for heel wound     Mobility  Bed Mobility               General bed mobility comments: refuses    Transfers                        Ambulation/Gait                   Stairs             Wheelchair Mobility     Tilt Bed    Modified Rankin (Stroke Patients Only)       Balance                                            Communication Communication Communication: No apparent difficulties  Cognition Arousal: Alert Behavior During Therapy: WFL for tasks assessed/performed   PT - Cognitive impairments: No apparent impairments                         Following commands: Intact Following commands impaired: Follows one step commands with increased time    Cueing Cueing Techniques: Verbal cues, Tactile cues  Exercises Other Exercises Other Exercises: RLE AAROM, refused LLE ROM and screams with ally attempt    General Comments        Pertinent Vitals/Pain Pain Assessment Pain Assessment: Faces Faces Pain Scale: Hurts even more Pain Location: LLE with movement Pain Descriptors / Indicators: Discomfort Pain Intervention(s): Limited activity within patient's tolerance, Monitored during session,  Repositioned    Home Living                          Prior Function            PT Goals (current goals can now be found in the care plan section) Progress towards PT goals: Not progressing toward goals - comment    Frequency    Min 2X/week      PT Plan      Co-evaluation              AM-PAC PT 6 Clicks Mobility   Outcome Measure  Help needed turning from your back to your side while in a flat bed without using bedrails?: A Lot Help needed moving from lying on your back to sitting on the side of a flat bed without using bedrails?: Total Help needed moving to and from a bed to a chair (including a wheelchair)?: Total Help needed standing up from a chair using your arms  (e.g., wheelchair or bedside chair)?: Total Help needed to walk in hospital room?: Total Help needed climbing 3-5 steps with a railing? : Total 6 Click Score: 7    End of Session   Activity Tolerance: Patient limited by pain Patient left: in bed;with call bell/phone within reach;with bed alarm set Nurse Communication: Mobility status PT Visit Diagnosis: Other abnormalities of gait and mobility (R26.89);Muscle weakness (generalized) (M62.81);History of falling (Z91.81);Pain Pain - Right/Left: Left Pain - part of body: Hip;Knee;Ankle and joints of foot     Time: 9169-9155 PT Time Calculation (min) (ACUTE ONLY): 14 min  Charges:    $Therapeutic Exercise: 8-22 mins PT General Charges $$ ACUTE PT VISIT: 1 Visit                   Lauraine Gills, PTA 11/12/23, 9:37 AM

## 2023-11-12 NOTE — Progress Notes (Signed)
       CROSS COVER NOTE  NAME: Stacy Kirk MRN: [id number] DOB : 03-05-1946    Concern as stated by nurse / staff   patient complaining of burning pain on both her eyes, periorbital redness on her eyes noted. standing order for eyedrops and tylenol  po given and still no improvement      Pertinent findings on chart review: Patient hospitalized for the past week for recurrent UTI with acute metabolic encephalopathy.  Currently on oral antibiotics.  Awaiting SNF    Patient assessment Patient evaluated at bedside.  Has burning pain bilateral eyes and photophobia.  She denies changes in her visual acuity.  Redness on lid edges of uncertain chronicity.  Received lubricating eyedrops earlier without improvement      11/12/2023    8:06 PM 11/12/2023    4:45 PM 11/12/2023    8:19 AM  Vitals with BMI  Systolic 139 113 858  Diastolic 79 82 90  Pulse 97  83   Physical Exam Vitals and nursing note reviewed.  Constitutional:      General: She is not in acute distress.    Comments: Patient bothered by light when turned on to examine eyes  HENT:     Head: Normocephalic and atraumatic.  Eyes:     Comments: Redness on lid edges, slight conjunctival injection noted. See picture below  Cardiovascular:     Rate and Rhythm: Normal rate and regular rhythm.     Heart sounds: Normal heart sounds.  Pulmonary:     Effort: Pulmonary effort is normal.     Breath sounds: Normal breath sounds.  Abdominal:     Palpations: Abdomen is soft.     Tenderness: There is no abdominal tenderness.  Neurological:     Mental Status: Mental status is at baseline.        Assessment and  Interventions   Assessment:  Conjunctivitis, possibly allergic, not improved with lubricant drops ?  Blepharitis  Plan: Trial of ketotifen  eyedrops Continue lubricant eyedrops Can consider ophthalmology consult in the a.m. if no improvement X      CRITICAL CARE Performed by: Delayne LULLA Solian   Total  critical care time: 35 minutes  Critical care time was exclusive of separately billable procedures and treating other patients.  Critical care was necessary to treat or prevent imminent or life-threatening deterioration.  Critical care was time spent personally by me on the following activities: development of treatment plan with patient and/or surrogate as well as nursing, discussions with consultants, evaluation of patient's response to treatment, examination of patient, obtaining history from patient or surrogate, ordering and performing treatments and interventions, ordering and review of laboratory studies, ordering and review of radiographic studies, pulse oximetry and re-evaluation of patient's condition.

## 2023-11-13 ENCOUNTER — Telehealth

## 2023-11-13 DIAGNOSIS — N390 Urinary tract infection, site not specified: Secondary | ICD-10-CM | POA: Diagnosis not present

## 2023-11-13 LAB — CULTURE, BLOOD (ROUTINE X 2)
Culture: NO GROWTH
Culture: NO GROWTH
Special Requests: ADEQUATE

## 2023-11-13 LAB — GLUCOSE, CAPILLARY
Glucose-Capillary: 116 mg/dL — ABNORMAL HIGH (ref 70–99)
Glucose-Capillary: 103 mg/dL — ABNORMAL HIGH (ref 70–99)
Glucose-Capillary: 104 mg/dL — ABNORMAL HIGH (ref 70–99)
Glucose-Capillary: 96 mg/dL (ref 70–99)
Glucose-Capillary: 134 mg/dL — ABNORMAL HIGH (ref 70–99)

## 2023-11-13 MED ORDER — OXYCODONE-ACETAMINOPHEN 5-325 MG PO TABS
1.0000 | ORAL_TABLET | Freq: Three times a day (TID) | ORAL | Status: DC | PRN
  Administered 2023-11-14 – 2023-11-22 (×4): 1 via ORAL
  Filled 2023-11-13 (×5): qty 1

## 2023-11-13 MED ORDER — ERYTHROMYCIN 5 MG/GM OP OINT
TOPICAL_OINTMENT | Freq: Four times a day (QID) | OPHTHALMIC | Status: AC
  Administered 2023-11-13 – 2023-11-16 (×10): 1 via OPHTHALMIC
  Filled 2023-11-13: qty 1

## 2023-11-13 MED ORDER — KETOTIFEN FUMARATE 0.035 % OP SOLN: 1.0000 [drp] | Freq: Two times a day (BID) | OPHTHALMIC | Status: DC

## 2023-11-13 MED ORDER — KETOTIFEN FUMARATE 0.035 % OP SOLN
1.0000 [drp] | Freq: Two times a day (BID) | OPHTHALMIC | Status: DC
  Administered 2023-11-13 – 2023-11-23 (×18): 1 [drp] via OPHTHALMIC
  Filled 2023-11-13: qty 5

## 2023-11-13 NOTE — Progress Notes (Signed)
 PT Cancellation Note  Patient Details Name: Stacy Kirk MRN: [id number] DOB: 1945/05/24   Cancelled Treatment:    Reason Eval/Treat Not Completed: Other (comment)  Attempted to coordinate pain med with nursing for improved outcomes.  Pt continues to have red itchy eyes that are a contributing barrier to progress.  Attempted LE ex but she is generally irritated and keeps rubbing her eyes you have something in the air here that is making them itchy.  Encouragement given but requests for BLE AA/PROM are resisted.   Repositioned in bed per her request.  Will return at a later time/date.   Lauraine Gills 11/13/2023, 11:48 AM

## 2023-11-13 NOTE — TOC Progression Note (Addendum)
 Transition of Care [facility]) - Progression Note    Patient Details  Name: [name] MRN: [id number] Date of Birth: Apr 04, 1945  Transition of Care [facility]) CM/SW Contact  [place] Davit [place], [place] Phone Number: 11/13/2023, 3:31 PM  Clinical Narrative:     CSW spoke with Mr. Gilmartin, spouse. He inquired about patient's placement. CSW advised that [facility] offered a bed. CSW advised that $2800 needs to be paid before patient can be transferred. Mr. Carrol advised that he spoke to someone who stated that he had to pay $1100. Mr. Hobbs advised that he will get in touch with the facility and he will pay.   Mr. Roan advised that patient is upset with him but she needs to understand that he cannot manage her needs while he is caring for his adult son with cerebral palsy. He advised that he felt guilty. CSW encouraged Mr. Rounsaville and advised him that he has to think of safety first and that will help with his feelings of guilt.    Expected Discharge Plan: Skilled Nursing Facility Barriers to Discharge: Continued Medical Work up, SNF Pending bed offer, English as a second language teacher               Expected Discharge Plan and Services   Discharge Planning Services: CM Consult   Living arrangements for the past 2 months: Single Family Home                                       Social Drivers of Health (SDOH) Interventions SDOH Screenings   Food Insecurity: No Food Insecurity (11/08/2023)  Recent Concern: Food Insecurity - Food Insecurity Present (09/27/2023)   Received [facility]  Housing: Low Risk  (11/08/2023)  Transportation Needs: No Transportation Needs (11/08/2023)  Utilities: Not At Risk (11/08/2023)  Financial Resource Strain: Medium Risk (09/27/2023)   Received from [facility]  Social Connections: Unknown (11/08/2023)  Tobacco Use: Medium Risk (11/07/2023)    Readmission Risk Interventions     No data to display

## 2023-11-13 NOTE — Progress Notes (Signed)
 PROGRESS NOTE Stacy Kirk    DOB: [birth year], 78 y.o.  FMW:[id number]    Code Status: Limited: Do not attempt resuscitation (DNR) -DNR-LIMITED -Do Not Intubate/DNI    DOA: 11/07/2023   LOS: 5  Brief hospital course  Stacy Kirk is a 78 y.o. female with a PMH significant for heart failure preserved EF, PE on Eliquis , history of ovarian cancer, hypothyroidism, obesity, left hip fracture in June s/p surgical repair, CKD IIIa, COPD with recent hospitalization for exacerbation, left heel ulcer, diabetes mellitus 2, suspected baseline dementia, recent UTI and recurrent urinary retention prompting discharge with Foley catheter in place and recommendation for outpatient follow-up with urology. Of note, suffered from hip fracture 08/2023. Presented to ED with lower abdominal pain.  ED course: hemodynamically stable, O2 sat 93% and CBG was 169. She was placed on oxygen to maintain sats greater than 95%.  Labs revealed hyponatremia, hypokalemia, stable renal function, lipase 58, leukocytosis. UA: turbid appearance, large hgb, large leukocytes, many bacteria,>50 WBCs. Foley replaced.  Urine culture was obtained- now positive for pseudomonas and e faecalis.   CT abdomen and pelvis unremarkable except for mild fecal impaction.  She was started on antibiotics for UTI as well as suppository for constipation.   11/13/23 -mental status appears to be at baseline. PT/OT recs are for SNF and patient is agreeable. She is medically clear to dc when bed available.   Assessment & Plan  Principal Problem:   Complicated urinary tract infection Active Problems:   Sepsis due to urinary tract infection (HCC)   AMS (altered mental status)  Recurrent UTI- indwelling foley since last admission due to acute urinary retention. Had urology f/u scheduled next week but husband cancelled once admitted to hospital.  - continue foley, consider voiding trial while inpatient vs outpatient follow up as originally planned.   - UxCx returned. Antibiotics adjusted per pharm recs. Continue ciprofloxacin  + ampicillin  for 7 day course - continue PT/OT. TOC consulted for SNF   Acute metabolic encephalopathy and likely underlying dementia- appears back to baseline. As attributed by infection, pain, and medication side effects on worsening dementia baseline.  - supportive care - PT/OT  Blepharitis- see clinical image for further detail - continue topical antibiotic and eye drops.    History of PE on Eliquis - On 3L on admission without consolidation on xray. Husband states she is adherent with medication at home so less likely recurrent clot burden contributing. She has weaned to room air. - continue home eliquis .    Diabetes mellitus 2 Not on medications at home. Diet controlled. Dc sSSI   COPD During previous admission had decompensated to the point of requiring BiPAP Not on maintenance medications at home DuoNebs as needed   HFpEF For now hold Demadex  Daily weights and follow intake and output - titrating back on today. Had hypotension on day of admission but appears to have recovered.  - restart metoprolol   - starting back spironolactone     Hypothyroidism - continue home Synthroid     Left hip fracture in June PT consultation   Left heel ulcer On Santyl  with basic wound care Wound care nurse consultation for additional recommendation Evaluated by podiatry during previous admission   Rectal constipation- s/p one suppository and a BM this admission  Body mass index is 34.44 kg/m.  VTE ppx: home eliquis    Diet:     Diet   Diet regular Room service appropriate? Yes; Fluid consistency: Thin   Consultants: None   Subjective 11/13/23  Pt reports doing well today. No concerns expressed.    Objective  Blood pressure (!) 128/97, pulse 79, temperature 99 F (37.2 C), temperature source Oral, resp. rate 19, height 5' 2 (1.575 m), weight 84.4 kg, SpO2 100%.  Intake/Output Summary (Last 24  hours) at 11/13/2023 0729 Last data filed at 11/12/2023 1900 Gross per 24 hour  Intake 540 ml  Output --  Net 540 ml   Filed Weights   11/11/23 0500 11/12/23 0421 11/13/23 0431  Weight: 82 kg 81.3 kg 85.4 kg    Physical Exam:  General: awake, alert, NAD HEENT: atraumatic, clear conjunctiva, anicteric sclera, MMM, hearing grossly normal. Erythematous eyelids. Please see clinical images for further details. Respiratory: normal respiratory effort. Cardiovascular: quick capillary refill Nervous: A&O x3. no gross focal neurologic deficits, normal speech Extremities: moves all equally, no edema, normal tone Skin: dry, intact, normal temperature, normal color. No rashes, lesions or ulcers on exposed skin Psychiatry: flat mood, congruent affect  Labs   I have personally reviewed the following labs and imaging studies CBC    Component Value Date/Time   WBC 5.1 11/10/2023 0535   RBC 3.73 (L) 11/10/2023 0535   HGB 10.7 (L) 11/10/2023 0535   HCT 33.1 (L) 11/10/2023 0535   PLT 224 11/10/2023 0535   MCV 88.7 11/10/2023 0535   MCH 28.7 11/10/2023 0535   MCHC 32.3 11/10/2023 0535   RDW 17.5 (H) 11/10/2023 0535   LYMPHSABS 2.5 09/08/2023 2329   MONOABS 0.9 09/08/2023 2329   EOSABS 0.4 09/08/2023 2329   BASOSABS 0.0 09/08/2023 2329      Latest Ref Rng & Units 11/10/2023    5:35 AM 11/09/2023    3:18 AM 11/08/2023    1:19 PM  BMP  Glucose 70 - 99 mg/dL 86  884  91   BUN 8 - 23 mg/dL 38  41  37   Creatinine 0.44 - 1.00 mg/dL 8.49  8.35  8.58   Sodium 135 - 145 mmol/L 134  136  136   Potassium 3.5 - 5.1 mmol/L 3.4  4.1  4.3   Chloride 98 - 111 mmol/L 96  95  90   CO2 22 - 32 mmol/L 28  28  32   Calcium 8.9 - 10.3 mg/dL 9.1  8.9  9.0    No results found.  Disposition Plan & Communication  Patient status: Inpatient  Admitted From: Home Planned disposition location: Skilled nursing facility Anticipated discharge date: 8/19 pending SNF workup   Family Communication: husband on phone     Author: Marien LITTIE Piety, DO [facility] 11/13/2023, 7:29 AM   Available by Epic secure chat 7AM-7PM. If 7PM-7AM, please contact night-coverage.  [facility] contact information found on [email or web address].

## 2023-11-13 NOTE — Plan of Care (Signed)
  Problem: Coping: Goal: Ability to adjust to condition or change in health will improve Outcome: Progressing   Problem: Skin Integrity: Goal: Risk for impaired skin integrity will decrease Outcome: Progressing   Problem: Education: Goal: Knowledge of General Education information will improve Description: Including pain rating scale, medication(s)/side effects and non-pharmacologic comfort measures Outcome: Progressing   Problem: Activity: Goal: Risk for activity intolerance will decrease Outcome: Progressing

## 2023-11-14 DIAGNOSIS — N390 Urinary tract infection, site not specified: Secondary | ICD-10-CM | POA: Diagnosis not present

## 2023-11-14 LAB — GLUCOSE, CAPILLARY
Glucose-Capillary: 94 mg/dL (ref 70–99)
Glucose-Capillary: 104 mg/dL — ABNORMAL HIGH (ref 70–99)
Glucose-Capillary: 117 mg/dL — ABNORMAL HIGH (ref 70–99)

## 2023-11-14 MED ORDER — NAPHAZOLINE-GLYCERIN 0.012-0.25 % OP SOLN
2.0000 [drp] | Freq: Three times a day (TID) | OPHTHALMIC | Status: DC
  Filled 2023-11-14: qty 15

## 2023-11-14 MED ORDER — NAPHAZOLINE-GLYCERIN 0.012-0.25 % OP SOLN
2.0000 [drp] | Freq: Three times a day (TID) | OPHTHALMIC | Status: AC
  Administered 2023-11-14 – 2023-11-17 (×9): 2 [drp] via OPHTHALMIC
  Filled 2023-11-14: qty 15

## 2023-11-14 NOTE — TOC Progression Note (Addendum)
 Transition of Care [facility]) - Progression Note    Patient Details  Name: Stacy Kirk MRN: [id number] Date of Birth: 1945/08/30  [facility] CM/SW Contact  Marinda Cooks, RN Phone Number: 11/14/2023, 10:55 AM  Clinical Narrative:    This CM informed pt's rehab recommendation have been denied . This CM spoke with pt's husband and provided update about denial , at which time he expressed he would ike to more forward with appealing denial. This CM provided Mr. Spiegelman the number to call and start the appeal process at [phone] . This CM also spoke with admission liaison Darrian at [facility] rehab and confirmed  pt's husband paid co pay required if pt's denial is over turned TOC will cont to follow dc planning / care coordination and update as applicable.    Expected Discharge Plan: Skilled Nursing Facility Barriers to Discharge: Continued Medical Work up, SNF Pending bed offer, English as a second language teacher               Expected Discharge Plan and Services   Discharge Planning Services: CM Consult   Living arrangements for the past 2 months: Single Family Home                                       Social Drivers of Health (SDOH) Interventions SDOH Screenings   Food Insecurity: No Food Insecurity (11/08/2023)  Recent Concern: Food Insecurity - Food Insecurity Present (09/27/2023)   Received from [facility]  Housing: Low Risk  (11/08/2023)  Transportation Needs: No Transportation Needs (11/08/2023)  Utilities: Not At Risk (11/08/2023)  Financial Resource Strain: Medium Risk (09/27/2023)   Received from [facility]  Social Connections: Unknown (11/08/2023)  Tobacco Use: Medium Risk (11/07/2023)    Readmission Risk Interventions     No data to display

## 2023-11-14 NOTE — Plan of Care (Signed)
  Problem: Education: Goal: Ability to describe self-care measures that may prevent or decrease complications (Diabetes Survival Skills Education) will improve Outcome: Progressing   Problem: Fluid Volume: Goal: Ability to maintain a balanced intake and output will improve Outcome: Progressing   Problem: Health Behavior/Discharge Planning: Goal: Ability to manage health-related needs will improve Outcome: Progressing   Problem: Nutritional: Goal: Progress toward achieving an optimal weight will improve Outcome: Progressing   Problem: Skin Integrity: Goal: Risk for impaired skin integrity will decrease Outcome: Progressing   Problem: Tissue Perfusion: Goal: Adequacy of tissue perfusion will improve Outcome: Progressing   Problem: Education: Goal: Knowledge of General Education information will improve Description: Including pain rating scale, medication(s)/side effects and non-pharmacologic comfort measures Outcome: Progressing

## 2023-11-14 NOTE — Progress Notes (Signed)
 PROGRESS NOTE Stacy Kirk    DOB: November 13, 1945, 78 y.o.  FMW:[id number]    Code Status: Limited: Do not attempt resuscitation (DNR) -DNR-LIMITED -Do Not Intubate/DNI    DOA: 11/07/2023   LOS: 6  Brief hospital course  Stacy Kirk is a 78 y.o. female with a PMH significant for heart failure preserved EF, PE on Eliquis , history of ovarian cancer, hypothyroidism, obesity, left hip fracture in June s/p surgical repair, CKD IIIa, COPD with recent hospitalization for exacerbation, left heel ulcer, diabetes mellitus 2, suspected baseline dementia, recent UTI and recurrent urinary retention prompting discharge with Foley catheter in place and recommendation for outpatient follow-up with urology. Of note, suffered from hip fracture 08/2023. Presented to ED with lower abdominal pain.  ED course: hemodynamically stable, O2 sat 93% and CBG was 169. She was placed on oxygen to maintain sats greater than 95%.  Labs revealed hyponatremia, hypokalemia, stable renal function, lipase 58, leukocytosis. UA: turbid appearance, large hgb, large leukocytes, many bacteria,>50 WBCs. Foley replaced.  Urine culture was obtained- now positive for pseudomonas and e faecalis.   CT abdomen and pelvis unremarkable except for mild fecal impaction.  She was started on antibiotics for UTI as well as suppository for constipation.   11/14/23 -mental status appears to be at baseline. PT/OT recs are for SNF and patient is agreeable. She is medically clear to dc when bed available. Appears that SNF was denies from insurance and family member is appealing denial.   Assessment & Plan  Principal Problem:   Complicated urinary tract infection Active Problems:   Sepsis due to urinary tract infection (HCC)   AMS (altered mental status)  Recurrent UTI- indwelling foley since last admission due to acute urinary retention. Had urology f/u scheduled next week but husband cancelled once admitted to hospital.  - continue foley,  consider voiding trial while inpatient vs outpatient follow up as originally planned.  - UxCx returned. Antibiotics adjusted per pharm recs. Continue ciprofloxacin  + ampicillin  for 7 day course - continue PT/OT. TOC consulted for SNF   Acute metabolic encephalopathy and likely underlying dementia- appears back to baseline. As attributed by infection, pain, and medication side effects on worsening dementia baseline.  - supportive care - PT/OT  Blepharitis- see clinical image for further detail - continue topical antibiotic and eye drops.    History of PE on Eliquis - On 3L on admission without consolidation on xray. Husband states she is adherent with medication at home so less likely recurrent clot burden contributing. She has weaned to room air. - continue home eliquis .    Diabetes mellitus 2 Not on medications at home. Diet controlled. Dc sSSI   COPD During previous admission had decompensated to the point of requiring BiPAP Not on maintenance medications at home DuoNebs as needed   HFpEF- not acutely exacerbated  - titrating back on today. Had hypotension on day of admission but appears to have recovered. For now hold Demadex  - restart metoprolol   - starting back spironolactone     Hypothyroidism - continue home Synthroid     Left hip fracture in June- at baseline she is ambulatory. Has not returned to prior baseline from fracture, ulcer, and now acute illness has further declined PT consultation   Left heel ulcer On Santyl  with basic wound care Wound care nurse consultation for additional recommendation Evaluated by podiatry during previous admission   Rectal constipation- s/p one suppository and a BM this admission  Body mass index is 32.14 kg/m.  VTE ppx: home eliquis   Diet:     Diet   Diet regular Room service appropriate? Yes; Fluid consistency: Thin   Consultants: None   Subjective 11/14/23    Pt reports that her eyes are still hurting her. No suprapubic  pain.    Objective  Blood pressure (!) 128/97, pulse 79, temperature 99 F (37.2 C), temperature source Oral, resp. rate 19, height 5' 2 (1.575 m), weight 84.4 kg, SpO2 100%.  Intake/Output Summary (Last 24 hours) at 11/14/2023 0731 Last data filed at 11/13/2023 0929 Gross per 24 hour  Intake --  Output 800 ml  Net -800 ml   Filed Weights   11/12/23 0421 11/13/23 0431 11/14/23 0500  Weight: 81.3 kg 85.4 kg 79.7 kg    Physical Exam:  General: awake, alert, NAD HEENT: atraumatic, clear conjunctiva, anicteric sclera, MMM, hearing grossly normal. Erythematous eyelids. Please see clinical images for further details. Respiratory: normal respiratory effort. Cardiovascular: quick capillary refill Nervous: A&O x3. no gross focal neurologic deficits, normal speech Extremities: moves all equally, no edema, normal tone Skin: dry, intact, normal temperature, normal color. No rashes, lesions or ulcers on exposed skin Psychiatry: flat mood, congruent affect  Labs   I have personally reviewed the following labs and imaging studies CBC    Component Value Date/Time   WBC 5.1 11/10/2023 0535   RBC 3.73 (L) 11/10/2023 0535   HGB 10.7 (L) 11/10/2023 0535   HCT 33.1 (L) 11/10/2023 0535   PLT 224 11/10/2023 0535   MCV 88.7 11/10/2023 0535   MCH 28.7 11/10/2023 0535   MCHC 32.3 11/10/2023 0535   RDW 17.5 (H) 11/10/2023 0535   LYMPHSABS 2.5 09/08/2023 2329   MONOABS 0.9 09/08/2023 2329   EOSABS 0.4 09/08/2023 2329   BASOSABS 0.0 09/08/2023 2329      Latest Ref Rng & Units 11/10/2023    5:35 AM 11/09/2023    3:18 AM 11/08/2023    1:19 PM  BMP  Glucose 70 - 99 mg/dL 86  884  91   BUN 8 - 23 mg/dL 38  41  37   Creatinine 0.44 - 1.00 mg/dL 8.49  8.35  8.58   Sodium 135 - 145 mmol/L 134  136  136   Potassium 3.5 - 5.1 mmol/L 3.4  4.1  4.3   Chloride 98 - 111 mmol/L 96  95  90   CO2 22 - 32 mmol/L 28  28  32   Calcium 8.9 - 10.3 mg/dL 9.1  8.9  9.0    No results found.  Disposition Plan &  Communication  Patient status: Inpatient  Admitted From: Home Planned disposition location: Skilled nursing facility Anticipated discharge date: 8/20 pending SNF workup   Family Communication: husband on phone    Author: Marien LITTIE Piety, DO [facility] 11/14/2023, 7:31 AM   Available by Epic secure chat 7AM-7PM. If 7PM-7AM, please contact night-coverage.  [facility] contact information found on [email or web address].

## 2023-11-14 NOTE — Progress Notes (Signed)
 Physical Therapy Treatment Patient Details Name: Stacy Kirk MRN: [id number] DOB: [birth year] Today's Date: 11/14/2023   History of Present Illness Stacy Kirk is a 78 y.o. female  Past medical history of diastolic CHF, PE on Eliquis , hypertension, diabetes, hypothyroid, GI bleeding, CKD, lymphedema, ovarian cancer, lumbar spinal stenosis, left hip fracture surgery in June, recent hospitalization for AMS/shortness of breath found to be in COPD exacerbation, urinary tract infection and urinary retention with indwelling Foley catheter placement, acute metabolic encephalopathy, chronic foot ulcer, presents to our department tonight for lower abdominal pain and pressure that started tonight.    PT Comments  Pt in bed.  Resistant verbally but does not resist session when assisted.  +2 to EOB with mod/max a x 2.  Once sitting she is generally steady in sitting.  Stands x 2 with RW and mod a x 2.  She is unable to stand fully and remains up for about 20 seconds max.  While standing she is inc large loose BM and unable to remain standing for care.  She is assisted back to supine for full linen and bathing care.  Repositioned for comfort.  Pt is premedicated with pain meds before session.  She continues to yell out at times but is inconsistent and does seem somewhat behavioral sometimes.     If plan is discharge home, recommend the following: Two people to help with walking and/or transfers;Two people to help with bathing/dressing/bathroom;Help with stairs or ramp for entrance;Assist for transportation;Assistance with cooking/housework   Can travel by private vehicle        Equipment Recommendations       Recommendations for Other Services       Precautions / Restrictions Precautions Precautions: Fall Recall of Precautions/Restrictions: Impaired Restrictions Weight Bearing Restrictions Per Provider Order: No Other Position/Activity Restrictions: Prevlon boot donned on LLE for heel  wound     Mobility  Bed Mobility Overal bed mobility: Needs Assistance Bed Mobility: Rolling, Supine to Sit, Sit to Supine Rolling: Mod assist, Used rails   Supine to sit: Mod assist, +2 for physical assistance Sit to supine: Mod assist, +2 for physical assistance        Transfers Overall transfer level: Needs assistance Equipment used: Rolling walker (2 wheels) Transfers: Sit to/from Stand Sit to Stand: Mod assist, +2 physical assistance           General transfer comment: stood x 2 - inc large loose BM    Ambulation/Gait               General Gait Details: unable at this time   Stairs             Wheelchair Mobility     Tilt Bed    Modified Rankin (Stroke Patients Only)       Balance Overall balance assessment: Needs assistance Sitting-balance support: Feet unsupported, Bilateral upper extremity supported Sitting balance-Leahy Scale: Fair     Standing balance support: Bilateral upper extremity supported Standing balance-Leahy Scale: Zero Standing balance comment: flexed posture with +2 assist to stand                            Communication Communication Communication: Impaired Factors Affecting Communication: Hearing impaired  Cognition Arousal: Alert Behavior During Therapy: Agitated   PT - Cognitive impairments: No family/caregiver present to determine baseline  PT - Cognition Comments: generally grumpy and irriatated with staff/tasks Following commands: Impaired Following commands impaired: Follows one step commands with increased time    Cueing Cueing Techniques: Verbal cues, Tactile cues  Exercises Other Exercises Other Exercises: full bed bath and linen change    General Comments        Pertinent Vitals/Pain Pain Assessment Pain Assessment: Faces Faces Pain Scale: Hurts even more Pain Location: LLE with movement - frequently yells out but seems inconsistant and somewhat  behavioural Pain Descriptors / Indicators: Sore Pain Intervention(s): Limited activity within patient's tolerance, Monitored during session, Premedicated before session, Repositioned    Home Living                          Prior Function            PT Goals (current goals can now be found in the care plan section) Progress towards PT goals: Progressing toward goals    Frequency    Min 2X/week      PT Plan      Co-evaluation              AM-PAC PT 6 Clicks Mobility   Outcome Measure  Help needed turning from your back to your side while in a flat bed without using bedrails?: A Lot Help needed moving from lying on your back to sitting on the side of a flat bed without using bedrails?: Total Help needed moving to and from a bed to a chair (including a wheelchair)?: Total Help needed standing up from a chair using your arms (e.g., wheelchair or bedside chair)?: Total Help needed to walk in hospital room?: Total Help needed climbing 3-5 steps with a railing? : Total 6 Click Score: 7    End of Session Equipment Utilized During Treatment: Gait belt Activity Tolerance: Patient limited by pain;Treatment limited secondary to agitation Patient left: in bed;with call bell/phone within reach;with bed alarm set Nurse Communication: Mobility status PT Visit Diagnosis: Other abnormalities of gait and mobility (R26.89);Muscle weakness (generalized) (M62.81);History of falling (Z91.81);Pain Pain - Right/Left: Left Pain - part of body: Hip;Knee;Ankle and joints of foot     Time: 1005-1027 PT Time Calculation (min) (ACUTE ONLY): 22 min  Charges:    $Therapeutic Activity: 8-22 mins PT General Charges $$ ACUTE PT VISIT: 1 Visit                   Lauraine Gills, PTA 11/14/23, 10:41 AM

## 2023-11-15 ENCOUNTER — Ambulatory Visit: Admitting: Physician Assistant

## 2023-11-15 ENCOUNTER — Inpatient Hospital Stay

## 2023-11-15 DIAGNOSIS — N390 Urinary tract infection, site not specified: Secondary | ICD-10-CM | POA: Diagnosis not present

## 2023-11-15 LAB — CBC
HCT: 37 % (ref 36.0–46.0)
Hemoglobin: 11.8 g/dL — ABNORMAL LOW (ref 12.0–15.0)
MCH: 28.1 pg (ref 26.0–34.0)
MCHC: 31.9 g/dL (ref 30.0–36.0)
MCV: 88.1 fL (ref 80.0–100.0)
Platelets: 296 K/uL (ref 150–400)
RBC: 4.2 MIL/uL (ref 3.87–5.11)
RDW: 17.4 % — ABNORMAL HIGH (ref 11.5–15.5)
WBC: 5.6 K/uL (ref 4.0–10.5)
nRBC: 0 % (ref 0.0–0.2)

## 2023-11-15 LAB — GLUCOSE, CAPILLARY
Glucose-Capillary: 106 mg/dL — ABNORMAL HIGH (ref 70–99)
Glucose-Capillary: 114 mg/dL — ABNORMAL HIGH (ref 70–99)

## 2023-11-15 LAB — BASIC METABOLIC PANEL WITH GFR
Anion gap: 10 (ref 5–15)
BUN: 15 mg/dL (ref 8–23)
CO2: 27 mmol/L (ref 22–32)
Calcium: 9.1 mg/dL (ref 8.9–10.3)
Chloride: 100 mmol/L (ref 98–111)
Creatinine, Ser: 0.87 mg/dL (ref 0.44–1.00)
GFR, Estimated: >60 mL/min (ref >60)
Glucose, Bld: 96 mg/dL (ref 70–99)
Potassium: 3 mmol/L — ABNORMAL LOW (ref 3.5–5.1)
Sodium: 137 mmol/L (ref 135–145)

## 2023-11-15 MED ORDER — SCOPOLAMINE 1 MG/3DAYS TD PT72
1.0000 | MEDICATED_PATCH | TRANSDERMAL | Status: DC
  Administered 2023-11-15 – 2023-11-18 (×2): 1.5 mg via TRANSDERMAL
  Administered 2023-11-21: 1 mg via TRANSDERMAL
  Filled 2023-11-15 (×3): qty 1

## 2023-11-15 MED ORDER — LACTULOSE 10 GM/15ML PO SOLN
20.0000 g | Freq: Two times a day (BID) | ORAL | Status: DC
  Administered 2023-11-15 – 2023-11-17 (×4): 20 g via ORAL
  Filled 2023-11-15 (×4): qty 30

## 2023-11-15 MED ORDER — HYDRALAZINE HCL 20 MG/ML IJ SOLN
20.0000 mg | Freq: Four times a day (QID) | INTRAMUSCULAR | Status: DC | PRN
  Administered 2023-11-15 – 2023-11-21 (×3): 20 mg via INTRAVENOUS
  Filled 2023-11-15 (×3): qty 1

## 2023-11-15 NOTE — TOC Progression Note (Signed)
 Transition of Care [facility]) - Progression Note    Patient Details  Name: Stacy Kirk MRN: [id number] Date of Birth: June 29, 1945  [facility] CM/SW Contact  Marinda Cooks, RN Phone Number: 11/15/2023, 3:59 PM  Clinical Narrative:    This CM spoke with pt husband today in f/u to pending ins appeal to receive updates and was informed he called on 11/14/23 and started process and called back today and was given this number to call for fast appeals [phone] and was not given an update. This CM called same number and spoke to a representative that informed ins shara is still pending the reference # this CM  was provided is ([id number]). TOC will cont to follow dc planning / care coordination and update as applicable.     Expected Discharge Plan: Skilled Nursing Facility Barriers to Discharge: Continued Medical Work up, SNF Pending bed offer, English as a second language teacher               Expected Discharge Plan and Services   Discharge Planning Services: CM Consult   Living arrangements for the past 2 months: Single Family Home                                       Social Drivers of Health (SDOH) Interventions SDOH Screenings   Food Insecurity: No Food Insecurity (11/08/2023)  Recent Concern: Food Insecurity - Food Insecurity Present (09/27/2023)   [facility]  Housing: Low Risk  (11/08/2023)  Transportation Needs: No Transportation Needs (11/08/2023)  Utilities: Not At Risk (11/08/2023)  Financial Resource Strain: Medium Risk (09/27/2023)   Received from [facility]  Social Connections: Unknown (11/08/2023)  Tobacco Use: Medium Risk (11/07/2023)    Readmission Risk Interventions     No data to display

## 2023-11-15 NOTE — Plan of Care (Signed)
  Problem: Education: Goal: Individualized Educational Video(s) Outcome: Progressing   Problem: Coping: Goal: Ability to adjust to condition or change in health will improve Outcome: Progressing   Problem: Fluid Volume: Goal: Ability to maintain a balanced intake and output will improve Outcome: Progressing   Problem: Health Behavior/Discharge Planning: Goal: Ability to identify and utilize available resources and services will improve Outcome: Progressing   Problem: Nutritional: Goal: Maintenance of adequate nutrition will improve Outcome: Progressing   Problem: Skin Integrity: Goal: Risk for impaired skin integrity will decrease Outcome: Progressing   Problem: Education: Goal: Knowledge of General Education information will improve Description: Including pain rating scale, medication(s)/side effects and non-pharmacologic comfort measures Outcome: Progressing

## 2023-11-15 NOTE — Plan of Care (Signed)
  Problem: Education: Goal: Individualized Educational Video(s) Outcome: Progressing   Problem: Coping: Goal: Ability to adjust to condition or change in health will improve Outcome: Progressing   Problem: Fluid Volume: Goal: Ability to maintain a balanced intake and output will improve Outcome: Progressing   Problem: Health Behavior/Discharge Planning: Goal: Ability to identify and utilize available resources and services will improve Outcome: Progressing   Problem: Nutritional: Goal: Maintenance of adequate nutrition will improve Outcome: Progressing   Problem: Skin Integrity: Goal: Risk for impaired skin integrity will decrease Outcome: Progressing   Problem: Education: Goal: Knowledge of General Education information will improve Description: Including pain rating scale, medication(s)/side effects and non-pharmacologic comfort measures Outcome: Progressing   Problem: Clinical Measurements: Goal: Will remain free from infection Outcome: Progressing Goal: Respiratory complications will improve Outcome: Progressing Goal: Cardiovascular complication will be avoided Outcome: Progressing   Problem: Activity: Goal: Risk for activity intolerance will decrease Outcome: Progressing   Problem: Elimination: Goal: Will not experience complications related to bowel motility Outcome: Progressing   Problem: Safety: Goal: Ability to remain free from injury will improve Outcome: Progressing

## 2023-11-15 NOTE — Progress Notes (Signed)
 PROGRESS NOTE    Stacy Kirk  FMW:[id number] DOB: Aug 06, 1945 DOA: 11/07/2023 PCP: Wendee Lynwood HERO, NP   Assessment & Plan:   Principal Problem:   Complicated urinary tract infection Active Problems:   Sepsis due to urinary tract infection (HCC)   AMS (altered mental status)  Assessment and Plan: Recurrent UTI: likely secondary to indwelling foley since last admission due to acute urinary retention. Urine cx growing pseudomonas, enterococcus. Continue on ampicillin . Completed cipro  course    Acute metabolic encephalopathy: possible underlying dementia. 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   DM2: HbA1c 5.1. D/c SSI as not needed currently. Diet controlled  COPD: w/o exacerbation. Bronchodilators prn & encourage incentive spirometry  During previous admission had decompensated to the point of requiring BiPAP   Chronic diastolic CHF: appears compensated. Monitor I/Os. Continue on metoprolol , aldactone . Holding demadex     Hypothyroidism: continue on home dose of levothyroxine     Left hip fracture:  in June. PT consulted    Left heel ulcer: continue w/ wound care. Evaluated by podiatry during previous admission   Constipation: still present as per repeat XR abd. Started on lactulose    Obesity: BMI 32.1. Would benefit from weight loss    DVT prophylaxis:  eliquis  Code Status: DNR Family Communication:  Disposition Plan: possible d/c to SNF  Status is: Inpatient Remains inpatient appropriate because: severity of illness    Level of care: Med-Surg Consultants:    Procedures:  Antimicrobials: ampicillin    Subjective: Pt c/o nausea  Objective: Vitals:   11/14/23 1710 11/14/23 2046 11/15/23 0418 11/15/23 0819  BP: (!) 141/78 123/69 138/72 (!) 157/96  Pulse: 83 82 96 97  Resp: 16 16 16 20   Temp: 97.7 F (36.5 C) 98 F (36.7 C) 98.8 F (37.1 C) 98 F (36.7 C)  TempSrc: Oral Oral Oral  Oral  SpO2: 96% 96% 94% 95%  Weight:      Height:        Intake/Output Summary (Last 24 hours) at 11/15/2023 1142 Last data filed at 11/15/2023 0900 Gross per 24 hour  Intake 0 ml  Output --  Net 0 ml   Filed Weights   11/12/23 0421 11/13/23 0431 11/14/23 0500  Weight: 81.3 kg 85.4 kg 79.7 kg    Examination:  General exam: Appears calm and comfortable  Respiratory system: Clear to auscultation. Respiratory effort normal. Cardiovascular system: S1 & S2+. No rubs, gallops or clicks.. hypoactive bowel sounds heard. Central nervous system: Alert and awake.  Psychiatry: Judgement and insight appears improved. Flat mood and affect     Data Reviewed: I have personally reviewed following labs and imaging studies  CBC: Recent Labs  Lab 11/08/23 1319 11/09/23 0318 11/10/23 0535 11/15/23 0557  WBC 6.6 7.3 5.1 5.6  HGB 12.2 11.8* 10.7* 11.8*  HCT 38.4 38.2 33.1* 37.0  MCV 89.9 91.4 88.7 88.1  PLT 224 264 224 296   Basic Metabolic Panel: Recent Labs  Lab 11/08/23 1319 11/09/23 0318 11/10/23 0535 11/15/23 0557  NA 136 136 134* 137  K 4.3 4.1 3.4* 3.0*  CL 90* 95* 96* 100  CO2 32 28 28 27   GLUCOSE 91 115* 86 96  BUN 37* 41* 38* 15  CREATININE 1.41* 1.64* 1.50* 0.87  CALCIUM 9.0 8.9 9.1 9.1   GFR: Estimated Creatinine Clearance: 50.4 mL/min (by C-G formula based on SCr of 0.87 mg/dL). Liver Function Tests: Recent Labs  Lab 11/10/23  0535  AST 15  ALT 11  ALKPHOS 64  BILITOT 1.2  PROT 6.0*  ALBUMIN 2.8*   No results for input(s): LIPASE, AMYLASE in the last 168 hours. No results for input(s): AMMONIA in the last 168 hours. Coagulation Profile: No results for input(s): INR, PROTIME in the last 168 hours. Cardiac Enzymes: No results for input(s): CKTOTAL, CKMB, CKMBINDEX, TROPONINI in the last 168 hours. BNP (last 3 results) Recent Labs    11/29/22 1028 12/16/22 1130 05/12/23 1407  PROBNP 106.0* 31.0 33.0   HbA1C: No results for  input(s): HGBA1C in the last 72 hours. CBG: Recent Labs  Lab 11/14/23 0759 11/14/23 1205 11/14/23 1537 11/15/23 0009 11/15/23 0820  GLUCAP 94 104* 117* 114* 106*   Lipid Profile: No results for input(s): CHOL, HDL, LDLCALC, TRIG, CHOLHDL, LDLDIRECT in the last 72 hours. Thyroid  Function Tests: No results for input(s): TSH, T4TOTAL, FREET4, T3FREE, THYROIDAB in the last 72 hours. Anemia Panel: No results for input(s): VITAMINB12, FOLATE, FERRITIN, TIBC, IRON , RETICCTPCT in the last 72 hours. Sepsis Labs: No results for input(s): PROCALCITON, LATICACIDVEN in the last 168 hours.  Recent Results (from the past 240 hours)  Urine Culture (for pregnant, neutropenic or urologic patients or patients with an indwelling urinary catheter)     Status: Abnormal   Collection Time: 11/08/23  1:12 AM   Specimen: Urine, Catheterized  Result Value Ref Range Status   Specimen Description   Final    URINE, CATHETERIZED Performed at [facility], [address] [place], [place] [address]    Special Requests   Final    NONE Performed at [facility], [address] [place], [place] [address]    Culture (A)  Final    >=100,000 COLONIES/mL PSEUDOMONAS AERUGINOSA >=100,000 COLONIES/mL ENTEROCOCCUS FAECALIS    Report Status 11/11/2023 FINAL  Final   Organism ID, Bacteria PSEUDOMONAS AERUGINOSA (A)  Final   Organism ID, Bacteria ENTEROCOCCUS FAECALIS (A)  Final      Susceptibility   Enterococcus faecalis - MIC*    AMPICILLIN  <=2 SENSITIVE Sensitive     NITROFURANTOIN <=16 SENSITIVE Sensitive     VANCOMYCIN  1 SENSITIVE Sensitive     * >=100,000 COLONIES/mL ENTEROCOCCUS FAECALIS   Pseudomonas aeruginosa - MIC*    MEROPENEM <=0.25 SENSITIVE Sensitive     CIPROFLOXACIN  0.25 SENSITIVE Sensitive     IMIPENEM 2 SENSITIVE Sensitive     PIP/TAZO Value in next row Sensitive ug/mL     16 SENSITIVEThis is a modified FDA-approved test that has  been validated and its performance characteristics determined by the reporting laboratory.  This laboratory is certified under the Clinical Laboratory Improvement Amendments CLIA as qualified to perform high complexity clinical laboratory testing.    CEFTAZIDIME/AVIBACTAM Value in next row Sensitive ug/mL     16 SENSITIVEThis is a modified FDA-approved test that has been validated and its performance characteristics determined by the reporting laboratory.  This laboratory is certified under the Clinical Laboratory Improvement Amendments CLIA as qualified to perform high complexity clinical laboratory testing.    CEFTOLOZANE/TAZOBACTAM Value in next row Sensitive ug/mL     16 SENSITIVEThis is a modified FDA-approved test that has been validated and its performance characteristics determined by the reporting laboratory.  This laboratory is certified under the Clinical Laboratory Improvement Amendments CLIA as qualified to perform high complexity clinical laboratory testing.    TOBRAMYCIN Value in next row Sensitive      16 SENSITIVEThis is a modified FDA-approved test that has been  validated and its performance characteristics determined by the reporting laboratory.  This laboratory is certified under the Clinical Laboratory Improvement Amendments CLIA as qualified to perform high complexity clinical laboratory testing.    CEFTAZIDIME Value in next row Sensitive      16 SENSITIVEThis is a modified FDA-approved test that has been validated and its performance characteristics determined by the reporting laboratory.  This laboratory is certified under the Clinical Laboratory Improvement Amendments CLIA as qualified to perform high complexity clinical laboratory testing.    * >=100,000 COLONIES/mL PSEUDOMONAS AERUGINOSA  Culture, blood (Routine X 2) w Reflex to ID Panel     Status: None   Collection Time: 11/08/23 10:21 AM   Specimen: BLOOD  Result Value Ref Range Status   Specimen Description BLOOD LEFT  ANTECUBITAL  Final   Special Requests   Final    BOTTLES DRAWN AEROBIC AND ANAEROBIC Blood Culture adequate volume   Culture   Final    NO GROWTH 5 DAYS Performed at [facility], [address] [place], [place] [address]    Report Status 11/13/2023 FINAL  Final  Culture, blood (Routine X 2) w Reflex to ID Panel     Status: None   Collection Time: 11/08/23 10:21 AM   Specimen: BLOOD  Result Value Ref Range Status   Specimen Description BLOOD BLOOD LEFT HAND  Final   Special Requests   Final    BOTTLES DRAWN AEROBIC ONLY Blood Culture results may not be optimal due to an inadequate volume of blood received in culture bottles   Culture   Final    NO GROWTH 5 DAYS Performed at [facility], [address] [place], [place] [address]    Report Status 11/13/2023 FINAL  Final         Radiology Studies: No results found.      Scheduled Meds:  ampicillin   500 mg Oral Q12H   apixaban   5 mg Oral BID   Chlorhexidine  Gluconate Cloth  6 each Topical Daily   collagenase    Topical Daily   erythromycin    Both Eyes Q6H   ketotifen   1 drop Both Eyes BID   levothyroxine   100 mcg Oral Q0600   metoprolol  succinate  25 mg Oral Daily   multivitamin  1 tablet Oral QHS   naphazoline-glycerin   2 drop Both Eyes TID   potassium chloride   40 mEq Oral Once   sodium chloride  flush  3 mL Intravenous Q12H   spironolactone   25 mg Oral Daily   Continuous Infusions:   LOS: 7 days       Anthony CHRISTELLA Pouch, MD [facility] Pager [phone]  If 7PM-7AM, please contact night-coverage [email or web address] 11/15/2023, 11:42 AM '

## 2023-11-16 ENCOUNTER — Telehealth

## 2023-11-16 DIAGNOSIS — N390 Urinary tract infection, site not specified: Secondary | ICD-10-CM | POA: Diagnosis not present

## 2023-11-16 LAB — CBC
HCT: 38.5 % (ref 36.0–46.0)
Hemoglobin: 12.6 g/dL (ref 12.0–15.0)
MCH: 28.4 pg (ref 26.0–34.0)
MCHC: 32.7 g/dL (ref 30.0–36.0)
MCV: 86.7 fL (ref 80.0–100.0)
Platelets: 305 K/uL (ref 150–400)
RBC: 4.44 MIL/uL (ref 3.87–5.11)
RDW: 17.2 % — ABNORMAL HIGH (ref 11.5–15.5)
WBC: 7.3 K/uL (ref 4.0–10.5)
nRBC: 0 % (ref 0.0–0.2)

## 2023-11-16 LAB — BASIC METABOLIC PANEL WITH GFR
Anion gap: 11 (ref 5–15)
BUN: 12 mg/dL (ref 8–23)
CO2: 27 mmol/L (ref 22–32)
Calcium: 9.2 mg/dL (ref 8.9–10.3)
Chloride: 98 mmol/L (ref 98–111)
Creatinine, Ser: 0.74 mg/dL (ref 0.44–1.00)
GFR, Estimated: >60 mL/min (ref >60)
Glucose, Bld: 117 mg/dL — ABNORMAL HIGH (ref 70–99)
Potassium: 3.4 mmol/L — ABNORMAL LOW (ref 3.5–5.1)
Sodium: 136 mmol/L (ref 135–145)

## 2023-11-16 LAB — GLUCOSE, CAPILLARY: Glucose-Capillary: 147 mg/dL — ABNORMAL HIGH (ref 70–99)

## 2023-11-16 MED ORDER — DIPHENHYDRAMINE HCL 25 MG PO CAPS
25.0000 mg | ORAL_CAPSULE | Freq: Four times a day (QID) | ORAL | Status: DC | PRN
  Administered 2023-11-16: 25 mg via ORAL
  Filled 2023-11-16: qty 1

## 2023-11-16 NOTE — Progress Notes (Signed)
 Physical Therapy Treatment Patient Details Name: Stacy Kirk MRN: [id number] DOB: 1945/05/14 Today's Date: 11/16/2023   History of Present Illness Stacy Kirk is an 78 y.o. female with PMH that includes: diastolic CHF, PE on Eliquis , HTN, DM, hypothyroid, GI bleeding, CKD, lymphedema, ovarian cancer, lumbar spinal stenosis, left hip fracture surgery in June, recent hospitalization for AMS/shortness of breath found to be in COPD exacerbation, urinary tract infection and urinary retention with indwelling Foley catheter placement, acute metabolic encephalopathy, and chronic foot ulcer.    PT Comments  Pt was pleasant and motivated to participate during the session and put forth good effort throughout.  Pt occasionally would become agitated/anxious regarding tasks but was easily redirectable.  Pt required cuing for sequencing along with physical assistance with bed mobility tasks and once in sitting presented with L lateral and to a lesser extent posterior instability both of which grossly improved after extensive weight shifting activities.  Once pt's static sitting balance improved multiple attempts were made to come to standing with +2 assist from an elevated EOB.  Pt was able, with assist, to come close to clearing the surface of the bed but could not quite do so.  Pt will benefit from continued PT services upon discharge to safely address deficits listed in patient problem list for decreased caregiver assistance and eventual return to PLOF.       If plan is discharge home, recommend the following: Two people to help with walking and/or transfers;Two people to help with bathing/dressing/bathroom;Help with stairs or ramp for entrance;Assist for transportation;Assistance with cooking/housework   Can travel by private vehicle     No  Equipment Recommendations  Other (comment) (TBD at next venue of care)    Recommendations for Other Services       Precautions / Restrictions  Precautions Precautions: Fall Recall of Precautions/Restrictions: Impaired Restrictions Weight Bearing Restrictions Per Provider Order: No Other Position/Activity Restrictions: Prevalon boot donned on LLE for heel wound     Mobility  Bed Mobility Overal bed mobility: Needs Assistance Bed Mobility: Rolling, Supine to Sit, Sit to Supine Rolling: Mod assist, Used rails   Supine to sit: Mod assist, HOB elevated, Used rails Sit to supine: Mod assist, +2 for physical assistance   General bed mobility comments: Physical assist needed for BLE and trunk control    Transfers Overall transfer level: Needs assistance Equipment used: Rolling walker (2 wheels) Transfers: Sit to/from Stand Sit to Stand: Mod assist, +2 physical assistance           General transfer comment: Mod verbal cues for sequencing with pt able to unweight but not clear the surface of the bed this session even with heavy +2 assist    Ambulation/Gait               General Gait Details: unable   Stairs             Wheelchair Mobility     Tilt Bed    Modified Rankin (Stroke Patients Only)       Balance   Sitting-balance support: Bilateral upper extremity supported Sitting balance-Leahy Scale: Poor Sitting balance - Comments: Mod A initially to prevent L lateral and posterior LOB but improved grossly with weight shifting activities Postural control: Posterior lean, Left lateral lean     Standing balance comment: Unable to come to standing this session  Communication Communication Communication: Impaired Factors Affecting Communication: Hearing impaired  Cognition Arousal: Alert Behavior During Therapy: WFL for tasks assessed/performed   PT - Cognitive impairments: No family/caregiver present to determine baseline                       PT - Cognition Comments: Can become quickly irritated but easily redirected Following commands:  Impaired Following commands impaired: Follows one step commands with increased time    Cueing Cueing Techniques: Verbal cues, Tactile cues, Gestural cues  Exercises Total Joint Exercises Ankle Circles/Pumps: AROM, AAROM, Both, 5 reps, 10 reps (limited DF AROM bilaterally) Quad Sets: Strengthening, Both, 10 reps Gluteal Sets: Strengthening, Both, 10 reps Heel Slides: AAROM, AROM, Strengthening, Both, 10 reps Hip ABduction/ADduction: AAROM, Both, 10 reps, Strengthening Straight Leg Raises: AAROM, Both, Strengthening, 10 reps Long Arc Quad: AROM, Both, 10 reps Knee Flexion: AROM, Both, 10 reps Other Exercises Other Exercises: Right lateral and anterior weight shifting activities in sitting to address L lat and posterior lean/instability    General Comments        Pertinent Vitals/Pain Pain Assessment Pain Assessment: No/denies pain    Home Living                          Prior Function            PT Goals (current goals can now be found in the care plan section) Progress towards PT goals: PT to reassess next treatment    Frequency    Min 1X/week      PT Plan      Co-evaluation              AM-PAC PT 6 Clicks Mobility   Outcome Measure  Help needed turning from your back to your side while in a flat bed without using bedrails?: A Lot Help needed moving from lying on your back to sitting on the side of a flat bed without using bedrails?: A Lot Help needed moving to and from a bed to a chair (including a wheelchair)?: Total Help needed standing up from a chair using your arms (e.g., wheelchair or bedside chair)?: Total Help needed to walk in hospital room?: Total Help needed climbing 3-5 steps with a railing? : Total 6 Click Score: 8    End of Session Equipment Utilized During Treatment: Gait belt Activity Tolerance: Patient tolerated treatment well Patient left: in bed;with call bell/phone within reach;with bed alarm set Nurse Communication:  Mobility status;Other (comment) (Pt requested meds for general itching) PT Visit Diagnosis: Other abnormalities of gait and mobility (R26.89);Muscle weakness (generalized) (M62.81);History of falling (Z91.81);Pain     Time: 8573-8541 PT Time Calculation (min) (ACUTE ONLY): 32 min  Charges:    $Therapeutic Exercise: 8-22 mins $Therapeutic Activity: 8-22 mins PT General Charges $$ ACUTE PT VISIT: 1 Visit                    D. Scott Zakayla Martinec PT, DPT 11/16/23, 3:15 PM

## 2023-11-16 NOTE — TOC Progression Note (Signed)
 Transition of Care [facility]) - Progression Note    Patient Details  Name: Stacy Kirk MRN: [id number] Date of Birth: 18-Jun-1945  Transition of [facility] CM/SW Contact  Dalia GORMAN Fuse, RN Phone Number: 11/16/2023, 4:07 PM  Clinical Narrative:    TOC received a call from the patient's husband explaining that he requested an appeal of the SNF denial. He requested TOC reachout to check the status. TOC spoke with [facility] and was advised appeals requested via authorized representatives take longer. The process can take 72 hours.    Expected Discharge Plan: Skilled Nursing Facility Barriers to Discharge: Continued Medical Work up, SNF Pending bed offer, English as a second language teacher               Expected Discharge Plan and Services   Discharge Planning Services: CM Consult   Living arrangements for the past 2 months: Single Family Home                                       Social Drivers of Health (SDOH) Interventions SDOH Screenings   Food Insecurity: No Food Insecurity (11/08/2023)  Recent Concern: Food Insecurity - Food Insecurity Present (09/27/2023)   Received from [facility]  Housing: Low Risk  (11/08/2023)  Transportation Needs: No Transportation Needs (11/08/2023)  Utilities: Not At Risk (11/08/2023)  Financial Resource Strain: Medium Risk (09/27/2023)   Received from [facility]  Social Connections: Unknown (11/08/2023)  Tobacco Use: Medium Risk (11/07/2023)    Readmission Risk Interventions     No data to display

## 2023-11-16 NOTE — Progress Notes (Signed)
 PROGRESS NOTE    Stacy Kirk  FMW:[id number] DOB: 23-Dec-1945 DOA: 11/07/2023 PCP: Wendee Lynwood HERO, NP   Assessment & Plan:   Principal Problem:   Complicated urinary tract infection Active Problems:   Sepsis due to urinary tract infection (HCC)   AMS (altered mental status)  Assessment and Plan: Recurrent UTI: likely secondary to indwelling foley since last admission due to acute urinary retention. Urine cx growing pseudomonas, enterococcus. Continue on ampicillin . Completed cipro  course   Acute metabolic encephalopathy: possible underlying dementia. 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 D/c SSI as not needed currently. Diet controlled  COPD: w/o exacerbation. Bronchodilators prn & encourage incentive spirometry  During previous admission had decompensated to the point of requiring BiPAP   Chronic diastolic CHF: appears compensated. Monitor I/Os.Continue on home dose of aldactone , metoprolol . Holding demadex     Hypothyroidism: continue on home dose of levothyroxine     Left hip fracture: in June. PT recs SNF. Waiting on appeal decision as per CM    Left heel ulcer: continue w/ wound care. Evaluated by podiatry during previous admission   Constipation: still present as per repeat XR abd. Continue on lactulose     Obesity: BMI 32.1. Would benefit from weight loss    DVT prophylaxis:  eliquis  Code Status: DNR Family Communication:  Disposition Plan: possible d/c to SNF  Status is: Inpatient Remains inpatient appropriate because: waiting on appeal decision from pt's insurance as per CM     Level of care: Med-Surg Consultants:    Procedures:  Antimicrobials: ampicillin    Subjective: Pt c/o malaise  Objective: Vitals:   11/15/23 2133 11/16/23 0500 11/16/23 0557 11/16/23 0908  BP: (!) 154/86  132/86 (!) 142/90  Pulse: (!) 54  97 94  Resp: 16  16 19   Temp: 98.2 F  (36.8 C)  98.7 F (37.1 C) 99.1 F (37.3 C)  TempSrc:      SpO2: 97%  94% 94%  Weight:  81 kg    Height:        Intake/Output Summary (Last 24 hours) at 11/16/2023 0932 Last data filed at 11/15/2023 1900 Gross per 24 hour  Intake 240 ml  Output 750 ml  Net -510 ml   Filed Weights   11/13/23 0431 11/14/23 0500 11/16/23 0500  Weight: 85.4 kg 79.7 kg 81 kg    Examination:  General exam: appears comfortable  Respiratory system: decreased breath sounds b/l  Cardiovascular system: S1/S2+. No rubs or clicks GI: soft, NT, obese, hypoactive bowel sounds Central nervous system: alert and awake. Moves all extremities  Psychiatry: Judgement and insight appears improved. Flat mood and affect    Data Reviewed: I have personally reviewed following labs and imaging studies  CBC: Recent Labs  Lab 11/10/23 0535 11/15/23 0557 11/16/23 0413  WBC 5.1 5.6 7.3  HGB 10.7* 11.8* 12.6  HCT 33.1* 37.0 38.5  MCV 88.7 88.1 86.7  PLT 224 296 305   Basic Metabolic Panel: Recent Labs  Lab 11/10/23 0535 11/15/23 0557 11/16/23 0413  NA 134* 137 136  K 3.4* 3.0* 3.4*  CL 96* 100 98  CO2 28 27 27   GLUCOSE 86 96 117*  BUN 38* 15 12  CREATININE 1.50* 0.87 0.74  CALCIUM 9.1 9.1 9.2   GFR: Estimated Creatinine Clearance: 55.3 mL/min (by C-G formula based on SCr of 0.74 mg/dL). Liver Function Tests: Recent Labs  Lab 11/10/23  0535  AST 15  ALT 11  ALKPHOS 64  BILITOT 1.2  PROT 6.0*  ALBUMIN 2.8*   No results for input(s): LIPASE, AMYLASE in the last 168 hours. No results for input(s): AMMONIA in the last 168 hours. Coagulation Profile: No results for input(s): INR, PROTIME in the last 168 hours. Cardiac Enzymes: No results for input(s): CKTOTAL, CKMB, CKMBINDEX, TROPONINI in the last 168 hours. BNP (last 3 results) Recent Labs    11/29/22 1028 12/16/22 1130 05/12/23 1407  PROBNP 106.0* 31.0 33.0   HbA1C: No results for input(s): HGBA1C in the last 72  hours. CBG: Recent Labs  Lab 11/14/23 0759 11/14/23 1205 11/14/23 1537 11/15/23 0009 11/15/23 0820  GLUCAP 94 104* 117* 114* 106*   Lipid Profile: No results for input(s): CHOL, HDL, LDLCALC, TRIG, CHOLHDL, LDLDIRECT in the last 72 hours. Thyroid  Function Tests: No results for input(s): TSH, T4TOTAL, FREET4, T3FREE, THYROIDAB in the last 72 hours. Anemia Panel: No results for input(s): VITAMINB12, FOLATE, FERRITIN, TIBC, IRON , RETICCTPCT in the last 72 hours. Sepsis Labs: No results for input(s): PROCALCITON, LATICACIDVEN in the last 168 hours.  Recent Results (from the past 240 hours)  Urine Culture (for pregnant, neutropenic or urologic patients or patients with an indwelling urinary catheter)     Status: Abnormal   Collection Time: 11/08/23  1:12 AM   Specimen: Urine, Catheterized  Result Value Ref Range Status   Specimen Description   Final    URINE, CATHETERIZED Performed at [facility], [address] [place], [place] [address]    Special Requests   Final    NONE Performed at [facility], [address] [place], [place] [address]    Culture (A)  Final    >=100,000 COLONIES/mL PSEUDOMONAS AERUGINOSA >=100,000 COLONIES/mL ENTEROCOCCUS FAECALIS    Report Status 11/11/2023 FINAL  Final   Organism ID, Bacteria PSEUDOMONAS AERUGINOSA (A)  Final   Organism ID, Bacteria ENTEROCOCCUS FAECALIS (A)  Final      Susceptibility   Enterococcus faecalis - MIC*    AMPICILLIN  <=2 SENSITIVE Sensitive     NITROFURANTOIN <=16 SENSITIVE Sensitive     VANCOMYCIN  1 SENSITIVE Sensitive     * >=100,000 COLONIES/mL ENTEROCOCCUS FAECALIS   Pseudomonas aeruginosa - MIC*    MEROPENEM <=0.25 SENSITIVE Sensitive     CIPROFLOXACIN  0.25 SENSITIVE Sensitive     IMIPENEM 2 SENSITIVE Sensitive     PIP/TAZO Value in next row Sensitive ug/mL     16 SENSITIVEThis is a modified FDA-approved test that has been validated and its performance  characteristics determined by the reporting laboratory.  This laboratory is certified under the Clinical Laboratory Improvement Amendments CLIA as qualified to perform high complexity clinical laboratory testing.    CEFTAZIDIME/AVIBACTAM Value in next row Sensitive ug/mL     16 SENSITIVEThis is a modified FDA-approved test that has been validated and its performance characteristics determined by the reporting laboratory.  This laboratory is certified under the Clinical Laboratory Improvement Amendments CLIA as qualified to perform high complexity clinical laboratory testing.    CEFTOLOZANE/TAZOBACTAM Value in next row Sensitive ug/mL     16 SENSITIVEThis is a modified FDA-approved test that has been validated and its performance characteristics determined by the reporting laboratory.  This laboratory is certified under the Clinical Laboratory Improvement Amendments CLIA as qualified to perform high complexity clinical laboratory testing.    TOBRAMYCIN Value in next row Sensitive      16 SENSITIVEThis is a modified FDA-approved test that has been  validated and its performance characteristics determined by the reporting laboratory.  This laboratory is certified under the Clinical Laboratory Improvement Amendments CLIA as qualified to perform high complexity clinical laboratory testing.    CEFTAZIDIME Value in next row Sensitive      16 SENSITIVEThis is a modified FDA-approved test that has been validated and its performance characteristics determined by the reporting laboratory.  This laboratory is certified under the Clinical Laboratory Improvement Amendments CLIA as qualified to perform high complexity clinical laboratory testing.    * >=100,000 COLONIES/mL PSEUDOMONAS AERUGINOSA  Culture, blood (Routine X 2) w Reflex to ID Panel     Status: None   Collection Time: 11/08/23 10:21 AM   Specimen: BLOOD  Result Value Ref Range Status   Specimen Description BLOOD LEFT ANTECUBITAL  Final   Special Requests    Final    BOTTLES DRAWN AEROBIC AND ANAEROBIC Blood Culture adequate volume   Culture   Final    NO GROWTH 5 DAYS Performed at [facility], [address] [place], [place] [address]    Report Status 11/13/2023 FINAL  Final  Culture, blood (Routine X 2) w Reflex to ID Panel     Status: None   Collection Time: 11/08/23 10:21 AM   Specimen: BLOOD  Result Value Ref Range Status   Specimen Description BLOOD BLOOD LEFT HAND  Final   Special Requests   Final    BOTTLES DRAWN AEROBIC ONLY Blood Culture results may not be optimal due to an inadequate volume of blood received in culture bottles   Culture   Final    NO GROWTH 5 DAYS Performed at [facility], [address] [place], [place] [address]    Report Status 11/13/2023 FINAL  Final         Radiology Studies: DG Abd Portable 1V Result Date: 11/15/2023 CLINICAL DATA:  13914 Abdominal distention 13914 EXAM: PORTABLE ABDOMEN - 1 VIEW COMPARISON:  October 24, 2023 FINDINGS: Nonobstructive bowel gas pattern. Large volume fecal loading throughout the entire colon. No pneumoperitoneum. No organomegaly or radiopaque calculi. No acute fracture or destructive lesion. The lung bases are clear.Diffuse osteopenia. Multilevel degenerative disc disease of the spine. Lumbar and left femoral orthopedic hardware. IMPRESSION: Nonobstructive bowel gas pattern. Large volume fecal loading throughout the colon, worrisome for constipation. Electronically Signed   By: Rogelia Myers M.D.   On: 11/15/2023 15:47        Scheduled Meds:  ampicillin   500 mg Oral Q12H   apixaban   5 mg Oral BID   Chlorhexidine  Gluconate Cloth  6 each Topical Daily   collagenase    Topical Daily   ketotifen   1 drop Both Eyes BID   lactulose   20 g Oral BID   levothyroxine   100 mcg Oral Q0600   metoprolol  succinate  25 mg Oral Daily   multivitamin  1 tablet Oral QHS   naphazoline-glycerin   2 drop Both Eyes TID   potassium chloride   40 mEq Oral Once    scopolamine   1 patch Transdermal Q72H   sodium chloride  flush  3 mL Intravenous Q12H   spironolactone   25 mg Oral Daily   Continuous Infusions:   LOS: 8 days       Anthony CHRISTELLA Pouch, MD [facility] Pager [phone]  If 7PM-7AM, please contact night-coverage [email or web address] 11/16/2023, 9:32 AM '

## 2023-11-16 NOTE — Plan of Care (Signed)
  Problem: Education: Goal: Individualized Educational Video(s) Outcome: Progressing   Problem: Coping: Goal: Ability to adjust to condition or change in health will improve Outcome: Progressing   Problem: Metabolic: Goal: Ability to maintain appropriate glucose levels will improve Outcome: Progressing   Problem: Nutritional: Goal: Progress toward achieving an optimal weight will improve Outcome: Progressing   Problem: Skin Integrity: Goal: Risk for impaired skin integrity will decrease Outcome: Progressing   Problem: Education: Goal: Knowledge of General Education information will improve Description: Including pain rating scale, medication(s)/side effects and non-pharmacologic comfort measures Outcome: Progressing   Problem: Clinical Measurements: Goal: Respiratory complications will improve Outcome: Progressing

## 2023-11-17 DIAGNOSIS — N390 Urinary tract infection, site not specified: Secondary | ICD-10-CM | POA: Diagnosis not present

## 2023-11-17 LAB — CBC
HCT: 35.9 % — ABNORMAL LOW (ref 36.0–46.0)
Hemoglobin: 11.8 g/dL — ABNORMAL LOW (ref 12.0–15.0)
MCH: 28.9 pg (ref 26.0–34.0)
MCHC: 32.9 g/dL (ref 30.0–36.0)
MCV: 88 fL (ref 80.0–100.0)
Platelets: 296 K/uL (ref 150–400)
RBC: 4.08 MIL/uL (ref 3.87–5.11)
RDW: 17.6 % — ABNORMAL HIGH (ref 11.5–15.5)
WBC: 8.4 K/uL (ref 4.0–10.5)
nRBC: 0 % (ref 0.0–0.2)

## 2023-11-17 LAB — BASIC METABOLIC PANEL WITH GFR
Anion gap: 6 (ref 5–15)
BUN: 10 mg/dL (ref 8–23)
CO2: 32 mmol/L (ref 22–32)
Calcium: 8.8 mg/dL — ABNORMAL LOW (ref 8.9–10.3)
Chloride: 96 mmol/L — ABNORMAL LOW (ref 98–111)
Creatinine, Ser: 0.8 mg/dL (ref 0.44–1.00)
GFR, Estimated: >60 mL/min (ref >60)
Glucose, Bld: 90 mg/dL (ref 70–99)
Potassium: 3.4 mmol/L — ABNORMAL LOW (ref 3.5–5.1)
Sodium: 134 mmol/L — ABNORMAL LOW (ref 135–145)

## 2023-11-17 MED ORDER — LACTULOSE 10 GM/15ML PO SOLN
20.0000 g | Freq: Every day | ORAL | Status: DC
  Filled 2023-11-17 (×4): qty 30

## 2023-11-17 MED ORDER — PROCHLORPERAZINE EDISYLATE 10 MG/2ML IJ SOLN
10.0000 mg | Freq: Four times a day (QID) | INTRAMUSCULAR | Status: DC | PRN
  Administered 2023-11-19 – 2023-11-21 (×2): 10 mg via INTRAVENOUS
  Filled 2023-11-17 (×3): qty 2

## 2023-11-17 NOTE — Plan of Care (Signed)

## 2023-11-17 NOTE — Progress Notes (Signed)
 PROGRESS NOTE    Stacy Kirk  FMW:[id number] DOB: 05-13-1945 DOA: 11/07/2023 PCP: Wendee Lynwood HERO, NP   Assessment & Plan:   Principal Problem:   Complicated urinary tract infection Active Problems:   Sepsis due to urinary tract infection (HCC)   AMS (altered mental status)  Assessment and Plan: Recurrent UTI: likely secondary to indwelling foley since last admission due to acute urinary retention. Urine cx growing pseudomonas, enterococcus. Continue on ampicillin . Completed cipro  course   Acute metabolic encephalopathy: possible underlying dementia. 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 D/c SSI as not needed currently. Diet controlled   COPD: w/o exacerbation. Bronchodilators prn & encourage incentive spirometry  During previous admission had decompensated to the point of requiring BiPAP   Chronic diastolic CHF: appears compensated. Monitor I/Os. Continue on home dose of metoprolol , aldactone . Holding demadex     Hypothyroidism: continue on home dose of levothyroxine     Left hip fracture: in June. PT recs SNF. Waiting on appeal decision as per CM    Left heel ulcer: continue w/ wound care. Evaluated by podiatry during previous admission   Constipation: still present as per repeat XR abd. Continue on lactulose    Obesity: BMI 32.1. Would benefit from weight loss    DVT prophylaxis:  eliquis  Code Status: DNR Family Communication:  Disposition Plan: possible d/c to SNF  Status is: Inpatient Remains inpatient appropriate because: still waiting on appeal decision from pt's insurance as per CM    Level of care: Med-Surg Consultants:    Procedures:  Antimicrobials: ampicillin    Subjective: Pt c/o nausea   Objective: Vitals:   11/16/23 2034 11/17/23 0325 11/17/23 0347 11/17/23 0838  BP: (!) 130/90  125/63 119/82  Pulse: 95  91   Resp: 17  18 19   Temp: 98.1 F (36.7  C)  98.6 F (37 C) 98.3 F (36.8 C)  TempSrc: Oral     SpO2: 94%  92% 95%  Weight:  82.6 kg    Height:        Intake/Output Summary (Last 24 hours) at 11/17/2023 0930 Last data filed at 11/17/2023 0206 Gross per 24 hour  Intake 240 ml  Output 3400 ml  Net -3160 ml   Filed Weights   11/14/23 0500 11/16/23 0500 11/17/23 0325  Weight: 79.7 kg 81 kg 82.6 kg    Examination:  General exam: appears uncomfortable  Respiratory system: diminished breath sounds b/l  Cardiovascular system: S1 & S2+. No rubs or clicks  GI: soft, NT, obese & normal bowel sounds  Central nervous system: alert & awake. Moves all extremities  Psychiatry: Judgement and insight appears at baseline. Flat mood and affect    Data Reviewed: I have personally reviewed following labs and imaging studies  CBC: Recent Labs  Lab 11/15/23 0557 11/16/23 0413 11/17/23 0639  WBC 5.6 7.3 8.4  HGB 11.8* 12.6 11.8*  HCT 37.0 38.5 35.9*  MCV 88.1 86.7 88.0  PLT 296 305 296   Basic Metabolic Panel: Recent Labs  Lab 11/15/23 0557 11/16/23 0413 11/17/23 0639  NA 137 136 134*  K 3.0* 3.4* 3.4*  CL 100 98 96*  CO2 27 27 32  GLUCOSE 96 117* 90  BUN 15 12 10   CREATININE 0.87 0.74 0.80  CALCIUM 9.1 9.2 8.8*   GFR: Estimated Creatinine Clearance: 55.9 mL/min (by C-G formula based on SCr of 0.8 mg/dL). Liver  Function Tests: No results for input(s): AST, ALT, ALKPHOS, BILITOT, PROT, ALBUMIN in the last 168 hours.  No results for input(s): LIPASE, AMYLASE in the last 168 hours. No results for input(s): AMMONIA in the last 168 hours. Coagulation Profile: No results for input(s): INR, PROTIME in the last 168 hours. Cardiac Enzymes: No results for input(s): CKTOTAL, CKMB, CKMBINDEX, TROPONINI in the last 168 hours. BNP (last 3 results) Recent Labs    11/29/22 1028 12/16/22 1130 05/12/23 1407  PROBNP 106.0* 31.0 33.0   HbA1C: No results for input(s): HGBA1C in the last 72  hours. CBG: Recent Labs  Lab 11/14/23 1205 11/14/23 1537 11/15/23 0009 11/15/23 0820 11/16/23 2035  GLUCAP 104* 117* 114* 106* 147*   Lipid Profile: No results for input(s): CHOL, HDL, LDLCALC, TRIG, CHOLHDL, LDLDIRECT in the last 72 hours. Thyroid  Function Tests: No results for input(s): TSH, T4TOTAL, FREET4, T3FREE, THYROIDAB in the last 72 hours. Anemia Panel: No results for input(s): VITAMINB12, FOLATE, FERRITIN, TIBC, IRON , RETICCTPCT in the last 72 hours. Sepsis Labs: No results for input(s): PROCALCITON, LATICACIDVEN in the last 168 hours.  Recent Results (from the past 240 hours)  Urine Culture (for pregnant, neutropenic or urologic patients or patients with an indwelling urinary catheter)     Status: Abnormal   Collection Time: 11/08/23  1:12 AM   Specimen: Urine, Catheterized  Result Value Ref Range Status   Specimen Description   Final    URINE, CATHETERIZED Performed at [facility], [address] [place], [place] [address]    Special Requests   Final    NONE Performed at [facility], [address] [place], [place] [address]    Culture (A)  Final    >=100,000 COLONIES/mL PSEUDOMONAS AERUGINOSA >=100,000 COLONIES/mL ENTEROCOCCUS FAECALIS    Report Status 11/11/2023 FINAL  Final   Organism ID, Bacteria PSEUDOMONAS AERUGINOSA (A)  Final   Organism ID, Bacteria ENTEROCOCCUS FAECALIS (A)  Final      Susceptibility   Enterococcus faecalis - MIC*    AMPICILLIN  <=2 SENSITIVE Sensitive     NITROFURANTOIN <=16 SENSITIVE Sensitive     VANCOMYCIN  1 SENSITIVE Sensitive     * >=100,000 COLONIES/mL ENTEROCOCCUS FAECALIS   Pseudomonas aeruginosa - MIC*    MEROPENEM <=0.25 SENSITIVE Sensitive     CIPROFLOXACIN  0.25 SENSITIVE Sensitive     IMIPENEM 2 SENSITIVE Sensitive     PIP/TAZO Value in next row Sensitive ug/mL     16 SENSITIVEThis is a modified FDA-approved test that has been validated and its  performance characteristics determined by the reporting laboratory.  This laboratory is certified under the Clinical Laboratory Improvement Amendments CLIA as qualified to perform high complexity clinical laboratory testing.    CEFTAZIDIME/AVIBACTAM Value in next row Sensitive ug/mL     16 SENSITIVEThis is a modified FDA-approved test that has been validated and its performance characteristics determined by the reporting laboratory.  This laboratory is certified under the Clinical Laboratory Improvement Amendments CLIA as qualified to perform high complexity clinical laboratory testing.    CEFTOLOZANE/TAZOBACTAM Value in next row Sensitive ug/mL     16 SENSITIVEThis is a modified FDA-approved test that has been validated and its performance characteristics determined by the reporting laboratory.  This laboratory is certified under the Clinical Laboratory Improvement Amendments CLIA as qualified to perform high complexity clinical laboratory testing.    TOBRAMYCIN Value in next row Sensitive      16 SENSITIVEThis is a modified FDA-approved test that has been validated and its  performance characteristics determined by the reporting laboratory.  This laboratory is certified under the Clinical Laboratory Improvement Amendments CLIA as qualified to perform high complexity clinical laboratory testing.    CEFTAZIDIME Value in next row Sensitive      16 SENSITIVEThis is a modified FDA-approved test that has been validated and its performance characteristics determined by the reporting laboratory.  This laboratory is certified under the Clinical Laboratory Improvement Amendments CLIA as qualified to perform high complexity clinical laboratory testing.    * >=100,000 COLONIES/mL PSEUDOMONAS AERUGINOSA  Culture, blood (Routine X 2) w Reflex to ID Panel     Status: None   Collection Time: 11/08/23 10:21 AM   Specimen: BLOOD  Result Value Ref Range Status   Specimen Description BLOOD LEFT ANTECUBITAL  Final    Special Requests   Final    BOTTLES DRAWN AEROBIC AND ANAEROBIC Blood Culture adequate volume   Culture   Final    NO GROWTH 5 DAYS Performed at [facility], [address] [place], [place] [address]    Report Status 11/13/2023 FINAL  Final  Culture, blood (Routine X 2) w Reflex to ID Panel     Status: None   Collection Time: 11/08/23 10:21 AM   Specimen: BLOOD  Result Value Ref Range Status   Specimen Description BLOOD BLOOD LEFT HAND  Final   Special Requests   Final    BOTTLES DRAWN AEROBIC ONLY Blood Culture results may not be optimal due to an inadequate volume of blood received in culture bottles   Culture   Final    NO GROWTH 5 DAYS Performed at [facility], [address] [place], [place] [address]    Report Status 11/13/2023 FINAL  Final         Radiology Studies: DG Abd Portable 1V Result Date: 11/15/2023 CLINICAL DATA:  13914 Abdominal distention 13914 EXAM: PORTABLE ABDOMEN - 1 VIEW COMPARISON:  October 24, 2023 FINDINGS: Nonobstructive bowel gas pattern. Large volume fecal loading throughout the entire colon. No pneumoperitoneum. No organomegaly or radiopaque calculi. No acute fracture or destructive lesion. The lung bases are clear.Diffuse osteopenia. Multilevel degenerative disc disease of the spine. Lumbar and left femoral orthopedic hardware. IMPRESSION: Nonobstructive bowel gas pattern. Large volume fecal loading throughout the colon, worrisome for constipation. Electronically Signed   By: Rogelia Myers M.D.   On: 11/15/2023 15:47        Scheduled Meds:  ampicillin   500 mg Oral Q12H   apixaban   5 mg Oral BID   Chlorhexidine  Gluconate Cloth  6 each Topical Daily   collagenase    Topical Daily   ketotifen   1 drop Both Eyes BID   lactulose   20 g Oral BID   levothyroxine   100 mcg Oral Q0600   metoprolol  succinate  25 mg Oral Daily   multivitamin  1 tablet Oral QHS   naphazoline-glycerin   2 drop Both Eyes TID   potassium chloride   40 mEq  Oral Once   scopolamine   1 patch Transdermal Q72H   sodium chloride  flush  3 mL Intravenous Q12H   spironolactone   25 mg Oral Daily   Continuous Infusions:   LOS: 9 days       Anthony CHRISTELLA Pouch, MD [facility] Pager [phone]  If 7PM-7AM, please contact night-coverage [email or web address] 11/17/2023, 9:30 AM '

## 2023-11-18 DIAGNOSIS — N390 Urinary tract infection, site not specified: Secondary | ICD-10-CM | POA: Diagnosis not present

## 2023-11-18 LAB — CBC
HCT: 34.7 % — ABNORMAL LOW (ref 36.0–46.0)
Hemoglobin: 11.4 g/dL — ABNORMAL LOW (ref 12.0–15.0)
MCH: 28.9 pg (ref 26.0–34.0)
MCHC: 32.9 g/dL (ref 30.0–36.0)
MCV: 87.8 fL (ref 80.0–100.0)
Platelets: 286 K/uL (ref 150–400)
RBC: 3.95 MIL/uL (ref 3.87–5.11)
RDW: 17.1 % — ABNORMAL HIGH (ref 11.5–15.5)
WBC: 6.9 K/uL (ref 4.0–10.5)
nRBC: 0 % (ref 0.0–0.2)

## 2023-11-18 LAB — BASIC METABOLIC PANEL WITH GFR
Anion gap: 7 (ref 5–15)
BUN: 10 mg/dL (ref 8–23)
CO2: 27 mmol/L (ref 22–32)
Calcium: 8.8 mg/dL — ABNORMAL LOW (ref 8.9–10.3)
Chloride: 98 mmol/L (ref 98–111)
Creatinine, Ser: 0.8 mg/dL (ref 0.44–1.00)
GFR, Estimated: >60 mL/min (ref >60)
Glucose, Bld: 93 mg/dL (ref 70–99)
Potassium: 3.6 mmol/L (ref 3.5–5.1)
Sodium: 132 mmol/L — ABNORMAL LOW (ref 135–145)

## 2023-11-18 LAB — GLUCOSE, CAPILLARY: Glucose-Capillary: 136 mg/dL — ABNORMAL HIGH (ref 70–99)

## 2023-11-18 NOTE — Plan of Care (Signed)
  Problem: Education: Goal: Ability to describe self-care measures that may prevent or decrease complications (Diabetes Survival Skills Education) will improve Outcome: Progressing Goal: Individualized Educational Video(s) Outcome: Progressing   Problem: Coping: Goal: Ability to adjust to condition or change in health will improve Outcome: Progressing   Problem: Fluid Volume: Goal: Ability to maintain a balanced intake and output will improve Outcome: Progressing   Problem: Metabolic: Goal: Ability to maintain appropriate glucose levels will improve Outcome: Progressing   Problem: Nutritional: Goal: Maintenance of adequate nutrition will improve Outcome: Progressing Goal: Progress toward achieving an optimal weight will improve Outcome: Progressing   Problem: Education: Goal: Knowledge of General Education information will improve Description: Including pain rating scale, medication(s)/side effects and non-pharmacologic comfort measures Outcome: Progressing   Problem: Activity: Goal: Risk for activity intolerance will decrease Outcome: Progressing   Problem: Elimination: Goal: Will not experience complications related to bowel motility Outcome: Progressing Goal: Will not experience complications related to urinary retention Outcome: Progressing   Problem: Skin Integrity: Goal: Risk for impaired skin integrity will decrease Outcome: Progressing

## 2023-11-18 NOTE — Progress Notes (Signed)
 PROGRESS NOTE    Stacy Kirk  FMW:[id number] DOB: 17-Jan-1946 DOA: 11/07/2023 PCP: Wendee Lynwood HERO, NP   Assessment & Plan:   Principal Problem:   Complicated urinary tract infection Active Problems:   Sepsis due to urinary tract infection (HCC)   AMS (altered mental status)  Assessment and Plan: Recurrent UTI: likely secondary to indwelling foley since last admission due to acute urinary retention. Urine cx growing pseudomonas, enterococcus. Completed abx course (ampicillin  & cipro )   Acute metabolic encephalopathy: possible underlying dementia. 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 D/c SSI as not needed currently Diet controlled  COPD: w/o exacerbation. Bronchodilators prn & encourage incentive spirometry  During previous admission had decompensated to the point of requiring BiPAP   Chronic diastolic CHF: appears compensated. Monitor I/Os. Continue on aldactone , metoprolol . Holding demadex     Hypothyroidism: continue on home dose of levothyroxine     Left hip fracture: in June. PT recs SNF. Waiting on appeal decision still as per CM   Left heel ulcer: continue w/ wound care. Evaluated by podiatry during previous admission   Constipation: improved. Continue w/ lactulose    Obesity: BMI 32.1. Would benefit from weight loss    DVT prophylaxis:  eliquis  Code Status: DNR Family Communication:  Disposition Plan: possible d/c to SNF  Status is: Inpatient Remains inpatient appropriate because: still waiting on appeal decision from pt's insurance as per CM    Level of care: Med-Surg Consultants:    Procedures:  Antimicrobials:    Subjective: Pt c/o knee pain   Objective: Vitals:   11/17/23 1839 11/17/23 2004 11/18/23 0436 11/18/23 0811  BP: (!) 161/99 126/85 109/77 132/70  Pulse: 83   97  Resp: 20 18 18 16   Temp: 98.8 F (37.1 C) 98.6 F (37 C) 98.4 F (36.9 C)  98.8 F (37.1 C)  TempSrc: Oral     SpO2: 94% 96% 93% 92%  Weight:      Height:        Intake/Output Summary (Last 24 hours) at 11/18/2023 0926 Last data filed at 11/18/2023 0436 Gross per 24 hour  Intake 0 ml  Output 800 ml  Net -800 ml   Filed Weights   11/14/23 0500 11/16/23 0500 11/17/23 0325  Weight: 79.7 kg 81 kg 82.6 kg    Examination:  General exam: appears calm but uncomfortable  Respiratory system: decreased breath sounds b/l  Cardiovascular system: S1/S2+. No rubs or clicks  GI: soft, NT, obese & normal bowel sounds  Central nervous system: alert & awake. Moves all extremities  Psychiatry: Judgement and insight appears at baseline. Flat mood and affect    Data Reviewed: I have personally reviewed following labs and imaging studies  CBC: Recent Labs  Lab 11/15/23 0557 11/16/23 0413 11/17/23 0639 11/18/23 0537  WBC 5.6 7.3 8.4 6.9  HGB 11.8* 12.6 11.8* 11.4*  HCT 37.0 38.5 35.9* 34.7*  MCV 88.1 86.7 88.0 87.8  PLT 296 305 296 286   Basic Metabolic Panel: Recent Labs  Lab 11/15/23 0557 11/16/23 0413 11/17/23 0639 11/18/23 0537  NA 137 136 134* 132*  K 3.0* 3.4* 3.4* 3.6  CL 100 98 96* 98  CO2 27 27 32 27  GLUCOSE 96 117* 90 93  BUN 15 12 10 10   CREATININE 0.87 0.74 0.80 0.80  CALCIUM 9.1 9.2 8.8* 8.8*   GFR: Estimated Creatinine Clearance: 55.9 mL/min (  by C-G formula based on SCr of 0.8 mg/dL). Liver Function Tests: No results for input(s): AST, ALT, ALKPHOS, BILITOT, PROT, ALBUMIN in the last 168 hours.  No results for input(s): LIPASE, AMYLASE in the last 168 hours. No results for input(s): AMMONIA in the last 168 hours. Coagulation Profile: No results for input(s): INR, PROTIME in the last 168 hours. Cardiac Enzymes: No results for input(s): CKTOTAL, CKMB, CKMBINDEX, TROPONINI in the last 168 hours. BNP (last 3 results) Recent Labs    11/29/22 1028 12/16/22 1130 05/12/23 1407  PROBNP 106.0* 31.0 33.0    HbA1C: No results for input(s): HGBA1C in the last 72 hours. CBG: Recent Labs  Lab 11/14/23 1205 11/14/23 1537 11/15/23 0009 11/15/23 0820 11/16/23 2035  GLUCAP 104* 117* 114* 106* 147*   Lipid Profile: No results for input(s): CHOL, HDL, LDLCALC, TRIG, CHOLHDL, LDLDIRECT in the last 72 hours. Thyroid  Function Tests: No results for input(s): TSH, T4TOTAL, FREET4, T3FREE, THYROIDAB in the last 72 hours. Anemia Panel: No results for input(s): VITAMINB12, FOLATE, FERRITIN, TIBC, IRON , RETICCTPCT in the last 72 hours. Sepsis Labs: No results for input(s): PROCALCITON, LATICACIDVEN in the last 168 hours.  Recent Results (from the past 240 hours)  Culture, blood (Routine X 2) w Reflex to ID Panel     Status: None   Collection Time: 11/08/23 10:21 AM   Specimen: BLOOD  Result Value Ref Range Status   Specimen Description BLOOD LEFT ANTECUBITAL  Final   Special Requests   Final    BOTTLES DRAWN AEROBIC AND ANAEROBIC Blood Culture adequate volume   Culture   Final    NO GROWTH 5 DAYS Performed at [facility], [address] [place], [place] [address]    Report Status 11/13/2023 FINAL  Final  Culture, blood (Routine X 2) w Reflex to ID Panel     Status: None   Collection Time: 11/08/23 10:21 AM   Specimen: BLOOD  Result Value Ref Range Status   Specimen Description BLOOD BLOOD LEFT HAND  Final   Special Requests   Final    BOTTLES DRAWN AEROBIC ONLY Blood Culture results may not be optimal due to an inadequate volume of blood received in culture bottles   Culture   Final    NO GROWTH 5 DAYS Performed at [facility], [address] [place], [place] [address]    Report Status 11/13/2023 FINAL  Final         Radiology Studies: No results found.       Scheduled Meds:  apixaban   5 mg Oral BID   Chlorhexidine  Gluconate Cloth  6 each Topical Daily   collagenase    Topical Daily   ketotifen   1 drop Both  Eyes BID   lactulose   20 g Oral Daily   levothyroxine   100 mcg Oral Q0600   metoprolol  succinate  25 mg Oral Daily   multivitamin  1 tablet Oral QHS   potassium chloride   40 mEq Oral Once   scopolamine   1 patch Transdermal Q72H   sodium chloride  flush  3 mL Intravenous Q12H   spironolactone   25 mg Oral Daily   Continuous Infusions:   LOS: 10 days       Anthony CHRISTELLA Pouch, MD [facility] Pager [phone]  If 7PM-7AM, please contact night-coverage [email or web address] 11/18/2023, 9:26 AM '

## 2023-11-18 NOTE — Plan of Care (Signed)
 Patient remains free from any noted signs of acute distress.  Has not required any additional medical interventions during current shift.  Foley catheter remains free from any noted complications.  No signs of UTI/sediment upon inspection of drainage bag.  Wound care to left heel performed per MD order.  Patient noted to tolerate dressing change without acute distress.  Patient will continue to be monitored by hospital staff until discharged.

## 2023-11-19 DIAGNOSIS — N390 Urinary tract infection, site not specified: Secondary | ICD-10-CM | POA: Diagnosis not present

## 2023-11-19 LAB — CBC
HCT: 35.8 % — ABNORMAL LOW (ref 36.0–46.0)
Hemoglobin: 11.5 g/dL — ABNORMAL LOW (ref 12.0–15.0)
MCH: 28.5 pg (ref 26.0–34.0)
MCHC: 32.1 g/dL (ref 30.0–36.0)
MCV: 88.6 fL (ref 80.0–100.0)
Platelets: 309 K/uL (ref 150–400)
RBC: 4.04 MIL/uL (ref 3.87–5.11)
RDW: 17 % — ABNORMAL HIGH (ref 11.5–15.5)
WBC: 6.3 K/uL (ref 4.0–10.5)
nRBC: 0 % (ref 0.0–0.2)

## 2023-11-19 LAB — BASIC METABOLIC PANEL WITH GFR
Anion gap: 9 (ref 5–15)
BUN: 11 mg/dL (ref 8–23)
CO2: 28 mmol/L (ref 22–32)
Calcium: 8.9 mg/dL (ref 8.9–10.3)
Chloride: 97 mmol/L — ABNORMAL LOW (ref 98–111)
Creatinine, Ser: 0.75 mg/dL (ref 0.44–1.00)
GFR, Estimated: >60 mL/min (ref >60)
Glucose, Bld: 88 mg/dL (ref 70–99)
Potassium: 3.4 mmol/L — ABNORMAL LOW (ref 3.5–5.1)
Sodium: 134 mmol/L — ABNORMAL LOW (ref 135–145)

## 2023-11-19 LAB — HEMOGLOBIN AND HEMATOCRIT, BLOOD
HCT: 38.9 % (ref 36.0–46.0)
Hemoglobin: 12.5 g/dL (ref 12.0–15.0)

## 2023-11-19 MED ORDER — PANTOPRAZOLE SODIUM 40 MG IV SOLR
40.0000 mg | Freq: Two times a day (BID) | INTRAVENOUS | Status: DC
  Administered 2023-11-19 – 2023-11-23 (×8): 40 mg via INTRAVENOUS
  Filled 2023-11-19 (×8): qty 10

## 2023-11-19 NOTE — Plan of Care (Addendum)
 Patient is alert and oriented x 2.  She complained  nausea and got prn medicine as order.  she had a large black bowel movement.  Her blood pressure is 167/109, gave PRN Hydralazine . Rechecked blood pressure is 150/82.  MD made aware about above condition. Got new order.  Denies any pain.   Problem: Education: Goal: Ability to describe self-care measures that may prevent or decrease complications (Diabetes Survival Skills Education) will improve Outcome: Progressing Goal: Individualized Educational Video(s) Outcome: Progressing   Problem: Coping: Goal: Ability to adjust to condition or change in health will improve Outcome: Progressing   Problem: Fluid Volume: Goal: Ability to maintain a balanced intake and output will improve Outcome: Progressing   Problem: Health Behavior/Discharge Planning: Goal: Ability to identify and utilize available resources and services will improve Outcome: Progressing Goal: Ability to manage health-related needs will improve Outcome: Progressing   Problem: Nutritional: Goal: Maintenance of adequate nutrition will improve Outcome: Progressing Goal: Progress toward achieving an optimal weight will improve Outcome: Progressing   Problem: Skin Integrity: Goal: Risk for impaired skin integrity will decrease Outcome: Progressing   Problem: Clinical Measurements: Goal: Ability to maintain clinical measurements within normal limits will improve Outcome: Progressing Goal: Will remain free from infection Outcome: Progressing Goal: Diagnostic test results will improve Outcome: Progressing Goal: Respiratory complications will improve Outcome: Progressing Goal: Cardiovascular complication will be avoided Outcome: Progressing   Problem: Activity: Goal: Risk for activity intolerance will decrease Outcome: Progressing

## 2023-11-19 NOTE — Plan of Care (Signed)
 Patient remained free from any noted signs of acute GI distress.  Has not required prn medications to assist with N/V.  Foley catheter remains patent.  No noted signs of UTI upon assessment.  Wound care performed to left heel, per MD order.  Patient noted to tolerate procedure.  Patient to continue to be monitored by hospital staff until discharged.

## 2023-11-19 NOTE — Progress Notes (Signed)
 PROGRESS NOTE    Stacy Kirk  FMW:[id number] DOB: 1945-10-31 DOA: 11/07/2023 PCP: Wendee Lynwood HERO, NP   Assessment & Plan:   Principal Problem:   Complicated urinary tract infection Active Problems:   Sepsis due to urinary tract infection (HCC)   AMS (altered mental status)  Assessment and Plan: Recurrent UTI: likely secondary to indwelling foley since last admission due to acute urinary retention. Urine cx growing pseudomonas, enterococcus. Completed abx course. (ampicillin  & cipro )   Acute metabolic encephalopathy: possible underlying dementia. Re-orient prn    Blepharitis: continue on topical antibiotic & eye drops    Hx of PE: continue on home dose of eliquis     DM2: HbA1c 5.1. D/c SSI as not needed currently. Diet controlled  COPD: w/o exacerbation. Bronchodilators prn & encourage incentive spirometry    Chronic diastolic CHF: appears compensated. Monitor I/Os. Continue on metoprolol , aldactone . Holding demadex     Hypothyroidism: continue on home dose of levothyroxine     Left hip fracture: in June. PT recs SNF. Still waiting on appeal decision from pt's insurance as per CM    Left heel ulcer: continue w/ wound care. Evaluated by podiatry during previous admission   Constipation: improved. Continue w/ lactulose    Obesity: BMI 32.1. Would benefit from weight loss    DVT prophylaxis:  eliquis  Code Status: DNR Family Communication: discussed pt's care w/ pt's husband, Kayla, and answered his questions Disposition Plan: possible d/c to SNF  Status is: Inpatient Remains inpatient appropriate because: still waiting on appeal decision from pt's insurance as per CM    Level of care: Med-Surg Consultants:    Procedures:  Antimicrobials:    Subjective: Pt c/o nausea  Objective: Vitals:   11/18/23 1937 11/19/23 0344 11/19/23 0500 11/19/23 0822  BP: 104/60 122/78  (!) 142/96  Pulse: (!) 52 90    Resp:    18  Temp: 98.3 F (36.8 C) 98.3 F (36.8 C)   97.9 F (36.6 C)  TempSrc: Oral     SpO2: 93% 94%  98%  Weight:   79.6 kg   Height:        Intake/Output Summary (Last 24 hours) at 11/19/2023 0907 Last data filed at 11/19/2023 0559 Gross per 24 hour  Intake 240 ml  Output 1000 ml  Net -760 ml   Filed Weights   11/16/23 0500 11/17/23 0325 11/19/23 0500  Weight: 81 kg 82.6 kg 79.6 kg    Examination:  General exam: appears uncomfortable  Respiratory system: diminished breath sounds b/l  Cardiovascular system: S1 & S2+. No rubs or clicks GI: soft, obese, NT & hypoactive bowel sounds Central nervous system: alert & awake. Moves all extremities  Psychiatry: Judgement and insight appears at baseline. Flat mood and affect    Data Reviewed: I have personally reviewed following labs and imaging studies  CBC: Recent Labs  Lab 11/15/23 0557 11/16/23 0413 11/17/23 0639 11/18/23 0537 11/19/23 0501  WBC 5.6 7.3 8.4 6.9 6.3  HGB 11.8* 12.6 11.8* 11.4* 11.5*  HCT 37.0 38.5 35.9* 34.7* 35.8*  MCV 88.1 86.7 88.0 87.8 88.6  PLT 296 305 296 286 309   Basic Metabolic Panel: Recent Labs  Lab 11/15/23 0557 11/16/23 0413 11/17/23 0639 11/18/23 0537 11/19/23 0501  NA 137 136 134* 132* 134*  K 3.0* 3.4* 3.4* 3.6 3.4*  CL 100 98 96* 98 97*  CO2 27 27 32 27 28  GLUCOSE 96 117* 90 93 88  BUN 15 12 10 10 11   CREATININE 0.87 0.74  0.80 0.80 0.75  CALCIUM 9.1 9.2 8.8* 8.8* 8.9   GFR: Estimated Creatinine Clearance: 54.8 mL/min (by C-G formula based on SCr of 0.75 mg/dL). Liver Function Tests: No results for input(s): AST, ALT, ALKPHOS, BILITOT, PROT, ALBUMIN in the last 168 hours.  No results for input(s): LIPASE, AMYLASE in the last 168 hours. No results for input(s): AMMONIA in the last 168 hours. Coagulation Profile: No results for input(s): INR, PROTIME in the last 168 hours. Cardiac Enzymes: No results for input(s): CKTOTAL, CKMB, CKMBINDEX, TROPONINI in the last 168 hours. BNP (last 3  results) Recent Labs    11/29/22 1028 12/16/22 1130 05/12/23 1407  PROBNP 106.0* 31.0 33.0   HbA1C: No results for input(s): HGBA1C in the last 72 hours. CBG: Recent Labs  Lab 11/14/23 1537 11/15/23 0009 11/15/23 0820 11/16/23 2035 11/18/23 1706  GLUCAP 117* 114* 106* 147* 136*   Lipid Profile: No results for input(s): CHOL, HDL, LDLCALC, TRIG, CHOLHDL, LDLDIRECT in the last 72 hours. Thyroid  Function Tests: No results for input(s): TSH, T4TOTAL, FREET4, T3FREE, THYROIDAB in the last 72 hours. Anemia Panel: No results for input(s): VITAMINB12, FOLATE, FERRITIN, TIBC, IRON , RETICCTPCT in the last 72 hours. Sepsis Labs: No results for input(s): PROCALCITON, LATICACIDVEN in the last 168 hours.  No results found for this or any previous visit (from the past 240 hours).        Radiology Studies: No results found.       Scheduled Meds:  apixaban   5 mg Oral BID   Chlorhexidine  Gluconate Cloth  6 each Topical Daily   collagenase    Topical Daily   ketotifen   1 drop Both Eyes BID   lactulose   20 g Oral Daily   levothyroxine   100 mcg Oral Q0600   metoprolol  succinate  25 mg Oral Daily   multivitamin  1 tablet Oral QHS   potassium chloride   40 mEq Oral Once   scopolamine   1 patch Transdermal Q72H   sodium chloride  flush  3 mL Intravenous Q12H   spironolactone   25 mg Oral Daily   Continuous Infusions:   LOS: 11 days       Anthony CHRISTELLA Pouch, MD [facility] Pager [phone]  If 7PM-7AM, please contact night-coverage [email or web address] 11/19/2023, 9:07 AM '

## 2023-11-19 NOTE — TOC Progression Note (Signed)
 Transition of Care [facility]) - Progression Note    Patient Details  Name: Stacy Kirk MRN: [id number] Date of Birth: 06-01-1945  [facility] of [facility] CM/SW Contact  Racheal LITTIE Schimke, RN Phone Number: 11/19/2023, 9:42 AM  Clinical Narrative:  Attempted to call [employer] to follow up on the discharge appeal, recording received that the office was closed. Will attempt tomorrow, 11/20/23.    Expected Discharge Plan: Skilled Nursing Facility Barriers to Discharge: Continued Medical Work up, SNF Pending bed offer, English as a second language teacher               Expected Discharge Plan and Services   Discharge Planning Services: CM Consult   Living arrangements for the past 2 months: Single Family Home Expected Discharge Date: 11/19/23                                     Social Drivers of Health (SDOH) Interventions SDOH Screenings   Food Insecurity: No Food Insecurity (11/08/2023)  Recent Concern: Food Insecurity - Food Insecurity Present (09/27/2023)   Received from [facility]  Housing: Low Risk  (11/08/2023)  Transportation Needs: No Transportation Needs (11/08/2023)  Utilities: Not At Risk (11/08/2023)  Financial Resource Strain: Medium Risk (09/27/2023)   Received from [facility]  Social Connections: Unknown (11/08/2023)  Tobacco Use: Medium Risk (11/07/2023)    Readmission Risk Interventions     No data to display

## 2023-11-20 DIAGNOSIS — N390 Urinary tract infection, site not specified: Secondary | ICD-10-CM | POA: Diagnosis not present

## 2023-11-20 LAB — BASIC METABOLIC PANEL WITH GFR
Anion gap: 10 (ref 5–15)
BUN: 12 mg/dL (ref 8–23)
CO2: 25 mmol/L (ref 22–32)
Calcium: 9.1 mg/dL (ref 8.9–10.3)
Chloride: 96 mmol/L — ABNORMAL LOW (ref 98–111)
Creatinine, Ser: 0.86 mg/dL (ref 0.44–1.00)
GFR, Estimated: >60 mL/min (ref >60)
Glucose, Bld: 99 mg/dL (ref 70–99)
Potassium: 3.1 mmol/L — ABNORMAL LOW (ref 3.5–5.1)
Sodium: 131 mmol/L — ABNORMAL LOW (ref 135–145)

## 2023-11-20 LAB — HEMOGLOBIN AND HEMATOCRIT, BLOOD
HCT: 35.7 % — ABNORMAL LOW (ref 36.0–46.0)
Hemoglobin: 11.7 g/dL — ABNORMAL LOW (ref 12.0–15.0)

## 2023-11-20 LAB — CBC
HCT: 36 % (ref 36.0–46.0)
Hemoglobin: 11.8 g/dL — ABNORMAL LOW (ref 12.0–15.0)
MCH: 28.6 pg (ref 26.0–34.0)
MCHC: 32.8 g/dL (ref 30.0–36.0)
MCV: 87.2 fL (ref 80.0–100.0)
Platelets: 333 K/uL (ref 150–400)
RBC: 4.13 MIL/uL (ref 3.87–5.11)
RDW: 17.2 % — ABNORMAL HIGH (ref 11.5–15.5)
WBC: 6.9 K/uL (ref 4.0–10.5)
nRBC: 0 % (ref 0.0–0.2)

## 2023-11-20 MED ORDER — POTASSIUM CHLORIDE CRYS ER 20 MEQ PO TBCR
40.0000 meq | EXTENDED_RELEASE_TABLET | Freq: Two times a day (BID) | ORAL | Status: AC
Start: 2023-11-20 — End: 2023-11-20
  Administered 2023-11-20 (×2): 40 meq via ORAL
  Filled 2023-11-20 (×2): qty 2

## 2023-11-20 NOTE — Progress Notes (Signed)
 PROGRESS NOTE    Stacy Kirk  FMW:[id number] DOB: Mar 23, 1946 DOA: 11/07/2023 PCP: Wendee Lynwood HERO, NP   Assessment & Plan:   Principal Problem:   Complicated urinary tract infection Active Problems:   Sepsis due to urinary tract infection (HCC)   AMS (altered mental status)  Assessment and Plan: Recurrent UTI: likely secondary to indwelling foley since last admission due to acute urinary retention. Urine cx growing pseudomonas, enterococcus. Completed abx course. (ampicillin  & cipro )  Possible black stool: as per pt's nurse. H&H trending up after black stool on afternoon on 11/19/23. Continue on PPI. Holding home dose of eliquis  today. Will continue to monitor H&H    Acute metabolic encephalopathy: possible underlying dementia. Re-orient prn    Blepharitis: continue on topical antibiotic & eye drops   Hx of PE: holding eliquis  today for possible black stools. Will likely restart eliquis  tomorrow if H&H are stable and/or trending up    DM2: HbA1c 5.1. D/c SSI as not needed currently. Diet controlled   COPD: w/o exacerbation. Bronchodilators prn and encourage incentive spirometry     Chronic diastolic CHF: appears compensated. Monitor I/Os. Continue on aldactone , metoprolol . Holding demadex     Hypothyroidism: continue on home dose of levothyroxine     Left hip fracture: in June. PT recs SNF. Still waiting on appeal decision from pt's insurance as per CM    Left heel ulcer: continue w/ wound care. Evaluated by podiatry during previous admission   Constipation: improved. Pt is refusing to take lactulose    Obesity: BMI 32.1. Would benefit from weight loss    DVT prophylaxis:  eliquis  Code Status: DNR Family Communication: discussed pt's care w/ pt's husband, Stacy Kirk, and answered his questions Disposition Plan: possible d/c to SNF  Status is: Inpatient Remains inpatient appropriate because: waiting on appeal decision from pt's insurance, messaged CM about this and  waiting on a response    Level of care: Med-Surg Consultants:    Procedures:  Antimicrobials:    Subjective: Pt c/o malaise  Objective: Vitals:   11/19/23 2006 11/20/23 0358 11/20/23 0500 11/20/23 0749  BP: 128/81 125/89  (!) 101/51  Pulse: 96 95  94  Resp: 12   17  Temp: 97.8 F (36.6 C) 98.6 F (37 C)  98.6 F (37 C)  TempSrc: Oral Oral    SpO2: 93% 92%  94%  Weight:   82.6 kg   Height:        Intake/Output Summary (Last 24 hours) at 11/20/2023 0914 Last data filed at 11/19/2023 1850 Gross per 24 hour  Intake --  Output 400 ml  Net -400 ml   Filed Weights   11/17/23 0325 11/19/23 0500 11/20/23 0500  Weight: 82.6 kg 79.6 kg 82.6 kg    Examination:  General exam: appears calm but uncomfortable   Respiratory system: decreased breath sounds b/l  Cardiovascular system: S1/S2+. No rubs or clicks  GI: soft, obese, NT, hypoactive bowel sounds  Central nervous system: alert & awake. Moves all extremities  Psychiatry: Judgement and insight appears poor. Flat mood and affect    Data Reviewed: I have personally reviewed following labs and imaging studies  CBC: Recent Labs  Lab 11/16/23 0413 11/17/23 0639 11/18/23 0537 11/19/23 0501 11/19/23 1556 11/20/23 0444  WBC 7.3 8.4 6.9 6.3  --  6.9  HGB 12.6 11.8* 11.4* 11.5* 12.5 11.8*  HCT 38.5 35.9* 34.7* 35.8* 38.9 36.0  MCV 86.7 88.0 87.8 88.6  --  87.2  PLT 305 296 286 309  --  333   Basic Metabolic Panel: Recent Labs  Lab 11/16/23 0413 11/17/23 0639 11/18/23 0537 11/19/23 0501 11/20/23 0444  NA 136 134* 132* 134* 131*  K 3.4* 3.4* 3.6 3.4* 3.1*  CL 98 96* 98 97* 96*  CO2 27 32 27 28 25   GLUCOSE 117* 90 93 88 99  BUN 12 10 10 11 12   CREATININE 0.74 0.80 0.80 0.75 0.86  CALCIUM 9.2 8.8* 8.8* 8.9 9.1   GFR: Estimated Creatinine Clearance: 52 mL/min (by C-G formula based on SCr of 0.86 mg/dL). Liver Function Tests: No results for input(s): AST, ALT, ALKPHOS, BILITOT, PROT, ALBUMIN in  the last 168 hours.  No results for input(s): LIPASE, AMYLASE in the last 168 hours. No results for input(s): AMMONIA in the last 168 hours. Coagulation Profile: No results for input(s): INR, PROTIME in the last 168 hours. Cardiac Enzymes: No results for input(s): CKTOTAL, CKMB, CKMBINDEX, TROPONINI in the last 168 hours. BNP (last 3 results) Recent Labs    11/29/22 1028 12/16/22 1130 05/12/23 1407  PROBNP 106.0* 31.0 33.0   HbA1C: No results for input(s): HGBA1C in the last 72 hours. CBG: Recent Labs  Lab 11/14/23 1537 11/15/23 0009 11/15/23 0820 11/16/23 2035 11/18/23 1706  GLUCAP 117* 114* 106* 147* 136*   Lipid Profile: No results for input(s): CHOL, HDL, LDLCALC, TRIG, CHOLHDL, LDLDIRECT in the last 72 hours. Thyroid  Function Tests: No results for input(s): TSH, T4TOTAL, FREET4, T3FREE, THYROIDAB in the last 72 hours. Anemia Panel: No results for input(s): VITAMINB12, FOLATE, FERRITIN, TIBC, IRON , RETICCTPCT in the last 72 hours. Sepsis Labs: No results for input(s): PROCALCITON, LATICACIDVEN in the last 168 hours.  No results found for this or any previous visit (from the past 240 hours).        Radiology Studies: No results found.       Scheduled Meds:  Chlorhexidine  Gluconate Cloth  6 each Topical Daily   collagenase    Topical Daily   ketotifen   1 drop Both Eyes BID   lactulose   20 g Oral Daily   levothyroxine   100 mcg Oral Q0600   metoprolol  succinate  25 mg Oral Daily   multivitamin  1 tablet Oral QHS   pantoprazole  (PROTONIX ) IV  40 mg Intravenous Q12H   potassium chloride   40 mEq Oral Once   scopolamine   1 patch Transdermal Q72H   sodium chloride  flush  3 mL Intravenous Q12H   spironolactone   25 mg Oral Daily   Continuous Infusions:   LOS: 12 days       Anthony CHRISTELLA Pouch, MD [facility] Pager [phone]  If 7PM-7AM, please contact  night-coverage [email or web address] 11/20/2023, 9:14 AM '

## 2023-11-20 NOTE — Progress Notes (Signed)
 Physical Therapy Treatment Patient Details Name: Stacy Kirk MRN: [id number] DOB: [birth year] Today's Date: 11/20/2023   History of Present Illness Stacy Kirk is an 78 y.o. female with PMH that includes: diastolic CHF, PE on Eliquis , HTN, DM, hypothyroid, GI bleeding, CKD, lymphedema, ovarian cancer, lumbar spinal stenosis, left hip fracture surgery in June, recent hospitalization for AMS/shortness of breath found to be in COPD exacerbation, urinary tract infection and urinary retention with indwelling Foley catheter placement, acute metabolic encephalopathy, and chronic foot ulcer.    PT Comments  Pt generally grumpy but does agree to very minimal supine ex and self limits after a few reps.  She is encouraged to get to EOB and agrees but during transition yells out frequently to stop!  Time is spent to encourage and educate and she eventually allows transition to EOB.  She sits EOB about 5 minutes with increasing right lean and RUE support of rial.  She refuses attempt at standing and asks to lay back down which she does with mod a x 1.  Repositioned with max a x 2.  Blue boot applied on L foot after session and positioned for comfort.   If plan is discharge home, recommend the following: Two people to help with walking and/or transfers;Two people to help with bathing/dressing/bathroom;Help with stairs or ramp for entrance;Assist for transportation;Assistance with cooking/housework   Can travel by private vehicle        Equipment Recommendations       Recommendations for Other Services       Precautions / Restrictions Precautions Precautions: Fall Recall of Precautions/Restrictions: Impaired Restrictions Weight Bearing Restrictions Per Provider Order: No Other Position/Activity Restrictions: Prevalon boot donned on LLE for heel wound     Mobility  Bed Mobility Overal bed mobility: Needs Assistance Bed Mobility: Supine to Sit, Sit to Supine     Supine to sit: Mod  assist, HOB elevated, Used rails Sit to supine: Mod assist, HOB elevated, Used rails     Patient Response: Anxious  Transfers                   General transfer comment: refused    Ambulation/Gait               General Gait Details: unable   Stairs             Wheelchair Mobility     Tilt Bed Tilt Bed Patient Response: Anxious  Modified Rankin (Stroke Patients Only)       Balance Overall balance assessment: Needs assistance Sitting-balance support: Bilateral upper extremity supported Sitting balance-Leahy Scale: Poor   Postural control: Right lateral lean Standing balance support: Bilateral upper extremity supported Standing balance-Leahy Scale: Zero                              Communication Communication Communication: Impaired Factors Affecting Communication: Hearing impaired  Cognition Arousal: Alert Behavior During Therapy: WFL for tasks assessed/performed   PT - Cognitive impairments: No family/caregiver present to determine baseline                       PT - Cognition Comments: easily agitated adn yells out frequently.  Can typically redirect and work through behaviors Following commands: Impaired Following commands impaired: Follows one step commands with increased time    Cueing Cueing Techniques: Verbal cues, Tactile cues, Gestural cues  Exercises Other Exercises Other Exercises: particiaptes in minimal  AROM - 2 reps before saying that's enough    General Comments        Pertinent Vitals/Pain Pain Assessment Pain Assessment: Faces Faces Pain Scale: Hurts whole lot Pain Location: yells frequently during session with varried c/o pain mostly LLE Pain Descriptors / Indicators: Sore Pain Intervention(s): Monitored during session, Limited activity within patient's tolerance, Repositioned    Home Living                          Prior Function            PT Goals (current goals can  now be found in the care plan section) Progress towards PT goals: PT to reassess next treatment    Frequency    Min 1X/week      PT Plan      Co-evaluation              AM-PAC PT 6 Clicks Mobility   Outcome Measure  Help needed turning from your back to your side while in a flat bed without using bedrails?: A Lot Help needed moving from lying on your back to sitting on the side of a flat bed without using bedrails?: Total Help needed moving to and from a bed to a chair (including a wheelchair)?: Total Help needed standing up from a chair using your arms (e.g., wheelchair or bedside chair)?: Total Help needed to walk in hospital room?: Total Help needed climbing 3-5 steps with a railing? : Total 6 Click Score: 7    End of Session   Activity Tolerance: Treatment limited secondary to agitation;Patient limited by pain Patient left: in bed;with call bell/phone within reach;with bed alarm set Nurse Communication: Mobility status;Other (comment) PT Visit Diagnosis: Other abnormalities of gait and mobility (R26.89);Muscle weakness (generalized) (M62.81);History of falling (Z91.81);Pain Pain - Right/Left: Left Pain - part of body: Hip;Knee;Ankle and joints of foot     Time: 8844-8786 PT Time Calculation (min) (ACUTE ONLY): 18 min  Charges:    $Therapeutic Activity: 8-22 mins PT General Charges $$ ACUTE PT VISIT: 1 Visit                   Lauraine Gills, PTA 11/20/23, 1:13 PM

## 2023-11-20 NOTE — TOC Progression Note (Signed)
 Transition of Care [facility]) - Progression Note    Patient Details  Name: Stacy Kirk MRN: [id number] Date of Birth: 08-07-1945  Transition of Care [facility]) CM/SW Contact  Dalia GORMAN Fuse, RN Phone Number: 11/20/2023, 3:01 PM  Clinical Narrative:     WONDA outreached to [facility] at [phone] and was advised there is no appeal request on file [phone]). TOC was transferred to claims ([id number]) and was informed they are also unable to see request. TOC provided dates and time frames form calls that [facility] made last week to check the status of the appeal request. TOC was transitioned to the supervisor and eventually the expedited appeals team to initiate the appeal. [facility] faxed updated clinical to [phone]  Call reference # [id number] Appeal reference # [id number]   Expected Discharge Plan: Skilled Nursing Facility Barriers to Discharge: Continued Medical Work up, SNF Pending bed offer, English as a second language teacher               Expected Discharge Plan and Services   Discharge Planning Services: CM Consult   Living arrangements for the past 2 months: Single Family Home Expected Discharge Date: 11/19/23                                     Social Drivers of Health (SDOH) Interventions SDOH Screenings   Food Insecurity: No Food Insecurity (11/08/2023)  Recent Concern: Food Insecurity - Food Insecurity Present (09/27/2023)   Received from [facility]  Housing: Low Risk  (11/08/2023)  Transportation Needs: No Transportation Needs (11/08/2023)  Utilities: Not At Risk (11/08/2023)  Financial Resource Strain: Medium Risk (09/27/2023)   Received from [facility]  Social Connections: Unknown (11/08/2023)  Tobacco Use: Medium Risk (11/07/2023)    Readmission Risk Interventions     No data to display

## 2023-11-20 NOTE — Plan of Care (Signed)

## 2023-11-21 DIAGNOSIS — N390 Urinary tract infection, site not specified: Secondary | ICD-10-CM | POA: Diagnosis not present

## 2023-11-21 LAB — CBC
HCT: 42.6 % (ref 36.0–46.0)
Hemoglobin: 13.8 g/dL (ref 12.0–15.0)
MCH: 28.3 pg (ref 26.0–34.0)
MCHC: 32.4 g/dL (ref 30.0–36.0)
MCV: 87.5 fL (ref 80.0–100.0)
Platelets: 341 K/uL (ref 150–400)
RBC: 4.87 MIL/uL (ref 3.87–5.11)
RDW: 17.2 % — ABNORMAL HIGH (ref 11.5–15.5)
WBC: 7.7 K/uL (ref 4.0–10.5)
nRBC: 0 % (ref 0.0–0.2)

## 2023-11-21 LAB — BASIC METABOLIC PANEL WITH GFR
Anion gap: 9 (ref 5–15)
BUN: 12 mg/dL (ref 8–23)
CO2: 26 mmol/L (ref 22–32)
Calcium: 9.7 mg/dL (ref 8.9–10.3)
Chloride: 100 mmol/L (ref 98–111)
Creatinine, Ser: 0.99 mg/dL (ref 0.44–1.00)
GFR, Estimated: 58 mL/min — ABNORMAL LOW (ref >60)
Glucose, Bld: 134 mg/dL — ABNORMAL HIGH (ref 70–99)
Potassium: 4 mmol/L (ref 3.5–5.1)
Sodium: 135 mmol/L (ref 135–145)

## 2023-11-21 MED ORDER — POTASSIUM CHLORIDE CRYS ER 20 MEQ PO TBCR
40.0000 meq | EXTENDED_RELEASE_TABLET | Freq: Two times a day (BID) | ORAL | Status: AC
Start: 2023-11-21 — End: 2023-11-21
  Administered 2023-11-21 (×2): 40 meq via ORAL
  Filled 2023-11-21 (×2): qty 2

## 2023-11-21 MED ORDER — APIXABAN 5 MG PO TABS
5.0000 mg | ORAL_TABLET | Freq: Two times a day (BID) | ORAL | Status: DC
  Administered 2023-11-22 (×2): 5 mg via ORAL
  Filled 2023-11-21 (×4): qty 1

## 2023-11-21 MED ORDER — BISACODYL 10 MG RE SUPP
10.0000 mg | Freq: Once | RECTAL | Status: AC
  Administered 2023-11-21: 10 mg via RECTAL
  Filled 2023-11-21: qty 1

## 2023-11-21 NOTE — TOC Progression Note (Signed)
 Transition of Care [facility]) - Progression Note    Patient Details  Name: Stacy Kirk MRN: [id number] Date of Birth: 1945/07/20  Transition of Care [facility]) CM/SW Contact  Dalia GORMAN Fuse, RN Phone Number: 11/21/2023, 9:54 AM  Clinical Narrative:    TOC outreached to [facility] [phone] and was told the expedited appeal is in process. They are expected to render a decision by 11/23/23.  Shara #[id number] Case # [id number] Call # [phone]  TOC was transferred to [facility] [phone] to attempt to get a decision sooner given the circumstances. TOC spoke with [place] who advised they don't speak with providers. TOC was transferred to provider services and was told it can not escalated, the decision will be rendered on or before 11/23/23.   Expected Discharge Plan: Skilled Nursing Facility Barriers to Discharge: Continued Medical Work up, SNF Pending bed offer, English as a second language teacher               Expected Discharge Plan and Services   Discharge Planning Services: CM Consult   Living arrangements for the past 2 months: Single Family Home Expected Discharge Date: 11/19/23                                     Social Drivers of Health (SDOH) Interventions SDOH Screenings   Food Insecurity: No Food Insecurity (11/08/2023)  Recent Concern: Food Insecurity - Food Insecurity Present (09/27/2023)   Received from [facility]  Housing: Low Risk  (11/08/2023)  Transportation Needs: No Transportation Needs (11/08/2023)  Utilities: Not At Risk (11/08/2023)  Financial Resource Strain: Medium Risk (09/27/2023)   Received from [facility]  Social Connections: Unknown (11/08/2023)  Tobacco Use: Medium Risk (11/07/2023)    Readmission Risk Interventions     No data to display

## 2023-11-21 NOTE — Progress Notes (Signed)
 PROGRESS NOTE   HPI was taken from NP [place]: Stacy Kirk is a 78 y.o. female with medical history significant of heart failure preserved EF, PE on Eliquis , history of ovarian cancer, hypothyroidism, obesity, left hip fracture in June s/p surgical repair, CKD IIIa, COPD with recent hospitalization for exacerbation, left heel ulcer, diabetes mellitus 2, suspected baseline dementia, recent UTI and recurrent urinary retention prompting discharge with Foley catheter in place and recommendation for outpatient follow-up with urology.  No family at bedside during my evaluation of patient.  She was discharged on 8/1 as stated above regarding COPD exacerbation.  Patient was brought to the ED from home with complaints of lower abdominal pain and pressure that started Tuesday night.  Foley catheter in place notable for cloudy urine and sediment in drainage bag.  Husband reported patient had been more confused than baseline.  At presentation she was hemodynamically stable, O2 sat 93% and CBG was 169.  While in the ED she has remained hemodynamically stable.  She was placed on oxygen to maintain sats greater than 95%.  Labs revealed hyponatremia, hypokalemia, stable renal function, mildly elevated anion gap with normal glucose, lipase 58, leukocytosis with differential not obtained.  Urinalysis was abnormal with turbid appearance, large hemoglobin, large leukocytes, many bacteria, greater than 50 WBCs, urine culture was obtained.  Foley catheter was changed out by the ED staff.  CT abdomen and pelvis unremarkable except for mild fecal impaction.  Hospital service has been asked to evaluate the patient for admission.   Upon my evaluation of the patient she was very lethargic and difficult to awaken.  Staff nurse states that she had been given a small dose of IV morphine  because she was very agitated and screaming out in pain    Stacy Kirk  FMW:[id number] DOB: [birth year] DOA: 11/07/2023 PCP: Wendee Lynwood HERO,  NP   Assessment & Plan:   Principal Problem:   Complicated urinary tract infection Active Problems:   Sepsis due to urinary tract infection (HCC)   AMS (altered mental status)  Assessment and Plan: Recurrent UTI: likely secondary to indwelling foley since last admission due to acute urinary retention. Urine cx growing pseudomonas, enterococcus. Completed abx course. (ampicillin  & cipro )  Possible black stool: as per pt's nurse. H&H are trending up. Continue on PPI. Will continue to monitor    Acute metabolic encephalopathy: possible underlying dementia. Re-orient prn. Pt is refusing meds   Blepharitis: continue on topical antibiotic & eye drops   Hx of PE: restart home dose of eliquis  as H&H are trending up.    DM2: HbA1c 5.1. D/c SSI as not needed currently. Diet controlled   COPD: w/o exacerbation. Bronchodilators prn and encourage incentive spirometry     Chronic diastolic CHF: appears compensated. Monitor I/Os. Continue on metoprolol , aldactone . Holding demadex     Hypothyroidism: continue on home dose of levothyroxine     Left hip fracture: in June. PT recs SNF. Still waiting on appeal decision from pt's insurance as per CM    Left heel ulcer: continue w/ wound care. Evaluated by podiatry during previous admission   Constipation: refusing to take lactulose . Dulcolax x 1 ordered.   Obesity: BMI 32.1. Would benefit from weight loss    DVT prophylaxis:  eliquis  Code Status: DNR Family Communication: discussed pt's care w/ pt's husband, Kayla, and answered his questions Disposition Plan: possible d/c to SNF  Status is: Inpatient Remains inpatient appropriate because: waiting on appeal decision from pt's insurance as per CM. Pt has  been refusing meds and care    Level of care: Med-Surg Consultants:    Procedures:  Antimicrobials:    Subjective: Pt says leave me alone.  Objective: Vitals:   11/20/23 2032 11/21/23 0411 11/21/23 0500 11/21/23 0810  BP:  134/82 138/85  (!) 155/75  Pulse: 83 99  75  Resp: 14 16  14   Temp: 98.4 F (36.9 C) 98.4 F (36.9 C)  98.2 F (36.8 C)  TempSrc:      SpO2: 96% 94%  94%  Weight:   81.2 kg   Height:        Intake/Output Summary (Last 24 hours) at 11/21/2023 0919 Last data filed at 11/21/2023 0104 Gross per 24 hour  Intake 360 ml  Output 275 ml  Net 85 ml   Filed Weights   11/19/23 0500 11/20/23 0500 11/21/23 0500  Weight: 79.6 kg 82.6 kg 81.2 kg    Examination: Pt refused PE today    General exam: appears frustrated Respiratory system:  Cardiovascular system:  GI:  Central nervous system:  Psychiatry:     Data Reviewed: I have personally reviewed following labs and imaging studies  CBC: Recent Labs  Lab 11/16/23 0413 11/17/23 0639 11/18/23 0537 11/19/23 0501 11/19/23 1556 11/20/23 0444 11/20/23 1958  WBC 7.3 8.4 6.9 6.3  --  6.9  --   HGB 12.6 11.8* 11.4* 11.5* 12.5 11.8* 11.7*  HCT 38.5 35.9* 34.7* 35.8* 38.9 36.0 35.7*  MCV 86.7 88.0 87.8 88.6  --  87.2  --   PLT 305 296 286 309  --  333  --    Basic Metabolic Panel: Recent Labs  Lab 11/16/23 0413 11/17/23 0639 11/18/23 0537 11/19/23 0501 11/20/23 0444  NA 136 134* 132* 134* 131*  K 3.4* 3.4* 3.6 3.4* 3.1*  CL 98 96* 98 97* 96*  CO2 27 32 27 28 25   GLUCOSE 117* 90 93 88 99  BUN 12 10 10 11 12   CREATININE 0.74 0.80 0.80 0.75 0.86  CALCIUM 9.2 8.8* 8.8* 8.9 9.1   GFR: Estimated Creatinine Clearance: 51.5 mL/min (by C-G formula based on SCr of 0.86 mg/dL). Liver Function Tests: No results for input(s): AST, ALT, ALKPHOS, BILITOT, PROT, ALBUMIN in the last 168 hours.  No results for input(s): LIPASE, AMYLASE in the last 168 hours. No results for input(s): AMMONIA in the last 168 hours. Coagulation Profile: No results for input(s): INR, PROTIME in the last 168 hours. Cardiac Enzymes: No results for input(s): CKTOTAL, CKMB, CKMBINDEX, TROPONINI in the last 168 hours. BNP  (last 3 results) Recent Labs    11/29/22 1028 12/16/22 1130 05/12/23 1407  PROBNP 106.0* 31.0 33.0   HbA1C: No results for input(s): HGBA1C in the last 72 hours. CBG: Recent Labs  Lab 11/14/23 1537 11/15/23 0009 11/15/23 0820 11/16/23 2035 11/18/23 1706  GLUCAP 117* 114* 106* 147* 136*   Lipid Profile: No results for input(s): CHOL, HDL, LDLCALC, TRIG, CHOLHDL, LDLDIRECT in the last 72 hours. Thyroid  Function Tests: No results for input(s): TSH, T4TOTAL, FREET4, T3FREE, THYROIDAB in the last 72 hours. Anemia Panel: No results for input(s): VITAMINB12, FOLATE, FERRITIN, TIBC, IRON , RETICCTPCT in the last 72 hours. Sepsis Labs: No results for input(s): PROCALCITON, LATICACIDVEN in the last 168 hours.  No results found for this or any previous visit (from the past 240 hours).        Radiology Studies: No results found.       Scheduled Meds:  Chlorhexidine  Gluconate Cloth  6 each Topical Daily  collagenase    Topical Daily   ketotifen   1 drop Both Eyes BID   lactulose   20 g Oral Daily   levothyroxine   100 mcg Oral Q0600   metoprolol  succinate  25 mg Oral Daily   multivitamin  1 tablet Oral QHS   pantoprazole  (PROTONIX ) IV  40 mg Intravenous Q12H   scopolamine   1 patch Transdermal Q72H   sodium chloride  flush  3 mL Intravenous Q12H   spironolactone   25 mg Oral Daily   Continuous Infusions:   LOS: 13 days       Anthony CHRISTELLA Pouch, MD [facility] Pager [phone]  If 7PM-7AM, please contact night-coverage [email or web address] 11/21/2023, 9:19 AM '

## 2023-11-21 NOTE — Plan of Care (Signed)

## 2023-11-22 ENCOUNTER — Inpatient Hospital Stay

## 2023-11-22 DIAGNOSIS — N390 Urinary tract infection, site not specified: Secondary | ICD-10-CM | POA: Diagnosis not present

## 2023-11-22 DIAGNOSIS — M1712 Unilateral primary osteoarthritis, left knee: Secondary | ICD-10-CM

## 2023-11-22 DIAGNOSIS — R4182 Altered mental status, unspecified: Secondary | ICD-10-CM

## 2023-11-22 DIAGNOSIS — A419 Sepsis, unspecified organism: Secondary | ICD-10-CM | POA: Diagnosis not present

## 2023-11-22 LAB — CBC
HCT: 35.2 % — ABNORMAL LOW (ref 36.0–46.0)
Hemoglobin: 11.6 g/dL — ABNORMAL LOW (ref 12.0–15.0)
MCH: 28.4 pg (ref 26.0–34.0)
MCHC: 33 g/dL (ref 30.0–36.0)
MCV: 86.3 fL (ref 80.0–100.0)
Platelets: 343 K/uL (ref 150–400)
RBC: 4.08 MIL/uL (ref 3.87–5.11)
RDW: 17.3 % — ABNORMAL HIGH (ref 11.5–15.5)
WBC: 6.7 K/uL (ref 4.0–10.5)
nRBC: 0 % (ref 0.0–0.2)

## 2023-11-22 LAB — BASIC METABOLIC PANEL WITH GFR
Anion gap: 12 (ref 5–15)
BUN: 11 mg/dL (ref 8–23)
CO2: 24 mmol/L (ref 22–32)
Calcium: 9.6 mg/dL (ref 8.9–10.3)
Chloride: 101 mmol/L (ref 98–111)
Creatinine, Ser: 0.77 mg/dL (ref 0.44–1.00)
GFR, Estimated: >60 mL/min (ref >60)
Glucose, Bld: 96 mg/dL (ref 70–99)
Potassium: 3.8 mmol/L (ref 3.5–5.1)
Sodium: 137 mmol/L (ref 135–145)

## 2023-11-22 MED ORDER — POLYETHYLENE GLYCOL 3350 17 G PO PACK
17.0000 g | PACK | Freq: Every day | ORAL | Status: DC
  Administered 2023-11-22: 17 g via ORAL
  Filled 2023-11-22 (×2): qty 1

## 2023-11-22 MED ORDER — SENNOSIDES-DOCUSATE SODIUM 8.6-50 MG PO TABS
2.0000 | ORAL_TABLET | Freq: Two times a day (BID) | ORAL | Status: DC
  Filled 2023-11-22: qty 2

## 2023-11-22 NOTE — Plan of Care (Signed)

## 2023-11-22 NOTE — Progress Notes (Signed)
 Physical Therapy Treatment Patient Details Name: Stacy Kirk MRN: [id number] DOB: 02-03-1946 Today's Date: 11/22/2023   History of Present Illness Stacy Kirk is an 78 y.o. female with PMH that includes: diastolic CHF, PE on Eliquis , HTN, DM, hypothyroid, GI bleeding, CKD, lymphedema, ovarian cancer, lumbar spinal stenosis, left hip fracture surgery in June, recent hospitalization for AMS/shortness of breath found to be in COPD exacerbation, urinary tract infection and urinary retention with indwelling Foley catheter placement, acute metabolic encephalopathy, and chronic foot ulcer.    PT Comments  Patient is making limited progress towards physical therapy goals. Continues to be limited by L foot plantar flexed position and inability to correct or place foot flat on floor to stand. Required modA for bed mobility with use of bed rails and attempted to stand to RW with maxA, however unable to clear buttocks and notable L ankle instability so deferred due to potential harm to L ankle. Patient easily agitated during session and complaining of pain in L knee and foot throughout. Discharge plan remains appropriate and would benefit from <3hours/day of skilled PT treatment, however progress may be limited as patient's cognition can be a barrier.    If plan is discharge home, recommend the following: Two people to help with walking and/or transfers;Two people to help with bathing/dressing/bathroom;Help with stairs or ramp for entrance;Assist for transportation;Assistance with cooking/housework   Can travel by private vehicle     No  Equipment Recommendations  Other (comment) (TBD)    Recommendations for Other Services       Precautions / Restrictions Precautions Precautions: Fall Recall of Precautions/Restrictions: Impaired Restrictions Weight Bearing Restrictions Per Provider Order: No     Mobility  Bed Mobility Overal bed mobility: Needs Assistance Bed Mobility: Supine to  Sit, Sit to Supine     Supine to sit: Mod assist, HOB elevated, Used rails Sit to supine: Max assist        Transfers Overall transfer level: Needs assistance Equipment used: Rolling Larisa Lanius (2 wheels) Transfers: Sit to/from Stand Sit to Stand: Max assist           General transfer comment: maxA to attempt standing, however unable to keep L foot flat on floor and patient with inadequate strength to push through legs to stand. Attempted x 3    Ambulation/Gait                   Stairs             Wheelchair Mobility     Tilt Bed    Modified Rankin (Stroke Patients Only)       Balance Overall balance assessment: Needs assistance Sitting-balance support: Bilateral upper extremity supported Sitting balance-Leahy Scale: Poor                                      Communication Communication Communication: Impaired Factors Affecting Communication: Hearing impaired  Cognition Arousal: Alert Behavior During Therapy: WFL for tasks assessed/performed   PT - Cognitive impairments: No family/caregiver present to determine baseline                       PT - Cognition Comments: easily agitated Following commands: Impaired Following commands impaired: Follows one step commands with increased time    Cueing Cueing Techniques: Verbal cues, Tactile cues, Gestural cues  Exercises      General Comments  Pertinent Vitals/Pain Pain Assessment Pain Assessment: Faces Faces Pain Scale: Hurts even more Pain Location: L knee and L foot Pain Descriptors / Indicators: Discomfort, Grimacing Pain Intervention(s): Limited activity within patient's tolerance, Monitored during session, Repositioned    Home Living                          Prior Function            PT Goals (current goals can now be found in the care plan section) Acute Rehab PT Goals PT Goal Formulation: Patient unable to participate in goal  setting Time For Goal Achievement: 11/23/23 Potential to Achieve Goals: Fair Progress towards PT goals: Not progressing toward goals - comment    Frequency    Min 1X/week      PT Plan      Co-evaluation              AM-PAC PT 6 Clicks Mobility   Outcome Measure  Help needed turning from your back to your side while in a flat bed without using bedrails?: A Lot Help needed moving from lying on your back to sitting on the side of a flat bed without using bedrails?: A Lot Help needed moving to and from a bed to a chair (including a wheelchair)?: Total Help needed standing up from a chair using your arms (e.g., wheelchair or bedside chair)?: Total Help needed to walk in hospital room?: Total Help needed climbing 3-5 steps with a railing? : Total 6 Click Score: 8    End of Session   Activity Tolerance: Patient limited by fatigue Patient left: in bed;with call bell/phone within reach;with bed alarm set Nurse Communication: Mobility status;Other (comment) PT Visit Diagnosis: Other abnormalities of gait and mobility (R26.89);Muscle weakness (generalized) (M62.81);History of falling (Z91.81);Pain Pain - Right/Left: Left Pain - part of body: Hip;Knee;Ankle and joints of foot     Time: 1022-1038 PT Time Calculation (min) (ACUTE ONLY): 16 min  Charges:    $Therapeutic Activity: 8-22 mins PT General Charges $$ ACUTE PT VISIT: 1 Visit                     Maryanne Finder, PT, DPT Physical Therapist - [facility]  [facility]    Kamarian Sahakian A Biance Moncrief 11/22/2023, 10:43 AM

## 2023-11-22 NOTE — TOC Progression Note (Signed)
 Transition of Care [facility]) - Progression Note    Patient Details  Name: Stacy Kirk MRN: [id number] Date of Birth: 02-02-1946  Transition of Care [facility]) CM/SW Contact  Dalia GORMAN Fuse, RN Phone Number: 11/22/2023, 9:44 AM  Clinical Narrative:     TOC received a call from [facility] Nurse, Bobbette [phone], requesting updated PT notes. TOC reached out to PT and they will see the patient this AM. TOC to fax updated PT notes to [phone].   Expected Discharge Plan: Skilled Nursing Facility Barriers to Discharge: Continued Medical Work up, SNF Pending bed offer, English as a second language teacher               Expected Discharge Plan and Services   Discharge Planning Services: CM Consult   Living arrangements for the past 2 months: Single Family Home Expected Discharge Date: 11/19/23                                     Social Drivers of Health (SDOH) Interventions SDOH Screenings   Food Insecurity: No Food Insecurity (11/08/2023)  Recent Concern: Food Insecurity - Food Insecurity Present (09/27/2023)   Received from [facility]  Housing: Low Risk  (11/08/2023)  Transportation Needs: No Transportation Needs (11/08/2023)  Utilities: Not At Risk (11/08/2023)  Financial Resource Strain: Medium Risk (09/27/2023)   Received from [facility]  Social Connections: Unknown (11/08/2023)  Tobacco Use: Medium Risk (11/07/2023)    Readmission Risk Interventions     No data to display

## 2023-11-22 NOTE — Progress Notes (Signed)
 PROGRESS NOTE   HPI was taken from NP [place]: Stacy Kirk is a 78 y.o. female with medical history significant of heart failure preserved EF, PE on Eliquis , history of ovarian cancer, hypothyroidism, obesity, left hip fracture in June s/p surgical repair, CKD IIIa, COPD with recent hospitalization for exacerbation, left heel ulcer, diabetes mellitus 2, suspected baseline dementia, recent UTI and recurrent urinary retention prompting discharge with Foley catheter in place and recommendation for outpatient follow-up with urology.  No family at bedside during my evaluation of patient.  She was discharged on 8/1 as stated above regarding COPD exacerbation.  Patient was brought to the ED from home with complaints of lower abdominal pain and pressure that started Tuesday night.  Foley catheter in place notable for cloudy urine and sediment in drainage bag.  Husband reported patient had been more confused than baseline.  At presentation she was hemodynamically stable, O2 sat 93% and CBG was 169.  While in the ED she has remained hemodynamically stable.  She was placed on oxygen to maintain sats greater than 95%.  Labs revealed hyponatremia, hypokalemia, stable renal function, mildly elevated anion gap with normal glucose, lipase 58, leukocytosis with differential not obtained.  Urinalysis was abnormal with turbid appearance, large hemoglobin, large leukocytes, many bacteria, greater than 50 WBCs, urine culture was obtained.  Foley catheter was changed out by the ED staff.  CT abdomen and pelvis unremarkable except for mild fecal impaction.  Hospital service has been asked to evaluate the patient for admission.   8/27: Patient refused lactulose , left knee x-ray shows severe arthritis  Stacy Kirk  FMW:[id number] DOB: [birth year] DOA: 11/07/2023 PCP: Wendee Lynwood HERO, NP   Assessment & Plan:   Principal Problem:   Complicated urinary tract infection Active Problems:   Sepsis due to urinary tract  infection (HCC)   AMS (altered mental status)  Assessment and Plan: Recurrent UTI: likely secondary to indwelling foley since last admission due to acute urinary retention. Urine cx growing pseudomonas, enterococcus. Completed abx course. (ampicillin  & cipro ).  No need of antibiotics  Possible black stool: as per pt's nurse. H&H are trending up. Continue on PPI. Will continue to monitor    Acute metabolic encephalopathy: possible underlying dementia. Re-orient prn. Pt is refusing meds   Blepharitis: continue on topical antibiotic & eye drops   Hx of PE: restarted home dose of eliquis  as H&H are trending up.    DM2: HbA1c 5.1. D/c SSI as not needed currently. Diet controlled   COPD: w/o exacerbation. Bronchodilators prn and encourage incentive spirometry     Chronic diastolic CHF: appears compensated. Monitor I/Os. Continue on metoprolol , aldactone . Holding demadex     Hypothyroidism: continue on home dose of levothyroxine     Left hip fracture: in June. PT recs SNF. Still waiting on appeal decision from pt's insurance as per CM.  Updated PT note sent out today   Left heel ulcer: continue w/ wound care. Evaluated by podiatry during previous admission   Constipation: refusing to take lactulose .  She has a last documented bowel movement on 8/25.  I have ordered Senokot and MiraLAX  patient has been refusing  Obesity: BMI 32.1. Would benefit from weight loss   Left knee pain: X-ray shows severe DJD   DVT prophylaxis:  eliquis  Code Status: DNR Family Communication: No family at bedside Disposition Plan: possible d/c to SNF versus home with home health/long-term care  Status is: Inpatient Remains inpatient appropriate because: waiting on appeal decision from pt's insurance as per  CM. Pt has been refusing meds and care    Level of care: Med-Surg   Subjective:  She refused lactulose  while I was at bedside.  Pleasantly confused  Objective: Vitals:   11/22/23 0500 11/22/23 0651  11/22/23 0959 11/22/23 1703  BP:  126/76 (!) 154/87 (!) 178/91  Pulse:  (!) 51 (!) 50 84  Resp:  14 16 18   Temp:  98.1 F (36.7 C) 98.2 F (36.8 C) 98.4 F (36.9 C)  TempSrc:  Oral  Oral  SpO2:  93% 95% 95%  Weight: 80.9 kg     Height:        Intake/Output Summary (Last 24 hours) at 11/22/2023 1736 Last data filed at 11/22/2023 0900 Gross per 24 hour  Intake 120 ml  Output --  Net 120 ml   Filed Weights   11/20/23 0500 11/21/23 0500 11/22/23 0500  Weight: 82.6 kg 81.2 kg 80.9 kg    Examination:  78 year old female lying in the bed comfortably without any acute distress Lungs clear to auscultation bilaterally Heart regular rate and rhythm Abdomen soft, benign Neuro alert and awake, pleasantly confused Skin no rash or lesion    Data Reviewed: I have personally reviewed following labs and imaging studies  CBC: Recent Labs  Lab 11/18/23 0537 11/19/23 0501 11/19/23 1556 11/20/23 0444 11/20/23 1958 11/21/23 1157 11/22/23 0459  WBC 6.9 6.3  --  6.9  --  7.7 6.7  HGB 11.4* 11.5* 12.5 11.8* 11.7* 13.8 11.6*  HCT 34.7* 35.8* 38.9 36.0 35.7* 42.6 35.2*  MCV 87.8 88.6  --  87.2  --  87.5 86.3  PLT 286 309  --  333  --  341 343   Basic Metabolic Panel: Recent Labs  Lab 11/18/23 0537 11/19/23 0501 11/20/23 0444 11/21/23 1157 11/22/23 0459  NA 132* 134* 131* 135 137  K 3.6 3.4* 3.1* 4.0 3.8  CL 98 97* 96* 100 101  CO2 27 28 25 26 24   GLUCOSE 93 88 99 134* 96  BUN 10 11 12 12 11   CREATININE 0.80 0.75 0.86 0.99 0.77  CALCIUM 8.8* 8.9 9.1 9.7 9.6    BNP (last 3 results) Recent Labs    11/29/22 1028 12/16/22 1130 05/12/23 1407  PROBNP 106.0* 31.0 33.0   HbA1C: No results for input(s): HGBA1C in the last 72 hours. CBG: Recent Labs  Lab 11/16/23 2035 11/18/23 1706  GLUCAP 147* 136*     Radiology Studies: DG Knee Left Port Result Date: 11/22/2023 CLINICAL DATA:  Knee pain.  Pain 144615 EXAM: PORTABLE LEFT KNEE - 1-2 VIEW COMPARISON:  None  Available. FINDINGS: Two views of the knee demonstrate severe joint space narrowing throughout the knee joint. Diffuse osteophytosis in the knee joint. Probable small joint effusion. Negative for an acute fracture or dislocation. 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 Electronically Signed   By: Juliene Balder M.D.   On: 11/22/2023 12:29         Scheduled Meds:  apixaban   5 mg Oral BID   Chlorhexidine  Gluconate Cloth  6 each Topical Daily   collagenase    Topical Daily   ketotifen   1 drop Both Eyes BID   lactulose   20 g Oral Daily   levothyroxine   100 mcg Oral Q0600   metoprolol  succinate  25 mg Oral Daily   multivitamin  1 tablet Oral QHS   pantoprazole  (PROTONIX ) IV  40 mg Intravenous Q12H   polyethylene glycol  17 g Oral Daily   scopolamine   1 patch  Transdermal Q72H   senna-docusate  2 tablet Oral BID   sodium chloride  flush  3 mL Intravenous Q12H   spironolactone   25 mg Oral Daily   Continuous Infusions:   LOS: 14 days    Time spent 35 minutes   Cresencio Fairly, MD [facility] Pager [phone]  If 7PM-7AM, please contact night-coverage [email or web address] 11/22/2023, 5:36 PM '

## 2023-11-22 NOTE — TOC Progression Note (Signed)
 Transition of Care [facility]) - Progression Note    Patient Details  Name: Stacy Kirk MRN: [id number] Date of Birth: 03/14/1946  [facility] CM/SW Contact  Dalia GORMAN Fuse, RN Phone Number: 11/22/2023, 10:56 AM  Clinical Narrative:     8:45 AM TOC received call from Charlo [phone] at [facility] Updated PT notes are needed to process the expedited appeal. TOC outreached to PT and they plan to see the patient this morning.  10:57 AM TOC received message from PT advising patient is likely more appropriate for LTC and they feel the initial rec for SNF may have been motivated by the family.   TOC faxed updated PT notes to [facility].  TOC spoke with the patient's husband and advised that if the appeal is denied the patient will need to discharge to home or LTC.  Currently the patient receives services from [place] for 6 hours each day. He ws adamant that she only lies in bed. TOC explained that this could be a new baseline and that we can setup services in the home to support.   Her husband was also concerned with her lack of BM. TOC explained that a large BM was documented this morning as well as the prior 2 days.   The husband is also interested in understanding the cost of private pay for [facility]. TOC outreach to the [facility] and she is going to research with the business office and call back.   [employer] appeal is still pending.   Expected Discharge Plan: Skilled Nursing Facility Barriers to Discharge: Continued Medical Work up, SNF Pending bed offer, English as a second language teacher               Expected Discharge Plan and Services   Discharge Planning Services: CM Consult   Living arrangements for the past 2 months: Single Family Home Expected Discharge Date: 11/19/23                                     Social Drivers of Health (SDOH) Interventions SDOH Screenings   Food Insecurity: No Food Insecurity (11/08/2023)  Recent Concern: Food Insecurity -  Food Insecurity Present (09/27/2023)   Received from [facility]  Housing: Low Risk  (11/08/2023)  Transportation Needs: No Transportation Needs (11/08/2023)  Utilities: Not At Risk (11/08/2023)  Financial Resource Strain: Medium Risk (09/27/2023)   Received from [facility]  Social Connections: Unknown (11/08/2023)  Tobacco Use: Medium Risk (11/07/2023)    Readmission Risk Interventions     No data to display

## 2023-11-23 DIAGNOSIS — R1084 Generalized abdominal pain: Secondary | ICD-10-CM

## 2023-11-23 DIAGNOSIS — S72142D Displaced intertrochanteric fracture of left femur, subsequent encounter for closed fracture with routine healing: Secondary | ICD-10-CM | POA: Diagnosis not present

## 2023-11-23 DIAGNOSIS — L89620 Pressure ulcer of left heel, unstageable: Secondary | ICD-10-CM | POA: Diagnosis not present

## 2023-11-23 DIAGNOSIS — E1159 Type 2 diabetes mellitus with other circulatory complications: Secondary | ICD-10-CM | POA: Diagnosis not present

## 2023-11-23 DIAGNOSIS — E039 Hypothyroidism, unspecified: Secondary | ICD-10-CM | POA: Diagnosis not present

## 2023-11-23 DIAGNOSIS — S72002D Fracture of unspecified part of neck of left femur, subsequent encounter for closed fracture with routine healing: Secondary | ICD-10-CM | POA: Diagnosis not present

## 2023-11-23 DIAGNOSIS — M810 Age-related osteoporosis without current pathological fracture: Secondary | ICD-10-CM | POA: Diagnosis not present

## 2023-11-23 DIAGNOSIS — F0390 Unspecified dementia without behavioral disturbance: Secondary | ICD-10-CM | POA: Diagnosis not present

## 2023-11-23 DIAGNOSIS — M48061 Spinal stenosis, lumbar region without neurogenic claudication: Secondary | ICD-10-CM | POA: Diagnosis not present

## 2023-11-23 DIAGNOSIS — L89629 Pressure ulcer of left heel, unspecified stage: Secondary | ICD-10-CM | POA: Diagnosis not present

## 2023-11-23 DIAGNOSIS — S72002A Fracture of unspecified part of neck of left femur, initial encounter for closed fracture: Secondary | ICD-10-CM | POA: Diagnosis not present

## 2023-11-23 DIAGNOSIS — N390 Urinary tract infection, site not specified: Secondary | ICD-10-CM | POA: Diagnosis not present

## 2023-11-23 DIAGNOSIS — E1129 Type 2 diabetes mellitus with other diabetic kidney complication: Secondary | ICD-10-CM | POA: Diagnosis not present

## 2023-11-23 DIAGNOSIS — I5032 Chronic diastolic (congestive) heart failure: Secondary | ICD-10-CM | POA: Diagnosis not present

## 2023-11-23 DIAGNOSIS — G9341 Metabolic encephalopathy: Secondary | ICD-10-CM | POA: Diagnosis not present

## 2023-11-23 DIAGNOSIS — M6281 Muscle weakness (generalized): Secondary | ICD-10-CM | POA: Diagnosis not present

## 2023-11-23 DIAGNOSIS — R2689 Other abnormalities of gait and mobility: Secondary | ICD-10-CM | POA: Diagnosis not present

## 2023-11-23 DIAGNOSIS — M1712 Unilateral primary osteoarthritis, left knee: Secondary | ICD-10-CM | POA: Diagnosis not present

## 2023-11-23 DIAGNOSIS — Z515 Encounter for palliative care: Secondary | ICD-10-CM

## 2023-11-23 DIAGNOSIS — N1831 Chronic kidney disease, stage 3a: Secondary | ICD-10-CM | POA: Diagnosis not present

## 2023-11-23 DIAGNOSIS — R2681 Unsteadiness on feet: Secondary | ICD-10-CM | POA: Diagnosis not present

## 2023-11-23 DIAGNOSIS — D509 Iron deficiency anemia, unspecified: Secondary | ICD-10-CM | POA: Diagnosis not present

## 2023-11-23 DIAGNOSIS — I4891 Unspecified atrial fibrillation: Secondary | ICD-10-CM | POA: Diagnosis not present

## 2023-11-23 DIAGNOSIS — I130 Hypertensive heart and chronic kidney disease with heart failure and stage 1 through stage 4 chronic kidney disease, or unspecified chronic kidney disease: Secondary | ICD-10-CM | POA: Diagnosis not present

## 2023-11-23 DIAGNOSIS — A4151 Sepsis due to Escherichia coli [E. coli]: Secondary | ICD-10-CM | POA: Diagnosis not present

## 2023-11-23 DIAGNOSIS — Z86711 Personal history of pulmonary embolism: Secondary | ICD-10-CM | POA: Diagnosis not present

## 2023-11-23 DIAGNOSIS — R4182 Altered mental status, unspecified: Secondary | ICD-10-CM | POA: Diagnosis not present

## 2023-11-23 DIAGNOSIS — J449 Chronic obstructive pulmonary disease, unspecified: Secondary | ICD-10-CM | POA: Diagnosis not present

## 2023-11-23 LAB — CBC
HCT: 33.3 % — ABNORMAL LOW (ref 36.0–46.0)
Hemoglobin: 10.8 g/dL — ABNORMAL LOW (ref 12.0–15.0)
MCH: 28.3 pg (ref 26.0–34.0)
MCHC: 32.4 g/dL (ref 30.0–36.0)
MCV: 87.4 fL (ref 80.0–100.0)
Platelets: 304 K/uL (ref 150–400)
RBC: 3.81 MIL/uL — ABNORMAL LOW (ref 3.87–5.11)
RDW: 17.1 % — ABNORMAL HIGH (ref 11.5–15.5)
WBC: 6.1 K/uL (ref 4.0–10.5)
nRBC: 0 % (ref 0.0–0.2)

## 2023-11-23 LAB — BASIC METABOLIC PANEL WITH GFR
Anion gap: 7 (ref 5–15)
BUN: 11 mg/dL (ref 8–23)
CO2: 26 mmol/L (ref 22–32)
Calcium: 9.1 mg/dL (ref 8.9–10.3)
Chloride: 101 mmol/L (ref 98–111)
Creatinine, Ser: 0.84 mg/dL (ref 0.44–1.00)
GFR, Estimated: >60 mL/min (ref >60)
Glucose, Bld: 94 mg/dL (ref 70–99)
Potassium: 3.9 mmol/L (ref 3.5–5.1)
Sodium: 134 mmol/L — ABNORMAL LOW (ref 135–145)

## 2023-11-23 NOTE — Consult Note (Signed)
 Consultation Note Date: 11/23/2023 at 1100  Patient Name: Stacy Kirk  DOB: 02-22-1946  MRN: [id number]  Age / Sex: 78 y.o., female  PCP: Stacy Lynwood HERO, NP Referring Physician: Maree Hue, MD  HPI/Patient Profile: 78 y.o. female  with past medical history of heart failure preserved EF, PE on Eliquis , history of ovarian cancer, hypothyroidism, obesity, left hip fracture in June s/p surgical repair, CKD IIIa, COPD with recent hospitalization for exacerbation, left heel ulcer, diabetes mellitus 2, suspected baseline dementia, recent UTI and recurrent urinary retention prompting discharge with Foley catheter in place and recommendation for outpatient follow-up with urology admitted on 11/07/2023 with complaints of lower abdominal pain and pressure.  UA was abnormal and urine culture revealed Pseudomonas and Enterococcus.  Course of antibiotics was completed.  During course of hospitalization patient was treated for acute metabolic encephalopathy, blepharitis, COPD without exacerbation, and left heel ulcer.  PMT was consulted to support patient and family goals of care discussions.  Clinical Assessment and Goals of Care: Extensive chart review completed prior to meeting patient including labs, vital signs, imaging, progress notes, orders, and available advanced directive documents from current and previous encounters. I then met with patient at bedside.  She is sleeping during my visit.  She awakens, but quickly returns to sleep.  No family or friends present during my visit.  After assessing the patient, I spoke with her husband Stacy Kirk over the phone to discuss diagnosis prognosis, GOC, EOL wishes, disposition and options.  I introduced Palliative Medicine as specialized medical care for people living with serious illness. It focuses on providing relief from the symptoms and stress of a serious illness. The  goal is to improve quality of life for both the patient and the family.  We discussed a brief life review of the patient.  Stacy Kirk and Stacy Kirk have been married for over 40 years.  They originally from [place] .  They have 1 special needs son that still lives at home with Stacy Kirk and Stacy Kirk.  Stacy Kirk describes PAD as a steroid but also shares her personality and stubbornness have changed recently.  He endorses that her primary care provider has suggested she has early onset signs of dementia.  He shares this is likely Stacy Kirk.  He asked the course of care if patient's diagnosed with dementia and it progresses.  Discussed dementia as a chronic, progressive, and irreversible disease that is often exacerbated by acute illnesses and hospitalizations.  I shared that there are resources in place to support patient and family should she have dementia and need additional support.  Highlighted to Stacy Kirk that patient has discharge summary in place.  Discussed that the next step is for patient to go to SNF for rehab.  Given the limited amount of time to continue with goals of care discussions, I offered outpatient palliative support.  He shares he is not quite sure he is ready for that step yet.  He is looking forward to patient hopefully participating in rehab and being able to return home  soon.  I attempted to elicit values and goals of care important to the patient. He is looking forward to patient hopefully participating in rehab and being able to return home soon.  However, he shares concerns that she is not happy and therefore perhaps not willing to participate with therapies at rehab.  Discussed that this is the next best step for her in order to hopefully improve her functional status.  He shares that he is hopeful she will be strong enough to return home.  However, he inquires about what options he may have if she is not able to return home.  Discussed that long-term care facilities would be discussed if  patient is unable to return home.  Discussed with Stacy Kirk the importance of continued conversation with family and the medical providers regarding overall plan of care and treatment options, ensuring decisions are within the context of the patient's values and GOCs.  He was appreciative of our discussion.    No further acute palliative needs at this time.  Plan remains for patient to discharge today.  Primary Decision Maker NEXT OF KIN  Physical Exam Vitals reviewed.  Constitutional:      General: She is not in acute distress.    Appearance: She is normal weight.  HENT:     Head: Normocephalic.     Mouth/Throat:     Mouth: Mucous membranes are moist.  Cardiovascular:     Rate and Rhythm: Normal rate.  Pulmonary:     Effort: Pulmonary effort is normal.  Musculoskeletal:     Comments: Generalized weakness  Skin:    General: Skin is warm and dry.  Psychiatric:        Mood and Affect: Mood normal.     Palliative Assessment/Data: 50%     Thank you for this consult. Palliative medicine will continue to follow and assist holistically.   Time Total: 75 minutes  Time spent includes: Detailed review of medical records (labs, imaging, vital signs), medically appropriate exam (mental status, respiratory, cardiac, skin), discussed with treatment team, counseling and educating patient, family and staff, documenting clinical information, medication management and coordination of care.  Signed by: Stacy Gunner, DNP, FNP-BC Palliative Medicine   Please contact Palliative Medicine Team providers via [facility] for questions and concerns.

## 2023-11-23 NOTE — Discharge Summary (Signed)
 Physician Discharge Summary   Patient: Stacy Kirk MRN: [id number] DOB: May 19, 1945  Admit date:     11/07/2023  Discharge date: 11/23/23  Discharge Physician: Cresencio Fairly   PCP: Wendee Lynwood HERO, NP   Recommendations at discharge:    F/up with outpt providers as requested Palliative Care eval while at the facility  Discharge Diagnoses: Principal Problem:   Complicated urinary tract infection Active Problems:   Sepsis due to urinary tract infection (HCC)   AMS (altered mental status)  Hospital Course: Assessment and Plan:  78 y.o. female with medical history significant of heart failure preserved EF, PE on Eliquis , history of ovarian cancer, hypothyroidism, obesity, left hip fracture in June s/p surgical repair, CKD IIIa, COPD with recent hospitalization for exacerbation, left heel ulcer, diabetes mellitus 2, suspected baseline dementia, recent UTI and recurrent urinary retention prompting discharge with Foley catheter in place and recommendation for outpatient follow-up with urology.  No family at bedside during my evaluation of patient.  She was discharged on 8/1 as stated above regarding COPD exacerbation.  Patient was brought to the ED from home with complaints of lower abdominal pain and pressure that started Tuesday night.  Foley catheter in place notable for cloudy urine and sediment in drainage bag.  Husband reported patient had been more confused than baseline.  At presentation she was hemodynamically stable, O2 sat 93% and CBG was 169.  While in the ED she has remained hemodynamically stable.  She was placed on oxygen to maintain sats greater than 95%.  Labs revealed hyponatremia, hypokalemia, stable renal function, mildly elevated anion gap with normal glucose, lipase 58, leukocytosis with differential not obtained.  Urinalysis was abnormal with turbid appearance, large hemoglobin, large leukocytes, many bacteria, greater than 50 WBCs, urine culture was obtained.  Foley catheter  was changed out by the ED staff.  CT abdomen and pelvis unremarkable except for mild fecal impaction.  Hospital service has been asked to evaluate the patient for admission.   8/27: Patient refused lactulose , left knee x-ray shows severe arthritis   Recurrent UTI: likely secondary to indwelling foley since last admission due to acute urinary retention. Urine cx growing pseudomonas, enterococcus. Completed abx course. (ampicillin  & cipro ).  No need of antibiotics   Possible black stool: as per pt's nurse. H&H are trending up. Continue on PPI.    Acute metabolic encephalopathy: possible underlying dementia. Re-orient prn. Pt is refusing meds   Blepharitis: continue on topical antibiotic & eye drops   Hx of PE: restarted home dose of eliquis  as H&H are trending up.    DM2: HbA1c 5.1. Diet controlled    COPD: w/o exacerbation. Bronchodilators prn and encourage incentive spirometry     Chronic diastolic CHF: appears compensated.    Hypothyroidism: continue on home dose of levothyroxine     H/o Left hip fracture: going for SNF   Left heel ulcer: continue w/ wound care. Evaluated by podiatry during previous admission   Constipation: resolved now. refusing to take meds    Obesity: BMI 32.1. Would benefit from weight loss    Left knee pain: X-ray shows severe DJD        Disposition: Skilled nursing facility with Palliative care to follow Diet recommendation:  Discharge Diet Orders (From admission, onward)     Start     Ordered   11/23/23 0000  Diet - low sodium heart healthy        11/23/23 1032           Carb  modified diet DISCHARGE MEDICATION: Allergies as of 11/23/2023       Reactions   Erythromycin  Diarrhea, Other (See Comments)   Tears her stomach up. Major digestive upset.   Tramadol Shortness Of Breath, Other (See Comments)   Felt like lungs filled up; unable to lay down   Albumin (human) Other (See Comments)   Pt unsure of reaction   Aspirin  Itching, Other  (See Comments)   Caused lethargy, also   Codeine Itching, Other (See Comments)   Cause lethargy, also   Cortisone Other (See Comments)   NO STEROID INJECTIONS; headaches   Linzess  [linaclotide ] Diarrhea   Propoxyphene Other (See Comments)   Terrible reaction; headaches   Simvastatin Itching, Other (See Comments)   Severe muscle aches   Triamcinolone Acetonide Other (See Comments)   headache        Medication List     STOP taking these medications    cetirizine  10 MG tablet Commonly known as: ZYRTEC    lidocaine  5 % Commonly known as: LIDODERM    methocarbamol  500 MG tablet Commonly known as: ROBAXIN    polyethylene glycol 17 g packet Commonly known as: MIRALAX  / GLYCOLAX        TAKE these medications    acetaminophen  500 MG tablet Commonly known as: TYLENOL  Take 500 mg by mouth every 6 (six) hours as needed (FOR PAIN.).   collagenase  250 UNIT/GM ointment Commonly known as: SANTYL  Apply topically daily.   Eliquis  5 MG Tabs tablet Generic drug: apixaban  TAKE 1 TABLET BY MOUTH TWICE A DAY   ferrous sulfate  325 (65 FE) MG tablet TAKE 325 MG BY MOUTH DAILY.   lactose free nutrition Liqd Take 237 mLs by mouth 2 (two) times daily between meals.   levothyroxine  100 MCG tablet Commonly known as: SYNTHROID  TAKE 1 TABLET BY MOUTH EVERY DAY   metoprolol  succinate 25 MG 24 hr tablet Commonly known as: TOPROL -XL Take 1 tablet (25 mg total) by mouth daily.   multivitamin Tabs tablet Take 1 tablet by mouth at bedtime.   pantoprazole  40 MG tablet Commonly known as: PROTONIX  TAKE 1 TABLET BY MOUTH EVERY DAY   senna-docusate 8.6-50 MG tablet Commonly known as: Senokot-S Take 2 tablets by mouth 2 (two) times daily as needed for mild constipation.   spironolactone  25 MG tablet Commonly known as: ALDACTONE  Take 1 tablet (25 mg total) by mouth daily. Home med.   torsemide  20 MG tablet Commonly known as: DEMADEX  Take 2 tablets (40 mg total) by mouth daily. Home  med.               Durable Medical Equipment  (From admission, onward)           Start     Ordered   11/22/23 1054  For home use only DME Hospital bed  Once       Question Answer Comment  Length of Need Lifetime   Bed type Semi-electric      11/22/23 1053              Discharge Care Instructions  (From admission, onward)           Start     Ordered   11/23/23 0000  Discharge wound care:       Comments: As above   11/23/23 1032   11/19/23 0000  Discharge wound care:       Comments: Wound care  Daily      Apply Santyl  to left heel wound Q day, then cover with moist  gauze and dry 4X4 and kerlex   11/19/23 0831            Follow-up Information     Wendee Lynwood HERO, NP Follow up.   Specialties: Nurse Practitioner, Family Medicine Why: hospital follow up Contact information: [address] [place] [address] [phone]                Discharge Exam: Fredricka Weights   11/21/23 0500 11/22/23 0500 11/23/23 0500  Weight: 81.2 kg 80.9 kg 47.40 kg   78 year old female lying in the bed comfortably without any acute distress Lungs clear to auscultation bilaterally Heart regular rate and rhythm Abdomen soft, benign Neuro alert and awake, pleasantly confused Skin no rash or lesion  Condition at discharge: good  The results of significant diagnostics from this hospitalization (including imaging, microbiology, ancillary and laboratory) are listed below for reference.   Imaging Studies: DG Knee Left Port Result Date: 11/22/2023 CLINICAL DATA:  Knee pain.  Pain 144615 EXAM: PORTABLE LEFT KNEE - 1-2 VIEW COMPARISON:  None Available. FINDINGS: Two views of the knee demonstrate severe joint space narrowing throughout the knee joint. Diffuse osteophytosis in the knee joint. Probable small joint effusion. Negative for an acute fracture or dislocation. 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 Electronically Signed   By: Juliene Balder M.D.    On: 11/22/2023 12:29   DG Abd Portable 1V Result Date: 11/15/2023 CLINICAL DATA:  13914 Abdominal distention 13914 EXAM: PORTABLE ABDOMEN - 1 VIEW COMPARISON:  October 24, 2023 FINDINGS: Nonobstructive bowel gas pattern. Large volume fecal loading throughout the entire colon. No pneumoperitoneum. No organomegaly or radiopaque calculi. No acute fracture or destructive lesion. The lung bases are clear.Diffuse osteopenia. Multilevel degenerative disc disease of the spine. Lumbar and left femoral orthopedic hardware. IMPRESSION: Nonobstructive bowel gas pattern. Large volume fecal loading throughout the colon, worrisome for constipation. Electronically Signed   By: Rogelia Myers M.D.   On: 11/15/2023 15:47   DG Chest 2 View Result Date: 11/08/2023 CLINICAL DATA:  Lower abdominal pain and altered mental status. EXAM: CHEST - 2 VIEW COMPARISON:  October 21, 2023 FINDINGS: The cardiac silhouette is enlarged and unchanged in size. There is marked severity calcification of the aortic arch. Low lung volumes are noted. Mild left infrahilar scarring and/or atelectasis is seen. No pleural effusion or pneumothorax is identified. Multilevel degenerative changes seen throughout the thoracic spine. IMPRESSION: Low lung volumes with mild left infrahilar scarring and/or atelectasis. Electronically Signed   By: Suzen Dials M.D.   On: 11/08/2023 19:28   CT HEAD WO CONTRAST ( ) Result Date: 11/08/2023 CLINICAL DATA:  Altered mental status. EXAM: CT HEAD WITHOUT CONTRAST TECHNIQUE: Contiguous axial images were obtained from the base of the skull through the vertex without intravenous contrast. RADIATION DOSE REDUCTION: This exam was performed according to the departmental dose-optimization program which includes automated exposure control, adjustment of the mA and/or kV according to patient size and/or use of iterative reconstruction technique. COMPARISON:  October 19, 2023 FINDINGS: Brain: There is generalized cerebral atrophy  with widening of the extra-axial spaces and ventricular dilatation. There are areas of decreased attenuation within the white matter tracts of the supratentorial brain, consistent with microvascular disease changes. A small, chronic right basal ganglia lacunar infarct is seen. Vascular: Mild bilateral cavernous carotid artery calcification is noted. Skull: Normal. Negative for fracture or focal lesion. Sinuses/Orbits: No acute finding. Other: None. IMPRESSION: 1. Generalized cerebral atrophy and microvascular disease changes of the supratentorial brain. 2.  Small, chronic right basal ganglia lacunar infarct. 3. No acute intracranial abnormality. Electronically Signed   By: Suzen Dials M.D.   On: 11/08/2023 19:26   CT ABDOMEN PELVIS W CONTRAST Result Date: 11/08/2023 CLINICAL DATA:  Acute abdominal pain EXAM: CT ABDOMEN AND PELVIS WITH CONTRAST TECHNIQUE: Multidetector CT imaging of the abdomen and pelvis was performed using the standard protocol following bolus administration of intravenous contrast. RADIATION DOSE REDUCTION: This exam was performed according to the departmental dose-optimization program which includes automated exposure control, adjustment of the mA and/or kV according to patient size and/or use of iterative reconstruction technique. CONTRAST:  80mL OMNIPAQUE  IOHEXOL  300 MG/ML  SOLN COMPARISON:  None Available. FINDINGS: Lower chest: Mild basilar atelectasis is noted. Hepatobiliary: No focal liver abnormality is seen. Status post cholecystectomy. No biliary dilatation. Pancreas: Unremarkable. No pancreatic ductal dilatation or surrounding inflammatory changes. Spleen: Normal in size without focal abnormality. Adrenals/Urinary Tract: Adrenal glands show a stable right adrenal lesion most consistent with adenoma. No follow-up is recommended. Kidneys demonstrate a normal enhancement pattern bilaterally. Prominent extrarenal pelvis is again noted on the right. No obstructive changes are seen.  The bladder is decompressed by Foley catheter. Stomach/Bowel: Considerable fecal material is noted within the rectal vault consistent with mild rectal impaction. No colitis is seen. Scattered diverticular change of the colon is noted without diverticulitis. The appendix has been surgically removed. Small bowel and stomach are within normal limits. Vascular/Lymphatic: Aortic atherosclerosis. No enlarged abdominal or pelvic lymph nodes. Reproductive: Status post hysterectomy. No adnexal masses. Other: No abdominal wall hernia or abnormality. No abdominopelvic ascites. Musculoskeletal: Postsurgical changes in the left hip are noted. Degenerative changes of lumbar spine are seen. Changes of prior fusion at L4-5 are seen. Chronic L2 compression deformity is noted. IMPRESSION: Changes consistent with mild fecal impaction. Stable right adrenal adenoma.  No follow-up is recommended. Diverticulosis without diverticulitis. Electronically Signed   By: Oneil Devonshire M.D.   On: 11/08/2023 01:21    Microbiology: Results for orders placed or performed during the hospital encounter of 11/07/23  Urine Culture (for pregnant, neutropenic or urologic patients or patients with an indwelling urinary catheter)     Status: Abnormal   Collection Time: 11/08/23  1:12 AM   Specimen: Urine, Catheterized  Result Value Ref Range Status   Specimen Description   Final    URINE, CATHETERIZED Performed at [facility], [address] [place], [place] [address]    Special Requests   Final    NONE Performed at [facility], [address], [place], [place] [address]    Culture (A)  Final    >=100,000 COLONIES/mL PSEUDOMONAS AERUGINOSA >=100,000 COLONIES/mL ENTEROCOCCUS FAECALIS    Report Status 11/11/2023 FINAL  Final   Organism ID, Bacteria PSEUDOMONAS AERUGINOSA (A)  Final   Organism ID, Bacteria ENTEROCOCCUS FAECALIS (A)  Final      Susceptibility   Enterococcus faecalis - MIC*    AMPICILLIN  <=2  SENSITIVE Sensitive     NITROFURANTOIN <=16 SENSITIVE Sensitive     VANCOMYCIN  1 SENSITIVE Sensitive     * >=100,000 COLONIES/mL ENTEROCOCCUS FAECALIS   Pseudomonas aeruginosa - MIC*    MEROPENEM <=0.25 SENSITIVE Sensitive     CIPROFLOXACIN  0.25 SENSITIVE Sensitive     IMIPENEM 2 SENSITIVE Sensitive     PIP/TAZO Value in next row Sensitive ug/mL     16 SENSITIVEThis is a modified FDA-approved test that has been validated and its performance characteristics determined by the reporting laboratory.  This laboratory is  certified under the Clinical Laboratory Improvement Amendments CLIA as qualified to perform high complexity clinical laboratory testing.    CEFTAZIDIME/AVIBACTAM Value in next row Sensitive ug/mL     16 SENSITIVEThis is a modified FDA-approved test that has been validated and its performance characteristics determined by the reporting laboratory.  This laboratory is certified under the Clinical Laboratory Improvement Amendments CLIA as qualified to perform high complexity clinical laboratory testing.    CEFTOLOZANE/TAZOBACTAM Value in next row Sensitive ug/mL     16 SENSITIVEThis is a modified FDA-approved test that has been validated and its performance characteristics determined by the reporting laboratory.  This laboratory is certified under the Clinical Laboratory Improvement Amendments CLIA as qualified to perform high complexity clinical laboratory testing.    TOBRAMYCIN Value in next row Sensitive      16 SENSITIVEThis is a modified FDA-approved test that has been validated and its performance characteristics determined by the reporting laboratory.  This laboratory is certified under the Clinical Laboratory Improvement Amendments CLIA as qualified to perform high complexity clinical laboratory testing.    CEFTAZIDIME Value in next row Sensitive      16 SENSITIVEThis is a modified FDA-approved test that has been validated and its performance characteristics determined by the  reporting laboratory.  This laboratory is certified under the Clinical Laboratory Improvement Amendments CLIA as qualified to perform high complexity clinical laboratory testing.    * >=100,000 COLONIES/mL PSEUDOMONAS AERUGINOSA  Culture, blood (Routine X 2) w Reflex to ID Panel     Status: None   Collection Time: 11/08/23 10:21 AM   Specimen: BLOOD  Result Value Ref Range Status   Specimen Description BLOOD LEFT ANTECUBITAL  Final   Special Requests   Final    BOTTLES DRAWN AEROBIC AND ANAEROBIC Blood Culture adequate volume   Culture   Final    NO GROWTH 5 DAYS Performed at [facility], [address] [place], [place] [address]    Report Status 11/13/2023 FINAL  Final  Culture, blood (Routine X 2) w Reflex to ID Panel     Status: None   Collection Time: 11/08/23 10:21 AM   Specimen: BLOOD  Result Value Ref Range Status   Specimen Description BLOOD BLOOD LEFT HAND  Final   Special Requests   Final    BOTTLES DRAWN AEROBIC ONLY Blood Culture results may not be optimal due to an inadequate volume of blood received in culture bottles   Culture   Final    NO GROWTH 5 DAYS Performed at [facility], [address], [place], [place] [address]    Report Status 11/13/2023 FINAL  Final    Labs: CBC: Recent Labs  Lab 11/19/23 0501 11/19/23 1556 11/20/23 0444 11/20/23 1958 11/21/23 1157 11/22/23 0459 11/23/23 0539  WBC 6.3  --  6.9  --  7.7 6.7 6.1  HGB 11.5*   < > 11.8* 11.7* 13.8 11.6* 10.8*  HCT 35.8*   < > 36.0 35.7* 42.6 35.2* 33.3*  MCV 88.6  --  87.2  --  87.5 86.3 87.4  PLT 309  --  333  --  341 343 304   < > = values in this interval not displayed.   Basic Metabolic Panel: Recent Labs  Lab 11/19/23 0501 11/20/23 0444 11/21/23 1157 11/22/23 0459 11/23/23 0539  NA 134* 131* 135 137 134*  K 3.4* 3.1* 4.0 3.8 3.9  CL 97* 96* 100 101 101  CO2 28 25 26 24 26   GLUCOSE 88 99  134* 96 94  BUN 11 12 12 11 11   CREATININE 0.75 0.86 0.99 0.77 0.84   CALCIUM 8.9 9.1 9.7 9.6 9.1   Liver Function Tests: No results for input(s): AST, ALT, ALKPHOS, BILITOT, PROT, ALBUMIN in the last 168 hours. CBG: Recent Labs  Lab 11/16/23 2035 11/18/23 1706  GLUCAP 147* 136*    Discharge time spent: greater than 30 minutes.  Signed: Cresencio Fairly, MD [facility] 11/23/2023

## 2023-11-23 NOTE — Plan of Care (Signed)

## 2023-11-23 NOTE — TOC Transition Note (Signed)
 Transition of Care [facility]) - Discharge Note   Patient Details  Name: Stacy Kirk MRN: [id number] Date of Birth: 30-Jul-1945  [facility]) CM/SW Contact:  Dalia GORMAN Fuse, RN Phone Number: 11/23/2023, 11:13 AM   Clinical Narrative:    Patient is medically clear to discharge to [facility] for STR. [facility] will transport. The patient and husband are in agreement.   Final next level of care: Skilled Nursing Facility Barriers to Discharge: Barriers Resolved   Patient Goals and CMS Choice            Discharge Placement              Patient chooses bed at: [facility] Patient to be transferred to facility by: [facility] Name of family member notified: Mr. Gosdin Patient and family notified of of transfer: 11/23/23  Discharge Plan and Services Additional resources added to the After Visit Summary for     Discharge Planning Services: CM Consult                                 Social Drivers of Health (SDOH) Interventions SDOH Screenings   Food Insecurity: No Food Insecurity (11/08/2023)  Recent Concern: Food Insecurity - Food Insecurity Present (09/27/2023)   Received from [facility]  Housing: Low Risk  (11/08/2023)  Transportation Needs: No Transportation Needs (11/08/2023)  Utilities: Not At Risk (11/08/2023)  Financial Resource Strain: Medium Risk (09/27/2023)   Received from [facility]  Social Connections: Unknown (11/08/2023)  Tobacco Use: Medium Risk (11/07/2023)     Readmission Risk Interventions     No data to display

## 2023-11-23 NOTE — TOC Progression Note (Signed)
 Transition of Care [facility]) - Progression Note    Patient Details  Name: RENATHA ROSEN MRN: [id number] Date of Birth: [birth year]  Transition [facility]) CM/SW Contact  Dalia GORMAN Fuse, RN Phone Number: 11/23/2023, 9:43 AM  Clinical Narrative:    TOC outreached to [facility] [phone], appeal has been overturned. Approval # [id number] Valid 8/27- 9/1 Call Reference # [id number]  TOC spoke with the [facility] at [facility] and provided the auth number. They can take her today.   Expected Discharge Plan: Skilled Nursing Facility Barriers to Discharge: Continued Medical Work up, SNF Pending bed offer, English as a second language teacher               Expected Discharge Plan and Services   Discharge Planning Services: CM Consult   Living arrangements for the past 2 months: Single Family Home Expected Discharge Date: 11/19/23                                     Social Drivers of Health (SDOH) Interventions SDOH Screenings   Food Insecurity: No Food Insecurity (11/08/2023)  Recent Concern: Food Insecurity - Food Insecurity Present (09/27/2023)   Received from [facility]  Housing: Low Risk  (11/08/2023)  Transportation Needs: No Transportation Needs (11/08/2023)  Utilities: Not At Risk (11/08/2023)  Financial Resource Strain: Medium Risk (09/27/2023)   Received from [facility]  Social Connections: Unknown (11/08/2023)  Tobacco Use: Medium Risk (11/07/2023)    Readmission Risk Interventions     No data to display

## 2023-11-23 NOTE — Progress Notes (Addendum)
 Patient refused oral meds this morning secondary to nausea.  Patient also refused for left heel to be elevated off of bed.  Educated on importance of elevating her foot to aid in healing of foot wound and prevent new wounds. MD notified.

## 2023-11-24 DIAGNOSIS — I509 Heart failure, unspecified: Secondary | ICD-10-CM | POA: Diagnosis not present

## 2023-11-24 DIAGNOSIS — I2699 Other pulmonary embolism without acute cor pulmonale: Secondary | ICD-10-CM | POA: Diagnosis not present

## 2023-11-24 DIAGNOSIS — L89629 Pressure ulcer of left heel, unspecified stage: Secondary | ICD-10-CM | POA: Diagnosis not present

## 2023-11-24 DIAGNOSIS — M25562 Pain in left knee: Secondary | ICD-10-CM | POA: Diagnosis not present

## 2023-11-24 DIAGNOSIS — T83511A Infection and inflammatory reaction due to indwelling urethral catheter, initial encounter: Secondary | ICD-10-CM | POA: Diagnosis not present

## 2023-11-24 DIAGNOSIS — G9341 Metabolic encephalopathy: Secondary | ICD-10-CM | POA: Diagnosis not present

## 2023-11-24 DIAGNOSIS — J449 Chronic obstructive pulmonary disease, unspecified: Secondary | ICD-10-CM | POA: Diagnosis not present

## 2023-11-24 DIAGNOSIS — M1712 Unilateral primary osteoarthritis, left knee: Secondary | ICD-10-CM | POA: Diagnosis not present

## 2023-11-24 DIAGNOSIS — F0390 Unspecified dementia without behavioral disturbance: Secondary | ICD-10-CM | POA: Diagnosis not present

## 2023-11-26 DIAGNOSIS — R109 Unspecified abdominal pain: Secondary | ICD-10-CM | POA: Diagnosis not present

## 2023-11-28 ENCOUNTER — Ambulatory Visit: Admitting: Physician Assistant

## 2023-11-28 DIAGNOSIS — I509 Heart failure, unspecified: Secondary | ICD-10-CM | POA: Diagnosis not present

## 2023-11-28 DIAGNOSIS — T83511A Infection and inflammatory reaction due to indwelling urethral catheter, initial encounter: Secondary | ICD-10-CM | POA: Diagnosis not present

## 2023-11-28 DIAGNOSIS — Z86711 Personal history of pulmonary embolism: Secondary | ICD-10-CM | POA: Diagnosis not present

## 2023-11-28 DIAGNOSIS — F0390 Unspecified dementia without behavioral disturbance: Secondary | ICD-10-CM | POA: Diagnosis not present

## 2023-11-28 DIAGNOSIS — L89620 Pressure ulcer of left heel, unstageable: Secondary | ICD-10-CM | POA: Diagnosis not present

## 2023-11-28 DIAGNOSIS — I4891 Unspecified atrial fibrillation: Secondary | ICD-10-CM | POA: Diagnosis not present

## 2023-11-28 DIAGNOSIS — R2689 Other abnormalities of gait and mobility: Secondary | ICD-10-CM | POA: Diagnosis not present

## 2023-11-28 DIAGNOSIS — I5032 Chronic diastolic (congestive) heart failure: Secondary | ICD-10-CM | POA: Diagnosis not present

## 2023-11-28 DIAGNOSIS — N390 Urinary tract infection, site not specified: Secondary | ICD-10-CM | POA: Diagnosis not present

## 2023-11-28 DIAGNOSIS — K5900 Constipation, unspecified: Secondary | ICD-10-CM | POA: Diagnosis not present

## 2023-11-28 DIAGNOSIS — M6281 Muscle weakness (generalized): Secondary | ICD-10-CM | POA: Diagnosis not present

## 2023-11-28 DIAGNOSIS — L97421 Non-pressure chronic ulcer of left heel and midfoot limited to breakdown of skin: Secondary | ICD-10-CM | POA: Diagnosis not present

## 2023-11-28 DIAGNOSIS — R197 Diarrhea, unspecified: Secondary | ICD-10-CM | POA: Diagnosis not present

## 2023-11-28 DIAGNOSIS — J449 Chronic obstructive pulmonary disease, unspecified: Secondary | ICD-10-CM | POA: Diagnosis not present

## 2023-11-30 DIAGNOSIS — F0390 Unspecified dementia without behavioral disturbance: Secondary | ICD-10-CM | POA: Diagnosis not present

## 2023-11-30 DIAGNOSIS — Z86711 Personal history of pulmonary embolism: Secondary | ICD-10-CM | POA: Diagnosis not present

## 2023-11-30 DIAGNOSIS — J449 Chronic obstructive pulmonary disease, unspecified: Secondary | ICD-10-CM | POA: Diagnosis not present

## 2023-11-30 DIAGNOSIS — N390 Urinary tract infection, site not specified: Secondary | ICD-10-CM | POA: Diagnosis not present

## 2023-12-01 DIAGNOSIS — R2689 Other abnormalities of gait and mobility: Secondary | ICD-10-CM | POA: Diagnosis not present

## 2023-12-01 DIAGNOSIS — I5032 Chronic diastolic (congestive) heart failure: Secondary | ICD-10-CM | POA: Diagnosis not present

## 2023-12-01 DIAGNOSIS — J449 Chronic obstructive pulmonary disease, unspecified: Secondary | ICD-10-CM | POA: Diagnosis not present

## 2023-12-01 DIAGNOSIS — I4891 Unspecified atrial fibrillation: Secondary | ICD-10-CM | POA: Diagnosis not present

## 2023-12-01 DIAGNOSIS — N390 Urinary tract infection, site not specified: Secondary | ICD-10-CM | POA: Diagnosis not present

## 2023-12-01 DIAGNOSIS — M6281 Muscle weakness (generalized): Secondary | ICD-10-CM | POA: Diagnosis not present

## 2023-12-01 DIAGNOSIS — L89620 Pressure ulcer of left heel, unstageable: Secondary | ICD-10-CM | POA: Diagnosis not present

## 2023-12-05 DIAGNOSIS — E039 Hypothyroidism, unspecified: Secondary | ICD-10-CM | POA: Diagnosis not present

## 2023-12-05 DIAGNOSIS — L89620 Pressure ulcer of left heel, unstageable: Secondary | ICD-10-CM | POA: Diagnosis not present

## 2023-12-05 DIAGNOSIS — M810 Age-related osteoporosis without current pathological fracture: Secondary | ICD-10-CM | POA: Diagnosis not present

## 2023-12-05 DIAGNOSIS — R2689 Other abnormalities of gait and mobility: Secondary | ICD-10-CM | POA: Diagnosis not present

## 2023-12-05 DIAGNOSIS — M6281 Muscle weakness (generalized): Secondary | ICD-10-CM | POA: Diagnosis not present

## 2023-12-05 DIAGNOSIS — I4891 Unspecified atrial fibrillation: Secondary | ICD-10-CM | POA: Diagnosis not present

## 2023-12-05 DIAGNOSIS — E1159 Type 2 diabetes mellitus with other circulatory complications: Secondary | ICD-10-CM | POA: Diagnosis not present

## 2023-12-05 DIAGNOSIS — J449 Chronic obstructive pulmonary disease, unspecified: Secondary | ICD-10-CM | POA: Diagnosis not present

## 2023-12-05 DIAGNOSIS — I130 Hypertensive heart and chronic kidney disease with heart failure and stage 1 through stage 4 chronic kidney disease, or unspecified chronic kidney disease: Secondary | ICD-10-CM | POA: Diagnosis not present

## 2023-12-05 DIAGNOSIS — L89629 Pressure ulcer of left heel, unspecified stage: Secondary | ICD-10-CM | POA: Diagnosis not present

## 2023-12-05 DIAGNOSIS — I5032 Chronic diastolic (congestive) heart failure: Secondary | ICD-10-CM | POA: Diagnosis not present

## 2023-12-05 DIAGNOSIS — N390 Urinary tract infection, site not specified: Secondary | ICD-10-CM | POA: Diagnosis not present

## 2023-12-05 DIAGNOSIS — N1831 Chronic kidney disease, stage 3a: Secondary | ICD-10-CM | POA: Diagnosis not present

## 2023-12-06 DIAGNOSIS — Z86711 Personal history of pulmonary embolism: Secondary | ICD-10-CM | POA: Diagnosis not present

## 2023-12-06 DIAGNOSIS — F0390 Unspecified dementia without behavioral disturbance: Secondary | ICD-10-CM | POA: Diagnosis not present

## 2023-12-06 DIAGNOSIS — N390 Urinary tract infection, site not specified: Secondary | ICD-10-CM | POA: Diagnosis not present

## 2023-12-06 DIAGNOSIS — J449 Chronic obstructive pulmonary disease, unspecified: Secondary | ICD-10-CM | POA: Diagnosis not present

## 2023-12-08 DIAGNOSIS — I4891 Unspecified atrial fibrillation: Secondary | ICD-10-CM | POA: Diagnosis not present

## 2023-12-08 DIAGNOSIS — J449 Chronic obstructive pulmonary disease, unspecified: Secondary | ICD-10-CM | POA: Diagnosis not present

## 2023-12-08 DIAGNOSIS — N390 Urinary tract infection, site not specified: Secondary | ICD-10-CM | POA: Diagnosis not present

## 2023-12-08 DIAGNOSIS — R2689 Other abnormalities of gait and mobility: Secondary | ICD-10-CM | POA: Diagnosis not present

## 2023-12-08 DIAGNOSIS — M6281 Muscle weakness (generalized): Secondary | ICD-10-CM | POA: Diagnosis not present

## 2023-12-08 DIAGNOSIS — L89620 Pressure ulcer of left heel, unstageable: Secondary | ICD-10-CM | POA: Diagnosis not present

## 2023-12-08 DIAGNOSIS — I5032 Chronic diastolic (congestive) heart failure: Secondary | ICD-10-CM | POA: Diagnosis not present

## 2023-12-08 DIAGNOSIS — Z86711 Personal history of pulmonary embolism: Secondary | ICD-10-CM | POA: Diagnosis not present

## 2023-12-08 DIAGNOSIS — F0390 Unspecified dementia without behavioral disturbance: Secondary | ICD-10-CM | POA: Diagnosis not present

## 2023-12-11 DIAGNOSIS — N390 Urinary tract infection, site not specified: Secondary | ICD-10-CM | POA: Diagnosis not present

## 2023-12-11 DIAGNOSIS — F0390 Unspecified dementia without behavioral disturbance: Secondary | ICD-10-CM | POA: Diagnosis not present

## 2023-12-11 DIAGNOSIS — Z86711 Personal history of pulmonary embolism: Secondary | ICD-10-CM | POA: Diagnosis not present

## 2023-12-11 DIAGNOSIS — J449 Chronic obstructive pulmonary disease, unspecified: Secondary | ICD-10-CM | POA: Diagnosis not present

## 2023-12-12 DIAGNOSIS — L89620 Pressure ulcer of left heel, unstageable: Secondary | ICD-10-CM | POA: Diagnosis not present

## 2023-12-12 DIAGNOSIS — N390 Urinary tract infection, site not specified: Secondary | ICD-10-CM | POA: Diagnosis not present

## 2023-12-12 DIAGNOSIS — M6281 Muscle weakness (generalized): Secondary | ICD-10-CM | POA: Diagnosis not present

## 2023-12-12 DIAGNOSIS — R2689 Other abnormalities of gait and mobility: Secondary | ICD-10-CM | POA: Diagnosis not present

## 2023-12-12 DIAGNOSIS — I5032 Chronic diastolic (congestive) heart failure: Secondary | ICD-10-CM | POA: Diagnosis not present

## 2023-12-12 DIAGNOSIS — I4891 Unspecified atrial fibrillation: Secondary | ICD-10-CM | POA: Diagnosis not present

## 2023-12-12 DIAGNOSIS — J449 Chronic obstructive pulmonary disease, unspecified: Secondary | ICD-10-CM | POA: Diagnosis not present

## 2023-12-15 DIAGNOSIS — F0390 Unspecified dementia without behavioral disturbance: Secondary | ICD-10-CM | POA: Diagnosis not present

## 2023-12-15 DIAGNOSIS — J449 Chronic obstructive pulmonary disease, unspecified: Secondary | ICD-10-CM | POA: Diagnosis not present

## 2023-12-15 DIAGNOSIS — L89629 Pressure ulcer of left heel, unspecified stage: Secondary | ICD-10-CM | POA: Diagnosis not present

## 2023-12-15 DIAGNOSIS — Z86711 Personal history of pulmonary embolism: Secondary | ICD-10-CM | POA: Diagnosis not present

## 2023-12-25 DIAGNOSIS — E039 Hypothyroidism, unspecified: Secondary | ICD-10-CM | POA: Diagnosis not present

## 2023-12-25 DIAGNOSIS — J449 Chronic obstructive pulmonary disease, unspecified: Secondary | ICD-10-CM | POA: Diagnosis not present

## 2023-12-25 DIAGNOSIS — F0390 Unspecified dementia without behavioral disturbance: Secondary | ICD-10-CM | POA: Diagnosis not present

## 2023-12-25 DIAGNOSIS — I4891 Unspecified atrial fibrillation: Secondary | ICD-10-CM | POA: Diagnosis not present

## 2023-12-26 DIAGNOSIS — E1129 Type 2 diabetes mellitus with other diabetic kidney complication: Secondary | ICD-10-CM | POA: Diagnosis not present

## 2024-01-31 DIAGNOSIS — J449 Chronic obstructive pulmonary disease, unspecified: Secondary | ICD-10-CM | POA: Diagnosis not present

## 2024-01-31 DIAGNOSIS — E039 Hypothyroidism, unspecified: Secondary | ICD-10-CM | POA: Diagnosis not present

## 2024-01-31 DIAGNOSIS — I4891 Unspecified atrial fibrillation: Secondary | ICD-10-CM | POA: Diagnosis not present
